# Patient Record
Sex: Female | Born: 1945
Health system: Southern US, Community
[De-identification: ages and names within clinical notes are randomized; demographics above are authoritative.]

## PROBLEM LIST (undated history)

## (undated) DIAGNOSIS — D509 Iron deficiency anemia, unspecified: Secondary | ICD-10-CM

## (undated) DIAGNOSIS — F419 Anxiety disorder, unspecified: Secondary | ICD-10-CM

## (undated) DIAGNOSIS — K589 Irritable bowel syndrome without diarrhea: Secondary | ICD-10-CM

## (undated) DIAGNOSIS — M1990 Unspecified osteoarthritis, unspecified site: Secondary | ICD-10-CM

## (undated) DIAGNOSIS — K219 Gastro-esophageal reflux disease without esophagitis: Secondary | ICD-10-CM

## (undated) DIAGNOSIS — Z960 Presence of urogenital implants: Secondary | ICD-10-CM

## (undated) DIAGNOSIS — I10 Essential (primary) hypertension: Secondary | ICD-10-CM

## (undated) DIAGNOSIS — Z8249 Family history of ischemic heart disease and other diseases of the circulatory system: Secondary | ICD-10-CM

## (undated) HISTORY — PX: TUBAL LIGATION: SHX77

## (undated) HISTORY — DX: Iron deficiency anemia, unspecified: D50.9

## (undated) HISTORY — DX: Gastro-esophageal reflux disease without esophagitis: K21.9

## (undated) HISTORY — DX: Essential (primary) hypertension: I10

## (undated) HISTORY — DX: Anxiety disorder, unspecified: F41.9

## (undated) HISTORY — PX: CHOLECYSTECTOMY: SHX55

## (undated) HISTORY — DX: Unspecified osteoarthritis, unspecified site: M19.90

## (undated) HISTORY — DX: Irritable bowel syndrome, unspecified: K58.9

## (undated) HISTORY — DX: Family history of ischemic heart disease and other diseases of the circulatory system: Z82.49

## (undated) HISTORY — PX: APPENDECTOMY: SHX54

---

## 1999-10-20 ENCOUNTER — Ambulatory Visit (HOSPITAL_COMMUNITY): Admission: RE | Admit: 1999-10-20 | Discharge: 1999-10-20 | Payer: Self-pay | Admitting: Internal Medicine

## 1999-10-20 ENCOUNTER — Encounter: Payer: Self-pay | Admitting: Internal Medicine

## 2000-06-07 ENCOUNTER — Encounter (INDEPENDENT_AMBULATORY_CARE_PROVIDER_SITE_OTHER): Payer: Self-pay

## 2000-06-07 ENCOUNTER — Other Ambulatory Visit: Admission: RE | Admit: 2000-06-07 | Discharge: 2000-06-07 | Payer: Self-pay | Admitting: Gastroenterology

## 2002-02-07 ENCOUNTER — Encounter: Admission: RE | Admit: 2002-02-07 | Discharge: 2002-02-07 | Payer: Self-pay | Admitting: Internal Medicine

## 2002-02-07 ENCOUNTER — Encounter: Payer: Self-pay | Admitting: Internal Medicine

## 2002-04-10 ENCOUNTER — Other Ambulatory Visit: Admission: RE | Admit: 2002-04-10 | Discharge: 2002-04-10 | Payer: Self-pay | Admitting: Obstetrics and Gynecology

## 2003-09-26 ENCOUNTER — Other Ambulatory Visit: Admission: RE | Admit: 2003-09-26 | Discharge: 2003-09-26 | Payer: Self-pay | Admitting: Obstetrics and Gynecology

## 2004-07-15 ENCOUNTER — Ambulatory Visit: Payer: Self-pay | Admitting: Gastroenterology

## 2005-07-16 ENCOUNTER — Ambulatory Visit: Payer: Self-pay | Admitting: Internal Medicine

## 2005-07-24 ENCOUNTER — Other Ambulatory Visit: Admission: RE | Admit: 2005-07-24 | Discharge: 2005-07-24 | Payer: Self-pay | Admitting: Obstetrics and Gynecology

## 2005-08-11 ENCOUNTER — Ambulatory Visit: Payer: Self-pay | Admitting: Gastroenterology

## 2005-08-21 ENCOUNTER — Ambulatory Visit: Payer: Self-pay | Admitting: Gastroenterology

## 2005-08-22 ENCOUNTER — Encounter: Payer: Self-pay | Admitting: Gastroenterology

## 2005-08-22 ENCOUNTER — Ambulatory Visit (HOSPITAL_COMMUNITY): Admission: RE | Admit: 2005-08-22 | Discharge: 2005-08-22 | Payer: Self-pay | Admitting: Radiology

## 2006-09-02 ENCOUNTER — Ambulatory Visit: Payer: Self-pay | Admitting: Gastroenterology

## 2006-09-02 LAB — CONVERTED CEMR LAB
Eosinophils Absolute: 0.1 10*3/uL (ref 0.0–0.6)
HCT: 35.9 % — ABNORMAL LOW (ref 36.0–46.0)
Hemoglobin: 12.5 g/dL (ref 12.0–15.0)
INR: 1 (ref 0.9–2.0)
Iron: 64 ug/dL (ref 42–145)
Lymphocytes Relative: 33.2 % (ref 12.0–46.0)
Monocytes Absolute: 0.6 10*3/uL (ref 0.2–0.7)
Neutro Abs: 3 10*3/uL (ref 1.4–7.7)
Neutrophils Relative %: 54.1 % (ref 43.0–77.0)
Prothrombin Time: 12.1 s (ref 10.0–14.0)
RBC: 4.24 M/uL (ref 3.87–5.11)
WBC: 5.6 10*3/uL (ref 4.5–10.5)

## 2006-09-24 ENCOUNTER — Ambulatory Visit: Payer: Self-pay | Admitting: Internal Medicine

## 2006-09-24 LAB — CONVERTED CEMR LAB
ALT: 19 units/L (ref 0–40)
Albumin: 3.7 g/dL (ref 3.5–5.2)
Alkaline Phosphatase: 61 units/L (ref 39–117)
BUN: 18 mg/dL (ref 6–23)
Bilirubin Urine: NEGATIVE
Bilirubin, Direct: 0.1 mg/dL (ref 0.0–0.3)
CO2: 31 meq/L (ref 19–32)
Chloride: 107 meq/L (ref 96–112)
Creatinine, Ser: 0.9 mg/dL (ref 0.4–1.2)
Eosinophils Absolute: 0.1 10*3/uL (ref 0.0–0.6)
HCT: 37.7 % (ref 36.0–46.0)
Hemoglobin: 12.7 g/dL (ref 12.0–15.0)
Lymphocytes Relative: 36.8 % (ref 12.0–46.0)
MCHC: 33.8 g/dL (ref 30.0–36.0)
MCV: 86.8 fL (ref 78.0–100.0)
Monocytes Absolute: 0.5 10*3/uL (ref 0.2–0.7)
Neutro Abs: 2.2 10*3/uL (ref 1.4–7.7)
Neutrophils Relative %: 45.4 % (ref 43.0–77.0)
Potassium: 4.9 meq/L (ref 3.5–5.1)
RBC / HPF: NONE SEEN
Sodium: 142 meq/L (ref 135–145)
TSH: 4.97 microintl units/mL (ref 0.35–5.50)
Total Bilirubin: 0.7 mg/dL (ref 0.3–1.2)
Total Protein: 6.9 g/dL (ref 6.0–8.3)
Triglycerides: 52 mg/dL (ref 0–149)
WBC: 4.7 10*3/uL (ref 4.5–10.5)

## 2006-09-28 ENCOUNTER — Ambulatory Visit: Payer: Self-pay | Admitting: Internal Medicine

## 2007-04-06 ENCOUNTER — Ambulatory Visit: Payer: Self-pay | Admitting: Internal Medicine

## 2007-04-06 ENCOUNTER — Encounter: Payer: Self-pay | Admitting: Internal Medicine

## 2007-04-06 DIAGNOSIS — R079 Chest pain, unspecified: Secondary | ICD-10-CM

## 2007-04-06 DIAGNOSIS — M1990 Unspecified osteoarthritis, unspecified site: Secondary | ICD-10-CM | POA: Insufficient documentation

## 2007-04-06 DIAGNOSIS — K589 Irritable bowel syndrome without diarrhea: Secondary | ICD-10-CM

## 2007-04-06 DIAGNOSIS — K219 Gastro-esophageal reflux disease without esophagitis: Secondary | ICD-10-CM | POA: Insufficient documentation

## 2007-04-06 DIAGNOSIS — M545 Low back pain, unspecified: Secondary | ICD-10-CM | POA: Insufficient documentation

## 2007-04-06 DIAGNOSIS — D509 Iron deficiency anemia, unspecified: Secondary | ICD-10-CM | POA: Insufficient documentation

## 2007-04-06 DIAGNOSIS — M179 Osteoarthritis of knee, unspecified: Secondary | ICD-10-CM | POA: Insufficient documentation

## 2007-04-06 DIAGNOSIS — D649 Anemia, unspecified: Secondary | ICD-10-CM

## 2007-04-06 DIAGNOSIS — M76899 Other specified enthesopathies of unspecified lower limb, excluding foot: Secondary | ICD-10-CM | POA: Insufficient documentation

## 2007-04-14 ENCOUNTER — Ambulatory Visit: Payer: Self-pay

## 2007-04-29 ENCOUNTER — Telehealth: Payer: Self-pay | Admitting: Internal Medicine

## 2007-05-18 ENCOUNTER — Telehealth: Payer: Self-pay | Admitting: Internal Medicine

## 2007-05-20 ENCOUNTER — Ambulatory Visit: Payer: Self-pay | Admitting: Internal Medicine

## 2007-05-20 DIAGNOSIS — M542 Cervicalgia: Secondary | ICD-10-CM

## 2007-05-20 DIAGNOSIS — M5412 Radiculopathy, cervical region: Secondary | ICD-10-CM | POA: Insufficient documentation

## 2007-05-20 DIAGNOSIS — R7309 Other abnormal glucose: Secondary | ICD-10-CM

## 2007-05-20 LAB — CONVERTED CEMR LAB
Chloride: 108 meq/L (ref 96–112)
GFR calc Af Amer: 82 mL/min
GFR calc non Af Amer: 68 mL/min
Glucose, Bld: 101 mg/dL — ABNORMAL HIGH (ref 70–99)
Hgb A1c MFr Bld: 5.7 % (ref 4.6–6.0)
Sodium: 141 meq/L (ref 135–145)
Vit D, 1,25-Dihydroxy: 37 (ref 30–89)

## 2007-05-24 ENCOUNTER — Ambulatory Visit: Payer: Self-pay | Admitting: Internal Medicine

## 2007-05-24 ENCOUNTER — Telehealth: Payer: Self-pay | Admitting: Internal Medicine

## 2007-05-24 DIAGNOSIS — I10 Essential (primary) hypertension: Secondary | ICD-10-CM | POA: Insufficient documentation

## 2007-05-24 DIAGNOSIS — R209 Unspecified disturbances of skin sensation: Secondary | ICD-10-CM

## 2007-06-10 ENCOUNTER — Telehealth: Payer: Self-pay | Admitting: Internal Medicine

## 2007-07-11 ENCOUNTER — Ambulatory Visit: Payer: Self-pay | Admitting: Internal Medicine

## 2007-07-11 DIAGNOSIS — R071 Chest pain on breathing: Secondary | ICD-10-CM

## 2007-08-22 ENCOUNTER — Telehealth: Payer: Self-pay | Admitting: Internal Medicine

## 2007-08-23 ENCOUNTER — Encounter: Payer: Self-pay | Admitting: Internal Medicine

## 2007-09-09 ENCOUNTER — Telehealth: Payer: Self-pay | Admitting: Internal Medicine

## 2007-09-09 DIAGNOSIS — E049 Nontoxic goiter, unspecified: Secondary | ICD-10-CM | POA: Insufficient documentation

## 2007-09-12 ENCOUNTER — Telehealth: Payer: Self-pay | Admitting: Internal Medicine

## 2007-09-15 ENCOUNTER — Ambulatory Visit: Payer: Self-pay | Admitting: Internal Medicine

## 2007-09-15 LAB — CONVERTED CEMR LAB
Chloride: 105 meq/L (ref 96–112)
Creatinine, Ser: 0.8 mg/dL (ref 0.4–1.2)
GFR calc Af Amer: 94 mL/min
GFR calc non Af Amer: 78 mL/min
Potassium: 4.4 meq/L (ref 3.5–5.1)
Sed Rate: 28 mm/hr — ABNORMAL HIGH (ref 0–25)
Sodium: 139 meq/L (ref 135–145)
Vitamin B-12: 508 pg/mL (ref 211–911)

## 2007-09-21 ENCOUNTER — Ambulatory Visit: Payer: Self-pay | Admitting: Internal Medicine

## 2007-09-21 ENCOUNTER — Ambulatory Visit: Payer: Self-pay | Admitting: Endocrinology

## 2007-09-21 DIAGNOSIS — R413 Other amnesia: Secondary | ICD-10-CM | POA: Insufficient documentation

## 2007-09-28 ENCOUNTER — Encounter: Admission: RE | Admit: 2007-09-28 | Discharge: 2007-09-28 | Payer: Self-pay | Admitting: Endocrinology

## 2007-09-28 ENCOUNTER — Telehealth: Payer: Self-pay | Admitting: Internal Medicine

## 2007-09-29 ENCOUNTER — Ambulatory Visit: Payer: Self-pay | Admitting: Internal Medicine

## 2007-09-29 LAB — CONVERTED CEMR LAB
Basophils Absolute: 0.1 10*3/uL (ref 0.0–0.1)
Eosinophils Relative: 2.2 % (ref 0.0–5.0)
HCT: 31.4 % — ABNORMAL LOW (ref 36.0–46.0)
Hemoglobin: 9.7 g/dL — ABNORMAL LOW (ref 12.0–15.0)
INR: 0.9 (ref 0.8–1.0)
MCHC: 30.9 g/dL (ref 30.0–36.0)
MCV: 83.3 fL (ref 78.0–100.0)
Monocytes Relative: 9.3 % (ref 3.0–12.0)
Platelets: 396 10*3/uL (ref 150–400)
RDW: 15.8 % — ABNORMAL HIGH (ref 11.5–14.6)

## 2007-09-30 ENCOUNTER — Ambulatory Visit: Payer: Self-pay | Admitting: Internal Medicine

## 2007-09-30 DIAGNOSIS — R21 Rash and other nonspecific skin eruption: Secondary | ICD-10-CM | POA: Insufficient documentation

## 2007-10-13 ENCOUNTER — Ambulatory Visit: Payer: Self-pay | Admitting: Gastroenterology

## 2007-10-14 ENCOUNTER — Encounter: Payer: Self-pay | Admitting: Internal Medicine

## 2007-10-21 ENCOUNTER — Encounter: Payer: Self-pay | Admitting: Internal Medicine

## 2007-11-07 ENCOUNTER — Ambulatory Visit: Payer: Self-pay | Admitting: Internal Medicine

## 2007-11-09 ENCOUNTER — Ambulatory Visit: Payer: Self-pay | Admitting: Internal Medicine

## 2007-11-28 ENCOUNTER — Ambulatory Visit: Payer: Self-pay | Admitting: Internal Medicine

## 2007-11-29 LAB — CONVERTED CEMR LAB
Basophils Absolute: 0 10*3/uL (ref 0.0–0.1)
Basophils Relative: 0.3 % (ref 0.0–1.0)
CO2: 29 meq/L (ref 19–32)
Calcium: 9.2 mg/dL (ref 8.4–10.5)
Eosinophils Relative: 2.1 % (ref 0.0–5.0)
GFR calc Af Amer: 82 mL/min
Hemoglobin: 13 g/dL (ref 12.0–15.0)
Iron: 89 ug/dL (ref 42–145)
MCHC: 33.3 g/dL (ref 30.0–36.0)
MCV: 86.2 fL (ref 78.0–100.0)
Neutro Abs: 2.8 10*3/uL (ref 1.4–7.7)
Potassium: 4.7 meq/L (ref 3.5–5.1)
Saturation Ratios: 21.5 % (ref 20.0–50.0)
Sodium: 139 meq/L (ref 135–145)
WBC: 5.3 10*3/uL (ref 4.5–10.5)

## 2008-03-20 ENCOUNTER — Telehealth: Payer: Self-pay | Admitting: Internal Medicine

## 2008-03-21 ENCOUNTER — Ambulatory Visit: Payer: Self-pay | Admitting: Internal Medicine

## 2008-04-10 ENCOUNTER — Telehealth: Payer: Self-pay | Admitting: Gastroenterology

## 2008-06-19 ENCOUNTER — Ambulatory Visit: Payer: Self-pay | Admitting: Internal Medicine

## 2008-10-08 ENCOUNTER — Ambulatory Visit: Payer: Self-pay | Admitting: Internal Medicine

## 2008-10-08 LAB — CONVERTED CEMR LAB
BUN: 16 mg/dL (ref 6–23)
Bilirubin, Direct: 0.1 mg/dL (ref 0.0–0.3)
Calcium: 8.8 mg/dL (ref 8.4–10.5)
Chloride: 109 meq/L (ref 96–112)
GFR calc non Af Amer: 67.46 mL/min (ref >60)
Sodium: 145 meq/L (ref 135–145)
TSH: 2.23 microintl units/mL (ref 0.35–5.50)
Total Bilirubin: 0.5 mg/dL (ref 0.3–1.2)

## 2008-10-15 ENCOUNTER — Ambulatory Visit: Payer: Self-pay | Admitting: Internal Medicine

## 2009-01-21 ENCOUNTER — Telehealth: Payer: Self-pay | Admitting: Internal Medicine

## 2009-01-23 ENCOUNTER — Telehealth: Payer: Self-pay | Admitting: Internal Medicine

## 2009-02-07 ENCOUNTER — Encounter: Payer: Self-pay | Admitting: Internal Medicine

## 2009-02-21 ENCOUNTER — Ambulatory Visit: Payer: Self-pay | Admitting: Internal Medicine

## 2009-02-24 DIAGNOSIS — F419 Anxiety disorder, unspecified: Secondary | ICD-10-CM | POA: Insufficient documentation

## 2009-02-24 DIAGNOSIS — F411 Generalized anxiety disorder: Secondary | ICD-10-CM

## 2009-04-09 ENCOUNTER — Ambulatory Visit: Payer: Self-pay | Admitting: Gastroenterology

## 2009-04-09 DIAGNOSIS — R0789 Other chest pain: Secondary | ICD-10-CM

## 2009-04-09 DIAGNOSIS — R1013 Epigastric pain: Secondary | ICD-10-CM

## 2009-04-09 DIAGNOSIS — R197 Diarrhea, unspecified: Secondary | ICD-10-CM

## 2009-04-09 LAB — CONVERTED CEMR LAB
ALT: 37 units/L — ABNORMAL HIGH (ref 0–35)
AST: 30 units/L (ref 0–37)
Albumin: 3.8 g/dL (ref 3.5–5.2)
Alkaline Phosphatase: 47 units/L (ref 39–117)
BUN: 11 mg/dL (ref 6–23)
Basophils Absolute: 0 10*3/uL (ref 0.0–0.1)
Chloride: 101 meq/L (ref 96–112)
Eosinophils Absolute: 0.1 10*3/uL (ref 0.0–0.7)
GFR calc non Af Amer: 77.15 mL/min (ref >60)
HCT: 35 % — ABNORMAL LOW (ref 36.0–46.0)
Hemoglobin: 11.9 g/dL — ABNORMAL LOW (ref 12.0–15.0)
Lymphs Abs: 1.3 10*3/uL (ref 0.7–4.0)
MCHC: 33.8 g/dL (ref 30.0–36.0)
MCV: 95.3 fL (ref 78.0–100.0)
Monocytes Absolute: 0.7 10*3/uL (ref 0.1–1.0)
Neutrophils Relative %: 69.7 % (ref 43.0–77.0)
Sed Rate: 14 mm/hr (ref 0–22)
Sodium: 141 meq/L (ref 135–145)
TSH: 3.52 microintl units/mL (ref 0.35–5.50)
Total Bilirubin: 0.5 mg/dL (ref 0.3–1.2)

## 2009-04-10 ENCOUNTER — Encounter: Payer: Self-pay | Admitting: Gastroenterology

## 2009-04-10 ENCOUNTER — Ambulatory Visit (HOSPITAL_COMMUNITY): Admission: RE | Admit: 2009-04-10 | Discharge: 2009-04-10 | Payer: Self-pay | Admitting: Gastroenterology

## 2009-04-10 ENCOUNTER — Ambulatory Visit: Payer: Self-pay | Admitting: Gastroenterology

## 2009-04-10 LAB — CONVERTED CEMR LAB: UREASE: NEGATIVE

## 2009-04-11 ENCOUNTER — Telehealth: Payer: Self-pay | Admitting: Gastroenterology

## 2009-04-11 DIAGNOSIS — R935 Abnormal findings on diagnostic imaging of other abdominal regions, including retroperitoneum: Secondary | ICD-10-CM

## 2009-04-12 ENCOUNTER — Telehealth: Payer: Self-pay | Admitting: Gastroenterology

## 2009-04-15 ENCOUNTER — Encounter: Payer: Self-pay | Admitting: Gastroenterology

## 2009-04-16 ENCOUNTER — Ambulatory Visit (HOSPITAL_COMMUNITY): Admission: RE | Admit: 2009-04-16 | Discharge: 2009-04-16 | Payer: Self-pay | Admitting: Gastroenterology

## 2009-04-19 ENCOUNTER — Telehealth: Payer: Self-pay | Admitting: Gastroenterology

## 2009-05-28 ENCOUNTER — Ambulatory Visit: Payer: Self-pay | Admitting: Gastroenterology

## 2009-05-29 ENCOUNTER — Ambulatory Visit: Payer: Self-pay | Admitting: Internal Medicine

## 2009-06-29 HISTORY — PX: CERVICAL FUSION: SHX112

## 2009-07-01 ENCOUNTER — Telehealth: Payer: Self-pay | Admitting: Gastroenterology

## 2009-07-02 ENCOUNTER — Ambulatory Visit: Payer: Self-pay | Admitting: Internal Medicine

## 2009-07-02 DIAGNOSIS — R279 Unspecified lack of coordination: Secondary | ICD-10-CM | POA: Insufficient documentation

## 2009-07-02 DIAGNOSIS — R5383 Other fatigue: Secondary | ICD-10-CM

## 2009-07-02 DIAGNOSIS — Z87891 Personal history of nicotine dependence: Secondary | ICD-10-CM | POA: Insufficient documentation

## 2009-07-02 DIAGNOSIS — R5381 Other malaise: Secondary | ICD-10-CM | POA: Insufficient documentation

## 2009-07-02 LAB — CONVERTED CEMR LAB
Albumin ELP: 60.5 % (ref 55.8–66.1)
Beta Globulin: 7.1 % (ref 4.7–7.2)
Gamma Globulin: 14.8 % (ref 11.1–18.8)
Vit D, 25-Hydroxy: 42 ng/mL (ref 30–89)

## 2009-07-03 LAB — CONVERTED CEMR LAB
ALT: 23 units/L (ref 0–35)
AST: 28 units/L (ref 0–37)
Albumin: 3.9 g/dL (ref 3.5–5.2)
Alkaline Phosphatase: 57 units/L (ref 39–117)
BUN: 15 mg/dL (ref 6–23)
Basophils Absolute: 0.1 10*3/uL (ref 0.0–0.1)
Basophils Relative: 1.4 % (ref 0.0–3.0)
Bilirubin Urine: NEGATIVE
Bilirubin, Direct: 0.1 mg/dL (ref 0.0–0.3)
CO2: 29 meq/L (ref 19–32)
CRP, High Sensitivity: 2.5 (ref 0.00–5.00)
Calcium: 9 mg/dL (ref 8.4–10.5)
Chloride: 105 meq/L (ref 96–112)
Creatinine, Ser: 0.8 mg/dL (ref 0.4–1.2)
Eosinophils Absolute: 0.1 10*3/uL (ref 0.0–0.7)
Eosinophils Relative: 2.4 % (ref 0.0–5.0)
GFR calc non Af Amer: 77.1 mL/min (ref >60)
Glucose, Bld: 101 mg/dL — ABNORMAL HIGH (ref 70–99)
HCT: 40.1 % (ref 36.0–46.0)
Hemoglobin, Urine: NEGATIVE
Hemoglobin: 13.2 g/dL (ref 12.0–15.0)
Ketones, ur: NEGATIVE mg/dL
Leukocytes, UA: NEGATIVE
Lymphocytes Relative: 33.2 % (ref 12.0–46.0)
Lymphs Abs: 1.5 10*3/uL (ref 0.7–4.0)
MCHC: 32.8 g/dL (ref 30.0–36.0)
MCV: 93.9 fL (ref 78.0–100.0)
Monocytes Absolute: 0.4 10*3/uL (ref 0.1–1.0)
Monocytes Relative: 8 % (ref 3.0–12.0)
Neutro Abs: 2.5 10*3/uL (ref 1.4–7.7)
Neutrophils Relative %: 55 % (ref 43.0–77.0)
Nitrite: NEGATIVE
Platelets: 267 10*3/uL (ref 150.0–400.0)
Potassium: 4.3 meq/L (ref 3.5–5.1)
RBC: 4.27 M/uL (ref 3.87–5.11)
RDW: 14.1 % (ref 11.5–14.6)
Sed Rate: 13 mm/hr (ref 0–22)
Sodium: 141 meq/L (ref 135–145)
Specific Gravity, Urine: 1.02 (ref 1.000–1.030)
TSH: 3.73 microintl units/mL (ref 0.35–5.50)
Total Bilirubin: 0.7 mg/dL (ref 0.3–1.2)
Total Protein, Urine: NEGATIVE mg/dL
Total Protein: 7.6 g/dL (ref 6.0–8.3)
Urine Glucose: NEGATIVE mg/dL
Urobilinogen, UA: 0.2 (ref 0.0–1.0)
Vitamin B-12: 639 pg/mL (ref 211–911)
WBC: 4.6 10*3/uL (ref 4.5–10.5)
pH: 5.5 (ref 5.0–8.0)

## 2009-07-15 ENCOUNTER — Telehealth: Payer: Self-pay | Admitting: Internal Medicine

## 2009-07-15 ENCOUNTER — Ambulatory Visit: Payer: Self-pay | Admitting: Internal Medicine

## 2009-07-15 DIAGNOSIS — R609 Edema, unspecified: Secondary | ICD-10-CM

## 2009-07-15 LAB — CONVERTED CEMR LAB
Cortisol, Plasma: 8.9 ug/dL
Total CK: 133 units/L (ref 7–177)

## 2009-07-25 ENCOUNTER — Ambulatory Visit (HOSPITAL_COMMUNITY): Admission: RE | Admit: 2009-07-25 | Discharge: 2009-07-25 | Payer: Self-pay | Admitting: Internal Medicine

## 2009-07-25 ENCOUNTER — Telehealth: Payer: Self-pay | Admitting: Internal Medicine

## 2009-07-26 ENCOUNTER — Telehealth: Payer: Self-pay | Admitting: Internal Medicine

## 2009-07-26 DIAGNOSIS — G959 Disease of spinal cord, unspecified: Secondary | ICD-10-CM | POA: Insufficient documentation

## 2009-07-30 ENCOUNTER — Encounter (INDEPENDENT_AMBULATORY_CARE_PROVIDER_SITE_OTHER): Payer: Self-pay | Admitting: *Deleted

## 2009-07-31 ENCOUNTER — Encounter: Admission: RE | Admit: 2009-07-31 | Discharge: 2009-07-31 | Payer: Self-pay | Admitting: Orthopedic Surgery

## 2009-08-06 ENCOUNTER — Encounter (INDEPENDENT_AMBULATORY_CARE_PROVIDER_SITE_OTHER): Payer: Self-pay | Admitting: Orthopedic Surgery

## 2009-08-06 ENCOUNTER — Inpatient Hospital Stay (HOSPITAL_COMMUNITY): Admission: RE | Admit: 2009-08-06 | Discharge: 2009-08-07 | Payer: Self-pay | Admitting: Orthopedic Surgery

## 2009-08-12 ENCOUNTER — Telehealth: Payer: Self-pay | Admitting: Internal Medicine

## 2009-08-27 ENCOUNTER — Ambulatory Visit: Payer: Self-pay | Admitting: Internal Medicine

## 2009-10-10 ENCOUNTER — Telehealth: Payer: Self-pay | Admitting: Internal Medicine

## 2009-12-05 ENCOUNTER — Ambulatory Visit: Payer: Self-pay | Admitting: Internal Medicine

## 2009-12-05 LAB — CONVERTED CEMR LAB
Basophils Absolute: 0 10*3/uL (ref 0.0–0.1)
Basophils Relative: 0.7 % (ref 0.0–3.0)
Creatinine, Ser: 0.9 mg/dL (ref 0.4–1.2)
Eosinophils Absolute: 0.2 10*3/uL (ref 0.0–0.7)
Eosinophils Relative: 3.3 % (ref 0.0–5.0)
GFR calc non Af Amer: 64.71 mL/min (ref >60)
HCT: 38.5 % (ref 36.0–46.0)
Hemoglobin: 13.3 g/dL (ref 12.0–15.0)
MCHC: 34.4 g/dL (ref 30.0–36.0)
Monocytes Absolute: 0.5 10*3/uL (ref 0.1–1.0)
Neutrophils Relative %: 54.6 % (ref 43.0–77.0)
Platelets: 246 10*3/uL (ref 150.0–400.0)
RDW: 13.9 % (ref 11.5–14.6)
Sodium: 142 meq/L (ref 135–145)
WBC: 4.7 10*3/uL (ref 4.5–10.5)

## 2009-12-09 ENCOUNTER — Ambulatory Visit: Payer: Self-pay | Admitting: Internal Medicine

## 2010-04-17 ENCOUNTER — Telehealth: Payer: Self-pay | Admitting: Internal Medicine

## 2010-05-06 ENCOUNTER — Ambulatory Visit: Payer: Self-pay | Admitting: Internal Medicine

## 2010-07-01 ENCOUNTER — Ambulatory Visit
Admission: RE | Admit: 2010-07-01 | Discharge: 2010-07-01 | Payer: Self-pay | Source: Home / Self Care | Attending: Internal Medicine | Admitting: Internal Medicine

## 2010-07-01 LAB — CONVERTED CEMR LAB
AST: 24 units/L (ref 0–37)
Albumin: 3.8 g/dL (ref 3.5–5.2)
Alkaline Phosphatase: 56 units/L (ref 39–117)
Basophils Absolute: 0 10*3/uL (ref 0.0–0.1)
Bilirubin, Direct: 0.1 mg/dL (ref 0.0–0.3)
Blood, UA: NEGATIVE
CO2: 28 meq/L (ref 19–32)
Chloride: 105 meq/L (ref 96–112)
Eosinophils Absolute: 0.1 10*3/uL (ref 0.0–0.7)
GFR calc non Af Amer: 79.13 mL/min (ref >60.00)
Glucose, Bld: 90 mg/dL (ref 70–99)
HCT: 39 % (ref 36.0–46.0)
HDL: 63.8 mg/dL (ref >39.00)
LDL Cholesterol: 110 mg/dL — ABNORMAL HIGH (ref 0–99)
Lymphocytes Relative: 33.4 % (ref 12.0–46.0)
Lymphs Abs: 1.7 10*3/uL (ref 0.7–4.0)
MCHC: 34.3 g/dL (ref 30.0–36.0)
MCV: 93.5 fL (ref 78.0–100.0)
Monocytes Absolute: 0.4 10*3/uL (ref 0.1–1.0)
Nitrite: NEGATIVE
Platelets: 279 10*3/uL (ref 150.0–400.0)
Potassium: 4.6 meq/L (ref 3.5–5.1)
Sodium: 141 meq/L (ref 135–145)
Total Protein: 6.5 g/dL (ref 6.0–8.3)
Urine Glucose: NEGATIVE mg/dL
Urobilinogen, UA: 0.2 (ref 0.0–1.0)
VLDL: 10.4 mg/dL (ref 0.0–40.0)
Vitamin B-12: 600 pg/mL (ref 211–911)

## 2010-07-07 ENCOUNTER — Encounter: Payer: Self-pay | Admitting: Internal Medicine

## 2010-07-08 ENCOUNTER — Encounter: Payer: Self-pay | Admitting: Internal Medicine

## 2010-07-08 ENCOUNTER — Ambulatory Visit
Admission: RE | Admit: 2010-07-08 | Discharge: 2010-07-08 | Payer: Self-pay | Source: Home / Self Care | Attending: Internal Medicine | Admitting: Internal Medicine

## 2010-07-20 ENCOUNTER — Encounter: Payer: Self-pay | Admitting: Gastroenterology

## 2010-07-21 ENCOUNTER — Encounter: Payer: Self-pay | Admitting: Endocrinology

## 2010-07-29 NOTE — Progress Notes (Signed)
Summary: [facility] ?  Phone Note Other Incoming   Caller: [facility] Summary of Call: Pt is currently taking Enalapril 5mg  1 tab two times a day . Is it ok for her to chage to Enalapril 10mg  once a day. This would be a cheaper copay for the pt. Please Advise Initial call taken by: Ami Bullins CMA,  October 10, 2009 11:41 AM  Follow-up for Phone Call        OK w/me if it would work for her Follow-up by: Tresa Garter MD,  October 10, 2009 3:17 PM  Additional Follow-up for Phone Call Additional follow up Details #1::        Spoke w/pt, she takes 5mg  once daily. Only was on two times a day for short time after surgery. She wants rx only for 5mg  once daily. [facility] Additional Follow-up by: Lamar Sprinkles, CMA,  October 10, 2009 6:15 PM    Prescriptions: ENALAPRIL MALEATE 5 MG TABS (ENALAPRIL MALEATE) 1 by mouth qd  #90 x 1   Entered by:   Lamar Sprinkles, CMA   Authorized by:   Tresa Garter MD   Signed by:   Lamar Sprinkles, CMA on 10/10/2009   Method used:   Faxed to ...       [facility] (mail[facility]order)             ,          Ph: [phone]       Fax: [phone]   RxID:   [id number]

## 2010-07-29 NOTE — Letter (Signed)
Summary: [facility] Consult Scheduled Letter  [facility]  [address] [place], [place] [address]   Phone: [phone]  Fax: [phone]      07/30/2009 MRN: [id number]  Haley Armstrong [address] [place], [place]  [address]    Dear [name],      We have scheduled an appointment for you.  At the recommendation of Dr.plotnikov, we have scheduled you a consult with Dr Vickey Huger on Febuary 25,2011  at 12:00. Their phone number is [phone].If this appointment day and time is not convenient for you, please feel free to call the office of the doctor you are being referred to at the number listed above and reschedule the appointment.  ***Please arrive 30 Minutes prior to your appointment time *** *Due to an increase in missed appt for both and new and return patients , a $25 rescheduling fee will be charged for appointments that have not been cancelled within 24hrs of the scheduling time.In order to reschedule the appointmentthe fee must be paid. if you are unable to keep your scheduled appointment for any reason , please contact [facility] at [phone]).  If you have had an MRI and CT Scan .Marland KitchenPlease bring the films/Cd to the Appointment, otherwise the Appointment may have to be rescheduled .   [facility]  [address], [address],  [place], [place] [address]   Thank you,  Patient Care Coordinator [facility]

## 2010-07-29 NOTE — Assessment & Plan Note (Signed)
Summary: 3 MONTH FOLLOW UP-LB   Vital Signs:  Patient profile:   65 year old female Height:      67 inches Weight:      152 pounds BMI:     23.89 O2 Sat:      98 % on Room air Temp:     97.7 degrees F oral Pulse rate:   84 / minute BP sitting:   150 / 70  (left arm) Cuff size:   regular  Vitals Entered By: Lucious Groves (December 09, 2009 9:16 AM)  O2 Flow:  Room air CC: 3 mo f/u./kb Is Patient Diabetic? No Pain Assessment Patient in pain? no      Comments Patient notes that she does not use Restasis and Dexamethasone./kb   Primary Care Provider:  Jacinta Shoe, MD   CC:  3 mo f/u./kb.  History of Present Illness: The patient presents for a follow up of hypertension, ataxia, GERD, hyperlipidemia   Current Medications (verified): 1)  Amlodipine Besylate 5 Mg Tabs (Amlodipine Besylate) .Marland Kitchen.. 1 By Mouth Qd 2)  Enalapril Maleate 5 Mg Tabs (Enalapril Maleate) .Marland Kitchen.. 1 By Mouth Qd 3)  Levbid 0.375 Mg Tb12 (Hyoscyamine Sulfate) .... Take 1 Tablet By Mouth Every Morning 4)  Levsin/sl 0.125 Mg Subl (Hyoscyamine Sulfate) .... One Tablet Under Tounge As Needed 5)  Skelaxin 800 Mg  Tabs (Metaxalone) .... Three Times A Day As Needed 6)  Fluocinonide-E 0.05 %  Crea (Fluocinonide Emulsified Base) .... As Directed 7)  Vitamin D3 1000 Unit  Tabs (Cholecalciferol) .... One Tablet By Mouth Once Daily 8)  Protonix 40 Mg  Tbec (Pantoprazole Sodium) .... Once Daily 9)  Lorazepam 0.5 Mg Tabs (Lorazepam) .Marland Kitchen.. 1 By Mouth Two Times A Day As Needed Anxiety 10)  Fish Oil   Oil (Fish Oil) .... One Tablet By Mouth Once Daily 11)  Citrucel 500 Mg Tabs (Methylcellulose (Laxative)) .... One Tablet By Mouth Once Daily 12)  Multivitamins   Tabs (Multiple Vitamin) .... One Tablet By Mouth Once Daily 13)  Viactiv Multi-Vitamin  Chew (Multiple Vitamins-Calcium) .... One Tablet By Mouth Two Times A Day 14)  Gi Cocktail- Equal Parts Maalox, Donnatal, Xylocaine .Marland Kitchen.. 1 Tbsp Q 6-7 Hrs As Needed 15)  Restasis 0.05  % Emul (Cyclosporine) .Marland Kitchen.. 1 Drop Both Eyes Two Times A Day 16)  Dexamethasone 4 Mg Tabs (Dexamethasone) .... 8 Mg Two Times A Day X 2 D, 4 Mg Two Times A Day X 7 D, 4 Mg Once Daily X 7 D Then Stop 17)  Epidrin 325-65-100 Mg Caps (Apap-Isometheptene-Dichloral) .Marland Kitchen.. 1 By Mouth Two Times A Day As Needed Migraine 18)  Aspir-Low 81 Mg Tbec (Aspirin) .... Two Times A Day  Allergies (verified): No Known Drug Allergies  Past History:  Past Medical History: Last updated: 02/21/2009 GERD Osteoarthritis Anemia-NOS Anemia-iron deficiency Hypertension Blood donor Colonosc. 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         BP is ok at home  Physical Exam  General:  Well-developed,well-nourished,in no acute distress; alert,appropriate and cooperative throughout examination Nose:  External nasal examination shows no deformity or inflammation. Nasal mucosa are pink and moist without lesions or exudates. Mouth:  Oral mucosa and oropharynx without lesions or exudates.  Teeth in good  repair. Lungs:  Normal respiratory effort, chest expands symmetrically. Lungs are clear to auscultation, no crackles or wheezes. Heart:  Normal rate and regular rhythm. S1 and S2 normal without gallop, murmur, click, rub or other extra sounds. Abdomen:  Bowel sounds positive,abdomen soft and non-tender without masses, organomegaly or hernias noted. Msk:  No deformity or scoliosis noted of thoracic or lumbar spine.   Extremities:  No clubbing, cyanosis, edema, or deformity noted with normal full range of motion of all joints.   Neurologic:  5-/5 UE large muscles  and thighs and  B grip Babinski equivocal B, unable to do heel-to-toe, ataxic gait. Romberg  neg Skin:  Intact without suspicious lesions or rashes Psych:  Cognition and judgment appear intact. Alert and cooperative with normal attention span and concentration. No apparent delusions, illusions, hallucinations   Impression & Recommendations:  Problem # 1:  SPINAL CORD COMPRESSION (ICD-336.9) Assessment Improved S/p surgery - doing well. Occ heavy lifting at work - she was discouraged from it.  Problem # 2:  ATAXIA (ICD-781.3) due to #1 Assessment: Improved  Problem # 3:  MEMORY LOSS (ICD-780.93) - minimal Assessment: Improved Discussed options/testing  Problem # 4:  HYPERTENSION (ICD-401.9) Assessment: Comment Only  Her updated medication list for this problem includes:    Amlodipine Besylate 5 Mg Tabs (Amlodipine besylate) .Marland Kitchen... 1 by mouth qd    Enalapril Maleate 5 Mg Tabs (Enalapril maleate) .Marland Kitchen... 1 by mouth qd  BP today: 150/70 Prior BP: 124/70 (08/27/2009)  Labs Reviewed: K+: 4.9 (12/05/2009) Creat: : 0.9 (12/05/2009)   Chol: 175 (09/24/2006)   HDL: 59.6 (09/24/2006)   LDL: 105 (09/24/2006)   TG: 52 (09/24/2006)  Complete Medication List: 1)  Amlodipine Besylate 5 Mg Tabs (Amlodipine besylate) .Marland Kitchen.. 1 by mouth qd 2)  Enalapril Maleate 5 Mg Tabs (Enalapril maleate) .Marland Kitchen.. 1 by mouth qd 3)  Levbid 0.375 Mg Tb12 (Hyoscyamine sulfate) .... Take 1 tablet by mouth every morning 4)  Levsin/sl 0.125 Mg Subl (Hyoscyamine sulfate) .... One tablet under tounge as needed 5)  Skelaxin 800 Mg Tabs (Metaxalone) .... Three times a day as needed 6)  Fluocinonide-e 0.05 % Crea (Fluocinonide emulsified base) .... As directed 7)  Vitamin D3 1000 Unit Tabs (Cholecalciferol) .... One tablet by mouth once daily 8)  Protonix 40 Mg Tbec (Pantoprazole sodium) .... Once daily 9)  Lorazepam 0.5 Mg Tabs (Lorazepam) .Marland Kitchen.. 1 by mouth two times a day as needed anxiety 10)  Fish Oil Oil (Fish oil) .... One tablet by mouth once daily 11)  Citrucel 500 Mg Tabs (Methylcellulose (laxative)) .... One  tablet by mouth once daily 12)  Multivitamins Tabs (Multiple vitamin) .... One tablet by mouth once daily 13)  Viactiv Multi-vitamin Chew (Multiple vitamins-calcium) .... One tablet by mouth two times a day 14)  Restasis 0.05 % Emul (Cyclosporine) .Marland Kitchen.. 1 drop both eyes two times a day 15)  Epidrin 325-65-100 Mg Caps (Apap-isometheptene-dichloral) .Marland Kitchen.. 1 by mouth two times a day as needed migraine 16)  Aspir-low 81 Mg Tbec (Aspirin) .... Two times a day 17)  Pennsaid 1.5 % Soln (Diclofenac sodium) .... 3-5 gtt on skin three times a day for pain  Patient Instructions: 1)  Please schedule a follow-up appointment in 6 months well w/labs v70.0 2)  and B12 782.0. Prescriptions: PENNSAID 1.5 % SOLN (DICLOFENAC SODIUM) 3-5 gtt on skin three times a day for pain  #1 x 3   Entered and Authorized by:   Tresa Garter MD   Signed by:  Tresa Garter MD on 12/09/2009   Method used:   Print then Give to Patient   RxID:   [id number]

## 2010-07-29 NOTE — Progress Notes (Signed)
Summary: lorazepam refill/plot pt  Phone Note Refill Request Message from:  Fax from Pharmacy on April 17, 2010 3:11 PM  Refills Requested: Medication #1:  LORAZEPAM 0.5 MG TABS 1 by mouth two times a day as needed anxiety   Supply Requested: 1 month   Notes: 1tab po bid prn   Is this ok to refill for pt? [facility]  Initial call taken by: Rock Nephew CMA,  April 17, 2010 3:12 PM  Follow-up for Phone Call        ok Follow-up by: Etta Grandchild MD,  April 17, 2010 3:34 PM    Prescriptions: LORAZEPAM 0.5 MG TABS (LORAZEPAM) 1 by mouth two times a day as needed anxiety  #60 x 3   Entered by:   Rock Nephew CMA   Authorized by:   Jacques Navy MD   Signed by:   Rock Nephew CMA on 04/18/2010   Method used:   Telephoned to ...       [facility] (retail)       [address] [place]       [address]       [place], [place]  [address]       Ph: [phone]       Fax: [phone]   RxID:   [id number]

## 2010-07-29 NOTE — Progress Notes (Signed)
  Phone Note Other Incoming   Caller: me Summary of Call: Informed of MRI results - doing about the same - she is at work Initial call taken by: Tresa Garter MD,  July 26, 2009 11:49 AM  Follow-up for Phone Call        Decadron To ER if worse. [facility] on Mon Follow-up by: Tresa Garter MD,  July 26, 2009 11:50 AM  New Problems: SPINAL CORD COMPRESSION (ICD-336.9)   New Problems: SPINAL CORD COMPRESSION (ICD-336.9) New/Updated Medications: DEXAMETHASONE 4 MG TABS (DEXAMETHASONE) 8 mg two times a day x 2 d, 4 mg two times a day x 7 d, 4 mg once daily x 7 d then stop EPIDRIN 325-65-100 MG CAPS (APAP-ISOMETHEPTENE-DICHLORAL) 1 by mouth two times a day as needed migraine  Appended Document:  Per patient request faxed a copy of report to her at Fax: [phone].

## 2010-07-29 NOTE — Progress Notes (Signed)
Summary: Midrin refill request  Phone Note Refill Request   Refills Requested: Medication #1:  Midrin 65-100-325mg    Supply Requested: 60   Notes: 1 po every hour prn for migraine (no more than 5 per day) *not on patient med list currently  Initial call taken by: Lucious Groves,  July 25, 2009 10:46 AM  Follow-up for Phone Call        OK x3 Follow-up by: Tresa Garter MD,  July 25, 2009 12:58 PM  Additional Follow-up for Phone Call Additional follow up Details #1::        Cant find med in EMR please help.....................Marland KitchenLamar Sprinkles, CMA  July 25, 2009 5:09 PM     Additional Follow-up for Phone Call Additional follow up Details #2::    OK a substitute pls Follow-up by: Tresa Garter MD,  July 26, 2009 7:40 AM  Additional Follow-up for Phone Call Additional follow up Details #3:: Details for Additional Follow-up Action Taken: Done - Epidrin Additional Follow-up by: Tresa Garter MD,  July 26, 2009 11:53 AM  Prescriptions: EPIDRIN 325-65-100 MG CAPS (APAP-ISOMETHEPTENE-DICHLORAL) 1 by mouth two times a day as needed migraine  #60 x 5   Entered by:   Lamar Sprinkles, CMA   Authorized by:   Tresa Garter MD   Signed by:   Lamar Sprinkles, CMA on 07/26/2009   Method used:   Telephoned to ...       [facility] (retail)       [address] [place]       [address]       [place], [place]  [address]       Ph: [phone]       Fax: [phone]   RxID:   [id number]  DR called in rx, above is to document in emr, pt is aware...........Lamar Sprinkles

## 2010-07-29 NOTE — Progress Notes (Signed)
Summary: RESULTS  Phone Note Call from Patient   Summary of Call: Pt had MRI yesterday at the hospital and was told by radiologist that she had a ruptured disc. Please advise.  Initial call taken by: Lamar Sprinkles, CMA,  July 26, 2009 8:24 AM  Follow-up for Phone Call        called - see oyher note Follow-up by: Tresa Garter MD,  July 26, 2009 11:54 AM

## 2010-07-29 NOTE — Assessment & Plan Note (Signed)
Summary: flu shot/plot/cd   Nurse Visit   Vital Signs:  Patient profile:   65 year old female Temp:     96.7 degrees F oral  Vitals Entered By: Lanier Prude, CMA(AAMA) (May 06, 2010 9:25 AM)  Allergies: No Known Drug Allergies  Orders Added: 1)  Admin 1st Vaccine [90471] 2)  Flu Vaccine 32yrs + [[address]]           Flu Vaccine Consent Questions     Do you have a history of severe allergic reactions to this vaccine? no    Any prior history of allergic reactions to egg and/or gelatin? no    Do you have a sensitivity to the preservative Thimersol? no    Do you have a past history of Guillan-Barre Syndrome? no    Do you currently have an acute febrile illness? no    Have you ever had a severe reaction to latex? no    Vaccine information given and explained to patient? yes    Are you currently pregnant? no    Lot Number:[id number]   Exp Date:12/27/2010   Site Given  Left Deltoid IM Lanier Prude, [facility])  May 06, 2010 9:26 AM

## 2010-07-29 NOTE — Assessment & Plan Note (Signed)
Summary: 3 mo rov /nws  $50   Vital Signs:  Patient profile:   65 year old female Weight:      151 pounds Temp:     97.7 degrees F oral Pulse rate:   78 / minute BP sitting:   136 / 76  (left arm)  Vitals Entered By: Tora Perches (July 02, 2009 9:31 AM) CC: f/u Is Patient Diabetic? No   Primary Care Provider:  Jacinta Shoe, MD   CC:  f/u.  History of Present Illness: C/o numbness in B arms x weeks. C/o weakness and clumsiness in LE. Feels "disjointed" worse lately x 1 mo. No falls. Saw Dr Jarold Motto and had an MRI for a kidney lesion  Preventive Screening-Counseling & Management  Alcohol-Tobacco     Smoking Status: quit  Allergies (verified): No Known Drug Allergies  Past History:  Past Medical History: Last updated: 02/21/2009 GERD Osteoarthritis Anemia-NOS Anemia-iron deficiency Hypertension Blood donor Colonosc. 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 Nasal mucosa are pink and moist without lesions or exudates. Mouth:  Oral mucosa and oropharynx without lesions or exudates.  Teeth in good repair. Neck:  No deformities, masses, or tenderness noted. Lungs:  Normal respiratory effort, chest expands symmetrically. Lungs are clear to auscultation, no crackles or wheezes. Heart:  Normal rate and regular rhythm. S1 and S2 normal without gallop, murmur, click, rub or other extra sounds. Abdomen:  Bowel sounds positive,abdomen soft and non-tender without masses, organomegaly or hernias noted. Msk:  No deformity or scoliosis noted of thoracic or lumbar spine.   Neurologic:  5-/5 UE large muscles  and  thighs and  B grip Babinski equivocal B ataxic gait. Romberg neg Skin:  Intact without suspicious lesions or rashes Cervical Nodes:  No lymphadenopathy noted Inguinal Nodes:  No significant adenopathy Psych:  Cognition and judgment appear intact. Alert and cooperative with normal attention span and concentration. No apparent delusions, illusions, hallucinations   Impression & Recommendations:  Problem # 1:  ATAXIA (ICD-781.3) Assessment New R/o MS, brain/spinal lesion etc The office visit took longer than 45 min with patient councelling for more than 50% of the 45 min  Orders: T-Vitamin D (25-Hydroxy) ([address]) T-Serum Protein Electrophoresis ([phone]) Radiology Referral (Radiology) MRI TLB-B12, Serum-Total ONLY ([address]-Z30) TLB-BMP (Basic Metabolic Panel-BMET) (80048-METABOL) TLB-CBC Platelet - w/Differential (85025-CBCD) TLB-Hepatic/Liver Function Pnl (80076-HEPATIC) TLB-CRP-High Sensitivity (C-Reactive Protein) (86140-FCRP) TLB-Sedimentation Rate (ESR) (85652-ESR) TLB-TSH (Thyroid Stimulating Hormone) (84443-TSH) TLB-Udip ONLY (81003-UDIP)  Problem # 2:  FATIGUE, ACUTE (ICD-780.79) Assessment: New  Orders: T-Vitamin D (25-Hydroxy) (86578-46962) T-Serum Protein Electrophoresis (95[address]-13244) Radiology Referral (Radiology) TLB-B12, Serum-Total ONLY (01027-O53) TLB-BMP (Basic Metabolic Panel-BMET) (80048-METABOL) TLB-CBC Platelet - w/Differential (85025-CBCD) TLB-Hepatic/Liver Function Pnl (80076-HEPATIC) TLB-CRP-High Sensitivity (C-Reactive Protein) (86140-FCRP) TLB-Sedimentation Rate (ESR) (85652-ESR) TLB-TSH (Thyroid Stimulating Hormone) (84443-TSH) TLB-Udip ONLY (81003-UDIP)  Problem # 3:  PARESTHESIA (ICD-782.0) Assessment: Deteriorated  Orders: T-Vitamin D (25-Hydroxy) (66440-34742) T-Serum Protein Electrophoresis (59563-87564) Radiology Referral (Radiology) TLB-B12, Serum-Total ONLY (33295-J88) TLB-BMP (Basic Metabolic Panel-BMET)  (80048-METABOL) TLB-CBC Platelet - w/Differential (85025-CBCD) TLB-Hepatic/Liver Function Pnl (80076-HEPATIC) TLB-CRP-High Sensitivity (C-Reactive Protein) (86140-FCRP) TLB-Sedimentation Rate (ESR) (85652-ESR) TLB-TSH (Thyroid Stimulating Hormone) (84443-TSH) TLB-Udip ONLY (81003-UDIP)  Problem # 4:  ANXIETY (ICD-300.00) Assessment: Deteriorated  Her updated medication list for this  problem includes:    Lorazepam 0.5 Mg Tabs (Lorazepam) .Marland Kitchen... 1 by mouth two times a day as needed anxiety  Complete Medication List: 1)  Mobic 15 Mg Tabs (Meloxicam) .... 1/2 or 1 by mouth once daily pc prn 2)  Levbid 0.375 Mg Tb12 (Hyoscyamine sulfate) .... Take 1 tablet by mouth every morning 3)  Levsin/sl 0.125 Mg Subl (Hyoscyamine sulfate) .... One tablet under tounge as needed 4)  Enalapril Maleate 5 Mg Tabs (Enalapril maleate) .Marland Kitchen.. 1 by mouth bid 5)  Skelaxin 800 Mg Tabs (Metaxalone) .... Three times a day as needed 6)  Norvasc 5 Mg Tabs (Amlodipine besylate) .... 1/2 once daily 7)  Fluocinonide-e 0.05 % Crea (Fluocinonide emulsified base) .... As directed 8)  Vitamin D3 1000 Unit Tabs (Cholecalciferol) .... One tablet by mouth once daily 9)  Protonix 40 Mg Tbec (Pantoprazole sodium) .... Once daily 10)  Lorazepam 0.5 Mg Tabs (Lorazepam) .Marland Kitchen.. 1 by mouth two times a day as needed anxiety 11)  Fish Oil Oil (Fish oil) .... One tablet by mouth once daily 12)  Citrucel 500 Mg Tabs (Methylcellulose (laxative)) .... One tablet by mouth once daily 13)  Multivitamins Tabs (Multiple vitamin) .... One tablet by mouth once daily 14)  Viactiv Multi-vitamin Chew (Multiple vitamins-calcium) .... One tablet by mouth two times a day 15)  Gi Cocktail- Equal Parts Maalox, Donnatal, Xylocaine  .Marland Kitchen.. 1 tbsp q 6-7 hrs as needed 16)  Restasis 0.05 % Emul (Cyclosporine) .Marland Kitchen.. 1 drop both eyes two times a day 17)  Osteo Bi-flex Adv Double St Caps (Misc natural products) .... Once daily  Patient Instructions: 1)  Please  schedule a follow-up appointment in 2 weeks. 2)  Hold skelaxin Prescriptions: ENALAPRIL MALEATE 5 MG TABS (ENALAPRIL MALEATE) 1 by mouth bid  #180 x 3   Entered and Authorized by:   Tresa Garter MD   Signed by:   Tresa Garter MD on 07/02/2009   Method used:   Print then Give to Patient   RxID:   [id number] MOBIC 15 MG TABS (MELOXICAM) 1/2 or 1 by mouth once daily pc prn  #90 x 3   Entered and Authorized by:   Tresa Garter MD   Signed by:   Tresa Garter MD on 07/02/2009   Method used:   Print then Give to Patient   RxID:   [id number]

## 2010-07-29 NOTE — Progress Notes (Signed)
Summary: Refill Hyomax SR   Phone Note From Pharmacy   Summary of Call: Refill request on Hyomax Sr .375 #90 by [facility]. Initial call taken by: Ashok Cordia RN,  July 01, 2009 11:51 AM    Prescriptions: LEVBID 0.375 MG TB12 (HYOSCYAMINE SULFATE) Take 1 tablet by mouth every morning  #90 x 3   Entered by:   Ashok Cordia RN   Authorized by:   Mardella Layman MD [facility]   Signed by:   Ashok Cordia RN on 07/01/2009   Method used:   Electronically to        [facility] (mail[facility]order)             ,          Ph: [phone]       Fax: [phone]   RxID:   [id number]   Appended Document: Refill Hyomax SR Electronic Rx did no go through.  Will fax to [facility].   Clinical Lists Changes  Medications: Rx of LEVBID 0.375 MG TB12 (HYOSCYAMINE SULFATE) Take 1 tablet by mouth every morning;  #90 x 3;  Signed;  Entered by: Ashok Cordia RN;  Authorized by: Mardella Layman MD [facility];  Method used: Faxed to [facility]*, , ,   , Ph: [phone], Fax: [phone]    Prescriptions: LEVBID 0.375 MG TB12 (HYOSCYAMINE SULFATE) Take 1 tablet by mouth every morning  #90 x 3   Entered by:   Ashok Cordia RN   Authorized by:   Mardella Layman MD [facility]   Signed by:   Ashok Cordia RN on 07/01/2009   Method used:   Faxed to ...       [facility] (mail[facility]order)             ,          Ph: [phone]       Fax: [phone]   RxID:   [id number]

## 2010-07-29 NOTE — Assessment & Plan Note (Signed)
Summary: 1 MO ROV /NWS  #   Vital Signs:  Patient profile:   65 year old female Weight:      150 pounds Temp:     97.4 degrees F oral Pulse rate:   88 / minute BP sitting:   124 / 70  (left arm)  Vitals Entered By: Tora Perches (August 27, 2009 1:21 PM) CC: f/u Is Patient Diabetic? No   Primary Care Provider:  Jacinta Shoe, MD   CC:  f/u.  History of Present Illness: F/u ataxia and paresthesia and neck pain. BP was up - was eating salty soup. Had GERD  Preventive Screening-Counseling & Management  Alcohol-Tobacco     Smoking Status: quit  Current Medications (verified): 1)  Levbid 0.375 Mg Tb12 (Hyoscyamine Sulfate) .... Take 1 Tablet By Mouth Every Morning 2)  Levsin/sl 0.125 Mg Subl (Hyoscyamine Sulfate) .... One Tablet Under Tounge As Needed 3)  Enalapril Maleate 5 Mg Tabs (Enalapril Maleate) .Marland Kitchen.. 1 By Mouth Bid 4)  Skelaxin 800 Mg  Tabs (Metaxalone) .... Three Times A Day As Needed 5)  Fluocinonide-E 0.05 %  Crea (Fluocinonide Emulsified Base) .... As Directed 6)  Vitamin D3 1000 Unit  Tabs (Cholecalciferol) .... One Tablet By Mouth Once Daily 7)  Protonix 40 Mg  Tbec (Pantoprazole Sodium) .... Once Daily 8)  Lorazepam 0.5 Mg Tabs (Lorazepam) .Marland Kitchen.. 1 By Mouth Two Times A Day As Needed Anxiety 9)  Fish Oil   Oil (Fish Oil) .... One Tablet By Mouth Once Daily 10)  Citrucel 500 Mg Tabs (Methylcellulose (Laxative)) .... One Tablet By Mouth Once Daily 11)  Multivitamins   Tabs (Multiple Vitamin) .... One Tablet By Mouth Once Daily 12)  Viactiv Multi-Vitamin  Chew (Multiple Vitamins-Calcium) .... One Tablet By Mouth Two Times A Day 13)  Gi Cocktail- Equal Parts Maalox, Donnatal, Xylocaine .Marland Kitchen.. 1 Tbsp Q 6-7 Hrs As Needed 14)  Restasis 0.05 % Emul (Cyclosporine) .Marland Kitchen.. 1 Drop Both Eyes Two Times A Day 15)  Dexamethasone 4 Mg Tabs (Dexamethasone) .... 8 Mg Two Times A Day X 2 D, 4 Mg Two Times A Day X 7 D, 4 Mg Once Daily X 7 D Then Stop 16)  Epidrin 325-65-100 Mg Caps  (Apap-Isometheptene-Dichloral) .Marland Kitchen.. 1 By Mouth Two Times A Day As Needed Migraine 17)  Aspir-Low 81 Mg Tbec (Aspirin) .... Two Times A Day  Allergies (verified): No Known Drug Allergies  Past History:  Past Medical History: Last updated: 02/21/2009 GERD Osteoarthritis Anemia-NOS Anemia-iron deficiency Hypertension Blood donor Colonosc. 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    Physical Exam  General:  Well-developed,well-nourished,in no acute distress; alert,appropriate and cooperative throughout examination Nose:  External nasal examination shows no deformity or inflammation. Nasal mucosa are pink and moist without lesions or exudates. Mouth:  Oral mucosa and oropharynx without lesions or exudates.  Teeth in good repair. Lungs:  Normal respiratory effort, chest expands symmetrically. Lungs are clear to auscultation, no crackles or wheezes. Heart:  Normal rate and regular rhythm. S1 and S2 normal without gallop, murmur, click, rub or other extra sounds. Abdomen:  Bowel sounds positive,abdomen soft and non-tender without masses, organomegaly or hernias noted. Msk:  No deformity  or scoliosis noted of thoracic or lumbar spine.   Neurologic:  5-/5 UE large muscles  and thighs and  B grip Babinski equivocal B, unable to do heel-to-toe, ataxic gait. Romberg neg Skin:  Intact without suspicious lesions or rashes Psych:  Cognition and judgment appear intact. Alert and cooperative with normal attention span and concentration. No apparent delusions, illusions, hallucinations   Impression & Recommendations:  Problem # 1:  SPINAL  CORD COMPRESSION (ICD-336.9) Assessment Improved post surg 3 wks The office visit took longer than 20 min with patient councelling for more than 50% of the 20 min    Problem # 2:  EDEMA (ICD-782.3) Assessment: Improved  Problem # 3:  ATAXIA (ICD-781.3) Assessment: Improved  Problem # 4:  CHEST DISCOMFORT (ICD-786.59) resolved  Complete Medication List: 1)  Amlodipine Besylate 5 Mg Tabs (Amlodipine besylate) .Marland Kitchen.. 1 by mouth qd 2)  Enalapril Maleate 5 Mg Tabs (Enalapril maleate) .Marland Kitchen.. 1 by mouth qd 3)  Levbid 0.375 Mg Tb12 (Hyoscyamine sulfate) .... Take 1 tablet by mouth every morning 4)  Levsin/sl 0.125 Mg Subl (Hyoscyamine sulfate) .... One tablet under tounge as needed 5)  Skelaxin 800 Mg Tabs (Metaxalone) .... Three times a day as needed 6)  Fluocinonide-e 0.05 % Crea (Fluocinonide emulsified base) .... As directed 7)  Vitamin D3 1000 Unit Tabs (Cholecalciferol) .... One tablet by mouth once daily 8)  Protonix 40 Mg Tbec (Pantoprazole sodium) .... Once daily 9)  Lorazepam 0.5 Mg Tabs (Lorazepam) .Marland Kitchen.. 1 by mouth two times a day as needed anxiety 10)  Fish Oil Oil (Fish oil) .... One tablet by mouth once daily 11)  Citrucel 500 Mg Tabs (Methylcellulose (laxative)) .... One tablet by mouth once daily 12)  Multivitamins Tabs (Multiple vitamin) .... One tablet by mouth once daily 13)  Viactiv Multi-vitamin Chew (Multiple vitamins-calcium) .... One tablet by mouth two times a day 14)  Gi Cocktail- Equal Parts Maalox, Donnatal, Xylocaine  .Marland Kitchen.. 1 tbsp q 6-7 hrs as needed 15)  Restasis 0.05 % Emul (Cyclosporine) .Marland Kitchen.. 1 drop both eyes two times a day 16)  Dexamethasone 4 Mg Tabs (Dexamethasone) .... 8 mg two times a day x 2 d, 4 mg two times a day x 7 d, 4 mg once daily x 7 d then stop 17)  Epidrin 325-65-100 Mg Caps (Apap-isometheptene-dichloral) .Marland Kitchen.. 1 by mouth two times a day as needed migraine 18)  Aspir-low 81 Mg Tbec (Aspirin) .... Two times a day  Patient Instructions: 1)  Please  schedule a follow-up appointment in 3 months. 2)  BMP prior to visit, ICD-9: 3)  TSH prior to visit, ICD-9: 4)  CBC w/ Diff prior to visit, ICD-9:401.1  99520 5)  Use stretching exercises that I have provided (15 min. or longer every day) Prescriptions: AMLODIPINE BESYLATE 5 MG TABS (AMLODIPINE BESYLATE) 1 by mouth qd  #30 x 12   Entered and Authorized by:   Tresa Garter MD   Signed by:   Tresa Garter MD on 08/27/2009   Method used:   Print then Give to Patient   RxID:   [id number]

## 2010-07-29 NOTE — Progress Notes (Signed)
Summary: Elevated BP  Phone Note Call from Patient   Summary of Call: Pt had surgery on c-spine last week. Pt was cut back to enalapril 5mg  to 1 once daily and stopped norvasc. She had severe h/a, bp 169/119. She took asprin, pain pill and enalapril. Bp after 167/99. Any further reccomendation in regard to pt's bp?  Initial call taken by: Lamar Sprinkles, CMA,  August 12, 2009 3:33 PM  Follow-up for Phone Call        take Enalapril bid Follow-up by: Tresa Garter MD,  August 12, 2009 3:51 PM  Additional Follow-up for Phone Call Additional follow up Details #1::        Pt informed  Additional Follow-up by: Lamar Sprinkles, CMA,  August 12, 2009 5:17 PM

## 2010-07-29 NOTE — Progress Notes (Signed)
Summary: Enalapril--[facility]  Phone Note Other Incoming   Summary of Call: Rec'd fax form from [facility] requesting a med change/new med. They are asking if patient can take the Enalapril 1 by mouth once daily. Please advise. Initial call taken by: Lucious Groves,  July 15, 2009 3:50 PM  Follow-up for Phone Call        No, she pref. two times a day. Sorry... Follow-up by: Tresa Garter MD,  July 15, 2009 5:40 PM  Additional Follow-up for Phone Call Additional follow up Details #1::        wrote "denied" on the fax and sent it back. Additional Follow-up by: Lucious Groves,  July 17, 2009 1:07 PM

## 2010-07-29 NOTE — Assessment & Plan Note (Signed)
Summary: 2 WK ROV /NWS #   Vital Signs:  Patient profile:   65 year old female Weight:      151 pounds Temp:     97.1 degrees F oral Pulse rate:   84 / minute BP sitting:   146 / 80  (left arm)  Vitals Entered By: Tora Perches (July 15, 2009 10:55 AM) CC: f/u Is Patient Diabetic? No   Primary Care Provider:  Jacinta Shoe, MD   CC:  f/u.  History of Present Illness: C/o numbness in B arms x 6 weeks. C/o weakness and clumsiness in LE - worse. Feels "disjointed" worse lately x 1 mo. No falls. C/o swollen hands.  Preventive Screening-Counseling & Management  Alcohol-Tobacco     Smoking Status: quit  Current Medications (verified): 1)  Mobic 15 Mg Tabs (Meloxicam) .... 1/2 or 1 By Mouth Once Daily Pc Prn 2)  Levbid 0.375 Mg Tb12 (Hyoscyamine Sulfate) .... Take 1 Tablet By Mouth Every Morning 3)  Levsin/sl 0.125 Mg Subl (Hyoscyamine Sulfate) .... One Tablet Under Tounge As Needed 4)  Enalapril Maleate 5 Mg Tabs (Enalapril Maleate) .Marland Kitchen.. 1 By Mouth Bid 5)  Skelaxin 800 Mg  Tabs (Metaxalone) .... Three Times A Day As Needed 6)  Norvasc 5 Mg  Tabs (Amlodipine Besylate) .... 1/2 Once Daily 7)  Fluocinonide-E 0.05 %  Crea (Fluocinonide Emulsified Base) .... As Directed 8)  Vitamin D3 1000 Unit  Tabs (Cholecalciferol) .... One Tablet By Mouth Once Daily 9)  Protonix 40 Mg  Tbec (Pantoprazole Sodium) .... Once Daily 10)  Lorazepam 0.5 Mg Tabs (Lorazepam) .Marland Kitchen.. 1 By Mouth Two Times A Day As Needed Anxiety 11)  Fish Oil   Oil (Fish Oil) .... One Tablet By Mouth Once Daily 12)  Citrucel 500 Mg Tabs (Methylcellulose (Laxative)) .... One Tablet By Mouth Once Daily 13)  Multivitamins   Tabs (Multiple Vitamin) .... One Tablet By Mouth Once Daily 14)  Viactiv Multi-Vitamin  Chew (Multiple Vitamins-Calcium) .... One Tablet By Mouth Two Times A Day 15)  Gi Cocktail- Equal Parts Maalox, Donnatal, Xylocaine .Marland Kitchen.. 1 Tbsp Q 6-7 Hrs As Needed 16)  Restasis 0.05 % Emul (Cyclosporine) .Marland Kitchen.. 1  Drop Both Eyes Two Times A Day 17)  Osteo Bi-Flex Adv Double St  Caps (Misc Natural Products) .... Once Daily  Allergies (verified): No Known Drug Allergies  Past History:  Past Medical History: Last updated: 02/21/2009 GERD Osteoarthritis Anemia-NOS Anemia-iron deficiency Hypertension Blood donor Colonosc. 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  The patient denies anorexia, fever, weight loss, weight gain, vision loss, hoarseness, chest pain, dyspnea on exertion, headaches, abdominal pain, hematochezia, suspicious skin lesions, transient blindness, and depression.    Physical Exam  General:  Well-developed,well-nourished,in no acute distress; alert,appropriate and cooperative throughout examination Nose:  External nasal examination shows no deformity or inflammation. Nasal mucosa are pink and moist without lesions or exudates. Mouth:  Oral mucosa and oropharynx without lesions or exudates.  Teeth in good repair. Neck:  No deformities, masses, or tenderness noted. Lungs:  Normal respiratory effort, chest expands symmetrically. Lungs  are clear to auscultation, no crackles or wheezes. Heart:  Normal rate and regular rhythm. S1 and S2 normal without gallop, murmur, click, rub or other extra sounds. Abdomen:  Bowel sounds positive,abdomen soft and non-tender without masses, organomegaly or hernias noted. Msk:  No deformity or scoliosis noted of thoracic or lumbar spine.   Extremities:  No clubbing, cyanosis, edema, or deformity noted with  normal full range of motion of all joints.   Neurologic:  5-/5 UE large muscles  and thighs and  B grip Babinski equivocal B, unable to do heel-to-toe, ataxic gait. Romberg neg Skin:  Intact without suspicious lesions or rashes Cervical Nodes:  No lymphadenopathy noted Inguinal Nodes:  No significant adenopathy Psych:  Cognition and judgment appear intact. Alert and cooperative with normal attention span and concentration. No apparent delusions, illusions, hallucinations   Impression & Recommendations:  Problem # 1:  ATAXIA (ICD-781.3) Assessment Deteriorated MRI head/neck has not been done yet for unknown reasons.Marland KitchenWill reshedule Orders: Neurology Referral (Neuro) T-Aldolase [phone])  Problem # 2:  PARESTHESIA (ICD-782.0) Assessment: Deteriorated  Orders: Neurology Referral (Neuro)  Problem # 3:  EDEMA (ICD-782.3)  Orders: T-Aldolase ([id number])  Problem # 4:  FATIGUE, ACUTE (ICD-780.79) Assessment: Unchanged  Orders: Neurology Referral (Neuro) T-Aldolase [phone]) TLB-CK Total Only(Creatine Kinase/CPK) (82550-CK) T-Cortisol, AM (96295)  Complete Medication List: 1)  Mobic 15 Mg Tabs (Meloxicam) .... 1/2 or 1 by mouth once daily pc prn 2)  Levbid 0.375 Mg Tb12 (Hyoscyamine sulfate) .... Take 1 tablet by mouth every morning 3)  Levsin/sl 0.125 Mg Subl (Hyoscyamine sulfate) .... One tablet under tounge as needed 4)  Enalapril Maleate 5 Mg Tabs (Enalapril maleate) .Marland Kitchen.. 1 by mouth bid 5)  Skelaxin 800 Mg Tabs (Metaxalone) .... Three times a day as needed 6)  Fluocinonide-e 0.05 % Crea (Fluocinonide emulsified base) .... As directed 7)  Vitamin D3 1000 Unit Tabs (Cholecalciferol) .... One tablet by mouth once daily 8)  Protonix 40 Mg Tbec (Pantoprazole sodium) .... Once daily 9)  Lorazepam 0.5 Mg Tabs (Lorazepam) .Marland Kitchen.. 1 by mouth two times a day as needed anxiety 10)  Fish Oil Oil (Fish oil) .... One tablet by mouth once daily 11)  Citrucel 500 Mg Tabs  (Methylcellulose (laxative)) .... One tablet by mouth once daily 12)  Multivitamins Tabs (Multiple vitamin) .... One tablet by mouth once daily 13)  Viactiv Multi-vitamin Chew (Multiple vitamins-calcium) .... One tablet by mouth two times a day 14)  Gi Cocktail- Equal Parts Maalox, Donnatal, Xylocaine  .Marland Kitchen.. 1 tbsp q 6-7 hrs as needed 15)  Restasis 0.05 % Emul (Cyclosporine) .Marland Kitchen.. 1 drop both eyes two times a day 16)  Osteo Bi-flex Adv Double St Caps (Misc natural products) .... Once daily  Patient Instructions: 1)  Stop Norvasc 2)  Please schedule a follow-up appointment in 1 month.  Appended Document: Orders Update    Clinical Lists Changes  Orders: Added new Test order of TLB-Cortisol (82533-CORT) - Signed Added new Test order of TLB-Cortisol (82533-CORT) - Signed

## 2010-07-31 NOTE — Assessment & Plan Note (Signed)
Summary: CPX / NWS  #   Vital Signs:  Patient profile:   64 year old female Height:      67 inches Weight:      151 pounds BMI:     23.74 Temp:     98.3 degrees F oral Pulse rate:   88 / minute Pulse rhythm:   regular Resp:     16 per minute BP sitting:   168 / 80  (left arm) Cuff size:   regular  Vitals Entered By: Lanier Prude, CMA(AAMA) (July 08, 2010 10:54 AM) CC: CPX Is Patient Diabetic? No Comments pt is not taking Asp or Restasis.  She needs Rf of Enalapril sent to [facility]   Primary Care Provider:  Jacinta Shoe, MD   CC:  CPX.  History of Present Illness: The patient presents for a preventive health examination  C/o neck pain x 1 d  Current Medications (verified): 1)  Amlodipine Besylate 5 Mg Tabs (Amlodipine Besylate) .Marland Kitchen.. 1 By Mouth Qd 2)  Enalapril Maleate 5 Mg Tabs (Enalapril Maleate) .Marland Kitchen.. 1 By Mouth Qd 3)  Levbid 0.375 Mg Tb12 (Hyoscyamine Sulfate) .... Take 1 Tablet By Mouth Every Morning 4)  Levsin/sl 0.125 Mg Subl (Hyoscyamine Sulfate) .... One Tablet Under Tounge As Needed 5)  Skelaxin 800 Mg  Tabs (Metaxalone) .... Three Times A Day As Needed 6)  Fluocinonide-E 0.05 %  Crea (Fluocinonide Emulsified Base) .... As Directed 7)  Vitamin D3 1000 Unit  Tabs (Cholecalciferol) .... One Tablet By Mouth Once Daily 8)  Protonix 40 Mg  Tbec (Pantoprazole Sodium) .... Once Daily 9)  Lorazepam 0.5 Mg Tabs (Lorazepam) .Marland Kitchen.. 1 By Mouth Two Times A Day As Needed Anxiety 10)  Fish Oil   Oil (Fish Oil) .... One Tablet By Mouth Once Daily 11)  Citrucel 500 Mg Tabs (Methylcellulose (Laxative)) .... One Tablet By Mouth Once Daily 12)  Multivitamins   Tabs (Multiple Vitamin) .... One Tablet By Mouth Once Daily 13)  Viactiv Multi-Vitamin  Chew (Multiple Vitamins-Calcium) .... One Tablet By Mouth Two Times A Day 14)  Restasis 0.05 % Emul (Cyclosporine) .Marland Kitchen.. 1 Drop Both Eyes Two Times A Day 15)  Epidrin 325-65-100 Mg Caps (Apap-Isometheptene-Dichloral) .Marland Kitchen.. 1 By Mouth Two  Times A Day As Needed Migraine 16)  Aspir-Low 81 Mg Tbec (Aspirin) .... Two Times A Day 17)  Pennsaid 1.5 % Soln (Diclofenac Sodium) .... 3-5 Gtt On Skin Three Times A Day For Pain 18)  B Complex  Tabs (B Complex Vitamins) .Marland Kitchen.. 1 By Mouth Once Daily 19)  Methocarbamol 500 Mg Tabs (Methocarbamol) .Marland Kitchen.. 1 By Mouth At Bedtime  Allergies (verified): No Known Drug Allergies  Past History:  Past Medical History: Last updated: 02/21/2009 GERD Osteoarthritis Anemia-NOS Anemia-iron deficiency Hypertension Blood donor Colonosc. 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  Past Surgical History: Appendectomy Cholecystectomy Cervical fusion C4-5 2011 Dr Alveda Reasons Dr Jarold Motto  Review of Systems  The patient denies anorexia, fever, weight loss, weight gain, vision loss, decreased hearing, hoarseness, chest pain, syncope, dyspnea on exertion, peripheral edema, prolonged cough, headaches, hemoptysis, abdominal pain, melena, hematochezia, severe indigestion/heartburn, hematuria, incontinence, genital sores, muscle weakness, suspicious skin lesions, transient blindness, difficulty walking, depression, unusual weight change, abnormal bleeding, enlarged lymph nodes, angioedema,  and breast masses.         BP has been nl  Physical Exam  General:  Well-developed,well-nourished,in no acute distress; alert,appropriate and cooperative throughout examination Head:  Normocephalic and atraumatic without obvious abnormalities. No apparent alopecia or balding. Eyes:  No corneal or conjunctival inflammation noted. EOMI. Perrla. Ears:  External ear exam shows no significant lesions or deformities.  Otoscopic examination reveals clear canals,  tympanic membranes are intact bilaterally without bulging, retraction, inflammation or discharge. Hearing is grossly normal bilaterally. Nose:  External nasal examination shows no deformity or inflammation. Nasal mucosa are pink and moist without lesions or exudates. Mouth:  Oral mucosa and oropharynx without lesions or exudates.  Teeth in good repair. Neck:  No deformities, masses, or tenderness noted. Lungs:  Normal respiratory effort, chest expands symmetrically. Lungs are clear to auscultation, no crackles or wheezes. Heart:  Normal rate and regular rhythm. S1 and S2 normal without gallop, murmur, click, rub or other extra sounds. Abdomen:  Bowel sounds positive,abdomen soft and non-tender without masses, organomegaly or hernias noted. Msk:  No deformity or scoliosis noted of thoracic or lumbar spine.   Neurologic:  5-/5 UE large muscles  and thighs and  B grip Babinski equivocal B, unable to do heel-to-toe, ataxic gait. Romberg neg Skin:  Intact without suspicious lesions or rashes Cervical Nodes:  No lymphadenopathy noted Psych:  Cognition and judgment appear intact. Alert and cooperative with normal attention span and concentration. No apparent delusions, illusions, hallucinations   Impression & Recommendations:  Problem # 1:  ROUTINE GENERAL MEDICAL [email or web address]  CARE FACL (ICD-V70.0) Assessment New Colon Dr Jarold Motto Orders: EKG w/ Interpretation (93000) Gastroenterology Referral (GI) Health and age related issues were discussed. Available screening tests and vaccinations were discussed as well. Healthy life style including good diet and exercise was discussed.  The labs were reviewed with the patient.   Problem # 2:  CERVICALGIA (ICD-723.1) MSK Assessment: New See "Patient Instructions".  Given Vimovo 1 tab in the office Her updated medication list for this problem includes:    Skelaxin 800 Mg Tabs (Metaxalone) .Marland Kitchen... Three times a day as needed    Aspir-low 81 Mg Tbec  (Aspirin) .Marland Kitchen..Marland Kitchen Two times a day    Methocarbamol 500 Mg Tabs (Methocarbamol) .Marland Kitchen... 1 by mouth at bedtime  Problem # 3:  HYPERTENSION (ICD-401.9) Assessment: Unchanged BP is nl at home Her updated medication list for this problem includes:    Amlodipine Besylate 5 Mg Tabs (Amlodipine besylate) .Marland Kitchen... 1 by mouth qd    Enalapril Maleate 5 Mg Tabs (Enalapril maleate) .Marland Kitchen... 1 by mouth qd  Problem # 4:  GERD (ICD-530.81) Assessment: Unchanged  Her updated medication list for this problem includes:    Levbid 0.375 Mg Tb12 (Hyoscyamine sulfate) .Marland Kitchen... Take 1 tablet by mouth every morning    Levsin/sl 0.125 Mg Subl (Hyoscyamine sulfate) ..... One tablet under tounge as needed    Protonix 40 Mg Tbec (Pantoprazole sodium) ..... Once daily  Complete Medication List: 1)  Amlodipine Besylate 5 Mg Tabs (Amlodipine besylate) .Marland Kitchen.. 1 by mouth qd 2)  Enalapril Maleate 5 Mg Tabs (Enalapril maleate) .Marland Kitchen.. 1 by mouth qd 3)  Levbid 0.375 Mg Tb12 (Hyoscyamine sulfate) .... Take 1 tablet by mouth every morning 4)  Levsin/sl 0.125 Mg Subl (Hyoscyamine sulfate) .... One tablet under tounge as needed 5)  Skelaxin 800 Mg Tabs (Metaxalone) .... Three times a day as needed 6)  Fluocinonide-e 0.05 % Crea (Fluocinonide emulsified base) .... As directed 7)  Vitamin D3 1000  Unit Tabs (Cholecalciferol) .... One tablet by mouth once daily 8)  Protonix 40 Mg Tbec (Pantoprazole sodium) .... Once daily 9)  Lorazepam 0.5 Mg Tabs (Lorazepam) .Marland Kitchen.. 1 by mouth two times a day as needed anxiety 10)  Fish Oil Oil (Fish oil) .... One tablet by mouth once daily 11)  Citrucel 500 Mg Tabs (Methylcellulose (laxative)) .... One tablet by mouth once daily 12)  Multivitamins Tabs (Multiple vitamin) .... One tablet by mouth once daily 13)  Viactiv Multi-vitamin Chew (Multiple vitamins-calcium) .... One tablet by mouth two times a day 14)  Restasis 0.05 % Emul (Cyclosporine) .Marland Kitchen.. 1 drop both eyes two times a day 15)  Epidrin 325-65-100 Mg  Caps (Apap-isometheptene-dichloral) .Marland Kitchen.. 1 by mouth two times a day as needed migraine 16)  Aspir-low 81 Mg Tbec (Aspirin) .... Two times a day 17)  Pennsaid 1.5 % Soln (Diclofenac sodium) .... 3-5 gtt on skin three times a day for pain 18)  B Complex Tabs (B complex vitamins) .Marland Kitchen.. 1 by mouth once daily 19)  Methocarbamol 500 Mg Tabs (Methocarbamol) .Marland Kitchen.. 1 by mouth at bedtime  Patient Instructions: 1)  Go on Youtube (www.youtube.com) and look up "piriformis stretch", and "gluteus stretch". See the anatomy and learn the symptoms.  Do the stretches - it may help!  2)  Please schedule a follow-up appointment in 6 months. 3)  BMP prior to visit, ICD-9:401.1 Prescriptions: ENALAPRIL MALEATE 5 MG TABS (ENALAPRIL MALEATE) 1 by mouth qd  #90 x 3   Entered and Authorized by:   Tresa Garter MD   Signed by:   Tresa Garter MD on 07/08/2010   Method used:   Faxed to ...       [facility] (mail-order)             , [place]         Ph: [phone]       Fax: [phone]   RxID:   [id number] ENALAPRIL MALEATE 5 MG TABS (ENALAPRIL MALEATE) 1 by mouth qd  #90 x 1   Entered and Authorized by:   Tresa Garter MD   Signed by:   Tresa Garter MD on 07/08/2010   Method used:   Print then Give to Patient   RxID:   [id number]    Orders Added: 1)  EKG w/ Interpretation [93000] 2)  Gastroenterology Referral [GI] 3)  Est. Patient age 13-64 [99396]   Immunization History:  Tetanus/Td Immunization History:    Tetanus/Td:  historical (04/02/2004)   Immunization History:  Tetanus/Td Immunization History:    Tetanus/Td:  Historical (04/02/2004)

## 2010-08-11 ENCOUNTER — Telehealth: Payer: Self-pay | Admitting: Internal Medicine

## 2010-08-20 NOTE — Progress Notes (Signed)
Summary: RF  Phone Note Refill Request Message from:  Pharmacy  Refills Requested: Medication #1:  SKELAXIN 800 MG  TABS three times a day as needed [facility]  Initial call taken by: Lamar Sprinkles, CMA,  August 11, 2010 6:40 PM  Follow-up for Phone Call        ok x 3 ref Follow-up by: Tresa Garter MD,  August 11, 2010 10:14 PM    Prescriptions: SKELAXIN 800 MG  TABS (METAXALONE) three times a day as needed  #90 x 3   Entered by:   Lamar Sprinkles, CMA   Authorized by:   Tresa Garter MD   Signed by:   Lamar Sprinkles, CMA on 08/12/2010   Method used:   Faxed to ...       [facility] (retail)       [address] [place]       [address]       [place], [place]  [address]       Ph: [phone]       Fax: [phone]   RxID:   [id number]

## 2010-09-17 ENCOUNTER — Other Ambulatory Visit: Payer: Self-pay | Admitting: Gastroenterology

## 2010-09-17 MED ORDER — HYOSCYAMINE SULFATE 0.125 MG SL SUBL: 0.1250 mg | SUBLINGUAL_TABLET | Freq: Three times a day (TID) | SUBLINGUAL | Status: DC | PRN

## 2010-09-17 NOTE — Telephone Encounter (Signed)
Pt not seen since 2010 advised her I have snet 30 days but she will need ov to get more refills.

## 2010-09-18 LAB — CBC
HCT: 36.9 % (ref 36.0–46.0)
Hemoglobin: 14.6 g/dL (ref 12.0–15.0)
Hemoglobin: 14.8 g/dL (ref 12.0–15.0)
MCHC: 33.7 g/dL (ref 30.0–36.0)
MCHC: 33.9 g/dL (ref 30.0–36.0)
Platelets: 227 10*3/uL (ref 150–400)
Platelets: 313 10*3/uL (ref 150–400)
RBC: 4.01 MIL/uL (ref 3.87–5.11)
RBC: 4.74 MIL/uL (ref 3.87–5.11)
RDW: 15.8 % — ABNORMAL HIGH (ref 11.5–15.5)
WBC: 11.3 10*3/uL — ABNORMAL HIGH (ref 4.0–10.5)
WBC: 13.2 10*3/uL — ABNORMAL HIGH (ref 4.0–10.5)

## 2010-09-18 LAB — BASIC METABOLIC PANEL
BUN: 12 mg/dL (ref 6–23)
GFR calc Af Amer: >60 mL/min (ref >60)
GFR calc non Af Amer: >60 mL/min (ref >60)
Potassium: 4.6 mEq/L (ref 3.5–5.1)

## 2010-09-18 LAB — URINALYSIS, ROUTINE W REFLEX MICROSCOPIC
Bilirubin Urine: NEGATIVE
Ketones, ur: NEGATIVE mg/dL
Nitrite: NEGATIVE
Protein, ur: NEGATIVE mg/dL
Urobilinogen, UA: 0.2 mg/dL (ref 0.0–1.0)

## 2010-09-18 LAB — COMPREHENSIVE METABOLIC PANEL
ALT: 22 U/L (ref 0–35)
AST: 22 U/L (ref 0–37)
Alkaline Phosphatase: 57 U/L (ref 39–117)
CO2: 28 mEq/L (ref 19–32)
Chloride: 95 mEq/L — ABNORMAL LOW (ref 96–112)
GFR calc Af Amer: >60 mL/min (ref >60)
GFR calc non Af Amer: >60 mL/min (ref >60)
Glucose, Bld: 92 mg/dL (ref 70–99)
Sodium: 133 mEq/L — ABNORMAL LOW (ref 135–145)
Total Bilirubin: 0.3 mg/dL (ref 0.3–1.2)

## 2010-09-18 LAB — DIFFERENTIAL
Basophils Absolute: 0.1 10*3/uL (ref 0.0–0.1)
Basophils Absolute: 0.1 10*3/uL (ref 0.0–0.1)
Basophils Relative: 1 % (ref 0–1)
Basophils Relative: 1 % (ref 0–1)
Eosinophils Absolute: 0 10*3/uL (ref 0.0–0.7)
Eosinophils Relative: 0 % (ref 0–5)
Monocytes Absolute: 0.8 10*3/uL (ref 0.1–1.0)
Neutro Abs: 10.7 10*3/uL — ABNORMAL HIGH (ref 1.7–7.7)
Neutrophils Relative %: 77 % (ref 43–77)
Neutrophils Relative %: 82 % — ABNORMAL HIGH (ref 43–77)

## 2010-09-18 LAB — PROTIME-INR
INR: 0.9 (ref 0.00–1.49)
Prothrombin Time: 12 seconds (ref 11.6–15.2)

## 2010-09-18 LAB — APTT: aPTT: 22 seconds — ABNORMAL LOW (ref 24–37)

## 2010-09-18 LAB — VITAMIN D 25 HYDROXY (VIT D DEFICIENCY, FRACTURES): Vit D, 25-Hydroxy: 37 ng/mL (ref 30–89)

## 2010-09-26 ENCOUNTER — Other Ambulatory Visit: Payer: Self-pay | Admitting: Gastroenterology

## 2010-09-26 MED ORDER — HYOSCYAMINE SULFATE ER 0.375 MG PO TB12: 0.3750 mg | ORAL_TABLET | Freq: Two times a day (BID) | ORAL | Status: DC | PRN

## 2010-09-26 NOTE — Telephone Encounter (Signed)
rx resent for levbid

## 2010-10-14 ENCOUNTER — Ambulatory Visit: Payer: Self-pay | Admitting: Gastroenterology

## 2010-10-14 ENCOUNTER — Telehealth: Payer: Self-pay

## 2010-10-14 NOTE — Telephone Encounter (Signed)
[facility] Triage Call Report Triage Record Num: [id number] Operator: Freddie Breech Patient Name: Haley Armstrong Call Date & Time: 10/11/2010 9:07:23AM Patient Phone: [phone] PCP: Sonda Primes Patient Gender: Female PCP Fax : [phone] Patient DOB: [birth year] [facility] Name: [facility] Reason for Call: Pt has sipped on warm coffee, gargled w/salt water. Throat feels much better. No white spots on her tonsils. Home Care advised per ST Protocol. Call back parameters reviewed. Protocol(s) Used: Sore Throat or Hoarseness Recommended Outcome per Protocol: Provide Home/Self Care Reason for Outcome: Sore throat AND no other symptoms Care Advice: ~ See a provider if have sore throat symptoms for 2 weeks or more, or if have swollen lymph nodes. ~ SYMPTOM / CONDITION MANAGEMENT Most adults need to drink 6-10 eight-ounce glasses (1.2-2.0 liters) of fluids per day unless previously told to limit fluid intake for other medical reasons. Limit fluids that contain caffeine, sugar or alcohol. Urine will be a very light yellow color when you drink enough fluids. ~ Analgesic/Antipyretic Advice - Acetaminophen: Consider acetaminophen as directed on label or by pharmacist/provider for pain or fever PRECAUTIONS: - Use if there is no history of liver disease, alcoholism, or intake of three or more alcohol drinks per day - Only if approved by provider during pregnancy or when breastfeeding - During pregnancy, acetaminophen should not be taken more than 3 consecutive days without telling provider - Do not exceed recommended dose or frequency ~ Sore Throat Relief: - Use warm salt water gargles 3 to 4 times/day, as needed (1/2 tsp. salt in 8 oz. [.2 liters] water). - Suck on hard candy, nonprescription or herbal throat lozenges (sugar-free if diabetic) - Eat soothing, soft food/fluids (broths, soups, or honey and lemon juice in hot tea, Popsicles, frozen yogurt or sherbet, scrambled eggs, cooked  cereals, Jell-O or puddings) whichever is most comforting. - Avoid eating salty, spicy or acidic foods. ~ To help relieve nasal congestion, use nonprescription saline nasal spray according to label instructions or as directed by a healthcare provider. ~ 10/11/2010 9:14:31AM Page 1 of 1 [email or web address]

## 2010-10-14 NOTE — Telephone Encounter (Signed)
[facility] Triage Call Report Triage Record Num: [id number] Operator: Freddie Breech Patient Name: Haley Armstrong Call Date & Time: 10/11/2010 8:14:59AM Patient Phone: [phone] PCP: Sonda Primes Patient Gender: Female PCP Fax : [phone] Patient DOB: Aug 02, 1945 [facility] Name[facility] Reason for Call: Pt is calling for an appt for a ST onset 10/10/10. Very painful to swallow. Emergent sx r/o. RN sched CB for 0900 when the ofc opens. Protocol(s) Used: Office Note Recommended Outcome per Protocol: Information Noted and Sent to Office Reason for Outcome: Caller information to office Care Advice: ~ 10/11/2010 8:22:01AM Page 1 of 1 CAN

## 2010-10-17 ENCOUNTER — Telehealth: Payer: Self-pay | Admitting: *Deleted

## 2010-10-17 NOTE — Telephone Encounter (Signed)
Patient requesting rx for ABX for what she feels is a sinus infection. She is c/o sinus congestion and dark green mucus. (unable to go to [facility] b/c she will be working)

## 2010-10-18 MED ORDER — AZITHROMYCIN 250 MG PO TABS: ORAL_TABLET | ORAL | Status: DC

## 2010-10-18 NOTE — Telephone Encounter (Signed)
Ok Z pac 

## 2010-10-20 NOTE — Telephone Encounter (Signed)
Patient informed. 

## 2010-10-21 ENCOUNTER — Encounter: Payer: Self-pay | Admitting: Gastroenterology

## 2010-10-21 ENCOUNTER — Ambulatory Visit (INDEPENDENT_AMBULATORY_CARE_PROVIDER_SITE_OTHER): Payer: BC Managed Care – PPO | Admitting: Gastroenterology

## 2010-10-21 DIAGNOSIS — K589 Irritable bowel syndrome without diarrhea: Secondary | ICD-10-CM

## 2010-10-21 DIAGNOSIS — Z8719 Personal history of other diseases of the digestive system: Secondary | ICD-10-CM | POA: Insufficient documentation

## 2010-10-21 DIAGNOSIS — D509 Iron deficiency anemia, unspecified: Secondary | ICD-10-CM

## 2010-10-21 DIAGNOSIS — K219 Gastro-esophageal reflux disease without esophagitis: Secondary | ICD-10-CM

## 2010-10-21 MED ORDER — PANTOPRAZOLE SODIUM 40 MG PO TBEC: 40.0000 mg | DELAYED_RELEASE_TABLET | Freq: Every day | ORAL | Status: DC

## 2010-10-21 MED ORDER — HYOSCYAMINE SULFATE ER 0.375 MG PO TB12: 0.3750 mg | ORAL_TABLET | Freq: Two times a day (BID) | ORAL | Status: DC | PRN

## 2010-10-21 MED ORDER — AMBULATORY NON FORMULARY MEDICATION: 1.0000 mL | Status: DC | PRN

## 2010-10-21 MED ORDER — LUBIPROSTONE 8 MCG PO CAPS: 8.0000 ug | ORAL_CAPSULE | Freq: Two times a day (BID) | ORAL | Status: DC

## 2010-10-21 NOTE — Patient Instructions (Signed)
We gave you samples of Align if they help you can buy them OTC. Your prescription(s) have been sent to you pharmacy.  Please go to the basement today for your labs.

## 2010-10-21 NOTE — Progress Notes (Signed)
History of Present Illness:  This is a very pleasant 65 year old Caucasian female that I follow for several years with constipation predominant IBS and chronic GERD. She is up-to-date on her endoscopies and colonoscopies. She continues to have some esophageal spasm-type complaints despite daily Protonix and when necessary GI cocktail. There is no dysphagia, hepatobiliary complaints, but she does have gas bloating and constipation without rectal bleeding. Her appetite is good, her weight is stable, and she denies systemic complaints, use of alcohol, cigarettes, or NSAIDs. Does have well-controlled essential hypertension, and is currently being treated for sinusitis with a Z-Pak per primary care. The patient does use when necessary senna, and takes daily methylcellulose. Previous colonoscopy showed a very redundant and tortuous colon,, incomplete colonoscopy, but negative barium enema in 2008.  I have reviewed this patient's present history, medical and surgical past history, allergies and medications.     ROS: The remainder of the 10 point ROS is negative. No symptoms of collagen vascular disease, specific cardiac abnormalities, or gynecologic problems at this time. She does have a past history of iron deficiency anemia of unexplained etiology. She sees Dr. Huel Cote for gynecologic care and denies current complaints.   Past Medical History  Diagnosis Date  . GERD (gastroesophageal reflux disease)   . Osteoarthritis   . Anemia, iron deficiency   . Hypertension   . Blood donor   . Anxiety   . IBS (irritable bowel syndrome)    Past Surgical History  Procedure Date  . Appendectomy   . Cholecystectomy   . Cervical fusion 2011    C4-5 Dr Alveda Reasons    reports that she has quit smoking. She has never used smokeless tobacco. She reports that she drinks alcohol. She reports that she does not use illicit drugs. family history includes Dementia in her mother; Heart disease in her brother; Lymphoma  in her mother; and Parkinsonism in her father.  There is no history of Colon cancer. No Known Allergies     Physical Exam: General well developed well nourished patient in no acute distress, appearing their stated age Eyes PERRLA, no icterus, fundoscopic exam per opthamologist Skin no lesions noted, Chest clear to percussion  enlargement, no tendernessand auscultation Heart no significant murmurs, gallops or rubs noted Abdomen no hepatosplenomegaly masses or tenderness, BS normal.  Extremities no acute joint lesions, edema, phlebitis or evidence of cellulitis. Neurologic patient oriented x 3, cranial nerves intact, no focal neurologic deficits noted. Psychological mental status normal and normal affect.  Assessment and plan: Constipation predominant IBS and chronic GERD. However due to PPI prescription, reviewed reflux maneuvers, and we'll let her use when necessary the Levbid and GI cocktail for esophageal spasms. I also will try Amitiza 8 mcg twice a day, continue fiber supplements and liberal by mouth fluids, repeat her labs and also do IFOB cards for occult blood. Labs have been ordered per history of iron deficiency. May need followup colonoscopy and endoscopy depending on her clinical course and lab results.  Please copy her primary care physician, referring physician, and pertinent subspecialists. No diagnosis found.

## 2010-10-23 ENCOUNTER — Other Ambulatory Visit (INDEPENDENT_AMBULATORY_CARE_PROVIDER_SITE_OTHER): Payer: BC Managed Care – PPO

## 2010-10-23 DIAGNOSIS — K589 Irritable bowel syndrome without diarrhea: Secondary | ICD-10-CM

## 2010-10-23 DIAGNOSIS — K219 Gastro-esophageal reflux disease without esophagitis: Secondary | ICD-10-CM

## 2010-10-23 DIAGNOSIS — D509 Iron deficiency anemia, unspecified: Secondary | ICD-10-CM

## 2010-10-23 LAB — HEPATIC FUNCTION PANEL
ALT: 19 U/L (ref 0–35)
AST: 24 U/L (ref 0–37)
Bilirubin, Direct: 0 mg/dL (ref 0.0–0.3)
Total Bilirubin: 0.5 mg/dL (ref 0.3–1.2)

## 2010-10-23 LAB — FERRITIN: Ferritin: 17.3 ng/mL (ref 10.0–291.0)

## 2010-10-23 LAB — BASIC METABOLIC PANEL
CO2: 28 mEq/L (ref 19–32)
Chloride: 103 mEq/L (ref 96–112)
Creatinine, Ser: 1 mg/dL (ref 0.4–1.2)
Sodium: 140 mEq/L (ref 135–145)

## 2010-10-23 LAB — FOLATE: Folate: 22.5 ng/mL (ref >5.9)

## 2010-10-23 LAB — CBC WITH DIFFERENTIAL/PLATELET
Eosinophils Absolute: 0.2 10*3/uL (ref 0.0–0.7)
MCHC: 34 g/dL (ref 30.0–36.0)
MCV: 95 fl (ref 78.0–100.0)
Monocytes Absolute: 0.4 10*3/uL (ref 0.1–1.0)
Neutrophils Relative %: 51 % (ref 43.0–77.0)
Platelets: 313 10*3/uL (ref 150.0–400.0)
RDW: 14 % (ref 11.5–14.6)

## 2010-10-23 LAB — IBC PANEL: Saturation Ratios: 18.7 % — ABNORMAL LOW (ref 20.0–50.0)

## 2010-10-24 ENCOUNTER — Other Ambulatory Visit: Payer: Self-pay | Admitting: Gastroenterology

## 2010-10-24 DIAGNOSIS — K219 Gastro-esophageal reflux disease without esophagitis: Secondary | ICD-10-CM

## 2010-10-24 DIAGNOSIS — D509 Iron deficiency anemia, unspecified: Secondary | ICD-10-CM

## 2010-10-24 DIAGNOSIS — K589 Irritable bowel syndrome without diarrhea: Secondary | ICD-10-CM

## 2010-10-24 LAB — GLIA (IGA/G) + TTG IGA
Gliadin IgA: 5.4 U/mL (ref <20)
Gliadin IgG: 5.3 U/mL (ref <20)

## 2010-10-24 MED ORDER — LUBIPROSTONE 8 MCG PO CAPS: 8.0000 ug | ORAL_CAPSULE | Freq: Two times a day (BID) | ORAL | Status: AC

## 2010-10-24 MED ORDER — AMBULATORY NON FORMULARY MEDICATION: 1.0000 mL | Status: DC | PRN

## 2010-10-24 NOTE — Telephone Encounter (Signed)
rxs both sent to [facility] dc with [facility].

## 2010-10-28 ENCOUNTER — Telehealth: Payer: Self-pay | Admitting: Gastroenterology

## 2010-10-28 NOTE — Telephone Encounter (Signed)
Needs ok to fill for 90 days with 3 refills.

## 2010-10-29 ENCOUNTER — Telehealth: Payer: Self-pay | Admitting: *Deleted

## 2010-10-29 NOTE — Telephone Encounter (Signed)
Pt aware.

## 2010-10-29 NOTE — Telephone Encounter (Signed)
Message copied by Harlow Mares on Wed Oct 29, 2010 12:43 PM ------      Message from: Jarold Motto, DAVID      Created: Wed Oct 29, 2010  8:27 AM       Please call her and tell her all her labs are normal.

## 2010-11-11 NOTE — Assessment & Plan Note (Signed)
[facility]                         GASTROENTEROLOGY OFFICE NOTE   Haley Armstrong, Haley Armstrong                     MRN:          [id number]  DATE:10/13/2007                            DOB:          1945-11-14    HISTORY:  Haley Armstrong has had recurrent episodes of rather severe left  precordial chest pain without real precipitating events.  She is on  chronic Aciphex because of chronic GERD.  She has used some p.r.n.  antacids with mild improvement, but denies associated dysphagia or any  hepatobiliary complaints.  She is status post cholecystectomy.   Because of her current chest pain over the last several months, she has  seen Dr. Posey Rea who obtained Cardiolite stress test which was  apparently normal.  Haley Armstrong has an underlying very strong history of  coronary artery disease in her family with multiple family members and  it sounds like she may have syndrome X.   In the past, I have treated her for acid reflux empirically despite the  fact that she has had a normal 24 hour pH probe study that did not show  any evidence of significant acid reflux.  She has had a normal endoscopy  as recently as February 2007.   PHYSICAL EXAMINATION:  GENERAL:  She is awake and alert in no acute  distress without stigmata of chronic liver disease.  VITAL SIGNS:  She weighs 153 pounds, blood pressure 140/60, pulse 72 and  regular.  NECK:  Unremarkable.  CHEST:  Clear without wheezes or rhonchi.  HEART:  She has a regular rhythm without murmurs, gallops or rubs.  ABDOMEN:  Entirely normal.  MENTAL STATUS:  Clear.   ASSESSMENT:  Haley Armstrong has very atypical chest pain and it certainly sounds  like this might be microvascular angina.  I think she needs a more  thorough cardiac evaluation probably including catheterization.   RECOMMENDATIONS:  1. Continue twice a day Aciphex and p.r.n. Levsin.  2. Cardiology consult as soon as possible.  3. Trial of sublingual nitroglycerin in  the interim.  4. Continue standard antireflux maneuvers empirically.  5. Continue other medications per Dr. Posey Rea.     Vania Rea. Jarold Motto, MD, [facility] Essex, FAGA  Electronically Signed   DRP/MedQ  DD: 10/13/2007  DT: 10/13/2007  Job #: [id number]   cc:   Georgina Quint. [name], MD

## 2010-11-11 NOTE — Assessment & Plan Note (Signed)
[facility]                            CARDIOLOGY OFFICE NOTE   PINA, SIRIANNI                     MRN:          [id number]  DATE:11/07/2007                            DOB:          04-30-1946    IDENTIFICATION:  Ms. Brisbon is a 65 year old woman who comes in on  referral for evaluation of chest pain.  She is followed by Dr. Posey Rea  and Dr. Jarold Motto in GI.   HISTORY OF PRESENT ILLNESS:  The patient has no known history of  coronary artery disease.  She has had four episodes of chest pain, and  she can pretty much date them.  One occurred back in September, while  she was at the beach.  It occurred while she was sitting on a porch,  lasted 15-20 minutes.  She said she thought she could have had a heart  attack with this.  She actually went to the local fire station and an  EKG was done.  The pain eased off on its own.  EKG was reportedly  negative.  In November, she had another spell.  She was at a voting  booth and again had another spell of chest pressure.  This eased off on  its own.  In March, she was asleep and woke up with pain, but again  eased off in its own.   Otherwise, she is very active.  She actually the other day mowed her  lawn which was a 100 x 150 feet.  It was a push mower with bagging.  She  said at the end she was tired, but did not have significant fatigue  while she was doing it.  No chest pain.   ALLERGIES:  NONE.   CURRENT MEDICATIONS:  Levbid daily, enalapril 5, Citrucel daily,  multivitamin, calcium, meloxicam 15, fish oil, Osteo Bi-Flex,  Ferrex  Forte, question Protonix, vitamin B complex.   PAST MEDICAL HISTORY:  1. Irritable bowel.  2. Osteoarthritis.   FAMILY HISTORY:  Significant for brothers, aunts and uncles with  coronary artery disease.   SOCIAL HISTORY:  The patient quit tobacco in 1980.  Drinks rarely.   REVIEW OF SYSTEMS:  Seasonal allergies.  History of anemia in 2007,  hemoglobin 9.4,  in March 2009 hemoglobin of 9.7.  History of reflux.  History of hiatal hernia.  Otherwise all systems reviewed negative to  the above problem except as noted.  Of note, the patient did have a  Myoview scan done back in October that was normal.  Mild breast  attenuation.   PHYSICAL EXAMINATION:  GENERAL:  The patient is currently in no  distress.  VITAL SIGNS:  Blood pressure on my check 140/93, pulse is 91, weight  150.  HEENT:  Normocephalic, atraumatic.  EOMI, PERL.  Mucous membranes are  moist.  NECK:  JVP is normal.  No thyromegaly or bruits.  LUNGS:  Clear to auscultation.  No rales or wheezes.  CARDIAC:  Regular rate and rhythm.  S1-S2.  No S3-S4 or murmurs.  ABDOMEN:  Supple, nontender, normal bowel sounds.  No masses.  No  hepatomegaly.  EXTREMITIES:  Good distal pulses throughout.  No lower  extremity edema.   A 12-lead EKG shows normal sinus rhythm at 86 beats per minute.   IMPRESSION:  Ms. Hartgrove is a 65 year old woman with history of chest  pain.  Dr. Jarold Motto was concerned could she have Prinzmetal, indeed she  may.  Her pain does not sound like typical angina.  I have given her  options.  We could do a cardiac catheterization to confirm no coronary  artery disease, particularly no significant coronary artery disease,  particularly with her family history, but again her symptoms argue  against this.  There is a small risk with the procedure   Alternatively, I could place the patient on a vasodilator and see how  she responds.  Again knowing if she has a spell that is bad, she should  take nitroglycerin and even go to the emergency room.  She seems more  agreeable to proceed with the second option, and I will go ahead and  start her on amlodipine today 2.5, and then 5 mg daily, and I will  follow up in a couple months.  Again, if she has symptoms that worsen,  she should present to the emergency room.  Continue on the other  medicines for now.     Pricilla Riffle, MD, [facility]  Electronically Signed    PVR/MedQ  DD: 11/07/2007  DT: 11/07/2007  Job #: [id number]   cc:   Vania Rea. Jarold Motto, MD, [facility]ale[facility] Essex, FAGA

## 2010-11-14 NOTE — Assessment & Plan Note (Signed)
[facility]                         GASTROENTEROLOGY OFFICE NOTE   NAME:Mccleod, JONNETTE NUON                     MRN:          [id number]  DATE:09/02/2006                            DOB:          [birth year]    PROBLEM LIST:  1. Chronic GERD.  2. History of iron deficiency anemia with workup February of 2007.   HISTORY:  Jenia comes in today for routine followup, needing refills on  her AcipHex.  She does have chronic GERD and has been taking AcipHex  with good success.  She says she has really no symptoms as long as she  stays on her medications.  She denies any complaints of dysphagia or  odynophagia.  She did undergo endoscopy in February of 2007 as part of  her workup for the iron deficiency anemia, and this was a negative exam.  She also had small bowel biopsies done, which were negative.  She had  colonoscopy in February of 2007 as well, which was incomplete, and then  had a barium enema, subsequently, which was negative.  At that time, she  had presented with a hemoglobin of 9, hematocrit of 28, MCV of 69.  I  cannot locate her iron studies at the time of this dictation.  The  patient says that she took slow iron for approximately 2 months after  that diagnosis and that she had her most recent hemoglobin checked  probably 10-12 weeks ago, at which time she donated blood.  She says she  thinks her hemoglobin was 14, because she was okay for a blood donation.  She has not had any iron studies repeated.  She does continue to take  Mobic, but has been trying to take this on an every other day basis  rather than every day, and has been using ibuprofen very infrequently.  She does have difficulty with herniated disks in her neck, which bothers  her, but she is aware that the antiinflammatories may be a problem as  far as her GI tract is concerned.   She has not noted any melena or hematochezia.  Has no complaints of  abdominal pain.   CURRENT  MEDICATIONS:  1. Mobic every other day p.r.n.  2. AcipHex 20 mg daily.  3. Levbid 1 p.o. daily.  4. Enalapril she is uncertain of the mg dosage 1 p.o. daily.  5. Citrucel daily.  6. Multivitamin daily.  7. Calcium supplement daily.  8. Benadryl p.r.n.   ALLERGIES:  No known drug allergies.   EXAM:  Well-developed white female in no acute distress.  Weight is 153.8, blood pressure 130/70, pulse 64.  CARDIOVASCULAR:  Regular rate and rhythm with S1, S2.  No murmurs, rubs,  or gallops.  PULMONARY:  Clear to A and P.  ABDOMEN:  Soft.  Bowel sounds active.  She is nontender.  There is no  palpable mass or hepatosplenomegaly.   IMPRESSION:  87. A 65 year old white female with chronic gastroesophageal reflux      disease, stable.  2. History of iron deficiency anemia with workup in 2007, which was      negative.  She  needs followup CBC and iron studies.   PLAN:  1. Refill AcipHex 20 mg p.o. daily x1 year.  2. Refill Levbid for 1 p.o. q. a.m. p.r.n. and Levsin sublingual      p.r.n.  3. Check CBC, iron TIBC, iron saturation, and ferritin today.  If she      is still iron deficient, will need iron replacement and may need      capsule endoscopy.      Mike Gip, PA-C  Electronically Signed      Vania Rea. Jarold Motto, MD, [facility], FAGA  Electronically Signed   AE/MedQ  DD: 09/02/2006  DT: 09/02/2006  Job #: [id number]

## 2010-11-14 NOTE — Assessment & Plan Note (Signed)
[facility]                           PRIMARY CARE OFFICE NOTE   NAME:Haley Armstrong, Haley Armstrong                     MRN:          [id number]  DATE:09/28/2006                            DOB:          1946/04/14    The patient is a 65 year old female who presents for a well examination.   PAST MEDICAL HISTORY:  Anemia with iron deficiency.   FAMILY HISTORY:  Mother had B-cell lymphoma.  Brothers with coronary  disease.  Father died with Parkinson's.   SOCIAL HISTORY:  She quit smoking a long time ago.  She has been working  12+ hours a day.   REVIEW OF SYSTEMS:  She has been seeing Dr. Senaida Ores for GYN care.  She has been menopausal.  No chest pain or shortness of breath.  No  syncope.  Concerned about the veins in her legs.  Occasional neck pain.  Concerned about a lesion of the left leg.  The rest of the 18-point  review of systems is negative.   PHYSICAL EXAMINATION:  VITAL SIGNS:  Blood pressure 124/76, pulse 84,  temperature 98.3, weight 167 pounds.  GENERAL:  She looks well.  HEENT:  Moist mucosa.  NECK:  Supple, no thyromegaly or bruit.  LUNGS:  Clear.  No wheezes or rales.  CARDIAC:  S1, S2, no murmur, no gallop.  ABDOMEN:  Soft, nontender, without organomegaly or mass felt.  EXTREMITIES:  Lower extremities without edema.  NEUROLOGIC:  She is alert and nonfocal.  PSYCHIATRIC:  Denies being depressed.  SKIN:  Clear.  Skin examination reveals multiple spider veins on the  upper and lower legs.  There is a wart-like lesion with 3 or 4 black  dots in the center on the left lower third of her shin consistent with a  wart.   LABORATORY DATA:  March 28, hemoglobin 12.7.  Glucose 104.  Cholesterol  113.  Urinalysis normal.  EKG with normal sinus rhythm.   ASSESSMENT:  1. Wellness examination.  Age/health-related issues discussed.      Healthy lifestyle discussed.  Needs to cut back on hours, exercise      regularly, regular.  Regular GYN  care/mammogram with Dr.      Senaida Ores.  She will have a bone density scan.  We will check her      vitamin D level later.  She has been counseled on vitamin D.  2. Wart on the left lower leg.  I will treat with cryosurgery.  3. Veins on lower extremities, multiple.  She was instructed to make      an appointment with Dr.      Donia Ast to address the issue.  4. Musculoskeletal neck pain.  She has had physical therapy before.      We will renew physical therapy.  Skelaxin 800 mg p.o. t.i.d. p.r.n.     Georgina Quint. [name], MD  Electronically Signed    AVP/MedQ  DD: 09/30/2006  DT: 09/30/2006  Job #: [id number]

## 2010-11-14 NOTE — Assessment & Plan Note (Signed)
[facility]                           PRIMARY CARE OFFICE NOTE   NAME:Haley Armstrong, Haley Armstrong                     MRN:          [id number]  DATE:09/28/2006                            DOB:          Apr 17, 1946    PROCEDURE:  Cryosurgery.   INDICATIONS:  Wart on the leg.  Risks and benefits were explained to the  patient in detail and she agreed to proceed.   The area was treated with liquid nitrogen in the usual fashion.  Tolerated it well, complications none.  If it is not resolved, we will  treat it again or excise.     Georgina Quint. [name], MD  Electronically Signed    AVP/MedQ  DD: 09/30/2006  DT: 09/30/2006  Job #: [id number]

## 2010-11-19 ENCOUNTER — Other Ambulatory Visit (INDEPENDENT_AMBULATORY_CARE_PROVIDER_SITE_OTHER): Payer: BC Managed Care – PPO | Admitting: Gastroenterology

## 2010-11-19 ENCOUNTER — Other Ambulatory Visit: Payer: BC Managed Care – PPO

## 2010-11-19 DIAGNOSIS — Z1289 Encounter for screening for malignant neoplasm of other sites: Secondary | ICD-10-CM

## 2010-12-27 ENCOUNTER — Other Ambulatory Visit: Payer: Self-pay | Admitting: Internal Medicine

## 2010-12-29 NOTE — Telephone Encounter (Signed)
Ok to Rf? Med is not active

## 2010-12-30 ENCOUNTER — Other Ambulatory Visit: Payer: Self-pay | Admitting: Internal Medicine

## 2010-12-30 ENCOUNTER — Other Ambulatory Visit: Payer: Self-pay

## 2010-12-30 DIAGNOSIS — I10 Essential (primary) hypertension: Secondary | ICD-10-CM

## 2011-01-01 ENCOUNTER — Other Ambulatory Visit: Payer: Self-pay | Admitting: *Deleted

## 2011-01-01 MED ORDER — MELOXICAM 15 MG PO TABS: 7.5000 mg | ORAL_TABLET | Freq: Every day | ORAL | Status: DC

## 2011-01-02 NOTE — Telephone Encounter (Signed)
This was approved already(7/5). Resent

## 2011-01-06 ENCOUNTER — Ambulatory Visit: Payer: Self-pay | Admitting: Internal Medicine

## 2011-03-10 ENCOUNTER — Telehealth: Payer: Self-pay | Admitting: Gastroenterology

## 2011-03-10 NOTE — Telephone Encounter (Signed)
Fixed rx to 15ml not 1ml with pharmacy and pt aware

## 2011-03-11 ENCOUNTER — Other Ambulatory Visit (INDEPENDENT_AMBULATORY_CARE_PROVIDER_SITE_OTHER): Payer: BC Managed Care – PPO

## 2011-03-11 DIAGNOSIS — I10 Essential (primary) hypertension: Secondary | ICD-10-CM

## 2011-03-11 LAB — BASIC METABOLIC PANEL
CO2: 28 mEq/L (ref 19–32)
Calcium: 8.7 mg/dL (ref 8.4–10.5)
Creatinine, Ser: 0.7 mg/dL (ref 0.4–1.2)
Glucose, Bld: 100 mg/dL — ABNORMAL HIGH (ref 70–99)

## 2011-03-16 ENCOUNTER — Ambulatory Visit (INDEPENDENT_AMBULATORY_CARE_PROVIDER_SITE_OTHER): Payer: BC Managed Care – PPO | Admitting: Internal Medicine

## 2011-03-16 ENCOUNTER — Encounter: Payer: Self-pay | Admitting: Internal Medicine

## 2011-03-16 VITALS — BP 140/72 | HR 88 | Temp 98.2°F | Resp 16 | Wt 152.0 lb

## 2011-03-16 DIAGNOSIS — R279 Unspecified lack of coordination: Secondary | ICD-10-CM

## 2011-03-16 DIAGNOSIS — Z23 Encounter for immunization: Secondary | ICD-10-CM

## 2011-03-16 DIAGNOSIS — F411 Generalized anxiety disorder: Secondary | ICD-10-CM

## 2011-03-16 DIAGNOSIS — M545 Low back pain, unspecified: Secondary | ICD-10-CM

## 2011-03-16 DIAGNOSIS — G959 Disease of spinal cord, unspecified: Secondary | ICD-10-CM

## 2011-03-16 DIAGNOSIS — I10 Essential (primary) hypertension: Secondary | ICD-10-CM

## 2011-03-16 DIAGNOSIS — R413 Other amnesia: Secondary | ICD-10-CM

## 2011-03-16 NOTE — Progress Notes (Signed)
  Subjective:    Patient ID: Haley Armstrong, female    DOB: May 13, 1946, 65 y.o.   MRN: [id number]  HPI  The patient presents for a follow-up of  chronic hypertension, chronic dyslipidemia, anxietycontrolled with medicines    Review of Systems  Constitutional: Negative for chills, activity change, appetite change, fatigue and unexpected weight change.  HENT: Negative for congestion, mouth sores and sinus pressure.   Eyes: Negative for visual disturbance.  Respiratory: Negative for cough and chest tightness.   Gastrointestinal: Negative for nausea and abdominal pain.  Genitourinary: Negative for frequency, difficulty urinating and vaginal pain.  Musculoskeletal: Negative for back pain and gait problem.  Skin: Negative for pallor and rash.  Neurological: Negative for dizziness, tremors, weakness, numbness and headaches.  Psychiatric/Behavioral: Positive for decreased concentration. Negative for confusion and sleep disturbance.       Objective:   Physical Exam  Constitutional: She appears well-developed and well-nourished. No distress.  HENT:  Head: Normocephalic.  Right Ear: External ear normal.  Left Ear: External ear normal.  Nose: Nose normal.  Mouth/Throat: Oropharynx is clear and moist.  Eyes: Conjunctivae are normal. Pupils are equal, round, and reactive to light. Right eye exhibits no discharge. Left eye exhibits no discharge.  Neck: Normal range of motion. Neck supple. No JVD present. No tracheal deviation present. No thyromegaly present.  Cardiovascular: Normal rate, regular rhythm and normal heart sounds.   Pulmonary/Chest: No stridor. No respiratory distress. She has no wheezes.  Abdominal: Soft. Bowel sounds are normal. She exhibits no distension and no mass. There is no tenderness. There is no rebound and no guarding.  Musculoskeletal: She exhibits no edema and no tenderness.  Lymphadenopathy:    She has no cervical adenopathy.  Neurological: She displays normal  reflexes. No cranial nerve deficit. She exhibits normal muscle tone. Coordination normal.  Skin: No rash noted. No erythema.  Psychiatric: Her behavior is normal. Judgment and thought content normal.    Wt Readings from Last 3 Encounters:  03/16/11 152 lb (68.947 kg)  10/21/10 151 lb (68.493 kg)  07/08/10 151 lb (68.493 kg)   Lab Results  Component Value Date   WBC 5.3 10/23/2010   HGB 13.4 10/23/2010   HCT 39.2 10/23/2010   PLT 313.0 10/23/2010   CHOL 184 07/01/2010   TRIG 52.0 07/01/2010   HDL 63.80 07/01/2010   ALT 19 10/23/2010   AST 24 10/23/2010   NA 140 03/11/2011   K 4.9 03/11/2011   CL 106 03/11/2011   CREATININE 0.7 03/11/2011   BUN 18 03/11/2011   CO2 28 03/11/2011   TSH 2.78 10/23/2010   INR 0.90 08/05/2009   HGBA1C 5.7 05/20/2007         Assessment & Plan:

## 2011-03-16 NOTE — Assessment & Plan Note (Signed)
Continue with current prescription therapy as reflected on the Med list.  

## 2011-03-16 NOTE — Assessment & Plan Note (Signed)
Chronic  Potential benefits of a long term benzodiazepines  use as well as potential risks  and complications were explained to the patient and were aknowledged. 

## 2011-03-16 NOTE — Assessment & Plan Note (Signed)
Not on rx Mostly long term memory issues - mild

## 2011-06-29 ENCOUNTER — Other Ambulatory Visit: Payer: Self-pay | Admitting: Internal Medicine

## 2011-06-29 ENCOUNTER — Other Ambulatory Visit: Payer: Self-pay | Admitting: Gastroenterology

## 2011-10-15 ENCOUNTER — Ambulatory Visit (INDEPENDENT_AMBULATORY_CARE_PROVIDER_SITE_OTHER): Payer: BC Managed Care – PPO | Admitting: Internal Medicine

## 2011-10-15 ENCOUNTER — Encounter: Payer: Self-pay | Admitting: Internal Medicine

## 2011-10-15 VITALS — BP 132/62 | HR 69 | Temp 97.1°F | Ht 66.5 in | Wt 153.5 lb

## 2011-10-15 DIAGNOSIS — F411 Generalized anxiety disorder: Secondary | ICD-10-CM

## 2011-10-15 DIAGNOSIS — M5416 Radiculopathy, lumbar region: Secondary | ICD-10-CM

## 2011-10-15 DIAGNOSIS — IMO0002 Reserved for concepts with insufficient information to code with codable children: Secondary | ICD-10-CM

## 2011-10-15 DIAGNOSIS — I10 Essential (primary) hypertension: Secondary | ICD-10-CM

## 2011-10-15 MED ORDER — METHOCARBAMOL 500 MG PO TABS: 500.0000 mg | ORAL_TABLET | Freq: Three times a day (TID) | ORAL | Status: AC | PRN

## 2011-10-15 MED ORDER — PREDNISONE 10 MG PO TABS: ORAL_TABLET | ORAL | Status: DC

## 2011-10-15 NOTE — Patient Instructions (Addendum)
Take all new medications as prescribed - the robaxin, and prednisone Continue all other medications as before, including the vicodin 5/500 as you have at home

## 2011-10-18 ENCOUNTER — Encounter: Payer: Self-pay | Admitting: Internal Medicine

## 2011-10-18 DIAGNOSIS — M5416 Radiculopathy, lumbar region: Secondary | ICD-10-CM | POA: Insufficient documentation

## 2011-10-18 NOTE — Assessment & Plan Note (Signed)
stable overall by hx and exam, most recent data reviewed with pt, and pt to continue medical treatment as before BP Readings from Last 3 Encounters:  10/15/11 132/62  03/16/11 140/72  10/21/10 128/64

## 2011-10-18 NOTE — Assessment & Plan Note (Signed)
stable overall by hx and exam, and pt to continue medical treatment as before 

## 2011-10-18 NOTE — Assessment & Plan Note (Signed)
Acute moderate with mild LLE weakness, I think needs an MRI and surgical eval, but she wishes to pursue more conservative tx to start, with robaxin, predisone and cont'c home vicodin prn, has appt to f/u with PCP may 13, I urged to call or return sooner if pain persists or worsens

## 2011-10-18 NOTE — Progress Notes (Signed)
Subjective:    Patient ID: Haley Armstrong, female    DOB: 29-Sep-1945, 66 y.o.   MRN: [id number]  HPI  Here with acute onset left LBP with radiation to the distal LLE after doing yard work 1 wk ago, midl to [employer] though worse in severity of pain last 2 days despite mobic and vicodin, but no bowel or bladder change, fever, wt loss, gait change or falls.  Pain worse at night and sitting, better with being up and walking about a bit until last 2 days, current mobic and vicodin at home not working so well.  Has hx of right troch bursitis, and c4-5 fusion.   Past Medical History  Diagnosis Date  . GERD (gastroesophageal reflux disease)   . Osteoarthritis   . Anemia, iron deficiency   . Hypertension   . Blood donor   . Anxiety   . IBS (irritable bowel syndrome)    Past Surgical History  Procedure Date  . Appendectomy   . Cholecystectomy   . Cervical fusion 2011    C4-5 Dr Alveda Reasons    reports that she has quit smoking. She has never used smokeless tobacco. She reports that she drinks alcohol. She reports that she does not use illicit drugs. family history includes Cancer (age of onset:70) in her mother; Dementia in her mother; Heart disease in her brother; Lymphoma in her mother; and Parkinsonism in her father.  There is no history of Colon cancer. No Known Allergies Current Outpatient Prescriptions on File Prior to Visit  Medication Sig Dispense Refill  . AMBULATORY NON FORMULARY MEDICATION Take 1 mL by mouth as needed. GI Cocktail--- Equal parts Maalox, Donnatal, and Xylocain  1200 mL  3  . amLODipine (NORVASC) 5 MG tablet Take 5 mg by mouth daily. Takes 1/2 tablet      . APAP-Isometheptene-Dichloral (EPIDRIN) 325-65-100 MG CAPS Take by mouth 2 (two) times daily as needed.        Marland Kitchen aspirin 81 MG EC tablet Take 81 mg by mouth 2 (two) times daily.        Marland Kitchen b complex vitamins tablet Take 1 tablet by mouth daily.        . Cholecalciferol 1000 UNITS tablet Take 1,000 Units by mouth daily.         . Diclofenac Sodium (PENNSAID) 1.5 % SOLN Place 3-5 drops onto the skin 3 (three) times daily as needed.        . enalapril (VASOTEC) 5 MG tablet TAKE 1 TABLET DAILY  90 tablet  1  . fish oil-omega-3 fatty acids 1000 MG capsule Take 1 g by mouth daily.        . fluocinonide (LIDEX) 0.05 % cream Apply topically as directed.        . hyoscyamine (LEVBID) 0.375 MG 12 hr tablet Take 1 tablet (0.375 mg total) by mouth every 12 (twelve) hours as needed for cramping.  60 tablet  3  . LORazepam (ATIVAN) 0.5 MG tablet Take 0.5 mg by mouth 2 (two) times daily as needed. For anxiety       . meloxicam (MOBIC) 15 MG tablet TAKE ONE-HALF (1/2) OR 1 TABLET ONCE DAILY AFTER A MEAL AS NEEDED  90 tablet  2  . meloxicam (MOBIC) 15 MG tablet Take 0.5-1 tablets (7.5-15 mg total) by mouth daily.  90 tablet  1  . metaxalone (SKELAXIN) 800 MG tablet Take 800 mg by mouth 3 (three) times daily as needed.        Marland Kitchen  Methylcellulose, Laxative, (CITRUCEL PO) Take by mouth. One once daily       . Multiple Vitamins-Calcium (VIACTIV MULTI-VITAMIN) CHEW Chew by mouth 2 (two) times daily.        . Multiple Vitamins-Minerals (MULTIVITAMIN,TX-MINERALS) tablet Take 1 tablet by mouth daily.        . pantoprazole (PROTONIX) 40 MG tablet TAKE 1 TABLET DAILY  90 tablet  10  . Sennosides (SENNA LAX PO) Take by mouth. As needed        Review of Systems Review of Systems  Constitutional: Negative for diaphoresis and unexpected weight change.  Gastrointestinal: Negative for vomiting and blood in stool.  Genitourinary: Negative for hematuria and decreased urine volume.  Musculoskeletal: Negative for gait problem.  Skin: Negative for color change and wound.  Neurological: Negative for tremors and numbness.  Psychiatric/Behavioral: Negative for decreased concentration. The patient is not hyperactive.       Objective:   Physical Exam BP 132/62  Pulse 69  Temp(Src) 97.1 F (36.2 C) (Oral)  Ht 5' 6.5" (1.689 m)  Wt 153 lb 8 oz  (69.627 kg)  BMI 24.40 kg/m2  SpO2 98% Physical Exam  VS noted, not ill appearing Constitutional: Pt appears well-developed and well-nourished.  HENT: Head: Normocephalic.  Right Ear: External ear normal.  Left Ear: External ear normal.  Eyes: Conjunctivae and EOM are normal. Pupils are equal, round, and reactive to light.  Neck: Normal range of motion. Neck supple.  Cardiovascular: Normal rate and regular rhythm.   Pulmonary/Chest: Effort normal and breath sounds normal.  Abd:  Soft, NT, non-distended, + BS Spine nontender, but tender to left paravertebral and left SI joint area Neurological: Pt is alert. No cranial nerve deficit. motor 4+ 5 distal LLE, sens/dtr/gait intact Skin: Skin is warm. No erythema. No rash Psychiatric: Pt behavior is normal. Thought content normal.1+ nervous     Assessment & Plan:

## 2011-10-20 ENCOUNTER — Ambulatory Visit: Payer: BC Managed Care – PPO | Admitting: Internal Medicine

## 2011-10-21 ENCOUNTER — Telehealth: Payer: Self-pay

## 2011-10-21 NOTE — Telephone Encounter (Signed)
Pt called stating that she is experiencing tightness in her shoulders, neck and chest. She believes it is muscular and has taken 1 metaxalone but now her PB is elevated - 176/84. Pt is requesting advisement from MD.

## 2011-10-22 NOTE — Telephone Encounter (Signed)
Pt informed. She states she doubled up and Enalapril and took an extra muscle relaxer at bedtime and is feeling better today. She states she is going to continue to take 2 Enalapril 5 mg for a few days.

## 2011-10-22 NOTE — Telephone Encounter (Signed)
Agree. Thx 

## 2011-10-22 NOTE — Telephone Encounter (Signed)
Ok to take a muscle relaxant. OV w/any MD if not better Thx

## 2011-10-22 NOTE — Telephone Encounter (Signed)
Left mess for patient to call back.  

## 2011-11-02 ENCOUNTER — Ambulatory Visit (INDEPENDENT_AMBULATORY_CARE_PROVIDER_SITE_OTHER): Payer: Medicare Other | Admitting: Family Medicine

## 2011-11-02 DIAGNOSIS — R079 Chest pain, unspecified: Secondary | ICD-10-CM

## 2011-11-02 NOTE — Progress Notes (Signed)
Patient Name: Haley Armstrong Date of Birth: [birth year] Medical Record Number: [id number] Gender: female Date of Encounter: 11/02/2011  History of Present Illness:  Haley Armstrong is a 66 y.o. very pleasant female patient who presents with the following:  Here today with CP which started at around 6pm. She wonders if it might be due to GERD. She has an Rx for GI cocktail but does not have this medication with her.  "A GI cocktail sounds wonderful."  She has noted that her BP has been higher than usual for the last two weeks.  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  She has a history of HTN and has noted that her BP has been elevated recently up to 160- 180/ 70- 90.  She had CP about 5 years ago and had a negative stress test.  Since then she occasionally notes CP, but never with exertion.  She takes enalapirl 5mg  and norvasc 2.5mg  daily for mild HTN- usually this is enough to control her BP.    There is a strong family history of CAD on her father's side- her brother and her father have both had CAD and surgery.  There are multiple people with MI history in her family.   She quit tobacco 30 years ago.    Patient Active Problem List  Diagnoses  . Goiter, unspecified  . ANXIETY  . HYPERTENSION  . GERD  . Irritable bowel syndrome  . OSTEOARTHRITIS  . Cervicalgia  . LOW BACK PAIN  . TROCHANTERIC BURSITIS  . FATIGUE, ACUTE  . Memory loss  . PARESTHESIA  . Rash and other nonspecific skin eruption  . Edema  . Diarrhea  . ABDOMINAL PAIN-EPIGASTRIC  . Other abnormal glucose  . NONSPEC ABN FINDNG RAD & OTH EXAM ABDOMINAL AREA  . TOBACCO USE, QUIT  . Iron deficiency anemia  . History of esophageal spasm  . Acute left lumbar radiculopathy   Past Medical History  Diagnosis Date  . GERD (gastroesophageal reflux disease)   . Osteoarthritis   . Anemia, iron deficiency   . Hypertension   . Blood donor   . Anxiety   .  IBS (irritable bowel syndrome)    Past Surgical History  Procedure Date  . Appendectomy   . Cholecystectomy   . Cervical fusion 2011    C4-5 Dr Alveda Reasons   History  Substance Use Topics  . Smoking status: Former Games developer  . Smokeless tobacco: Never Used  . Alcohol Use: Yes     once a week    Family History  Problem Relation Age of Onset  . Lymphoma Mother   . Dementia Mother   . Cancer Mother 24    uterine sarcoma  . Parkinsonism Father   . Heart disease Brother     CAD  . Colon cancer Neg Hx    No Known Allergies  Medication list has been reviewed and updated.  Review of Systems: As per HPI- otherwise negative. No SOB  Physical Examination: Filed Vitals:   11/02/11 1902  BP: 166/82  Pulse: 87  Temp: 97.8 F (36.6 C)  TempSrc: Oral  Resp: 16  Height: 5' 5.5" (1.664 m)  Weight: 152 lb (68.947 kg)  SpO2: 99%    Body mass index is 24.91 kg/(m^2).  GEN: WDWN, NAD, Non-toxic, A & O x 3 HEENT: Atraumatic, Normocephalic. Neck supple. No masses, No LAD. Tm, oropharynx wnl Ears and Nose: No external deformity. CV: RRR,  No M/G/R. No JVD. No thrill. No extra heart sounds. PULM: CTA B, no wheezes, crackles, rhonchi. No retractions. No resp. distress. No accessory muscle use. ABD: S, ND, +BS. No rebound. No HSM. Slightly tender in epigastric area.  EXTR: No c/c/e NEURO Normal gait.  PSYCH: Normally interactive. Conversant. Quite uncomfortable after GI cocktail- after comfortable and much happier.   EKG: SR, no St elevation or depression.  Small RSR' in V1  Gave a GI cocktail- CP resolved within minutes.   Assessment and Plan: 1. Chest pain  Ambulatory referral to Cardiology   CP due to GERD today.  Resolved after GI cocktail.  However, she has noted CP on occasion over the last several years and she has a very strong Fhx of CAD.  Will refer back to cardiology for follow-up.  Also may increaser her norvasc to 5mg  daily, and can increase her enalapril to 10mg  daily if  needed to keep her BP below about 140/ 90.  If she has any other CP that does not resolve with GI cocktail, or that occurs with exertion please seek help right away.

## 2011-11-05 ENCOUNTER — Encounter: Payer: Self-pay | Admitting: Family Medicine

## 2011-11-09 ENCOUNTER — Ambulatory Visit (INDEPENDENT_AMBULATORY_CARE_PROVIDER_SITE_OTHER): Payer: 59 | Admitting: Internal Medicine

## 2011-11-09 ENCOUNTER — Encounter: Payer: Self-pay | Admitting: Internal Medicine

## 2011-11-09 VITALS — BP 150/80 | HR 85 | Temp 97.7°F | Wt 154.0 lb

## 2011-11-09 DIAGNOSIS — M5416 Radiculopathy, lumbar region: Secondary | ICD-10-CM

## 2011-11-09 DIAGNOSIS — R609 Edema, unspecified: Secondary | ICD-10-CM

## 2011-11-09 DIAGNOSIS — IMO0002 Reserved for concepts with insufficient information to code with codable children: Secondary | ICD-10-CM

## 2011-11-09 DIAGNOSIS — I10 Essential (primary) hypertension: Secondary | ICD-10-CM

## 2011-11-09 DIAGNOSIS — M545 Low back pain: Secondary | ICD-10-CM

## 2011-11-09 MED ORDER — ENALAPRIL MALEATE 5 MG PO TABS: 5.0000 mg | ORAL_TABLET | Freq: Two times a day (BID) | ORAL | Status: DC

## 2011-11-09 MED ORDER — AMLODIPINE BESYLATE 5 MG PO TABS: 5.0000 mg | ORAL_TABLET | Freq: Every day | ORAL | Status: DC

## 2011-11-09 NOTE — Assessment & Plan Note (Signed)
Failed Prednisone F/u w/ortho

## 2011-11-09 NOTE — Assessment & Plan Note (Signed)
LLE radiculopathy -- MRI is pending tonight (Dr Melrose Nakayama) On percocet PRN

## 2011-11-09 NOTE — Progress Notes (Signed)
  Subjective:    Patient ID: Haley Armstrong, female    DOB: Aug 14, 1945, 66 y.o.   MRN: [id number]  HPI  The patient presents for a follow-up of  chronic hypertension - worse, chronic dyslipidemia, anxiety controlled with medicines C/o LLE pain - severe lately with a LBP  - seen Dr Melrose Nakayama, failed prednisone x 2   Review of Systems  Constitutional: Negative for chills, activity change, appetite change, fatigue and unexpected weight change.  HENT: Negative for congestion, mouth sores and sinus pressure.   Eyes: Negative for visual disturbance.  Respiratory: Negative for cough and chest tightness.   Gastrointestinal: Negative for nausea and abdominal pain.  Genitourinary: Negative for frequency, difficulty urinating and vaginal pain.  Musculoskeletal: Positive for back pain and gait problem.  Skin: Negative for pallor and rash.  Neurological: Negative for dizziness, tremors, weakness, numbness and headaches.  Psychiatric/Behavioral: Negative for confusion, sleep disturbance and decreased concentration.       Objective:   Physical Exam  Constitutional: She appears well-developed and well-nourished. No distress.  HENT:  Head: Normocephalic.  Right Ear: External ear normal.  Left Ear: External ear normal.  Nose: Nose normal.  Mouth/Throat: Oropharynx is clear and moist.  Eyes: Conjunctivae are normal. Pupils are equal, round, and reactive to light. Right eye exhibits no discharge. Left eye exhibits no discharge.  Neck: Normal range of motion. Neck supple. No JVD present. No tracheal deviation present. No thyromegaly present.  Cardiovascular: Normal rate, regular rhythm and normal heart sounds.   Pulmonary/Chest: No stridor. No respiratory distress. She has no wheezes.  Abdominal: Soft. Bowel sounds are normal. She exhibits no distension and no mass. There is no tenderness. There is no rebound and no guarding.  Musculoskeletal: She exhibits tenderness (LS is tender; str leg is +/- on  L). She exhibits no edema.  Lymphadenopathy:    She has no cervical adenopathy.  Neurological: She displays normal reflexes. No cranial nerve deficit. She exhibits normal muscle tone. Coordination normal.  Skin: No rash noted. No erythema.  Psychiatric: Her behavior is normal. Judgment and thought content normal.    Wt Readings from Last 3 Encounters:  11/09/11 154 lb (69.854 kg)  11/02/11 152 lb (68.947 kg)  10/15/11 153 lb 8 oz (69.627 kg)   Lab Results  Component Value Date   WBC 5.3 10/23/2010   HGB 13.4 10/23/2010   HCT 39.2 10/23/2010   PLT 313.0 10/23/2010   CHOL 184 07/01/2010   TRIG 52.0 07/01/2010   HDL 63.80 07/01/2010   ALT 19 10/23/2010   AST 24 10/23/2010   NA 140 03/11/2011   K 4.9 03/11/2011   CL 106 03/11/2011   CREATININE 0.7 03/11/2011   BUN 18 03/11/2011   CO2 28 03/11/2011   TSH 2.78 10/23/2010   INR 0.90 08/05/2009   HGBA1C 5.7 05/20/2007         Assessment & Plan:

## 2011-11-09 NOTE — Assessment & Plan Note (Signed)
sub optimal control -- will increase meds

## 2011-11-09 NOTE — Assessment & Plan Note (Signed)
resolved 

## 2011-11-30 ENCOUNTER — Telehealth: Payer: Self-pay | Admitting: Gastroenterology

## 2011-11-30 NOTE — Telephone Encounter (Signed)
Pt with hx of esophageal spasms that she takes GI cocktails for prn, called to report she is having a different type of pain or spasm that draws her over and takes her breath away. The pain is midline below her breastbone. The pain is not constant and can last from a few minutes to as many as 10 minutes. She has had a cholecystectomy. She is having sciatic nerve pain and today will have an injection. She thinks she has a hernia and when asked what kind of hernia, she thinks it's muscular. She will see Mike Gip, PA tomorrow. Pt will call for worsening pain.

## 2011-12-01 ENCOUNTER — Ambulatory Visit: Payer: 59 | Admitting: Physician Assistant

## 2011-12-21 ENCOUNTER — Ambulatory Visit: Payer: BC Managed Care – PPO | Admitting: Internal Medicine

## 2012-01-13 ENCOUNTER — Other Ambulatory Visit: Payer: Self-pay

## 2012-01-13 ENCOUNTER — Other Ambulatory Visit: Payer: Self-pay | Admitting: Gastroenterology

## 2012-01-13 MED ORDER — PANTOPRAZOLE SODIUM 40 MG PO TBEC: 40.0000 mg | DELAYED_RELEASE_TABLET | Freq: Every day | ORAL | Status: DC

## 2012-01-13 MED ORDER — ENALAPRIL MALEATE 5 MG PO TABS: 5.0000 mg | ORAL_TABLET | Freq: Two times a day (BID) | ORAL | Status: DC

## 2012-01-13 NOTE — Telephone Encounter (Signed)
rx sent. Patient needs office visit for further refills. 

## 2012-01-25 ENCOUNTER — Ambulatory Visit: Payer: Self-pay | Admitting: Internal Medicine

## 2012-02-01 ENCOUNTER — Ambulatory Visit: Payer: Self-pay | Admitting: Internal Medicine

## 2012-02-26 ENCOUNTER — Telehealth: Payer: Self-pay | Admitting: Internal Medicine

## 2012-02-26 NOTE — Telephone Encounter (Signed)
Caller: Ashleynicole/Patient; Patient Name: Haley Armstrong; PCP: Sonda Primes (Adults only); Best Callback Phone Number: [phone].  Call regarding Depression, onset "all along, Dr Posey Rea knows my situation with my sick brother and 65 year old Mother".   Patient recently started Lyrcia for Cyst on Spine 2 months ago. Patient leaving town on 8-31 would like script called into to [facility], [phone].  Patient last seen at office on 5-13.  All emergent symptoms ruled out per Depression Protocol, see in 24 hours, Per Patient unable to be evaluated, leaving town.  Please follow up if something can be called in for depression.

## 2012-03-01 ENCOUNTER — Ambulatory Visit (INDEPENDENT_AMBULATORY_CARE_PROVIDER_SITE_OTHER): Payer: 59 | Admitting: Gastroenterology

## 2012-03-01 ENCOUNTER — Other Ambulatory Visit (INDEPENDENT_AMBULATORY_CARE_PROVIDER_SITE_OTHER): Payer: 59

## 2012-03-01 ENCOUNTER — Encounter: Payer: Self-pay | Admitting: Gastroenterology

## 2012-03-01 VITALS — BP 136/80 | HR 68 | Ht 65.5 in | Wt 153.0 lb

## 2012-03-01 DIAGNOSIS — Z9049 Acquired absence of other specified parts of digestive tract: Secondary | ICD-10-CM

## 2012-03-01 DIAGNOSIS — Z9089 Acquired absence of other organs: Secondary | ICD-10-CM

## 2012-03-01 DIAGNOSIS — R109 Unspecified abdominal pain: Secondary | ICD-10-CM

## 2012-03-01 DIAGNOSIS — Z8719 Personal history of other diseases of the digestive system: Secondary | ICD-10-CM

## 2012-03-01 DIAGNOSIS — Z79899 Other long term (current) drug therapy: Secondary | ICD-10-CM

## 2012-03-01 DIAGNOSIS — K219 Gastro-esophageal reflux disease without esophagitis: Secondary | ICD-10-CM

## 2012-03-01 LAB — CBC WITH DIFFERENTIAL/PLATELET
Eosinophils Absolute: 0.1 10*3/uL (ref 0.0–0.7)
Eosinophils Relative: 2.1 % (ref 0.0–5.0)
Lymphocytes Relative: 27.2 % (ref 12.0–46.0)
MCV: 94.8 fl (ref 78.0–100.0)
Monocytes Absolute: 0.5 10*3/uL (ref 0.1–1.0)
Neutrophils Relative %: 60.5 % (ref 43.0–77.0)
Platelets: 224 10*3/uL (ref 150.0–400.0)
WBC: 5.2 10*3/uL (ref 4.5–10.5)

## 2012-03-01 LAB — SEDIMENTATION RATE: Sed Rate: 10 mm/hr (ref 0–22)

## 2012-03-01 LAB — VITAMIN B12: Vitamin B-12: 1179 pg/mL — ABNORMAL HIGH (ref 211–911)

## 2012-03-01 LAB — LIPASE: Lipase: 26 U/L (ref 11.0–59.0)

## 2012-03-01 LAB — TSH: TSH: 3.55 u[IU]/mL (ref 0.35–5.50)

## 2012-03-01 LAB — BASIC METABOLIC PANEL
BUN: 16 mg/dL (ref 6–23)
CO2: 28 mEq/L (ref 19–32)
Chloride: 105 mEq/L (ref 96–112)
Creatinine, Ser: 0.8 mg/dL (ref 0.4–1.2)
Glucose, Bld: 94 mg/dL (ref 70–99)
Potassium: 5.4 mEq/L — ABNORMAL HIGH (ref 3.5–5.1)

## 2012-03-01 LAB — HEPATIC FUNCTION PANEL
ALT: 20 U/L (ref 0–35)
AST: 28 U/L (ref 0–37)
Total Bilirubin: 0.5 mg/dL (ref 0.3–1.2)
Total Protein: 6.4 g/dL (ref 6.0–8.3)

## 2012-03-01 LAB — IBC PANEL
Iron: 50 ug/dL (ref 42–145)
Transferrin: 255.7 mg/dL (ref 212.0–360.0)

## 2012-03-01 MED ORDER — ESCITALOPRAM OXALATE 10 MG PO TABS: 10.0000 mg | ORAL_TABLET | Freq: Every day | ORAL | Status: DC

## 2012-03-01 NOTE — Telephone Encounter (Signed)
Lexapro qd F/u in 4-6 wks Thx

## 2012-03-01 NOTE — Telephone Encounter (Signed)
Pt informed/transferred to scheduler.  

## 2012-03-01 NOTE — Progress Notes (Signed)
This is a 66 year old Caucasian female with chronic GERD and chronic IBS. She recently one month ago had an episode of severe subxiphoid pain radiating to her back causing visit to primary urgent care. Apparently cardiac evaluation and EKG at that time were unremarkable. She was scheduled to see Dr. Dietrich Pates, but did not keep that appointment. She is on multiple cardiac medications, is status post cholecystectomy, has chronic acid reflux treated with Protonix, and recently has had increased stress. The patient in the past been prescribed GI cocktail for these episodes with good response. She denies specific hepatobiliary or lower gastrointestinal issues. Also the patient denies fever, chills, anorexia, weight loss, or other systemic complaints. In the past as had third GI evaluation including within the last several years endoscopy, MRI of the upper abdomen, and ultrasound.  Current Medications, Allergies, Past Medical History, Past Surgical History, Family History and Social History were reviewed in [employer] record.  Pertinent Review of Systems Negative   Physical Exam: Healthy-appearing patient in no distress. I cannot appreciate stigmata of chronic liver disease. Blood pressure 136/80, pulse is 60 and regular, and weight 153 pounds with a BMI of 25.07. Chest is clear and she appear to be in a regular rhythm without murmurs gallops or rubs. I cannot appreciate hepatosplenomegaly, abdominal masses, tenderness, and bowel sounds are normal. Peripheral extremities are unremarkable and mental status is normal.    Assessment and Plan: Episode most consistent with esophageal spasm associated with her chronic IBS. This patient is on a variety of medications for anxiety, and chronic pain syndrome. However repeat her liver profile, upper nominal ultrasound exam, and have asked continue daily Protonix with when necessary Levbid and GI cocktail. Also advised cardiac evaluation per Dr.  Tenny Craw as previously scheduled. Last endoscopy in October 2010 was fairly unremarkable including an esophageal biopsies which did not show evidence of eosinophilic esophagitis. Encounter Diagnosis  Name Primary?  . Abdominal  pain, other specified site Yes

## 2012-03-01 NOTE — Patient Instructions (Addendum)
Your physician has requested that you go to the basement for the following lab work before leaving today: You have been scheduled for an abdominal ultrasound at [facility] (1st floor of hospital) on 03-04-12 at 8:30 am. Please arrive 15 minutes prior to your appointment for registration. Make certain not to have anything to eat or drink 6 hours prior to your appointment. Should you need to reschedule your appointment, please contact radiology at [phone].  CC:  Sonda Primes, M.D.

## 2012-03-04 ENCOUNTER — Other Ambulatory Visit (HOSPITAL_COMMUNITY): Payer: 59

## 2012-03-07 ENCOUNTER — Other Ambulatory Visit: Payer: Self-pay

## 2012-03-07 MED ORDER — PANTOPRAZOLE SODIUM 40 MG PO TBEC: 40.0000 mg | DELAYED_RELEASE_TABLET | Freq: Every day | ORAL | Status: DC

## 2012-03-09 ENCOUNTER — Ambulatory Visit (HOSPITAL_COMMUNITY)
Admission: RE | Admit: 2012-03-09 | Discharge: 2012-03-09 | Disposition: A | Discharge status: Home / Self Care | Payer: Medicare Other | Source: Ambulatory Visit | Attending: Gastroenterology | Admitting: Gastroenterology

## 2012-03-09 DIAGNOSIS — Z9089 Acquired absence of other organs: Secondary | ICD-10-CM | POA: Insufficient documentation

## 2012-03-09 DIAGNOSIS — R109 Unspecified abdominal pain: Secondary | ICD-10-CM

## 2012-04-06 ENCOUNTER — Ambulatory Visit: Payer: 59 | Admitting: Internal Medicine

## 2012-04-20 ENCOUNTER — Other Ambulatory Visit: Payer: Self-pay | Admitting: *Deleted

## 2012-04-20 MED ORDER — METAXALONE 800 MG PO TABS: 800.0000 mg | ORAL_TABLET | Freq: Three times a day (TID) | ORAL | Status: DC | PRN

## 2012-05-03 ENCOUNTER — Other Ambulatory Visit: Payer: Self-pay | Admitting: *Deleted

## 2012-05-03 MED ORDER — HYOSCYAMINE SULFATE ER 0.375 MG PO TB12: 0.3750 mg | ORAL_TABLET | Freq: Two times a day (BID) | ORAL | Status: DC | PRN

## 2012-05-30 ENCOUNTER — Other Ambulatory Visit: Payer: Self-pay | Admitting: *Deleted

## 2012-05-30 MED ORDER — MELOXICAM 15 MG PO TABS: 7.5000 mg | ORAL_TABLET | Freq: Every day | ORAL | Status: DC

## 2012-06-15 ENCOUNTER — Encounter: Payer: Self-pay | Admitting: Internal Medicine

## 2012-06-15 ENCOUNTER — Ambulatory Visit (INDEPENDENT_AMBULATORY_CARE_PROVIDER_SITE_OTHER): Payer: Medicare Other | Admitting: Internal Medicine

## 2012-06-15 VITALS — BP 140/80 | HR 72 | Temp 97.4°F | Resp 16 | Wt 153.0 lb

## 2012-06-15 DIAGNOSIS — I10 Essential (primary) hypertension: Secondary | ICD-10-CM

## 2012-06-15 DIAGNOSIS — F605 Obsessive-compulsive personality disorder: Secondary | ICD-10-CM

## 2012-06-15 DIAGNOSIS — F988 Other specified behavioral and emotional disorders with onset usually occurring in childhood and adolescence: Secondary | ICD-10-CM

## 2012-06-15 DIAGNOSIS — R1013 Epigastric pain: Secondary | ICD-10-CM

## 2012-06-15 MED ORDER — ENALAPRIL MALEATE 5 MG PO TABS: 5.0000 mg | ORAL_TABLET | Freq: Two times a day (BID) | ORAL | Status: DC

## 2012-06-15 MED ORDER — BUPROPION HCL ER (XL) 150 MG PO TB24: 150.0000 mg | ORAL_TABLET | Freq: Every day | ORAL | Status: DC

## 2012-06-15 NOTE — Assessment & Plan Note (Signed)
Discussed  Try Wellbutrin Psychol ref was suggested

## 2012-06-15 NOTE — Assessment & Plan Note (Signed)
BP Readings from Last 3 Encounters:  06/15/12 140/80  03/01/12 136/80  11/09/11 150/80

## 2012-06-15 NOTE — Assessment & Plan Note (Signed)
Discussed options CT offered

## 2012-06-15 NOTE — Progress Notes (Signed)
   Subjective:    Patient ID: Haley Armstrong, female    DOB: 03-06-1946, 66 y.o.   MRN: [id number]  HPI  C/o episodes of severe epig pain (saw Dr Jarold Motto) - sporadic - short (10 min) - ery severe x 6 episodes. The last one on Sun 3 am The patient presents for a follow-up of  chronic hypertension - worse, chronic dyslipidemia, anxiety controlled with medicines    Review of Systems  Constitutional: Negative for chills, activity change, appetite change, fatigue and unexpected weight change.  HENT: Negative for congestion, mouth sores and sinus pressure.   Eyes: Negative for visual disturbance.  Respiratory: Negative for cough and chest tightness.   Gastrointestinal: Negative for nausea and abdominal pain.  Genitourinary: Negative for frequency, difficulty urinating and vaginal pain.  Musculoskeletal: Positive for back pain and gait problem.  Skin: Negative for pallor and rash.  Neurological: Negative for dizziness, tremors, weakness, numbness and headaches.  Psychiatric/Behavioral: Negative for confusion, sleep disturbance and decreased concentration.       Objective:   Physical Exam  Constitutional: She appears well-developed and well-nourished. No distress.  HENT:  Head: Normocephalic.  Right Ear: External ear normal.  Left Ear: External ear normal.  Nose: Nose normal.  Mouth/Throat: Oropharynx is clear and moist.  Eyes: Conjunctivae normal are normal. Pupils are equal, round, and reactive to light. Right eye exhibits no discharge. Left eye exhibits no discharge.  Neck: Normal range of motion. Neck supple. No JVD present. No tracheal deviation present. No thyromegaly present.  Cardiovascular: Normal rate, regular rhythm and normal heart sounds.   Pulmonary/Chest: No stridor. No respiratory distress. She has no wheezes.  Abdominal: Soft. Bowel sounds are normal. She exhibits no distension and no mass. There is no tenderness. There is no rebound and no guarding.   Musculoskeletal: She exhibits tenderness (LS is tender; str leg is +/- on L). She exhibits no edema.  Lymphadenopathy:    She has no cervical adenopathy.  Neurological: She displays normal reflexes. No cranial nerve deficit. She exhibits normal muscle tone. Coordination normal.  Skin: No rash noted. No erythema.  Psychiatric: Her behavior is normal. Judgment and thought content normal.    Wt Readings from Last 3 Encounters:  06/15/12 153 lb (69.4 kg)  03/01/12 153 lb (69.4 kg)  11/09/11 154 lb (69.854 kg)   Lab Results  Component Value Date   WBC 5.2 03/01/2012   HGB 13.0 03/01/2012   HCT 39.1 03/01/2012   PLT 224.0 03/01/2012   CHOL 184 07/01/2010   TRIG 52.0 07/01/2010   HDL 63.80 07/01/2010   ALT 20 03/01/2012   AST 28 03/01/2012   NA 141 03/01/2012   K 5.4* 03/01/2012   CL 105 03/01/2012   CREATININE 0.8 03/01/2012   BUN 16 03/01/2012   CO2 28 03/01/2012   TSH 3.55 03/01/2012   INR 0.90 08/05/2009   HGBA1C 5.7 05/20/2007         Assessment & Plan:

## 2012-06-21 ENCOUNTER — Encounter: Payer: Self-pay | Admitting: Internal Medicine

## 2012-06-21 MED ORDER — ENALAPRIL MALEATE 5 MG PO TABS: 5.0000 mg | ORAL_TABLET | Freq: Two times a day (BID) | ORAL | Status: DC

## 2012-08-17 ENCOUNTER — Ambulatory Visit: Payer: Medicare Other | Admitting: Internal Medicine

## 2012-12-16 ENCOUNTER — Encounter: Payer: Self-pay | Admitting: Internal Medicine

## 2012-12-16 ENCOUNTER — Ambulatory Visit (INDEPENDENT_AMBULATORY_CARE_PROVIDER_SITE_OTHER): Payer: Medicare Other | Admitting: Internal Medicine

## 2012-12-16 ENCOUNTER — Other Ambulatory Visit: Payer: Self-pay | Admitting: Internal Medicine

## 2012-12-16 VITALS — BP 138/70 | HR 77 | Temp 97.5°F | Resp 12 | Ht 66.5 in | Wt 157.0 lb

## 2012-12-16 DIAGNOSIS — R21 Rash and other nonspecific skin eruption: Secondary | ICD-10-CM

## 2012-12-16 DIAGNOSIS — D692 Other nonthrombocytopenic purpura: Secondary | ICD-10-CM

## 2012-12-16 NOTE — Patient Instructions (Signed)

## 2012-12-16 NOTE — Progress Notes (Signed)
Subjective:    Patient ID: Haley Armstrong, female    DOB: Jun 06, 1946, 67 y.o.   MRN: [id number]  HPI  Pt presents to the clinic today with c/o ras on bilateral lower legs. This started on Wednesday. This is something she periodically has on and off during the summer months. A few years ago, Dr. Posey Rea gave her triamcinolone with cetaphil cream. It did not seem to help. The rash does not itch. It is not raised. She has never had this worked up by a Armed forces operational officer. She has put Hydrocortisone cream on it but it has not helped. She does stand on her feet all day long. She has not had sunburn or exposure to plants that she may be allergic to. She occasionally has some associated swelling in the left lower leg after working long shifts, none today.  Review of Systems      Past Medical History  Diagnosis Date  . GERD (gastroesophageal reflux disease)   . Osteoarthritis   . Anemia, iron deficiency   . Hypertension   . Blood donor   . Anxiety   . IBS (irritable bowel syndrome)     Current Outpatient Prescriptions  Medication Sig Dispense Refill  . AMBULATORY NON FORMULARY MEDICATION Take 1 mL by mouth as needed. GI Cocktail--- Equal parts Maalox, Donnatal, and Xylocain  1200 mL  3  . amLODipine (NORVASC) 5 MG tablet Take 2.5 mg by mouth daily.       Marland Kitchen APAP-Isometheptene-Dichloral (EPIDRIN) 325-65-100 MG CAPS Take by mouth 2 (two) times daily as needed.        Marland Kitchen aspirin 81 MG EC tablet Take 81 mg by mouth 2 (two) times daily.        Marland Kitchen b complex vitamins tablet Take 1 tablet by mouth daily.        Marland Kitchen buPROPion (WELLBUTRIN XL) 150 MG 24 hr tablet Take 1 tablet (150 mg total) by mouth daily.  30 tablet  5  . Cholecalciferol 1000 UNITS tablet Take 1,000 Units by mouth daily.        . Diclofenac Sodium (PENNSAID) 1.5 % SOLN Place 3-5 drops onto the skin 3 (three) times daily as needed.        . enalapril (VASOTEC) 5 MG tablet Take 1 tablet (5 mg total) by mouth 2 (two) times daily.  60 tablet   11  . fish oil-omega-3 fatty acids 1000 MG capsule Take 1 g by mouth daily.        . fluocinonide (LIDEX) 0.05 % cream Apply topically as directed.        . hyoscyamine (LEVBID) 0.375 MG 12 hr tablet Take 1 tablet (0.375 mg total) by mouth every 12 (twelve) hours as needed.  60 tablet  11  . LORazepam (ATIVAN) 0.5 MG tablet Take 0.5 mg by mouth 2 (two) times daily as needed. For anxiety       . meloxicam (MOBIC) 15 MG tablet Take 0.5-1 tablets (7.5-15 mg total) by mouth daily.  90 tablet  1  . metaxalone (SKELAXIN) 800 MG tablet Take 1 tablet (800 mg total) by mouth 3 (three) times daily as needed.  90 tablet  2  . Methylcellulose, Laxative, (CITRUCEL PO) Take by mouth. One once daily       . Multiple Vitamins-Minerals (MULTIVITAMIN,TX-MINERALS) tablet Take 1 tablet by mouth daily.        Marland Kitchen oxyCODONE-acetaminophen (PERCOCET) 5-325 MG per tablet Take 1 tablet by mouth every 6 (six) hours as needed.      Marland Kitchen  pantoprazole (PROTONIX) 40 MG tablet Take 1 tablet (40 mg total) by mouth daily.  30 tablet  11  . pregabalin (LYRICA) 150 MG capsule Take 150 mg by mouth 2 (two) times daily.      . Sennosides (SENNA LAX PO) Take by mouth. As needed        No current facility-administered medications for this visit.    No Known Allergies  Family History  Problem Relation Age of Onset  . Lymphoma Mother   . Dementia Mother   . Cancer Mother 34    uterine sarcoma  . Parkinsonism Father   . Heart disease Brother     CAD  . Colon cancer Neg Hx     History   Social History  . Marital Status: Married    Spouse Name: N/A    Number of Children: N/A  . Years of Education: N/A   Occupational History  . 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 Retired    Social History Main Topics  . Smoking status: Former Games developer  . Smokeless tobacco: Never Used  . Alcohol Use: Yes     Comment: once a week   . Drug Use: No  . Sexually Active: Not on file   Other Topics Concern  . Not on  file   Social History Narrative   Taking care of her old mother.           Constitutional: Denies fever, malaise, fatigue, headache or abrupt weight changes.  Musculoskeletal: Denies decrease in range of motion, difficulty with gait, muscle pain or joint pain and swelling.  Skin: Pt reports rash on lower legs. Denies redness, lesions or ulcercations.    No other specific complaints in a complete review of systems (except as listed in HPI above).  Objective:   Physical Exam  BP 138/70  Pulse 77  Temp(Src) 97.5 F (36.4 C) (Oral)  Resp 12  Ht 5' 6.5" (1.689 m)  Wt 157 lb (71.215 kg)  BMI 24.96 kg/m2  SpO2 97% Wt Readings from Last 3 Encounters:  12/16/12 157 lb (71.215 kg)  06/15/12 153 lb (69.4 kg)  03/01/12 153 lb (69.4 kg)    General: Appears her stated age, well developed, well nourished in NAD. Skin: Warm, dry and intact. No lesions or ulcerations noted. Splotchy macular red rash noted on BLE, non blanching.  Cardiovascular: Normal rate and rhythm. S1,S2 noted.  No murmur, rubs or gallops noted. No JVD or BLE edema. No carotid bruits noted. Pulmonary/Chest: Normal effort and positive vesicular breath sounds. No respiratory distress. No wheezes, rales or ronchi noted.  Musculoskeletal: Normal range of motion. No signs of joint swelling. No difficulty with gait.    BMET    Component Value Date/Time   NA 141 03/01/2012 1003   K 5.4* 03/01/2012 1003   CL 105 03/01/2012 1003   CO2 28 03/01/2012 1003   GLUCOSE 94 03/01/2012 1003   BUN 16 03/01/2012 1003   CREATININE 0.8 03/01/2012 1003   CALCIUM 9.2 03/01/2012 1003   GFRNONAA 79.13 07/01/2010 0914   GFRAA  Value: >60        The eGFR has been calculated using the MDRD equation. This calculation has not been validated in all clinical situations. eGFR's persistently <60 mL/min signify possible Chronic Kidney Disease. 08/07/2009 0600    Lipid Panel     Component Value Date/Time   CHOL 184 07/01/2010 0914   TRIG 52.0 07/01/2010 0914  HDL 63.80 07/01/2010 0914   CHOLHDL 3 07/01/2010 0914   VLDL 10.4 07/01/2010 0914   LDLCALC 110* 07/01/2010 0914    CBC    Component Value Date/Time   WBC 5.2 03/01/2012 1003   RBC 4.12 03/01/2012 1003   HGB 13.0 03/01/2012 1003   HCT 39.1 03/01/2012 1003   PLT 224.0 03/01/2012 1003   MCV 94.8 03/01/2012 1003   MCHC 33.2 03/01/2012 1003   RDW 13.5 03/01/2012 1003   LYMPHSABS 1.4 03/01/2012 1003   MONOABS 0.5 03/01/2012 1003   EOSABS 0.1 03/01/2012 1003   BASOSABS 0.0 03/01/2012 1003    Hgb A1C Lab Results  Component Value Date   HGBA1C 5.7 05/20/2007         Assessment & Plan:   Bilateral lower leg macular rash, resembling purpura, recurrent:  Try to wear compression stockings Elevate your leg when you can  RTC as needed or if symptoms worsen

## 2012-12-19 ENCOUNTER — Other Ambulatory Visit: Payer: Self-pay | Admitting: Internal Medicine

## 2013-03-08 ENCOUNTER — Ambulatory Visit (INDEPENDENT_AMBULATORY_CARE_PROVIDER_SITE_OTHER): Payer: Medicare Other | Admitting: Internal Medicine

## 2013-03-08 ENCOUNTER — Encounter: Payer: Self-pay | Admitting: Internal Medicine

## 2013-03-08 ENCOUNTER — Ambulatory Visit (INDEPENDENT_AMBULATORY_CARE_PROVIDER_SITE_OTHER)
Admission: RE | Admit: 2013-03-08 | Discharge: 2013-03-08 | Disposition: A | Discharge status: Home / Self Care | Payer: Medicare Other | Source: Ambulatory Visit | Attending: Internal Medicine | Admitting: Internal Medicine

## 2013-03-08 VITALS — BP 158/72 | HR 72 | Temp 97.7°F | Resp 16 | Ht 65.5 in | Wt 158.0 lb

## 2013-03-08 DIAGNOSIS — M545 Low back pain, unspecified: Secondary | ICD-10-CM

## 2013-03-08 DIAGNOSIS — M76899 Other specified enthesopathies of unspecified lower limb, excluding foot: Secondary | ICD-10-CM

## 2013-03-08 DIAGNOSIS — I10 Essential (primary) hypertension: Secondary | ICD-10-CM

## 2013-03-08 DIAGNOSIS — M79609 Pain in unspecified limb: Secondary | ICD-10-CM

## 2013-03-08 DIAGNOSIS — M79672 Pain in left foot: Secondary | ICD-10-CM

## 2013-03-08 DIAGNOSIS — Z0000 Encounter for general adult medical examination without abnormal findings: Secondary | ICD-10-CM

## 2013-03-08 DIAGNOSIS — R609 Edema, unspecified: Secondary | ICD-10-CM

## 2013-03-08 DIAGNOSIS — R197 Diarrhea, unspecified: Secondary | ICD-10-CM

## 2013-03-08 MED ORDER — DICLOFENAC SODIUM 1.5 % TD SOLN: TRANSDERMAL | Status: DC

## 2013-03-08 NOTE — Progress Notes (Signed)
   Subjective:    HPI  The patient is here for a wellness exam.   The patient had a bad fall in May 2014 and is c/o pain in L hip and L ankle - L ankle is swollen...  The patient needs to address  chronic hypertension that has been well controlled with medicines; to address chronic  hyperlipidemia controlled with medicines as well; and to address type 2 chronic diabetes, controlled with medical treatment and diet.  F/u episodes of severe epig pain (saw Dr Jarold Motto) - sporadic - short (10 min) - ery severe x 6 episodes. The last one on Sun 3 am The patient presents for a follow-up of  chronic hypertension - worse, chronic dyslipidemia, anxiety controlled with medicines    Review of Systems  Constitutional: Negative for chills, activity change, appetite change, fatigue and unexpected weight change.  HENT: Negative for congestion, mouth sores and sinus pressure.   Eyes: Negative for visual disturbance.  Respiratory: Negative for cough and chest tightness.   Gastrointestinal: Negative for nausea and abdominal pain.  Genitourinary: Negative for frequency, difficulty urinating and vaginal pain.  Musculoskeletal: Positive for back pain and gait problem.  Skin: Negative for pallor and rash.  Neurological: Negative for dizziness, tremors, weakness, numbness and headaches.  Psychiatric/Behavioral: Negative for confusion, sleep disturbance and decreased concentration.       Objective:   Physical Exam  Constitutional: She appears well-developed and well-nourished. No distress.  HENT:  Head: Normocephalic.  Right Ear: External ear normal.  Left Ear: External ear normal.  Nose: Nose normal.  Mouth/Throat: Oropharynx is clear and moist.  Eyes: Conjunctivae are normal. Pupils are equal, round, and reactive to light. Right eye exhibits no discharge. Left eye exhibits no discharge.  Neck: Normal range of motion. Neck supple. No JVD present. No tracheal deviation present. No thyromegaly  present.  Cardiovascular: Normal rate, regular rhythm and normal heart sounds.   Pulmonary/Chest: No stridor. No respiratory distress. She has no wheezes.  Abdominal: Soft. Bowel sounds are normal. She exhibits no distension and no mass. There is no tenderness. There is no rebound and no guarding.  Musculoskeletal: She exhibits tenderness (LS is not tender; str leg is +/- on Lm). She exhibits no edema.  L lat hip is tender L ankle and L foot is swollen  Lymphadenopathy:    She has no cervical adenopathy.  Neurological: She displays normal reflexes. No cranial nerve deficit. She exhibits normal muscle tone. Coordination normal.  Skin: No rash noted. No erythema.  Psychiatric: Her behavior is normal. Judgment and thought content normal.  L ankle/foot w/swelling and tender L abd poll longus is tender   Wt Readings from Last 3 Encounters:  03/08/13 158 lb (71.668 kg)  12/16/12 157 lb (71.215 kg)  06/15/12 153 lb (69.4 kg)   Lab Results  Component Value Date   WBC 5.2 03/01/2012   HGB 13.0 03/01/2012   HCT 39.1 03/01/2012   PLT 224.0 03/01/2012   CHOL 184 07/01/2010   TRIG 52.0 07/01/2010   HDL 63.80 07/01/2010   ALT 20 03/01/2012   AST 28 03/01/2012   NA 141 03/01/2012   K 5.4* 03/01/2012   CL 105 03/01/2012   CREATININE 0.8 03/01/2012   BUN 16 03/01/2012   CO2 28 03/01/2012   TSH 3.55 03/01/2012   INR 0.90 08/05/2009   HGBA1C 5.7 05/20/2007         Assessment & Plan:

## 2013-03-08 NOTE — Assessment & Plan Note (Signed)
S/p remote injury in 5/14

## 2013-03-09 ENCOUNTER — Telehealth: Payer: Self-pay | Admitting: Internal Medicine

## 2013-03-09 DIAGNOSIS — Z0000 Encounter for general adult medical examination without abnormal findings: Secondary | ICD-10-CM | POA: Insufficient documentation

## 2013-03-09 DIAGNOSIS — R609 Edema, unspecified: Secondary | ICD-10-CM | POA: Insufficient documentation

## 2013-03-09 NOTE — Assessment & Plan Note (Signed)

## 2013-03-09 NOTE — Assessment & Plan Note (Signed)
Compr socks 

## 2013-03-09 NOTE — Telephone Encounter (Signed)
Labs entered. Pt informed  

## 2013-03-09 NOTE — Telephone Encounter (Signed)
Pt thought she was to come in for fasting labs for her physical that she had yesterday.  Should she have a lab order?

## 2013-03-09 NOTE — Assessment & Plan Note (Signed)
See diet 

## 2013-03-09 NOTE — Telephone Encounter (Signed)
Yes. Sorry. I thought she was to come next week CBC, CMET, TSH, UA, Lipids Thx

## 2013-03-09 NOTE — Assessment & Plan Note (Signed)
Continue with current prescription therapy as reflected on the Med list. Work less

## 2013-03-09 NOTE — Assessment & Plan Note (Signed)
Continue with current prescription therapy as reflected on the Med list.  

## 2013-03-13 ENCOUNTER — Other Ambulatory Visit (INDEPENDENT_AMBULATORY_CARE_PROVIDER_SITE_OTHER): Payer: Medicare Other

## 2013-03-13 DIAGNOSIS — I10 Essential (primary) hypertension: Secondary | ICD-10-CM

## 2013-03-13 DIAGNOSIS — Z0000 Encounter for general adult medical examination without abnormal findings: Secondary | ICD-10-CM

## 2013-03-13 LAB — COMPREHENSIVE METABOLIC PANEL
ALT: 21 U/L (ref 0–35)
CO2: 27 mEq/L (ref 19–32)
Calcium: 8.8 mg/dL (ref 8.4–10.5)
Chloride: 106 mEq/L (ref 96–112)
Creatinine, Ser: 0.9 mg/dL (ref 0.4–1.2)
GFR: 64.05 mL/min (ref >60.00)
Glucose, Bld: 103 mg/dL — ABNORMAL HIGH (ref 70–99)
Total Bilirubin: 0.5 mg/dL (ref 0.3–1.2)
Total Protein: 6.7 g/dL (ref 6.0–8.3)

## 2013-03-13 LAB — CBC WITH DIFFERENTIAL/PLATELET
Basophils Absolute: 0 10*3/uL (ref 0.0–0.1)
Eosinophils Absolute: 0.3 10*3/uL (ref 0.0–0.7)
Lymphocytes Relative: 43.6 % (ref 12.0–46.0)
MCHC: 34 g/dL (ref 30.0–36.0)
MCV: 91.8 fl (ref 78.0–100.0)
Monocytes Absolute: 0.6 10*3/uL (ref 0.1–1.0)
Neutrophils Relative %: 38 % — ABNORMAL LOW (ref 43.0–77.0)
Platelets: 231 10*3/uL (ref 150.0–400.0)
RDW: 14.2 % (ref 11.5–14.6)

## 2013-03-13 LAB — URINALYSIS, ROUTINE W REFLEX MICROSCOPIC
Bilirubin Urine: NEGATIVE
Ketones, ur: NEGATIVE
Leukocytes, UA: NEGATIVE
Nitrite: NEGATIVE
Urobilinogen, UA: 0.2 (ref 0.0–1.0)
pH: 6 (ref 5.0–8.0)

## 2013-03-13 LAB — LIPID PANEL
HDL: 56.3 mg/dL (ref >39.00)
Total CHOL/HDL Ratio: 3
Triglycerides: 76 mg/dL (ref 0.0–149.0)

## 2013-03-22 ENCOUNTER — Telehealth: Payer: Self-pay | Admitting: Internal Medicine

## 2013-03-22 DIAGNOSIS — M545 Low back pain, unspecified: Secondary | ICD-10-CM

## 2013-03-22 NOTE — Telephone Encounter (Signed)
Pt request PT referral to be send to [facility] on [address], therapist name is Calla Kicks, Phone # [phone]. Please advise.

## 2013-03-23 NOTE — Telephone Encounter (Signed)
ok 

## 2013-03-25 ENCOUNTER — Ambulatory Visit (INDEPENDENT_AMBULATORY_CARE_PROVIDER_SITE_OTHER): Payer: Medicare Other | Admitting: Family Medicine

## 2013-03-25 VITALS — BP 120/80 | HR 68 | Temp 97.8°F | Resp 16 | Wt 158.2 lb

## 2013-03-25 DIAGNOSIS — M25549 Pain in joints of unspecified hand: Secondary | ICD-10-CM

## 2013-03-25 DIAGNOSIS — Z23 Encounter for immunization: Secondary | ICD-10-CM

## 2013-03-25 DIAGNOSIS — S61219A Laceration without foreign body of unspecified finger without damage to nail, initial encounter: Secondary | ICD-10-CM

## 2013-03-25 DIAGNOSIS — S61209A Unspecified open wound of unspecified finger without damage to nail, initial encounter: Secondary | ICD-10-CM

## 2013-03-25 NOTE — Progress Notes (Signed)
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 She was opening up a jar using a wrench and the glass top broke and damage to skin.  Patient has no numbness or loss of motion of her finger. Her last tetanus shot was over 10 years ago.  Objective: Wound of 2.5 cm was identified and explored revealing no tendon damage. She had good sensation over the finger and there was hemostasis when she arrived.  Procedure:  Consent obtained - Local anesthesia with 2% lido.  2.5cm wound cleaned and closed with 5-0 Ethilon #3 horizontal mattress sutures.  Drsg placed. Sutures to be removed in about 10d due to the location of the wound over the joint of the finger.  Assessment: Simple laceration to the base of the index finger on the radial side, left hand.  Plan: Suture removal in 10 days.  Patient also complains about chronic left wrist pain with tenderness over the radial styloid.  Objective: There is no swelling there the patient is tender over the radial styloid with full range of motion of her wrist and forearm.  Assessment: De Quervain's synovitis  Plan: Injection of wrist when patient returns for suture removal.  Signed, Aggie Cosier.D.

## 2013-03-25 NOTE — Patient Instructions (Addendum)
WOUND CARE Please return in 10 days to have your stitches/staples removed or sooner if you have concerns. Marland Kitchen Keep area clean and dry for 24 hours. Do not remove bandage, if applied. . After 24 hours, remove bandage and wash wound gently with mild soap and warm water. Reapply a new bandage after cleaning wound, if directed. . Continue daily cleansing with soap and water until stitches/staples are removed. . Do not apply any ointments or creams to the wound while stitches/staples are in place, as this may cause delayed healing. . Notify the office if you experience any of the following signs of infection: Swelling, redness, pus drainage, streaking, fever >101.0 F . Notify the office if you experience excessive bleeding that does not stop after 15-20 minutes of constant, firm pressure.  De Quervain's Disease Suzette Battiest disease is a condition often seen in racquet sports where there is a soreness (inflammation) in the cord like structures (tendons) which attach muscle to bone on the thumb side of the wrist. There may be a tightening of the tissuesaround the tendons. This condition is often helped by giving up or modifying the activity which caused it. When conservative treatment does not help, surgery may be required. Conservative treatment could include changes in the activity which brought about the problem or made it worse. Anti-inflammatory medications and injections may be used to help decrease the inflammation and help with pain control. Your caregiver will help you determine which is best for you. DIAGNOSIS  Often the diagnosis (learning what is wrong) can be made by examination. Sometimes x-rays are required. HOME CARE INSTRUCTIONS   Apply ice to the sore area for 15-20 minutes, 3-4 times per day while awake. Put the ice in a plastic bag and place a towel between the bag of ice and your skin. This is especially helpful if it can be done after all activities involving the sore  wrist.  Temporary splinting may help.  Only take over-the-counter or prescription medicines for pain, discomfort or fever as directed by your caregiver. SEEK MEDICAL CARE IF:   Pain relief is not obtained with medications, or if you have increasing pain and seem to be getting worse rather than better. MAKE SURE YOU:   Understand these instructions.  Will watch your condition.  Will get help right away if you are not doing well or get worse. Document Released: 03/10/2001 Document Revised: 09/07/2011 Document Reviewed: 06/15/2005 [facility] Patient Information 2014 [place], [place].

## 2013-03-28 NOTE — Telephone Encounter (Signed)
Order re faxed to [phone] 03/28/13.

## 2013-04-03 ENCOUNTER — Ambulatory Visit (INDEPENDENT_AMBULATORY_CARE_PROVIDER_SITE_OTHER): Payer: Medicare Other | Admitting: Family Medicine

## 2013-04-03 DIAGNOSIS — M79609 Pain in unspecified limb: Secondary | ICD-10-CM

## 2013-04-03 DIAGNOSIS — M654 Radial styloid tenosynovitis [de Quervain]: Secondary | ICD-10-CM

## 2013-04-03 MED ORDER — METHYLPREDNISOLONE ACETATE 80 MG/ML IJ SUSP
40.0000 mg | Freq: Once | INTRAMUSCULAR | Status: AC
  Administered 2013-04-03: 40 mg via INTRA_ARTICULAR

## 2013-04-03 NOTE — Progress Notes (Signed)
67 year old woman comes in for suture removal and injection of left wrist were she's had de Quervain synovitis the last month or so. She's had some tenderness over the suture line but no swelling or redness.  Objective: No acute distress Inspection of the left index finger reveals good healing and sutures were removed with Dermabond lysed over the remaining suture line. There is no erythema or discharge, the wound is well approximated.  Patient has tenderness over her left radial styloid with pain radiating to the index finger. This area was prepped with Betadine and injected with Depo-Medrol 40 mg and Marcaine 1 cc. She had relief of pain. No complications  Assessment: Recurring synovitis, resolving laceration  Plan: Return when necessary

## 2013-04-04 ENCOUNTER — Other Ambulatory Visit: Payer: Self-pay

## 2013-04-04 MED ORDER — PANTOPRAZOLE SODIUM 40 MG PO TBEC: 40.0000 mg | DELAYED_RELEASE_TABLET | Freq: Every day | ORAL | Status: DC

## 2013-04-11 ENCOUNTER — Other Ambulatory Visit: Payer: Self-pay | Admitting: Internal Medicine

## 2013-04-11 MED ORDER — METHOCARBAMOL 500 MG PO TABS: 500.0000 mg | ORAL_TABLET | Freq: Four times a day (QID) | ORAL | Status: DC | PRN

## 2013-04-13 ENCOUNTER — Telehealth: Payer: Self-pay | Admitting: Internal Medicine

## 2013-04-13 NOTE — Telephone Encounter (Signed)
Patient called wanting to know if she can get an order put in for lab work, stated she think she has a bladder infection  Please advise what can be done

## 2013-04-13 NOTE — Telephone Encounter (Signed)
Called to informed her the labs has been put in

## 2013-04-13 NOTE — Telephone Encounter (Signed)
Ok UA THx

## 2013-04-14 ENCOUNTER — Telehealth: Payer: Self-pay | Admitting: Internal Medicine

## 2013-04-14 NOTE — Telephone Encounter (Signed)
Pt called back stated that she look up the information online already.

## 2013-04-14 NOTE — Telephone Encounter (Signed)
Pt called stated that Dr. Macario Golds gave her some information about where she could get support hose/stocking for cheap in [place]. Pt lost that information and was wondering if Dr. Macario Golds or the assistant have that information. Please advise.

## 2013-04-21 ENCOUNTER — Other Ambulatory Visit (INDEPENDENT_AMBULATORY_CARE_PROVIDER_SITE_OTHER): Payer: Medicare Other

## 2013-04-21 ENCOUNTER — Other Ambulatory Visit: Payer: Self-pay | Admitting: *Deleted

## 2013-04-21 DIAGNOSIS — M545 Low back pain: Secondary | ICD-10-CM

## 2013-04-21 LAB — URINALYSIS, ROUTINE W REFLEX MICROSCOPIC
Leukocytes, UA: NEGATIVE
Nitrite: NEGATIVE
Specific Gravity, Urine: 1.015 (ref 1.000–1.030)
Total Protein, Urine: NEGATIVE
Urobilinogen, UA: 0.2 (ref 0.0–1.0)
pH: 7 (ref 5.0–8.0)

## 2013-04-28 ENCOUNTER — Other Ambulatory Visit: Payer: Medicare Other

## 2013-04-28 ENCOUNTER — Ambulatory Visit (INDEPENDENT_AMBULATORY_CARE_PROVIDER_SITE_OTHER): Payer: Medicare Other | Admitting: Gastroenterology

## 2013-04-28 ENCOUNTER — Encounter: Payer: Self-pay | Admitting: Gastroenterology

## 2013-04-28 VITALS — BP 128/64 | HR 68 | Ht 65.0 in | Wt 156.4 lb

## 2013-04-28 DIAGNOSIS — R079 Chest pain, unspecified: Secondary | ICD-10-CM

## 2013-04-28 DIAGNOSIS — D509 Iron deficiency anemia, unspecified: Secondary | ICD-10-CM

## 2013-04-28 DIAGNOSIS — K589 Irritable bowel syndrome without diarrhea: Secondary | ICD-10-CM

## 2013-04-28 DIAGNOSIS — K219 Gastro-esophageal reflux disease without esophagitis: Secondary | ICD-10-CM

## 2013-04-28 MED ORDER — DEXLANSOPRAZOLE 60 MG PO CPDR: 60.0000 mg | DELAYED_RELEASE_CAPSULE | Freq: Every day | ORAL | Status: DC

## 2013-04-28 MED ORDER — AMBULATORY NON FORMULARY MEDICATION: 1.0000 mL | Status: DC | PRN

## 2013-04-28 NOTE — Progress Notes (Signed)
This is a 67 year old Caucasian female with chronic GERD who also has a element of rather severe esophageal spasm with recurrent anastomotic substernal chest pain without real precipitating or alleviating elements.  She is followed by primary care and does have essential hypertension but no suspected coronary artery disease.  She certainly gives no cardiac symptomatology at this time.  She's on daily Protonix and has worsening regurgitation if she stops this medication.  She denies true dysphagia.  She is status post cholecystectomy.  Patient denies lower GI complaints except for gas and bloating and mild constipation.  She is up-to-date on her endoscopy and last colonoscopy was 2007.  She's had no anorexia or weight loss.  Her episodes of esophageal spasm are relieved quickly with a GI cocktail.  Current Medications, Allergies, Past Medical History, Past Surgical History, Family History and Social History were reviewed in [employer] record.  ROS: All systems were reviewed and are negative unless otherwise stated in the HPI.          Physical Exam: Blood pressure 128/64, pulse 68 and regular and weight 156.  I cannot appreciate stigmata of chronic liver disease.  Chest is clear and she is in a regular rhythm without murmurs gallops or rubs.  Her abdomen shows no organomegaly, masses or tenderness.  Bowel sounds are normal.  There is no evidence of distention.  Mental status is normal    Assessment and Plan: Chronic GERD with associated esophageal spasm probably related to an adequate acid suppression.  I decided to switch her to Dexilant 60 mg a day in place of Protonix, reviewed antireflux maneuvers, and we will check esophageal manometry for better alleviation of her esophageal difficulties.  She has been using Levbid twice a day for abdominal spasms, but this may be making her acid reflux worse.  I'll have her hold her Levbid until her manometry is been completed.  Otherwise  she is to continue her medications as listed and reviewed.  She denies use of sorbitol, fructose, or any history of lactose intolerance.  Recent lab review shows normal CBC a metabolic profile.  I've ordered a celiac serology panel for review.

## 2013-04-28 NOTE — Patient Instructions (Addendum)
Please stop Protonix and Levbid  Your physician has requested that you go to the basement for the following lab work before leaving today: Celiac panel   We have given you samples of the following medication to take: Dexilant 60 mg, please take one capsule by mouth once daily. If this works well for you please call back for a prescription.  Prescription given today for your GI Cocktail  ___________________________________________________________________________________________________________________________________  You have been scheduled for an esophageal manometry at [facility] on 05-01-2013 at 11 am. Please arrive 30 minutes prior to your procedure for registration. You will need to go to outpatient registration (1st floor of the hospital) first. Make certain to bring your insurance cards as well as a complete list of medications.  Please remember the following:  1) Nothing to eat or drink after 12:00 midnight on the night before your test.  2) Hold all diabetic medications/insulin the morning of the test. You may eat and take your medications after the test.  3) For 3 days prior to your test do not take: Dexilant, Prevacid, Nexium, Protonix, Aciphex, Zegerid, Pantoprazole, Prilosec or omeprazole.  4) For 2 days prior to your test, do not take: Reglan, Tagamet, Zantac, Axid or Pepcid.  5) You MAY use an antacid such as Rolaids or Tums up to 12 hours prior to your test.  It will take at least 2 weeks to receive the results of this test from your physician. ------------------------------------------ ABOUT ESOPHAGEAL MANOMETRY Esophageal manometry (muh-NOM-uh-tree) is a test that gauges how well your esophagus works. Your esophagus is the long, muscular tube that connects your throat to your stomach. Esophageal manometry measures the rhythmic muscle contractions (peristalsis) that occur in your esophagus when you swallow. Esophageal manometry also measures the coordination and  force exerted by the muscles of your esophagus.  During esophageal manometry, a thin, flexible tube (catheter) that contains sensors is passed through your nose, down your esophagus and into your stomach. Esophageal manometry can be helpful in diagnosing some mostly uncommon disorders that affect your esophagus.  Why it's done Esophageal manometry is used to evaluate the movement (motility) of food through the esophagus and into the stomach. The test measures how well the circular bands of muscle (sphincters) at the top and bottom of your esophagus open and close, as well as the pressure, strength and pattern of the wave of esophageal muscle contractions that moves food along.  What you can expect Esophageal manometry is an outpatient procedure done without sedation. Most people tolerate it well. You may be asked to change into a hospital gown before the test starts.  During esophageal manometry  While you are sitting up, a member of your health care team sprays your throat with a numbing medication or puts numbing gel in your nose or both.  A catheter is guided through your nose into your esophagus. The catheter may be sheathed in a water-filled sleeve. It doesn't interfere with your breathing. However, your eyes may water, and you may gag. You may have a slight nosebleed from irritation.  After the catheter is in place, you may be asked to lie on your back on an exam table, or you may be asked to remain seated.  You then swallow small sips of water. As you do, a computer connected to the catheter records the pressure, strength and pattern of your esophageal muscle contractions.  During the test, you'll be asked to breathe slowly and smoothly, remain as still as possible, and swallow only when  you're asked to do so.  A member of your health care team may move the catheter down into your stomach while the catheter continues its measurements.  The catheter then is slowly withdrawn. The test usually  lasts 20 to 30 minutes.  After esophageal manometry  When your esophageal manometry is complete, you may return to your normal activities  This test typically takes 30-45 minutes to complete. ________________________________________________________________________________

## 2013-05-01 LAB — CELIAC PANEL 10
Endomysial Screen: NEGATIVE
Gliadin IgG: 4.8 U/mL (ref <20)
IgA: 221 mg/dL (ref 69–380)
Tissue Transglutaminase Ab, IgA: 3.8 U/mL (ref <20)

## 2013-05-02 ENCOUNTER — Other Ambulatory Visit: Payer: Self-pay | Admitting: Internal Medicine

## 2013-05-11 ENCOUNTER — Other Ambulatory Visit: Payer: Self-pay | Admitting: Internal Medicine

## 2013-05-11 NOTE — Telephone Encounter (Signed)
Ok Thx 

## 2013-05-11 NOTE — Addendum Note (Signed)
Addended by: Tresa Garter on: 05/11/2013 09:51 PM   Modules accepted: Orders

## 2013-05-11 NOTE — Telephone Encounter (Signed)
Patient called requesting a prescription for pregabalin (LYRICA) 150 MG capsule And also would like to be referred to another spine specialist because hers retired Please advise if this can be done, and contact patient at [phone]

## 2013-05-12 ENCOUNTER — Telehealth: Payer: Self-pay

## 2013-05-12 ENCOUNTER — Other Ambulatory Visit: Payer: Self-pay | Admitting: *Deleted

## 2013-05-12 MED ORDER — PREGABALIN 150 MG PO CAPS: 150.0000 mg | ORAL_CAPSULE | Freq: Two times a day (BID) | ORAL | Status: DC

## 2013-05-12 NOTE — Telephone Encounter (Signed)
A user error has taken place.

## 2013-05-12 NOTE — Telephone Encounter (Signed)
Rx phones in, pt notified

## 2013-05-12 NOTE — Telephone Encounter (Signed)
lyrica was renewed When does she want to see a back specialist? Thx

## 2013-05-12 NOTE — Telephone Encounter (Signed)
She can see Dr Evonnie Pat for LBP Thx

## 2013-05-12 NOTE — Telephone Encounter (Signed)
Phone call from patient [phone] stating she left a message yesterday regarding needing a prescription for Lyrica and a referral to a bone specialists since Dr Glendell Docker retired.

## 2013-05-12 NOTE — Telephone Encounter (Signed)
I do not see where Lyrica was renewed?? Please provide qty and if any refills. She states she takes bid.   She would like to know who you would recommend she see and she states she will call for an appt. Please advise. Thanks

## 2013-05-15 ENCOUNTER — Ambulatory Visit (HOSPITAL_COMMUNITY): Admission: RE | Admit: 2013-05-15 | Payer: Medicare Other | Source: Ambulatory Visit | Admitting: Gastroenterology

## 2013-05-15 ENCOUNTER — Encounter (HOSPITAL_COMMUNITY): Admission: RE | Payer: Self-pay | Source: Ambulatory Visit

## 2013-05-15 SURGERY — MANOMETRY, ESOPHAGUS
Anesthesia: Topical

## 2013-05-15 NOTE — Telephone Encounter (Signed)
Patient notified MD message

## 2013-05-18 ENCOUNTER — Telehealth: Payer: Self-pay | Admitting: *Deleted

## 2013-05-18 ENCOUNTER — Telehealth: Payer: Self-pay | Admitting: Gastroenterology

## 2013-05-18 NOTE — Telephone Encounter (Signed)
Letter from pharmacy patient wants to go back to Protonix Patient was taking Protonix once daily but patient is requesting to take Protonix twice daily Is this ok to send?

## 2013-05-18 NOTE — Telephone Encounter (Signed)
lmom for pt to call back

## 2013-05-19 MED ORDER — PANTOPRAZOLE SODIUM 40 MG PO TBEC: 40.0000 mg | DELAYED_RELEASE_TABLET | Freq: Two times a day (BID) | ORAL | Status: DC

## 2013-05-19 NOTE — Telephone Encounter (Signed)
bs 103 on 9/15

## 2013-05-19 NOTE — Telephone Encounter (Signed)
Changed pt's PPI and left her a message to call me if she needs anything else.

## 2013-05-19 NOTE — Telephone Encounter (Signed)
Pt sent a letter stating the Dexilant is too expensive and she would like to switch to Protonix twice daily; she was previously on once daily protonix. OK to switch? Thanks.

## 2013-05-19 NOTE — Telephone Encounter (Signed)
ok 

## 2013-05-31 ENCOUNTER — Telehealth: Payer: Self-pay | Admitting: Gastroenterology

## 2013-05-31 MED ORDER — HYOSCYAMINE SULFATE ER 0.375 MG PO TB12: 0.3750 mg | ORAL_TABLET | Freq: Two times a day (BID) | ORAL | Status: DC | PRN

## 2013-05-31 NOTE — Telephone Encounter (Signed)
Patient was told in last office visit to hold Levbid until esophageal manometry, patient cancelled manometry. Dr. Jarold Armstrong also stated in office note to hold Levbid could be making reflux worse.  Last office note is below: Haley Armstrong has been using Levbid twice a day for abdominal spasms, but this may be making her acid reflux worse. I'll have her hold her Levbid until her manometry is been completed. Otherwise Haley Armstrong is to continue her medications as listed and reviewed. Haley Armstrong denies use of sorbitol, fructose, or any history of lactose intolerance. Recent lab review shows normal CBC a metabolic profile. I've ordered a celiac serology panel for review.  Patient is requesting refill on Levbid, do we refill?

## 2013-05-31 NOTE — Telephone Encounter (Signed)
Pt states she cancelled the Manometry because of the Co Pay and everything. She was going to try Dexilant, but the co pay is $45. The EM would cost her $100 up front and with Christmas coming up, she can't afford it now. She states she will reconsider next year. She states she spoke with Dr Jarold Motto and they agreed she could take Protonix BID. She states she cannot make it w/o Levbid at least once daily because of her IBS. Reordered her Levbid for 1 month. Any other orders or suggestions? Thanks.

## 2013-06-01 NOTE — Telephone Encounter (Signed)
No other recc

## 2013-06-01 NOTE — Telephone Encounter (Signed)
Left message in identified VM that Dr Jarold Motto has no other recommendations for her. She may continue the Levbid and if she changes her mind, she can call to schedule the manometry.

## 2013-06-07 ENCOUNTER — Ambulatory Visit (INDEPENDENT_AMBULATORY_CARE_PROVIDER_SITE_OTHER): Payer: Medicare Other | Admitting: Internal Medicine

## 2013-06-07 ENCOUNTER — Encounter: Payer: Self-pay | Admitting: Internal Medicine

## 2013-06-07 VITALS — BP 142/86 | HR 80 | Temp 97.0°F | Resp 16 | Wt 158.0 lb

## 2013-06-07 DIAGNOSIS — F411 Generalized anxiety disorder: Secondary | ICD-10-CM

## 2013-06-07 DIAGNOSIS — M545 Low back pain: Secondary | ICD-10-CM

## 2013-06-07 DIAGNOSIS — Z23 Encounter for immunization: Secondary | ICD-10-CM

## 2013-06-07 DIAGNOSIS — I10 Essential (primary) hypertension: Secondary | ICD-10-CM

## 2013-06-07 DIAGNOSIS — R1013 Epigastric pain: Secondary | ICD-10-CM

## 2013-06-07 NOTE — Assessment & Plan Note (Signed)
Labs ok  Gluten free trial (no wheat products) for 4-6 weeks. OK to use gluten-free bread and gluten-free pasta.  Milk free trial (no milk, ice cream, cheese and yogurt) for 4-6 weeks. OK to use almond, coconut, rice or soy milk. "Almond breeze" brand tastes good.

## 2013-06-07 NOTE — Patient Instructions (Signed)
Gluten free trial (no wheat products) for 4-6 weeks. OK to use gluten-free bread and gluten-free pasta.  Milk free trial (no milk, ice cream, cheese and yogurt) for 4-6 weeks. OK to use almond, coconut, rice or soy milk. "Almond breeze" brand tastes good.  

## 2013-06-07 NOTE — Assessment & Plan Note (Signed)
She will see Dr Venetia Maxon Loose wt on wt watchers

## 2013-06-07 NOTE — Assessment & Plan Note (Signed)
Continue with current prescription therapy as reflected on the Med list.  BP Readings from Last 3 Encounters:  06/07/13 142/86  04/28/13 128/64  03/25/13 120/80

## 2013-06-07 NOTE — Assessment & Plan Note (Signed)
Continue with current prescription therapy as reflected on the Med list.  

## 2013-06-07 NOTE — Progress Notes (Signed)
Pre visit review using our clinic review tool, if applicable. No additional management support is needed unless otherwise documented below in the visit note. 

## 2013-06-07 NOTE — Progress Notes (Signed)
   Subjective:    HPI    The patient needs to address  chronic hypertension that has been well controlled with medicines; to address chronic  hyperlipidemia controlled with medicines as well F/u episodes of severe epig pain (saw Dr Jarold Motto) - sporadic - short (10 min) - ery severe x 6 episodes. The last one on Sun 3 am The patient presents for a follow-up of  chronic hypertension - worse, chronic dyslipidemia, anxiety controlled with medicines    Review of Systems  Constitutional: Negative for chills, activity change, appetite change, fatigue and unexpected weight change.  HENT: Negative for congestion, mouth sores and sinus pressure.   Eyes: Negative for visual disturbance.  Respiratory: Negative for cough and chest tightness.   Gastrointestinal: Negative for nausea and abdominal pain.  Genitourinary: Negative for frequency, difficulty urinating and vaginal pain.  Musculoskeletal: Positive for back pain and gait problem.  Skin: Negative for pallor and rash.  Neurological: Negative for dizziness, tremors, weakness, numbness and headaches.  Psychiatric/Behavioral: Negative for confusion, sleep disturbance and decreased concentration.       Objective:   Physical Exam  Constitutional: She appears well-developed and well-nourished. No distress.  HENT:  Head: Normocephalic.  Right Ear: External ear normal.  Left Ear: External ear normal.  Nose: Nose normal.  Mouth/Throat: Oropharynx is clear and moist.  Eyes: Conjunctivae are normal. Pupils are equal, round, and reactive to light. Right eye exhibits no discharge. Left eye exhibits no discharge.  Neck: Normal range of motion. Neck supple. No JVD present. No tracheal deviation present. No thyromegaly present.  Cardiovascular: Normal rate, regular rhythm and normal heart sounds.   Pulmonary/Chest: No stridor. No respiratory distress. She has no wheezes.  Abdominal: Soft. Bowel sounds are normal. She exhibits no distension and no  mass. There is no tenderness. There is no rebound and no guarding.  Musculoskeletal: She exhibits tenderness (LS is not tender; str leg is +/- on Lm). She exhibits no edema.  L lat hip is tender L ankle and L foot is swollen  Lymphadenopathy:    She has no cervical adenopathy.  Neurological: She displays normal reflexes. No cranial nerve deficit. She exhibits normal muscle tone. Coordination normal.  Skin: No rash noted. No erythema.  Psychiatric: Her behavior is normal. Judgment and thought content normal.     Wt Readings from Last 3 Encounters:  06/07/13 158 lb (71.668 kg)  04/28/13 156 lb 6 oz (70.931 kg)  03/25/13 158 lb 3.2 oz (71.759 kg)   Lab Results  Component Value Date   WBC 4.9 03/13/2013   HGB 13.0 03/13/2013   HCT 38.3 03/13/2013   PLT 231.0 03/13/2013   CHOL 183 03/13/2013   TRIG 76.0 03/13/2013   HDL 56.30 03/13/2013   ALT 21 03/13/2013   AST 27 03/13/2013   NA 139 03/13/2013   K 4.7 03/13/2013   CL 106 03/13/2013   CREATININE 0.9 03/13/2013   BUN 17 03/13/2013   CO2 27 03/13/2013   TSH 4.47 03/13/2013   INR 0.90 08/05/2009   HGBA1C 5.7 05/20/2007         Assessment & Plan:

## 2013-07-07 ENCOUNTER — Other Ambulatory Visit: Payer: Self-pay | Admitting: Internal Medicine

## 2013-07-11 ENCOUNTER — Telehealth: Payer: Self-pay | Admitting: Internal Medicine

## 2013-07-11 NOTE — Telephone Encounter (Signed)
Rec'd from [facility] forward 4 pages to Dr.[name] °

## 2013-08-04 ENCOUNTER — Telehealth: Payer: Self-pay | Admitting: *Deleted

## 2013-08-04 NOTE — Telephone Encounter (Signed)
Assessment and Plan: Chronic GERD with associated esophageal spasm probably related to an adequate acid suppression. I decided to switch her to Dexilant 60 mg a day in place of Protonix, reviewed antireflux maneuvers, and we will check esophageal manometry for better alleviation of her esophageal difficulties. She has been using Levbid twice a day for abdominal spasms, but this may be making her acid reflux worse. I'll have her hold her Levbid until her manometry is been completed. Otherwise she is to continue her medications as listed and reviewed. She denies use of sorbitol, fructose, or any history of lactose intolerance. Recent lab review shows normal CBC a metabolic profile. I've ordered a celiac serology panel for review.    Received a refill request for Levbid. Per note above on 04/28/13, she was to stop Levbid until after she had an Esophageal Manometry, which she has not done. Pt states when she increased Pantoprazole to BID she improved. OK to refill Levbid? Thanks.

## 2013-08-04 NOTE — Telephone Encounter (Signed)
Faxed refill to [facility] [phone]

## 2013-08-04 NOTE — Telephone Encounter (Signed)
ok 

## 2013-09-06 ENCOUNTER — Ambulatory Visit: Payer: Medicare Other | Admitting: Internal Medicine

## 2013-09-21 ENCOUNTER — Encounter: Payer: Self-pay | Admitting: Internal Medicine

## 2013-09-21 ENCOUNTER — Ambulatory Visit (INDEPENDENT_AMBULATORY_CARE_PROVIDER_SITE_OTHER): Payer: Medicare Other | Admitting: Internal Medicine

## 2013-09-21 VITALS — BP 134/74 | HR 80 | Ht 65.0 in | Wt 163.2 lb

## 2013-09-21 DIAGNOSIS — K5900 Constipation, unspecified: Secondary | ICD-10-CM

## 2013-09-21 DIAGNOSIS — K219 Gastro-esophageal reflux disease without esophagitis: Secondary | ICD-10-CM

## 2013-09-21 DIAGNOSIS — R079 Chest pain, unspecified: Secondary | ICD-10-CM

## 2013-09-21 MED ORDER — HYOSCYAMINE SULFATE 0.125 MG SL SUBL: SUBLINGUAL_TABLET | SUBLINGUAL | Status: DC

## 2013-09-21 NOTE — Patient Instructions (Signed)
You have been scheduled for an esophageal manometry at [facility] on 10/09/2013 at 8:00am. Please arrive 30 minutes prior to your procedure for registration. You will need to go to outpatient registration (1st floor of the hospital) first. Make certain to bring your insurance cards as well as a complete list of medications.  Please remember the following:  1) Nothing to eat or drink after 12:00 midnight on the night before your test.  2) Hold all diabetic medications/insulin the morning of the test. You may eat and take your medications after the test.  3) For 3 days prior to your test do not take: Dexilant, Prevacid, Nexium, Protonix,  Aciphex, Zegerid, Pantoprazole, Prilosec or omeprazole.  4) For 2 days prior to your test, do not take: Reglan, Tagamet, Zantac, Axid or Pepcid.  5) You MAY use an antacid such as Rolaids or Tums up to 12 hours prior to your test.  We have sent the following medications to your pharmacy for you to pick up at your convenience: Levsin  It will take at least 2 weeks to receive the results of this test from your physician. ------------------------------------------ ABOUT ESOPHAGEAL MANOMETRY Esophageal manometry (muh-NOM-uh-tree) is a test that gauges how well your esophagus works. Your esophagus is the long, muscular tube that connects your throat to your stomach. Esophageal manometry measures the rhythmic muscle contractions (peristalsis) that occur in your esophagus when you swallow. Esophageal manometry also measures the coordination and force exerted by the muscles of your esophagus.  During esophageal manometry, a thin, flexible tube (catheter) that contains sensors is passed through your nose, down your esophagus and into your stomach. Esophageal manometry can be helpful in diagnosing some mostly uncommon disorders that affect your esophagus.  Why it's done Esophageal manometry is used to evaluate the movement (motility) of food through the  esophagus and into the stomach. The test measures how well the circular bands of muscle (sphincters) at the top and bottom of your esophagus open and close, as well as the pressure, strength and pattern of the wave of esophageal muscle contractions that moves food along.  What you can expect Esophageal manometry is an outpatient procedure done without sedation. Most people tolerate it well. You may be asked to change into a hospital gown before the test starts.  During esophageal manometry  While you are sitting up, a member of your health care team sprays your throat with a numbing medication or puts numbing gel in your nose or both.  A catheter is guided through your nose into your esophagus. The catheter may be sheathed in a water-filled sleeve. It doesn't interfere with your breathing. However, your eyes may water, and you may gag. You may have a slight nosebleed from irritation.  After the catheter is in place, you may be asked to lie on your back on an exam table, or you may be asked to remain seated.  You then swallow small sips of water. As you do, a computer connected to the catheter records the pressure, strength and pattern of your esophageal muscle contractions.  During the test, you'll be asked to breathe slowly and smoothly, remain as still as possible, and swallow only when you're asked to do so.  A member of your health care team may move the catheter down into your stomach while the catheter continues its measurements.  The catheter then is slowly withdrawn. The test usually lasts 20 to 30 minutes.  After esophageal manometry  When your esophageal manometry is complete, you  may return to your normal activities  This test typically takes 30-45 minutes to complete. ________________________________________________________________________________

## 2013-09-21 NOTE — Progress Notes (Signed)
HISTORY OF PRESENT ILLNESS:  Haley Armstrong is a 68 y.o. female with the below listed medical history who presents today, self-referred, regarding chronic chest pain. She inquired about having an esophageal manometry. Previous patient of Dr. Verl Blalock. Last seen by Dr. Sharlett Iles 04/28/2013. Reviewed. Impression was chronic GERD with esophageal spasm. PPI changed. Esophageal manometry suggested. Patient elected to hold off due to cost. She is continued on Dexilant. Problems with chest pain continue. She reports symptoms greater than 5 years. Difficult historian but describes a deep pain (not sharp or heavy) radiating from the center of her chest. Generally awakes her at night. Will last 15 minutes without intervention. Will improve quickly with GI cocktail. Last upper endoscopy 2010 unremarkable. Previous ultrasound unrevealing. Gallbladder surgically absent. Last colonoscopy 2007. Unremarkable. She denies classic reflux symptoms. Weight has increased. This bothers her. Constipation continues. No regular regimen. Takes laxatives when necessary  REVIEW OF SYSTEMS:  All non-GI ROS negative except for vision change, fatigue, occasional hoarseness  Past Medical History  Diagnosis Date  . GERD (gastroesophageal reflux disease)   . Osteoarthritis   . Anemia, iron deficiency   . Hypertension   . Blood donor   . Anxiety   . IBS (irritable bowel syndrome)     Past Surgical History  Procedure Laterality Date  . Appendectomy    . Cholecystectomy    . Cervical fusion  2011    C4-5 Dr Marcial Pacas    Social History [name]  reports that she has quit smoking. She has never used smokeless tobacco. She reports that she drinks alcohol. She reports that she does not use illicit drugs.  family history includes Cancer (age of onset: 17) in her mother; Dementia in her mother; Heart disease in her brother; Lymphoma in her mother; Parkinsonism in her father. There is no history of Colon cancer.  No  Known Allergies     PHYSICAL EXAMINATION:  Vital signs: BP 134/74  Pulse 80  Ht 5\' 5"  (1.651 m)  Wt 163 lb 4 oz (74.05 kg)  BMI 27.17 kg/m2 General: Well-developed, well-nourished, no acute distress HEENT: Sclerae are anicteric, conjunctiva pink. Oral mucosa intact Lungs: Clear Heart: Regular Abdomen: soft, nontender, nondistended, no obvious ascites, no peritoneal signs, normal bowel sounds. No organomegaly. Extremities: No edema Psychiatric: alert and oriented x3. Cooperative but anxious and pensive   ASSESSMENT:  #1. Chronic chest pain. Etiology unclear. Possible esophageal spasm #2. Chronic GERD with unremarkable endoscopy 2010 #3. Chronic constipation #4. Normal colonoscopy 2007  PLAN:  #1. We discussed possibly repeating upper endoscopy. She declined #2. Esophageal manometry to rule out motility disorder as a cause for chronic chest pain. #3. Continue PPI and reflux precautions #4. MiraLax for bowels #5. Routine screening colonoscopy due around 2017 #6. Prescribed sublingual Levsin to see if this helps with chest pain episodes #7. Continue GI cocktail if this helps

## 2013-09-26 ENCOUNTER — Encounter: Payer: Self-pay | Admitting: Internal Medicine

## 2013-10-04 ENCOUNTER — Encounter: Payer: Medicare Other | Admitting: Internal Medicine

## 2013-10-11 ENCOUNTER — Telehealth: Payer: Self-pay | Admitting: *Deleted

## 2013-10-11 DIAGNOSIS — M545 Low back pain, unspecified: Secondary | ICD-10-CM

## 2013-10-11 NOTE — Telephone Encounter (Signed)
Pt is requesting referral to Haley Armstrong at [facility] for her neck muscles, right shoulder and under right shoulder blade. Please advise

## 2013-10-11 NOTE — Telephone Encounter (Signed)
Ok Thx 

## 2013-10-27 LAB — HM MAMMOGRAPHY

## 2013-11-13 ENCOUNTER — Ambulatory Visit (HOSPITAL_COMMUNITY)
Admission: RE | Admit: 2013-11-13 | Discharge: 2013-11-13 | Disposition: A | Discharge status: Home / Self Care | Payer: Medicare Other | Source: Ambulatory Visit | Attending: Internal Medicine | Admitting: Internal Medicine

## 2013-11-13 ENCOUNTER — Encounter (HOSPITAL_COMMUNITY)
Admission: RE | Disposition: A | Discharge status: Home / Self Care | Payer: Self-pay | Source: Ambulatory Visit | Attending: Internal Medicine

## 2013-11-13 DIAGNOSIS — R079 Chest pain, unspecified: Secondary | ICD-10-CM

## 2013-11-13 DIAGNOSIS — K219 Gastro-esophageal reflux disease without esophagitis: Secondary | ICD-10-CM

## 2013-11-13 HISTORY — PX: ESOPHAGEAL MANOMETRY: SHX5429

## 2013-11-13 SURGERY — MANOMETRY, ESOPHAGUS

## 2013-11-13 MED ORDER — LIDOCAINE VISCOUS 2 % MT SOLN
OROMUCOSAL | Status: AC
  Filled 2013-11-13: qty 15

## 2013-11-13 SURGICAL SUPPLY — 1 items: FACESHIELD LNG OPTICON STERILE (SAFETY) IMPLANT

## 2013-11-14 ENCOUNTER — Encounter (HOSPITAL_COMMUNITY): Payer: Self-pay | Admitting: Internal Medicine

## 2013-11-15 ENCOUNTER — Other Ambulatory Visit: Payer: Self-pay | Admitting: *Deleted

## 2013-11-16 ENCOUNTER — Telehealth: Payer: Self-pay | Admitting: Internal Medicine

## 2013-11-16 NOTE — Telephone Encounter (Signed)
Patient is calling for a refill on her pregabalin (LYRICA) 150 MG capsule rx. Please advise.

## 2013-11-17 MED ORDER — PREGABALIN 150 MG PO CAPS: 150.0000 mg | ORAL_CAPSULE | Freq: Two times a day (BID) | ORAL | Status: DC

## 2013-11-17 NOTE — Telephone Encounter (Signed)
OK to fill this prescription with additional refills x3 Thank you!  

## 2013-11-17 NOTE — Telephone Encounter (Signed)
Done. Left detailed mess informing pt.  

## 2013-11-28 ENCOUNTER — Telehealth: Payer: Self-pay | Admitting: Internal Medicine

## 2013-11-28 NOTE — Telephone Encounter (Signed)
Pt calling for esophageal mano results. Result is scanned under media dated 5/18-admission cardiovascular. Please advise.

## 2013-11-28 NOTE — Telephone Encounter (Signed)
Pt aware and scheduled for OV with Dr. Henrene Pastor 01/17/14@9 :15am.

## 2013-11-28 NOTE — Telephone Encounter (Signed)
Yes. Results back, and she does have esophageal spasm. Have her come in to discuss results and possible treatment options. Next available appointment would be fine (she has had these problems for many years)

## 2013-12-05 ENCOUNTER — Telehealth: Payer: Self-pay

## 2013-12-05 MED ORDER — HYOSCYAMINE SULFATE ER 0.375 MG PO TB12: 0.3750 mg | ORAL_TABLET | Freq: Two times a day (BID) | ORAL | Status: DC | PRN

## 2013-12-05 NOTE — Telephone Encounter (Signed)
Call pharmacy to see if there's such a thing

## 2013-12-05 NOTE — Telephone Encounter (Signed)
Patient last seen in office 09/21/13.  Has appointment scheduled for 01/17/14.  Has had GI cocktail in the past but states that she cannot afford it b/c the [employer] makes it $800.  She is requesting an rx for Mylanta and Lidocaine only so she can at least get a numbing effect.  Please advise.

## 2013-12-06 ENCOUNTER — Telehealth: Payer: Self-pay

## 2013-12-06 DIAGNOSIS — M4310 Spondylolisthesis, site unspecified: Secondary | ICD-10-CM | POA: Insufficient documentation

## 2013-12-06 NOTE — Telephone Encounter (Signed)
Pt's pharmacy confirmed that they can compound Mylanta and Lidocaine.  Ok to go ahead and have them do so?

## 2013-12-07 ENCOUNTER — Telehealth: Payer: Self-pay

## 2013-12-07 MED ORDER — AMBULATORY NON FORMULARY MEDICATION: Status: DC

## 2013-12-07 NOTE — Telephone Encounter (Signed)
Called in rx for Mylanta and Lidocaine per Dr. Henrene Pastor

## 2013-12-07 NOTE — Telephone Encounter (Signed)
Called in rx for Mylanta and Lidocaine to pharmacy after getting approval from Dr. Henrene Pastor

## 2013-12-07 NOTE — Telephone Encounter (Signed)
Message copied by Audrea Muscat on Thu Dec 07, 2013  4:49 PM ------      Message from: Irene Shipper      Created: Thu Dec 07, 2013  4:04 PM       Yes. She can take 30-60 cc by mouth twice a day when necessary      ----- Message -----         From: Audrea Muscat, CMA         Sent: 12/07/2013   3:31 PM           To: Irene Shipper, MD            Pt's pharmacy confirmed that they can compound Mylanta and Lidocaine.  Ok to go ahead and have them do so?                      ------

## 2013-12-07 NOTE — Telephone Encounter (Signed)
Called in rx for Mylanta and Lidocaine

## 2014-01-12 ENCOUNTER — Other Ambulatory Visit: Payer: Self-pay | Admitting: Internal Medicine

## 2014-01-17 ENCOUNTER — Encounter: Payer: Self-pay | Admitting: Internal Medicine

## 2014-01-17 ENCOUNTER — Ambulatory Visit (INDEPENDENT_AMBULATORY_CARE_PROVIDER_SITE_OTHER): Payer: Medicare Other | Admitting: Internal Medicine

## 2014-01-17 VITALS — BP 110/64 | HR 68 | Ht 66.5 in | Wt 159.0 lb

## 2014-01-17 DIAGNOSIS — K5900 Constipation, unspecified: Secondary | ICD-10-CM

## 2014-01-17 DIAGNOSIS — R0789 Other chest pain: Secondary | ICD-10-CM

## 2014-01-17 DIAGNOSIS — K219 Gastro-esophageal reflux disease without esophagitis: Secondary | ICD-10-CM

## 2014-01-17 DIAGNOSIS — K224 Dyskinesia of esophagus: Secondary | ICD-10-CM

## 2014-01-17 NOTE — Patient Instructions (Signed)
Please follow up with Dr. Perry as needed 

## 2014-01-17 NOTE — Progress Notes (Signed)
HISTORY OF PRESENT ILLNESS:  Haley Armstrong is a 68 y.o. female with the below listed medical history who was evaluated 09/21/2013 for chronic chest pain. See that dictation for details. Previous patient of Dr. Verl Blalock. The clinical impression at that time was chronic chest pain possibly related to esophageal spasm, chronic GERD with unremarkable endoscopy 2010, chronic ongoing constipation with normal colonoscopy in 2007. Recommendations as outlined. She subsequently underwent high-resolution esophageal manometry 11/13/2013. This revealed changes consistent with "nutcracker esophagus". She presents today for followup. Patient did try sublingual Levsin for her chest pain. This did not help. She tells me that she discontinued a dietary product and feels that this has helped her chest pain. She denies having had any episodes of chest pain in the past few weeks. She does use a combination of Mylanta and lidocaine when needed. She states that this does help. She is here today to review the results of manometry and discuss possible treatment options.  REVIEW OF SYSTEMS:  All non-GI ROS negative except for arthritis, myalgias  Past Medical History  Diagnosis Date  . GERD (gastroesophageal reflux disease)   . Osteoarthritis   . Anemia, iron deficiency   . Hypertension   . Blood donor   . Anxiety   . IBS (irritable bowel syndrome)     Past Surgical History  Procedure Laterality Date  . Appendectomy    . Cholecystectomy    . Cervical fusion  2011    C4-5 Dr Marcial Pacas  . Esophageal manometry N/A 11/13/2013    Procedure: ESOPHAGEAL MANOMETRY (EM);  Surgeon: Irene Shipper, MD;  Location: [facility];  Service: Endoscopy;  Laterality: N/A;    Social History Haley Armstrong  reports that she has quit smoking. She has never used smokeless tobacco. She reports that she drinks alcohol. She reports that she does not use illicit drugs.  family history includes Cancer (age of onset: 48) in her  mother; Dementia in her mother; Heart disease in her brother; Lymphoma in her mother; Parkinsonism in her father. There is no history of Colon cancer.  No Known Allergies     PHYSICAL EXAMINATION: Vital signs: BP 110/64  Pulse 68  Ht 5' 6.5" (1.689 m)  Wt 159 lb (72.122 kg)  BMI 25.28 kg/m2 General: Well-developed, well-nourished, no acute distress Abdomen: Not reexamined. Psychiatric: alert and oriented x3. 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 GERD with unremarkable upper endoscopy 2010 #3. Chronic constipation with normal colonoscopy 2007   PLAN:  #1. We discussed the data behind possible treatment options for nutcracker esophagus. We discussed tricyclic antidepressants and calcium channel blockers. We discussed side effects in addition to the data. To this end, she was not interested in these other treatment options. I think this is reasonable given  their fair efficacy and potential side effects. She will continue with Mylanta lidocaine suspension on an as-needed basis #2. Continue PPI and reflux precautions #3. MiraLax for constipation #4. Repeat screening colonoscopy 2017. Return to the care of your PCP. Interval GI followup as needed

## 2014-01-25 ENCOUNTER — Encounter: Payer: Self-pay | Admitting: Internal Medicine

## 2014-02-12 ENCOUNTER — Other Ambulatory Visit: Payer: Self-pay | Admitting: Internal Medicine

## 2014-02-14 ENCOUNTER — Telehealth: Payer: Self-pay

## 2014-02-14 NOTE — Telephone Encounter (Signed)
LVM for pt to call back and schedule AWV.

## 2014-03-21 ENCOUNTER — Encounter: Payer: Self-pay | Admitting: Internal Medicine

## 2014-03-21 ENCOUNTER — Ambulatory Visit (INDEPENDENT_AMBULATORY_CARE_PROVIDER_SITE_OTHER): Payer: Medicare Other | Admitting: Internal Medicine

## 2014-03-21 VITALS — BP 140/70 | HR 76 | Temp 98.2°F | Resp 16 | Ht 66.0 in | Wt 160.0 lb

## 2014-03-21 DIAGNOSIS — F411 Generalized anxiety disorder: Secondary | ICD-10-CM

## 2014-03-21 DIAGNOSIS — R413 Other amnesia: Secondary | ICD-10-CM

## 2014-03-21 DIAGNOSIS — F988 Other specified behavioral and emotional disorders with onset usually occurring in childhood and adolescence: Secondary | ICD-10-CM

## 2014-03-21 DIAGNOSIS — Z23 Encounter for immunization: Secondary | ICD-10-CM

## 2014-03-21 DIAGNOSIS — Z0000 Encounter for general adult medical examination without abnormal findings: Secondary | ICD-10-CM

## 2014-03-21 DIAGNOSIS — M542 Cervicalgia: Secondary | ICD-10-CM

## 2014-03-21 MED ORDER — LORAZEPAM 0.5 MG PO TABS: 0.5000 mg | ORAL_TABLET | Freq: Two times a day (BID) | ORAL | Status: DC | PRN

## 2014-03-21 MED ORDER — METHOCARBAMOL 500 MG PO TABS: 500.0000 mg | ORAL_TABLET | Freq: Four times a day (QID) | ORAL | Status: DC | PRN

## 2014-03-21 MED ORDER — PHOSPHATIDYLSERINE-DHA-EPA 100-19.5-6.5 MG PO CAPS: 1.0000 | ORAL_CAPSULE | Freq: Every day | ORAL | Status: DC

## 2014-03-21 NOTE — Patient Instructions (Signed)
Preventive Care for Adults A healthy lifestyle and preventive care can promote health and wellness. Preventive health guidelines for women include the following key practices.  A routine yearly physical is a good way to check with your health care provider about your health and preventive screening. It is a chance to share any concerns and updates on your health and to receive a thorough exam.  Visit your dentist for a routine exam and preventive care every 6 months. Brush your teeth twice a day and floss once a day. Good oral hygiene prevents tooth decay and gum disease.  The frequency of eye exams is based on your age, health, family medical history, use of contact lenses, and other factors. Follow your health care provider's recommendations for frequency of eye exams.  Eat a healthy diet. Foods like vegetables, fruits, whole grains, low-fat dairy products, and lean protein foods contain the nutrients you need without too many calories. Decrease your intake of foods high in solid fats, added sugars, and salt. Eat the right amount of calories for you.Get information about a proper diet from your health care provider, if necessary.  Regular physical exercise is one of the most important things you can do for your health. Most adults should get at least 150 minutes of moderate-intensity exercise (any activity that increases your heart rate and causes you to sweat) each week. In addition, most adults need muscle-strengthening exercises on 2 or more days a week.  Maintain a healthy weight. The body mass index (BMI) is a screening tool to identify possible weight problems. It provides an estimate of body fat based on height and weight. Your health care provider can find your BMI and can help you achieve or maintain a healthy weight.For adults 20 years and older:  A BMI below 18.5 is considered underweight.  A BMI of 18.5 to 24.9 is normal.  A BMI of 25 to 29.9 is considered overweight.  A BMI of  30 and above is considered obese.  Maintain normal blood lipids and cholesterol levels by exercising and minimizing your intake of saturated fat. Eat a balanced diet with plenty of fruit and vegetables. Blood tests for lipids and cholesterol should begin at age 76 and be repeated every 5 years. If your lipid or cholesterol levels are high, you are over 50, or you are at high risk for heart disease, you may need your cholesterol levels checked more frequently.Ongoing high lipid and cholesterol levels should be treated with medicines if diet and exercise are not working.  If you smoke, find out from your health care provider how to quit. If you do not use tobacco, do not start.  Lung cancer screening is recommended for adults aged 22-80 years who are at high risk for developing lung cancer because of a history of smoking. A yearly low-dose CT scan of the lungs is recommended for people who have at least a 30-pack-year history of smoking and are a current smoker or have quit within the past 15 years. A pack year of smoking is smoking an average of 1 pack of cigarettes a day for 1 year (for example: 1 pack a day for 30 years or 2 packs a day for 15 years). Yearly screening should continue until the smoker has stopped smoking for at least 15 years. Yearly screening should be stopped for people who develop a health problem that would prevent them from having lung cancer treatment.  If you are pregnant, do not drink alcohol. If you are breastfeeding,  be very cautious about drinking alcohol. If you are not pregnant and choose to drink alcohol, do not have more than 1 drink per day. One drink is considered to be 12 ounces (355 mL) of beer, 5 ounces (148 mL) of wine, or 1.5 ounces (44 mL) of liquor.  Avoid use of street drugs. Do not share needles with anyone. Ask for help if you need support or instructions about stopping the use of drugs.  High blood pressure causes heart disease and increases the risk of  stroke. Your blood pressure should be checked at least every 1 to 2 years. Ongoing high blood pressure should be treated with medicines if weight loss and exercise do not work.  If you are 75-52 years old, ask your health care provider if you should take aspirin to prevent strokes.  Diabetes screening involves taking a blood sample to check your fasting blood sugar level. This should be done once every 3 years, after age 15, if you are within normal weight and without risk factors for diabetes. Testing should be considered at a younger age or be carried out more frequently if you are overweight and have at least 1 risk factor for diabetes.  Breast cancer screening is essential preventive care for women. You should practice "breast self-awareness." This means understanding the normal appearance and feel of your breasts and may include breast self-examination. Any changes detected, no matter how small, should be reported to a health care provider. Women in their 58s and 30s should have a clinical breast exam (CBE) by a health care provider as part of a regular health exam every 1 to 3 years. After age 16, women should have a CBE every year. Starting at age 53, women should consider having a mammogram (breast X-ray test) every year. Women who have a family history of breast cancer should talk to their health care provider about genetic screening. Women at a high risk of breast cancer should talk to their health care providers about having an MRI and a mammogram every year.  Breast cancer gene (BRCA)-related cancer risk assessment is recommended for women who have family members with BRCA-related cancers. BRCA-related cancers include breast, ovarian, tubal, and peritoneal cancers. Having family members with these cancers may be associated with an increased risk for harmful changes (mutations) in the breast cancer genes BRCA1 and BRCA2. Results of the assessment will determine the need for genetic counseling and  BRCA1 and BRCA2 testing.  Routine pelvic exams to screen for cancer are no longer recommended for nonpregnant women who are considered low risk for cancer of the pelvic organs (ovaries, uterus, and vagina) and who do not have symptoms. Ask your health care provider if a screening pelvic exam is right for you.  If you have had past treatment for cervical cancer or a condition that could lead to cancer, you need Pap tests and screening for cancer for at least 20 years after your treatment. If Pap tests have been discontinued, your risk factors (such as having a new sexual partner) need to be reassessed to determine if screening should be resumed. Some women have medical problems that increase the chance of getting cervical cancer. In these cases, your health care provider may recommend more frequent screening and Pap tests.  The HPV test is an additional test that may be used for cervical cancer screening. The HPV test looks for the virus that can cause the cell changes on the cervix. The cells collected during the Pap test can be  tested for HPV. The HPV test could be used to screen women aged 30 years and older, and should be used in women of any age who have unclear Pap test results. After the age of 30, women should have HPV testing at the same frequency as a Pap test.  Colorectal cancer can be detected and often prevented. Most routine colorectal cancer screening begins at the age of 50 years and continues through age 75 years. However, your health care provider may recommend screening at an earlier age if you have risk factors for colon cancer. On a yearly basis, your health care provider may provide home test kits to check for hidden blood in the stool. Use of a small camera at the end of a tube, to directly examine the colon (sigmoidoscopy or colonoscopy), can detect the earliest forms of colorectal cancer. Talk to your health care provider about this at age 50, when routine screening begins. Direct  exam of the colon should be repeated every 5-10 years through age 75 years, unless early forms of pre-cancerous polyps or small growths are found.  People who are at an increased risk for hepatitis B should be screened for this virus. You are considered at high risk for hepatitis B if:  You were born in a country where hepatitis B occurs often. Talk with your health care provider about which countries are considered high risk.  Your parents were born in a high-risk country and you have not received a shot to protect against hepatitis B (hepatitis B vaccine).  You have HIV or AIDS.  You use needles to inject street drugs.  You live with, or have sex with, someone who has hepatitis B.  You get hemodialysis treatment.  You take certain medicines for conditions like cancer, organ transplantation, and autoimmune conditions.  Hepatitis C blood testing is recommended for all people born from 1945 through 1965 and any individual with known risks for hepatitis C.  Practice safe sex. Use condoms and avoid high-risk sexual practices to reduce the spread of sexually transmitted infections (STIs). STIs include gonorrhea, chlamydia, syphilis, trichomonas, herpes, HPV, and human immunodeficiency virus (HIV). Herpes, HIV, and HPV are viral illnesses that have no cure. They can result in disability, cancer, and death.  You should be screened for sexually transmitted illnesses (STIs) including gonorrhea and chlamydia if:  You are sexually active and are younger than 24 years.  You are older than 24 years and your health care provider tells you that you are at risk for this type of infection.  Your sexual activity has changed since you were last screened and you are at an increased risk for chlamydia or gonorrhea. Ask your health care provider if you are at risk.  If you are at risk of being infected with HIV, it is recommended that you take a prescription medicine daily to prevent HIV infection. This is  called preexposure prophylaxis (PrEP). You are considered at risk if:  You are a heterosexual woman, are sexually active, and are at increased risk for HIV infection.  You take drugs by injection.  You are sexually active with a partner who has HIV.  Talk with your health care provider about whether you are at high risk of being infected with HIV. If you choose to begin PrEP, you should first be tested for HIV. You should then be tested every 3 months for as long as you are taking PrEP.  Osteoporosis is a disease in which the bones lose minerals and strength   with aging. This can result in serious bone fractures or breaks. The risk of osteoporosis can be identified using a bone density scan. Women ages 65 years and over and women at risk for fractures or osteoporosis should discuss screening with their health care providers. Ask your health care provider whether you should take a calcium supplement or vitamin D to reduce the rate of osteoporosis.  Menopause can be associated with physical symptoms and risks. Hormone replacement therapy is available to decrease symptoms and risks. You should talk to your health care provider about whether hormone replacement therapy is right for you.  Use sunscreen. Apply sunscreen liberally and repeatedly throughout the day. You should seek shade when your shadow is shorter than you. Protect yourself by wearing long sleeves, pants, a wide-brimmed hat, and sunglasses year round, whenever you are outdoors.  Once a month, do a whole body skin exam, using a mirror to look at the skin on your back. Tell your health care provider of new moles, moles that have irregular borders, moles that are larger than a pencil eraser, or moles that have changed in shape or color.  Stay current with required vaccines (immunizations).  Influenza vaccine. All adults should be immunized every year.  Tetanus, diphtheria, and acellular pertussis (Td, Tdap) vaccine. Pregnant women should  receive 1 dose of Tdap vaccine during each pregnancy. The dose should be obtained regardless of the length of time since the last dose. Immunization is preferred during the 27th-36th week of gestation. An adult who has not previously received Tdap or who does not know her vaccine status should receive 1 dose of Tdap. This initial dose should be followed by tetanus and diphtheria toxoids (Td) booster doses every 10 years. Adults with an unknown or incomplete history of completing a 3-dose immunization series with Td-containing vaccines should begin or complete a primary immunization series including a Tdap dose. Adults should receive a Td booster every 10 years.  Varicella vaccine. An adult without evidence of immunity to varicella should receive 2 doses or a second dose if she has previously received 1 dose. Pregnant females who do not have evidence of immunity should receive the first dose after pregnancy. This first dose should be obtained before leaving the health care facility. The second dose should be obtained 4-8 weeks after the first dose.  Human papillomavirus (HPV) vaccine. Females aged 13-26 years who have not received the vaccine previously should obtain the 3-dose series. The vaccine is not recommended for use in pregnant females. However, pregnancy testing is not needed before receiving a dose. If a female is found to be pregnant after receiving a dose, no treatment is needed. In that case, the remaining doses should be delayed until after the pregnancy. Immunization is recommended for any person with an immunocompromised condition through the age of 26 years if she did not get any or all doses earlier. During the 3-dose series, the second dose should be obtained 4-8 weeks after the first dose. The third dose should be obtained 24 weeks after the first dose and 16 weeks after the second dose.  Zoster vaccine. One dose is recommended for adults aged 60 years or older unless certain conditions are  present.  Measles, mumps, and rubella (MMR) vaccine. Adults born before 1957 generally are considered immune to measles and mumps. Adults born in 1957 or later should have 1 or more doses of MMR vaccine unless there is a contraindication to the vaccine or there is laboratory evidence of immunity to   each of the three diseases. A routine second dose of MMR vaccine should be obtained at least 28 days after the first dose for students attending postsecondary schools, health care workers, or international travelers. People who received inactivated measles vaccine or an unknown type of measles vaccine during 1963-1967 should receive 2 doses of MMR vaccine. People who received inactivated mumps vaccine or an unknown type of mumps vaccine before 1979 and are at high risk for mumps infection should consider immunization with 2 doses of MMR vaccine. For females of childbearing age, rubella immunity should be determined. If there is no evidence of immunity, females who are not pregnant should be vaccinated. If there is no evidence of immunity, females who are pregnant should delay immunization until after pregnancy. Unvaccinated health care workers born before 1957 who lack laboratory evidence of measles, mumps, or rubella immunity or laboratory confirmation of disease should consider measles and mumps immunization with 2 doses of MMR vaccine or rubella immunization with 1 dose of MMR vaccine.  Pneumococcal 13-valent conjugate (PCV13) vaccine. When indicated, a person who is uncertain of her immunization history and has no record of immunization should receive the PCV13 vaccine. An adult aged 19 years or older who has certain medical conditions and has not been previously immunized should receive 1 dose of PCV13 vaccine. This PCV13 should be followed with a dose of pneumococcal polysaccharide (PPSV23) vaccine. The PPSV23 vaccine dose should be obtained at least 8 weeks after the dose of PCV13 vaccine. An adult aged 19  years or older who has certain medical conditions and previously received 1 or more doses of PPSV23 vaccine should receive 1 dose of PCV13. The PCV13 vaccine dose should be obtained 1 or more years after the last PPSV23 vaccine dose.  Pneumococcal polysaccharide (PPSV23) vaccine. When PCV13 is also indicated, PCV13 should be obtained first. All adults aged 65 years and older should be immunized. An adult younger than age 65 years who has certain medical conditions should be immunized. Any person who resides in a nursing home or long-term care facility should be immunized. An adult smoker should be immunized. People with an immunocompromised condition and certain other conditions should receive both PCV13 and PPSV23 vaccines. People with human immunodeficiency virus (HIV) infection should be immunized as soon as possible after diagnosis. Immunization during chemotherapy or radiation therapy should be avoided. Routine use of PPSV23 vaccine is not recommended for American Indians, [place] Natives, or people younger than 65 years unless there are medical conditions that require PPSV23 vaccine. When indicated, people who have unknown immunization and have no record of immunization should receive PPSV23 vaccine. One-time revaccination 5 years after the first dose of PPSV23 is recommended for people aged 19-64 years who have chronic kidney failure, nephrotic syndrome, asplenia, or immunocompromised conditions. People who received 1-2 doses of PPSV23 before age 65 years should receive another dose of PPSV23 vaccine at age 65 years or later if at least 5 years have passed since the previous dose. Doses of PPSV23 are not needed for people immunized with PPSV23 at or after age 65 years.  Meningococcal vaccine. Adults with asplenia or persistent complement component deficiencies should receive 2 doses of quadrivalent meningococcal conjugate (MenACWY-D) vaccine. The doses should be obtained at least 2 months apart.  Microbiologists working with certain meningococcal bacteria, military recruits, people at risk during an outbreak, and people who travel to or live in countries with a high rate of meningitis should be immunized. A first-year college student up through age   21 years who is living in a residence hall should receive a dose if she did not receive a dose on or after her 16th birthday. Adults who have certain high-risk conditions should receive one or more doses of vaccine.  Hepatitis A vaccine. Adults who wish to be protected from this disease, have certain high-risk conditions, work with hepatitis A-infected animals, work in hepatitis A research labs, or travel to or work in countries with a high rate of hepatitis A should be immunized. Adults who were previously unvaccinated and who anticipate close contact with an international adoptee during the first 60 days after arrival in the [place] from a country with a high rate of hepatitis A should be immunized.  Hepatitis B vaccine. Adults who wish to be protected from this disease, have certain high-risk conditions, may be exposed to blood or other infectious body fluids, are household contacts or sex partners of hepatitis B positive people, are clients or workers in certain care facilities, or travel to or work in countries with a high rate of hepatitis B should be immunized.  Haemophilus influenzae type b (Hib) vaccine. A previously unvaccinated person with asplenia or sickle cell disease or having a scheduled splenectomy should receive 1 dose of Hib vaccine. Regardless of previous immunization, a recipient of a hematopoietic stem cell transplant should receive a 3-dose series 6-12 months after her successful transplant. Hib vaccine is not recommended for adults with HIV infection. Preventive Services / Frequency Ages 64 to 68 years  Blood pressure check.** / Every 1 to 2 years.  Lipid and cholesterol check.** / Every 5 years beginning at age  22.  Clinical breast exam.** / Every 3 years for women in their 88s and 53s.  BRCA-related cancer risk assessment.** / For women who have family members with a BRCA-related cancer (breast, ovarian, tubal, or peritoneal cancers).  Pap test.** / Every 2 years from ages 90 through 51. Every 3 years starting at age 21 through age 56 or 3 with a history of 3 consecutive normal Pap tests.  HPV screening.** / Every 3 years from ages 24 through ages 1 to 46 with a history of 3 consecutive normal Pap tests.  Hepatitis C blood test.** / For any individual with known risks for hepatitis C.  Skin self-exam. / Monthly.  Influenza vaccine. / Every year.  Tetanus, diphtheria, and acellular pertussis (Tdap, Td) vaccine.** / Consult your health care provider. Pregnant women should receive 1 dose of Tdap vaccine during each pregnancy. 1 dose of Td every 10 years.  Varicella vaccine.** / Consult your health care provider. Pregnant females who do not have evidence of immunity should receive the first dose after pregnancy.  HPV vaccine. / 3 doses over 6 months, if 72 and younger. The vaccine is not recommended for use in pregnant females. However, pregnancy testing is not needed before receiving a dose.  Measles, mumps, rubella (MMR) vaccine.** / You need at least 1 dose of MMR if you were born in 1957 or later. You may also need a 2nd dose. For females of childbearing age, rubella immunity should be determined. If there is no evidence of immunity, females who are not pregnant should be vaccinated. If there is no evidence of immunity, females who are pregnant should delay immunization until after pregnancy.  Pneumococcal 13-valent conjugate (PCV13) vaccine.** / Consult your health care provider.  Pneumococcal polysaccharide (PPSV23) vaccine.** / 1 to 2 doses if you smoke cigarettes or if you have certain conditions.  Meningococcal vaccine.** /  1 dose if you are age 19 to 21 years and a first-year college  student living in a residence hall, or have one of several medical conditions, you need to get vaccinated against meningococcal disease. You may also need additional booster doses.  Hepatitis A vaccine.** / Consult your health care provider.  Hepatitis B vaccine.** / Consult your health care provider.  Haemophilus influenzae type b (Hib) vaccine.** / Consult your health care provider. Ages 40 to 64 years  Blood pressure check.** / Every 1 to 2 years.  Lipid and cholesterol check.** / Every 5 years beginning at age 20 years.  Lung cancer screening. / Every year if you are aged 55-80 years and have a 30-pack-year history of smoking and currently smoke or have quit within the past 15 years. Yearly screening is stopped once you have quit smoking for at least 15 years or develop a health problem that would prevent you from having lung cancer treatment.  Clinical breast exam.** / Every year after age 40 years.  BRCA-related cancer risk assessment.** / For women who have family members with a BRCA-related cancer (breast, ovarian, tubal, or peritoneal cancers).  Mammogram.** / Every year beginning at age 40 years and continuing for as long as you are in good health. Consult with your health care provider.  Pap test.** / Every 3 years starting at age 30 years through age 65 or 70 years with a history of 3 consecutive normal Pap tests.  HPV screening.** / Every 3 years from ages 30 years through ages 65 to 70 years with a history of 3 consecutive normal Pap tests.  Fecal occult blood test (FOBT) of stool. / Every year beginning at age 50 years and continuing until age 75 years. You may not need to do this test if you get a colonoscopy every 10 years.  Flexible sigmoidoscopy or colonoscopy.** / Every 5 years for a flexible sigmoidoscopy or every 10 years for a colonoscopy beginning at age 50 years and continuing until age 75 years.  Hepatitis C blood test.** / For all people born from 1945 through  1965 and any individual with known risks for hepatitis C.  Skin self-exam. / Monthly.  Influenza vaccine. / Every year.  Tetanus, diphtheria, and acellular pertussis (Tdap/Td) vaccine.** / Consult your health care provider. Pregnant women should receive 1 dose of Tdap vaccine during each pregnancy. 1 dose of Td every 10 years.  Varicella vaccine.** / Consult your health care provider. Pregnant females who do not have evidence of immunity should receive the first dose after pregnancy.  Zoster vaccine.** / 1 dose for adults aged 60 years or older.  Measles, mumps, rubella (MMR) vaccine.** / You need at least 1 dose of MMR if you were born in 1957 or later. You may also need a 2nd dose. For females of childbearing age, rubella immunity should be determined. If there is no evidence of immunity, females who are not pregnant should be vaccinated. If there is no evidence of immunity, females who are pregnant should delay immunization until after pregnancy.  Pneumococcal 13-valent conjugate (PCV13) vaccine.** / Consult your health care provider.  Pneumococcal polysaccharide (PPSV23) vaccine.** / 1 to 2 doses if you smoke cigarettes or if you have certain conditions.  Meningococcal vaccine.** / Consult your health care provider.  Hepatitis A vaccine.** / Consult your health care provider.  Hepatitis B vaccine.** / Consult your health care provider.  Haemophilus influenzae type b (Hib) vaccine.** / Consult your health care provider. Ages 65   years and over  Blood pressure check.** / Every 1 to 2 years.  Lipid and cholesterol check.** / Every 5 years beginning at age 22 years.  Lung cancer screening. / Every year if you are aged 73-80 years and have a 30-pack-year history of smoking and currently smoke or have quit within the past 15 years. Yearly screening is stopped once you have quit smoking for at least 15 years or develop a health problem that would prevent you from having lung cancer  treatment.  Clinical breast exam.** / Every year after age 4 years.  BRCA-related cancer risk assessment.** / For women who have family members with a BRCA-related cancer (breast, ovarian, tubal, or peritoneal cancers).  Mammogram.** / Every year beginning at age 40 years and continuing for as long as you are in good health. Consult with your health care provider.  Pap test.** / Every 3 years starting at age 9 years through age 34 or 91 years with 3 consecutive normal Pap tests. Testing can be stopped between 65 and 70 years with 3 consecutive normal Pap tests and no abnormal Pap or HPV tests in the past 10 years.  HPV screening.** / Every 3 years from ages 57 years through ages 64 or 45 years with a history of 3 consecutive normal Pap tests. Testing can be stopped between 65 and 70 years with 3 consecutive normal Pap tests and no abnormal Pap or HPV tests in the past 10 years.  Fecal occult blood test (FOBT) of stool. / Every year beginning at age 15 years and continuing until age 17 years. You may not need to do this test if you get a colonoscopy every 10 years.  Flexible sigmoidoscopy or colonoscopy.** / Every 5 years for a flexible sigmoidoscopy or every 10 years for a colonoscopy beginning at age 86 years and continuing until age 71 years.  Hepatitis C blood test.** / For all people born from [birth year] through 1964 and any individual with known risks for hepatitis C.  Osteoporosis screening.** / A one-time screening for women ages 83 years and over and women at risk for fractures or osteoporosis.  Skin self-exam. / Monthly.  Influenza vaccine. / Every year.  Tetanus, diphtheria, and acellular pertussis (Tdap/Td) vaccine.** / 1 dose of Td every 10 years.  Varicella vaccine.** / Consult your health care provider.  Zoster vaccine.** / 1 dose for adults aged 61 years or older.  Pneumococcal 13-valent conjugate (PCV13) vaccine.** / Consult your health care provider.  Pneumococcal  polysaccharide (PPSV23) vaccine.** / 1 dose for all adults aged 28 years and older.  Meningococcal vaccine.** / Consult your health care provider.  Hepatitis A vaccine.** / Consult your health care provider.  Hepatitis B vaccine.** / Consult your health care provider.  Haemophilus influenzae type b (Hib) vaccine.** / Consult your health care provider. ** Family history and personal history of risk and conditions may change your health care provider's recommendations. Document Released: 08/11/2001 Document Revised: 10/30/2013 Document Reviewed: 11/10/2010 [facility] Patient Information 2015 [place], [place]. This information is not intended to replace advice given to you by your health care provider. Make sure you discuss any questions you have with your health care provider.

## 2014-03-21 NOTE — Assessment & Plan Note (Signed)
Continue with current prescription therapy as reflected on the Med list.  

## 2014-03-21 NOTE — Progress Notes (Signed)
Pre visit review using our clinic review tool, if applicable. No additional management support is needed unless otherwise documented below in the visit note. 

## 2014-03-21 NOTE — Progress Notes (Signed)
   Subjective:    HPI  The patient is here for a wellness exam.   Moving to a new house in [place] this Fall (2015).  The patient needs to address  chronic hypertension that has been well controlled with medicines, LBP, anxiety, neuropathy  C/o mild focus and memory issues - new    Review of Systems  Constitutional: Negative for chills, activity change, appetite change, fatigue and unexpected weight change.  HENT: Negative for congestion, mouth sores and sinus pressure.   Eyes: Negative for visual disturbance.  Respiratory: Negative for cough and chest tightness.   Gastrointestinal: Negative for nausea and abdominal pain.  Genitourinary: Negative for frequency, difficulty urinating and vaginal pain.  Musculoskeletal: Positive for back pain and gait problem.  Skin: Negative for pallor and rash.  Neurological: Negative for dizziness, tremors, weakness, numbness and headaches.  Psychiatric/Behavioral: Negative for confusion, sleep disturbance and decreased concentration.       Objective:   Physical Exam  Constitutional: She appears well-developed and well-nourished. No distress.  HENT:  Head: Normocephalic.  Right Ear: External ear normal.  Left Ear: External ear normal.  Nose: Nose normal.  Mouth/Throat: Oropharynx is clear and moist.  Eyes: Conjunctivae are normal. Pupils are equal, round, and reactive to light. Right eye exhibits no discharge. Left eye exhibits no discharge.  Neck: Normal range of motion. Neck supple. No JVD present. No tracheal deviation present. No thyromegaly present.  Cardiovascular: Normal rate, regular rhythm and normal heart sounds.   Pulmonary/Chest: No stridor. No respiratory distress. She has no wheezes.  Abdominal: Soft. Bowel sounds are normal. She exhibits no distension and no mass. There is no tenderness. There is no rebound and no guarding.  Musculoskeletal: She exhibits no edema and no tenderness.  Lymphadenopathy:    She has no  cervical adenopathy.  Neurological: She displays normal reflexes. No cranial nerve deficit. She exhibits normal muscle tone. Coordination normal.  Skin: No rash noted. No erythema.  Psychiatric: Her behavior is normal. Judgment and thought content normal.  a/o/c  Wt Readings from Last 3 Encounters:  03/21/14 160 lb (72.576 kg)  01/17/14 159 lb (72.122 kg)  11/13/13 163 lb (73.936 kg)   Lab Results  Component Value Date   WBC 4.9 03/13/2013   HGB 13.0 03/13/2013   HCT 38.3 03/13/2013   PLT 231.0 03/13/2013   CHOL 183 03/13/2013   TRIG 76.0 03/13/2013   HDL 56.30 03/13/2013   ALT 21 03/13/2013   AST 27 03/13/2013   NA 139 03/13/2013   K 4.7 03/13/2013   CL 106 03/13/2013   CREATININE 0.9 03/13/2013   BUN 17 03/13/2013   CO2 27 03/13/2013   TSH 4.47 03/13/2013   INR 0.90 08/05/2009   HGBA1C 5.7 05/20/2007         Assessment & Plan:

## 2014-03-21 NOTE — Assessment & Plan Note (Signed)

## 2014-03-22 ENCOUNTER — Telehealth: Payer: Self-pay | Admitting: *Deleted

## 2014-03-22 NOTE — Assessment & Plan Note (Signed)
MCD

## 2014-03-22 NOTE — Assessment & Plan Note (Signed)
Mild cognitive dysfunction 2015 Discussed Vayacog offered Stress reduction, rest, good sleep. Denies OSA

## 2014-03-22 NOTE — Telephone Encounter (Signed)
I told Mrs Abeln that it may not be covered. It is $50-55/month. There is no close analogues available. Thx

## 2014-03-22 NOTE — Telephone Encounter (Signed)
Called pt no answer LMOM with md response.../lmb 

## 2014-03-22 NOTE — Telephone Encounter (Signed)
Left msg on triage stating medication that md rx yesterday "Talbert Nan" is not covered by insurance. Wanting to know if md has samples or change to something else...Johny Chess

## 2014-03-22 NOTE — Assessment & Plan Note (Signed)
Continue with current prescription therapy as reflected on the Med list. On Lyrica

## 2014-03-27 ENCOUNTER — Other Ambulatory Visit: Payer: Self-pay | Admitting: *Deleted

## 2014-03-27 MED ORDER — MELOXICAM 15 MG PO TABS: 7.5000 mg | ORAL_TABLET | Freq: Every day | ORAL | Status: DC

## 2014-03-27 MED ORDER — ENALAPRIL MALEATE 5 MG PO TABS: 5.0000 mg | ORAL_TABLET | Freq: Two times a day (BID) | ORAL | Status: DC

## 2014-04-25 ENCOUNTER — Other Ambulatory Visit (INDEPENDENT_AMBULATORY_CARE_PROVIDER_SITE_OTHER): Payer: Medicare Other

## 2014-04-25 DIAGNOSIS — I10 Essential (primary) hypertension: Secondary | ICD-10-CM

## 2014-04-25 DIAGNOSIS — Z0000 Encounter for general adult medical examination without abnormal findings: Secondary | ICD-10-CM

## 2014-04-25 LAB — HEPATIC FUNCTION PANEL
ALT: 24 U/L (ref 0–35)
AST: 29 U/L (ref 0–37)
Albumin: 3.5 g/dL (ref 3.5–5.2)
Alkaline Phosphatase: 62 U/L (ref 39–117)
BILIRUBIN DIRECT: 0.1 mg/dL (ref 0.0–0.3)
TOTAL PROTEIN: 6.8 g/dL (ref 6.0–8.3)
Total Bilirubin: 0.8 mg/dL (ref 0.2–1.2)

## 2014-04-25 LAB — CBC WITH DIFFERENTIAL/PLATELET
BASOS ABS: 0 10*3/uL (ref 0.0–0.1)
Basophils Relative: 0.7 % (ref 0.0–3.0)
Eosinophils Absolute: 0.1 10*3/uL (ref 0.0–0.7)
Eosinophils Relative: 2.1 % (ref 0.0–5.0)
HCT: 40.8 % (ref 36.0–46.0)
HEMOGLOBIN: 13.6 g/dL (ref 12.0–15.0)
LYMPHS PCT: 31.9 % (ref 12.0–46.0)
Lymphs Abs: 1.7 10*3/uL (ref 0.7–4.0)
MCHC: 33.3 g/dL (ref 30.0–36.0)
MCV: 93.5 fl (ref 78.0–100.0)
MONOS PCT: 9.7 % (ref 3.0–12.0)
Monocytes Absolute: 0.5 10*3/uL (ref 0.1–1.0)
Neutro Abs: 3 10*3/uL (ref 1.4–7.7)
Neutrophils Relative %: 55.6 % (ref 43.0–77.0)
Platelets: 236 10*3/uL (ref 150.0–400.0)
RBC: 4.37 Mil/uL (ref 3.87–5.11)
RDW: 13.4 % (ref 11.5–15.5)
WBC: 5.3 10*3/uL (ref 4.0–10.5)

## 2014-04-25 LAB — URINALYSIS
Bilirubin Urine: NEGATIVE
Hgb urine dipstick: NEGATIVE
Ketones, ur: NEGATIVE
Leukocytes, UA: NEGATIVE
NITRITE: NEGATIVE
Specific Gravity, Urine: 1.02 (ref 1.000–1.030)
TOTAL PROTEIN, URINE-UPE24: NEGATIVE
UROBILINOGEN UA: 0.2 (ref 0.0–1.0)
Urine Glucose: NEGATIVE
pH: 6.5 (ref 5.0–8.0)

## 2014-04-25 LAB — BASIC METABOLIC PANEL
BUN: 17 mg/dL (ref 6–23)
CHLORIDE: 106 meq/L (ref 96–112)
CO2: 27 meq/L (ref 19–32)
Calcium: 9 mg/dL (ref 8.4–10.5)
Creatinine, Ser: 0.8 mg/dL (ref 0.4–1.2)
GFR: 72.79 mL/min (ref >60.00)
Glucose, Bld: 91 mg/dL (ref 70–99)
Potassium: 4.6 mEq/L (ref 3.5–5.1)
Sodium: 140 mEq/L (ref 135–145)

## 2014-04-25 LAB — LIPID PANEL
CHOLESTEROL: 187 mg/dL (ref 0–200)
HDL: 57.7 mg/dL (ref >39.00)
LDL Cholesterol: 118 mg/dL — ABNORMAL HIGH (ref 0–99)
NonHDL: 129.3
Total CHOL/HDL Ratio: 3
Triglycerides: 55 mg/dL (ref 0.0–149.0)
VLDL: 11 mg/dL (ref 0.0–40.0)

## 2014-04-25 LAB — TSH: TSH: 2.87 u[IU]/mL (ref 0.35–4.50)

## 2014-05-14 ENCOUNTER — Other Ambulatory Visit: Payer: Self-pay | Admitting: Internal Medicine

## 2014-06-25 ENCOUNTER — Other Ambulatory Visit: Payer: Self-pay | Admitting: Internal Medicine

## 2014-09-18 DIAGNOSIS — R300 Dysuria: Secondary | ICD-10-CM | POA: Diagnosis not present

## 2014-09-19 ENCOUNTER — Ambulatory Visit: Payer: Medicare Other | Admitting: Internal Medicine

## 2014-09-25 ENCOUNTER — Other Ambulatory Visit: Payer: Self-pay | Admitting: Gastroenterology

## 2014-10-02 ENCOUNTER — Encounter: Payer: Self-pay | Admitting: Internal Medicine

## 2014-10-02 ENCOUNTER — Ambulatory Visit (INDEPENDENT_AMBULATORY_CARE_PROVIDER_SITE_OTHER): Payer: Medicare Other | Admitting: Internal Medicine

## 2014-10-02 VITALS — BP 130/80 | HR 90 | Wt 159.0 lb

## 2014-10-02 DIAGNOSIS — K589 Irritable bowel syndrome without diarrhea: Secondary | ICD-10-CM

## 2014-10-02 DIAGNOSIS — I10 Essential (primary) hypertension: Secondary | ICD-10-CM | POA: Diagnosis not present

## 2014-10-02 DIAGNOSIS — F411 Generalized anxiety disorder: Secondary | ICD-10-CM | POA: Diagnosis not present

## 2014-10-02 DIAGNOSIS — K219 Gastro-esophageal reflux disease without esophagitis: Secondary | ICD-10-CM

## 2014-10-02 MED ORDER — PANTOPRAZOLE SODIUM 40 MG PO TBEC: DELAYED_RELEASE_TABLET | ORAL | Status: DC

## 2014-10-02 MED ORDER — METHOCARBAMOL 500 MG PO TABS: 500.0000 mg | ORAL_TABLET | Freq: Four times a day (QID) | ORAL | Status: DC | PRN

## 2014-10-02 MED ORDER — MELOXICAM 15 MG PO TABS: 7.5000 mg | ORAL_TABLET | Freq: Every day | ORAL | Status: DC

## 2014-10-02 MED ORDER — AMLODIPINE BESYLATE 5 MG PO TABS: ORAL_TABLET | ORAL | Status: DC

## 2014-10-02 MED ORDER — ENALAPRIL MALEATE 5 MG PO TABS: 5.0000 mg | ORAL_TABLET | Freq: Two times a day (BID) | ORAL | Status: DC

## 2014-10-02 NOTE — Assessment & Plan Note (Signed)
On Protonx

## 2014-10-02 NOTE — Assessment & Plan Note (Signed)
Lorazepam - rare

## 2014-10-02 NOTE — Assessment & Plan Note (Signed)
Amlodipine, Enalapril 

## 2014-10-02 NOTE — Assessment & Plan Note (Signed)
  On diet  

## 2014-10-02 NOTE — Progress Notes (Signed)
Pre visit review using our clinic review tool, if applicable. No additional management support is needed unless otherwise documented below in the visit note. 

## 2014-10-02 NOTE — Progress Notes (Signed)
   Subjective:    HPI    Pt moved to a new house in [place] last Fall (2015).  The patient needs to address  chronic hypertension that has been well controlled with medicines, LBP, anxiety, neuropathy  F/u mild focus and memory issues - new    Review of Systems  Constitutional: Negative for chills, activity change, appetite change, fatigue and unexpected weight change.  HENT: Negative for congestion, mouth sores and sinus pressure.   Eyes: Negative for visual disturbance.  Respiratory: Negative for cough and chest tightness.   Gastrointestinal: Negative for nausea and abdominal pain.  Genitourinary: Negative for frequency, difficulty urinating and vaginal pain.  Musculoskeletal: Positive for back pain and gait problem.  Skin: Negative for pallor and rash.  Neurological: Negative for dizziness, tremors, weakness, numbness and headaches.  Psychiatric/Behavioral: Negative for confusion, sleep disturbance and decreased concentration.       Objective:   Physical Exam  Constitutional: She appears well-developed and well-nourished. No distress.  HENT:  Head: Normocephalic.  Right Ear: External ear normal.  Left Ear: External ear normal.  Nose: Nose normal.  Mouth/Throat: Oropharynx is clear and moist.  Eyes: Conjunctivae are normal. Pupils are equal, round, and reactive to light. Right eye exhibits no discharge. Left eye exhibits no discharge.  Neck: Normal range of motion. Neck supple. No JVD present. No tracheal deviation present. No thyromegaly present.  Cardiovascular: Normal rate, regular rhythm and normal heart sounds.   Pulmonary/Chest: No stridor. No respiratory distress. She has no wheezes.  Abdominal: Soft. Bowel sounds are normal. She exhibits no distension and no mass. There is no tenderness. There is no rebound and no guarding.  Musculoskeletal: She exhibits no edema or tenderness.  Lymphadenopathy:    She has no cervical adenopathy.  Neurological: She displays  normal reflexes. No cranial nerve deficit. She exhibits normal muscle tone. Coordination normal.  Skin: No rash noted. No erythema.  Psychiatric: Her behavior is normal. Judgment and thought content normal.  a/o/c  Wt Readings from Last 3 Encounters:  10/02/14 159 lb (72.122 kg)  03/21/14 160 lb (72.576 kg)  01/17/14 159 lb (72.122 kg)   Lab Results  Component Value Date   WBC 5.3 04/25/2014   HGB 13.6 04/25/2014   HCT 40.8 04/25/2014   PLT 236.0 04/25/2014   CHOL 187 04/25/2014   TRIG 55.0 04/25/2014   HDL 57.70 04/25/2014   ALT 24 04/25/2014   AST 29 04/25/2014   NA 140 04/25/2014   K 4.6 04/25/2014   CL 106 04/25/2014   CREATININE 0.8 04/25/2014   BUN 17 04/25/2014   CO2 27 04/25/2014   TSH 2.87 04/25/2014   INR 0.90 08/05/2009   HGBA1C 5.7 05/20/2007         Assessment & Plan:

## 2014-10-03 ENCOUNTER — Telehealth: Payer: Self-pay | Admitting: Internal Medicine

## 2014-10-03 NOTE — Telephone Encounter (Signed)
emmi emailed °

## 2014-12-05 ENCOUNTER — Encounter: Payer: Self-pay | Admitting: Gastroenterology

## 2014-12-06 ENCOUNTER — Telehealth: Payer: Self-pay

## 2014-12-06 NOTE — Telephone Encounter (Signed)
Patient requesting refill of Levbid which she states she takes as needed.  Last refill 08/2014.  Last office visit 12/2013.  Ok to refill?

## 2014-12-11 MED ORDER — HYOSCYAMINE SULFATE ER 0.375 MG PO TB12: ORAL_TABLET | ORAL | Status: DC

## 2014-12-11 NOTE — Telephone Encounter (Signed)
Refilled Levsin 

## 2014-12-24 ENCOUNTER — Other Ambulatory Visit: Payer: Self-pay

## 2015-01-17 ENCOUNTER — Telehealth: Payer: Self-pay | Admitting: Internal Medicine

## 2015-01-25 NOTE — Telephone Encounter (Signed)
Called [facility] to verify they had, in fact, received the rx for Omeprazole I sent 7-12.  They verified they had received it but patient had not come to pick it up so they put in back on the shelf.  The said the would refill it.  Left message for patient that Omeprazole being refilled at [facility]

## 2015-02-26 ENCOUNTER — Other Ambulatory Visit: Payer: Self-pay | Admitting: Internal Medicine

## 2015-03-12 ENCOUNTER — Ambulatory Visit (INDEPENDENT_AMBULATORY_CARE_PROVIDER_SITE_OTHER): Payer: Medicare Other | Admitting: Internal Medicine

## 2015-03-12 VITALS — BP 148/70 | HR 83 | Temp 100.0°F | Resp 16 | Ht 64.0 in | Wt 162.0 lb

## 2015-03-12 DIAGNOSIS — R509 Fever, unspecified: Secondary | ICD-10-CM | POA: Diagnosis not present

## 2015-03-12 DIAGNOSIS — J069 Acute upper respiratory infection, unspecified: Secondary | ICD-10-CM | POA: Diagnosis not present

## 2015-03-12 DIAGNOSIS — R52 Pain, unspecified: Secondary | ICD-10-CM

## 2015-03-12 LAB — POCT CBC
Granulocyte percent: 83.8 %G — AB (ref 37–80)
HCT, POC: 40.6 % (ref 37.7–47.9)
Hemoglobin: 13.2 g/dL (ref 12.2–16.2)
LYMPH, POC: 1 (ref 0.6–3.4)
MCH, POC: 30.5 pg (ref 27–31.2)
MCHC: 32.4 g/dL (ref 31.8–35.4)
MCV: 94 fL (ref 80–97)
MID (CBC): 0.3 (ref 0–0.9)
MPV: 8.1 fL (ref 0–99.8)
POC Granulocyte: 7.1 — AB (ref 2–6.9)
POC LYMPH PERCENT: 12.1 %L (ref 10–50)
POC MID %: 4.1 %M (ref 0–12)
Platelet Count, POC: 196 10*3/uL (ref 142–424)
RBC: 4.32 M/uL (ref 4.04–5.48)
RDW, POC: 14.3 %
WBC: 8.5 10*3/uL (ref 4.6–10.2)

## 2015-03-12 LAB — POCT INFLUENZA A/B
INFLUENZA A, POC: NEGATIVE
INFLUENZA B, POC: NEGATIVE

## 2015-03-12 MED ORDER — IPRATROPIUM BROMIDE 0.03 % NA SOLN: 2.0000 | Freq: Two times a day (BID) | NASAL | Status: DC

## 2015-03-12 MED ORDER — GUAIFENESIN ER 1200 MG PO TB12
1.0000 | ORAL_TABLET | Freq: Two times a day (BID) | ORAL | Status: DC | PRN
Start: 2015-03-12 — End: 2015-04-18

## 2015-03-12 MED ORDER — HYDROCOD POLST-CPM POLST ER 10-8 MG/5ML PO SUER: 5.0000 mL | Freq: Two times a day (BID) | ORAL | Status: DC | PRN

## 2015-03-12 NOTE — Patient Instructions (Signed)
Drink plenty of water (64 oz/day) and get plenty of rest. If you have been prescribed a cough syrup, do not drive or operate heavy machinery while using this medication. If you have been prescribed a nasal spray, use twice a day. Take mucinex twice a day If your symptoms are not improving in 7 days, return to clinic. Return if still having fever in 72 hours.

## 2015-03-12 NOTE — Progress Notes (Signed)
[facility] [address] [place] [address] [phone]  Date:  03/12/2015   Name:  Haley Armstrong   DOB:  [birth year]   MRN:  [id number]  PCP:  Walker Kehr, MD    Chief Complaint: Sinusitis; Headache; Generalized Body Aches; Chest Pain; Shoulder Pain; and Back Pain   History of Present Illness:  This is a 69 y.o. female with PMH HTN, IBS, anxiety, OA who is presenting with 1 day of nasal congestion, body aches and fever. Started with bilateral shoulder pain last night and some slight nasal congestion. Took zyrtec and 4 robaxin last night. Tonight she woke and she was much worse. She now has sore throat. She has been cold and hot off and on today. Did not check temp. Temp here 100.0. She is having full body aches in her legs and hips. She has a cough that is dry. She feels she needs to cough something up but nothing coming up. Chest feels tight with coughing. Has a history of allergies but no history of asthma. Not a smoker.  Review of Systems:  Review of Systems See HPI  Patient Active Problem List   Diagnosis Date Noted  . Well adult exam 03/09/2013  . Edema 03/09/2013  . Pain in left foot 03/08/2013  . ADD (attention deficit disorder) 06/15/2012  . Compulsive behavior disorder 06/15/2012  . Acute left lumbar radiculopathy 10/18/2011  . Iron deficiency anemia 10/21/2010  . History of esophageal spasm 10/21/2010  . FATIGUE, ACUTE 07/02/2009  . TOBACCO USE, QUIT 07/02/2009  . NONSPEC ABN FINDNG RAD & OTH EXAM ABDOMINAL AREA 04/11/2009  . Diarrhea 04/09/2009  . ABDOMINAL PAIN-EPIGASTRIC 04/09/2009  . Anxiety state 02/24/2009  . Rash and other nonspecific skin eruption 09/30/2007  . Memory loss 09/21/2007  . Goiter, unspecified 09/09/2007  . Essential hypertension 05/24/2007  . PARESTHESIA 05/24/2007  . Cervicalgia 05/20/2007  . Other abnormal glucose 05/20/2007  . GERD 04/06/2007  . Irritable bowel syndrome 04/06/2007  . OSTEOARTHRITIS  04/06/2007  . LOW BACK PAIN 04/06/2007  . TROCHANTERIC BURSITIS 04/06/2007    Prior to Admission medications   Medication Sig Start Date End Date Taking? Authorizing Provider  AMBULATORY NON FORMULARY MEDICATION Medication Name: Mylanta and Lidocaine   Take 30-60 cc by mouth twice a day as needed 12/07/13  Yes Irene Shipper, MD  amLODipine (NORVASC) 5 MG tablet TAKE ONE (1/2) TABLET BY MOUTH EVERY DAY 10/02/14  Yes Aleksei [name] V, MD  enalapril (VASOTEC) 5 MG tablet Take 1 tablet (5 mg total) by mouth 2 (two) times daily. 10/02/14  Yes Aleksei [name] V, MD  hyoscyamine (LEVBID) 0.375 MG 12 hr tablet TAKE ONE (1) TABLET BY MOUTH EVERY 12 HOURS AS NEEDED 12/11/14  Yes Irene Shipper, MD  hyoscyamine (LEVSIN SL) 0.125 MG SL tablet Use one to two tablets sublingually every 4 hours as needed for pain 09/21/13  Yes Irene Shipper, MD  LORazepam (ATIVAN) 0.5 MG tablet TAKE ONE (1) TABLET BY MOUTH 3 TIMES DAILY AS NEEDED BETWEEN MEALS AND BEDTIME FOR ANXIETY 02/28/15  Yes Aleksei [name] V, MD  meloxicam (MOBIC) 15 MG tablet Take 0.5-1 tablets (7.5-15 mg total) by mouth daily. 10/02/14  Yes Aleksei [name] V, MD  methocarbamol (ROBAXIN) 500 MG tablet Take 1 tablet (500 mg total) by mouth 4 (four) times daily as needed. 10/02/14  Yes Aleksei [name] V, MD  Methylcellulose, Laxative, (CITRUCEL PO) Take by mouth. One once daily    Yes Historical  Provider, MD  Multiple Vitamins-Minerals (MULTIVITAMIN,TX-MINERALS) tablet Take 1 tablet by mouth daily.     Yes Historical Provider, MD  pantoprazole (PROTONIX) 40 MG tablet TAKE ONE (1) TABLET BY MOUTH TWO (2) TIMES DAILY 10/02/14  Yes Aleksei [name] V, MD  Phosphatidylserine-DHA-EPA (VAYACOG) 100-19.5-6.5 MG CAPS Take 1 capsule by mouth daily. 03/21/14  Yes Aleksei [name] V, MD  pregabalin (LYRICA) 150 MG capsule Take 1 capsule (150 mg total) by mouth 2 (two) times daily. 11/17/13  Yes Aleksei [name] V, MD  APAP-Isometheptene-Dichloral (EPIDRIN) 325-65-100 MG  CAPS Take by mouth 2 (two) times daily as needed.      Historical Provider, MD  aspirin 81 MG EC tablet Take 81 mg by mouth 2 (two) times daily.      Historical Provider, MD  b complex vitamins tablet Take 1 tablet by mouth daily.      Historical Provider, MD  Cholecalciferol 1000 UNITS tablet Take 1,000 Units by mouth daily.      Historical Provider, MD  Diclofenac Sodium (PENNSAID) 1.5 % SOLN APPLY 3 TO 5 DROPS ON SKIN THREE TIMES A DAY FOR PAIN Patient not taking: Reported on 03/12/2015 03/08/13   Aleksei [name] V, MD  Sennosides (SENNA LAX PO) Take by mouth. As needed     Historical Provider, MD    No Known Allergies  Past Surgical History  Procedure Laterality Date  . Appendectomy    . Cholecystectomy    . Cervical fusion  2011    C4-5 Dr Marcial Pacas  . Esophageal manometry N/A 11/13/2013    Procedure: ESOPHAGEAL MANOMETRY (EM);  Surgeon: Irene Shipper, MD;  Location: [facility];  Service: Endoscopy;  Laterality: N/A;    Social History  Substance Use Topics  . Smoking status: Former Research scientist (life sciences)  . Smokeless tobacco: Never Used  . Alcohol Use: Yes     Comment: once a week     Family History  Problem Relation Age of Onset  . Lymphoma Mother   . Dementia Mother   . Cancer Mother 1    uterine sarcoma  . Parkinsonism Father   . Heart disease Brother     CAD  . Colon cancer Neg Hx     Medication list has been reviewed and updated.  Physical Examination:  Physical Exam  Constitutional: She is oriented to person, place, and time. She appears well-developed and well-nourished. No distress.  HENT:  Head: Normocephalic and atraumatic.  Right Ear: Hearing, tympanic membrane, external ear and ear canal normal.  Left Ear: Hearing, tympanic membrane, external ear and ear canal normal.  Nose: Nose normal.  Mouth/Throat: Uvula is midline and mucous membranes are normal. Posterior oropharyngeal erythema present. No oropharyngeal exudate or posterior oropharyngeal edema.  Eyes:  Conjunctivae and lids are normal. Right eye exhibits no discharge. Left eye exhibits no discharge. No scleral icterus.  Cardiovascular: Normal rate, regular rhythm, normal heart sounds and normal pulses.   No murmur heard. Pulmonary/Chest: Effort normal and breath sounds normal. No respiratory distress. She has no wheezes. She has no rhonchi. She has no rales.  Musculoskeletal: Normal range of motion.  Lymphadenopathy:       Head (right side): No submental, no submandibular and no tonsillar adenopathy present.       Head (left side): No submental, no submandibular and no tonsillar adenopathy present.    She has no cervical adenopathy.  Neurological: She is alert and oriented to person, place, and time.  Skin: Skin is warm, dry and intact. No lesion and  no rash noted.  Psychiatric: She has a normal mood and affect. Her speech is normal and behavior is normal. Thought content normal.   BP 148/70 mmHg  Pulse 83  Temp(Src) 100 F (37.8 C) (Oral)  Resp 16  Ht 5\' 4"  (1.626 m)  Wt 162 lb (73.483 kg)  BMI 27.79 kg/m2  SpO2 98%  Results for orders placed or performed in visit on 03/12/15  POCT Influenza A/B  Result Value Ref Range   Influenza A, POC Negative Negative   Influenza B, POC Negative Negative  POCT CBC  Result Value Ref Range   WBC 8.5 4.6 - 10.2 K/uL   Lymph, poc 1.0 0.6 - 3.4   POC LYMPH PERCENT 12.1 10 - 50 %L   MID (cbc) 0.3 0 - 0.9   POC MID % 4.1 0 - 12 %M   POC Granulocyte 7.1 (A) 2 - 6.9   Granulocyte percent 83.8 (A) 37 - 80 %G   RBC 4.32 4.04 - 5.48 M/uL   Hemoglobin 13.2 12.2 - 16.2 g/dL   HCT, POC 40.6 37.7 - 47.9 %   MCV 94.0 80 - 97 fL   MCH, POC 30.5 27 - 31.2 pg   MCHC 32.4 31.8 - 35.4 g/dL   RDW, POC 14.3 %   Platelet Count, POC 196 142 - 424 K/uL   MPV 8.1 0 - 99.8 fL   Assessment and Plan:  1. Viral URI 2. Fever 3. Body aches Rapid flu negative, CBC wnl. Offered tamiflu for possible false negative test, pt decline. Focus is on supportive care,  see meds prescribed below. If still having fevers in 72 hours, return. If sx not starting to improve in 1 week, return or follow up with PCP. Work note given. - chlorpheniramine-HYDROcodone (TUSSIONEX PENNKINETIC ER) 10-8 MG/5ML SUER; Take 5 mLs by mouth every 12 (twelve) hours as needed for cough.  Dispense: 80 mL; Refill: 0 - ipratropium (ATROVENT) 0.03 % nasal spray; Place 2 sprays into both nostrils 2 (two) times daily.  Dispense: 30 mL; Refill: 0 - Guaifenesin (MUCINEX MAXIMUM STRENGTH) 1200 MG TB12; Take 1 tablet (1,200 mg total) by mouth every 12 (twelve) hours as needed.  Dispense: 14 tablet; Refill: 1 - POCT Influenza A/B - POCT CBC    Benjaman Pott. Drenda Freeze, MHS [facility]  03/14/2015 I have participated in the care of this patient with the Advanced Practice Provider and agree with Diagnosis and Plan as documented. Robert P. Laney Pastor, M.D.

## 2015-03-17 ENCOUNTER — Telehealth: Payer: Self-pay

## 2015-03-17 NOTE — Telephone Encounter (Signed)
Pt was seen here on 9/13 by Dr. Laney Pastor. She is not feeling any better and wants to know if she can be prescribed an antibiotic. Please advise at  [phone]

## 2015-03-18 ENCOUNTER — Ambulatory Visit (INDEPENDENT_AMBULATORY_CARE_PROVIDER_SITE_OTHER): Payer: Medicare Other | Admitting: Family Medicine

## 2015-03-18 ENCOUNTER — Ambulatory Visit (INDEPENDENT_AMBULATORY_CARE_PROVIDER_SITE_OTHER): Payer: Medicare Other

## 2015-03-18 VITALS — BP 118/68 | HR 91 | Temp 98.4°F | Resp 18 | Ht 64.0 in | Wt 160.0 lb

## 2015-03-18 DIAGNOSIS — J209 Acute bronchitis, unspecified: Secondary | ICD-10-CM

## 2015-03-18 MED ORDER — AZITHROMYCIN 250 MG PO TABS: ORAL_TABLET | ORAL | Status: DC

## 2015-03-18 MED ORDER — HYDROCODONE-HOMATROPINE 5-1.5 MG/5ML PO SYRP: 5.0000 mL | ORAL_SOLUTION | Freq: Three times a day (TID) | ORAL | Status: DC | PRN

## 2015-03-18 NOTE — Telephone Encounter (Signed)
Pt is currently in office now.

## 2015-03-18 NOTE — Patient Instructions (Signed)

## 2015-03-18 NOTE — Progress Notes (Signed)
Patient ID: Haley Armstrong, female   DOB: 07-24-1945, 69 y.o.   MRN: [id number]   This chart was scribed for Haley Haber, MD by [facility], medical scribe at [facility].The patient was seen in exam room 01 and the patient's care was started at 9:04 AM.  Patient ID: Haley Armstrong MRN: [id number], DOB: 1945-07-04, 69 y.o. Date of Encounter: 03/18/2015  Primary Physician: Walker Kehr, MD  Chief Complaint:  Chief Complaint  Patient presents with  . Cough    x 1 week  . Sinusitis    x 1 week  . Headache    x 1 week   HPI:  [name] is a 69 y.o. female who presents to [facility] for a follow up. Seen here 6 days ago, diagnosed for a viral URI. Recommended 1200 mg mucinex 12 hours as needed. Initially had a sore throat, fever and generalized body aches. Thought she had the flu. Today, she still has a productive cough, nasal drainage, and chest tightness but her fever has improved. Last mucinex was yesterday at 2:00 PM. She would like an x-ray.  Past Medical History  Diagnosis Date  . GERD (gastroesophageal reflux disease)   . Osteoarthritis   . Anemia, iron deficiency   . Hypertension   . Blood donor   . Anxiety   . IBS (irritable bowel syndrome)      Home Meds: Prior to Admission medications   Medication Sig Start Date End Date Taking? Authorizing Provider  AMBULATORY NON FORMULARY MEDICATION Medication Name: Mylanta and Lidocaine   Take 30-60 cc by mouth twice a day as needed 12/07/13  Yes Irene Shipper, MD  amLODipine (NORVASC) 5 MG tablet TAKE ONE (1/2) TABLET BY MOUTH EVERY DAY 10/02/14  Yes Aleksei [name] V, MD  APAP-Isometheptene-Dichloral (EPIDRIN) 325-65-100 MG CAPS Take by mouth 2 (two) times daily as needed.     Yes Historical Provider, MD  aspirin 81 MG EC tablet Take 81 mg by mouth 2 (two) times daily.     Yes Historical Provider, MD  b complex vitamins tablet Take 1 tablet by mouth daily.     Yes Historical  Provider, MD  chlorpheniramine-HYDROcodone (TUSSIONEX PENNKINETIC ER) 10-8 MG/5ML SUER Take 5 mLs by mouth every 12 (twelve) hours as needed for cough. 03/12/15  Yes Ezekiel Slocumb, PA-C  Cholecalciferol 1000 UNITS tablet Take 1,000 Units by mouth daily.     Yes Historical Provider, MD  Diclofenac Sodium (PENNSAID) 1.5 % SOLN APPLY 3 TO 5 DROPS ON SKIN THREE TIMES A DAY FOR PAIN 03/08/13  Yes Aleksei [name] V, MD  enalapril (VASOTEC) 5 MG tablet Take 1 tablet (5 mg total) by mouth 2 (two) times daily. 10/02/14  Yes Aleksei [name] V, MD  Guaifenesin (MUCINEX MAXIMUM STRENGTH) 1200 MG TB12 Take 1 tablet (1,200 mg total) by mouth every 12 (twelve) hours as needed. 03/12/15  Yes Bennett Scrape V, PA-C  hyoscyamine (LEVBID) 0.375 MG 12 hr tablet TAKE ONE (1) TABLET BY MOUTH EVERY 12 HOURS AS NEEDED 12/11/14  Yes Irene Shipper, MD  hyoscyamine (LEVSIN SL) 0.125 MG SL tablet Use one to two tablets sublingually every 4 hours as needed for pain 09/21/13  Yes Irene Shipper, MD  ipratropium (ATROVENT) 0.03 % nasal spray Place 2 sprays into both nostrils 2 (two) times daily. 03/12/15  Yes Nicole Bush V, PA-C  LORazepam (ATIVAN) 0.5 MG tablet TAKE ONE (1) TABLET BY MOUTH 3 TIMES  DAILY AS NEEDED BETWEEN MEALS AND BEDTIME FOR ANXIETY 02/28/15  Yes Aleksei [name] V, MD  meloxicam (MOBIC) 15 MG tablet Take 0.5-1 tablets (7.5-15 mg total) by mouth daily. 10/02/14  Yes Aleksei [name] V, MD  methocarbamol (ROBAXIN) 500 MG tablet Take 1 tablet (500 mg total) by mouth 4 (four) times daily as needed. 10/02/14  Yes Aleksei [name] V, MD  Methylcellulose, Laxative, (CITRUCEL PO) Take by mouth. One once daily    Yes Historical Provider, MD  Multiple Vitamins-Minerals (MULTIVITAMIN,TX-MINERALS) tablet Take 1 tablet by mouth daily.     Yes Historical Provider, MD  pantoprazole (PROTONIX) 40 MG tablet TAKE ONE (1) TABLET BY MOUTH TWO (2) TIMES DAILY 10/02/14  Yes Aleksei [name] V, MD  Phosphatidylserine-DHA-EPA (VAYACOG) 100-19.5-6.5  MG CAPS Take 1 capsule by mouth daily. 03/21/14  Yes Aleksei [name] V, MD  pregabalin (LYRICA) 150 MG capsule Take 1 capsule (150 mg total) by mouth 2 (two) times daily. 11/17/13  Yes Aleksei [name] V, MD  Sennosides (SENNA LAX PO) Take by mouth. As needed    Yes Historical Provider, MD   Allergies: No Known Allergies  Social History   Social History  . Marital Status: Married    Spouse Name: N/A  . Number of Children: N/A  . Years of Education: N/A   Occupational History  . 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 Retired    Social History Main Topics  . Smoking status: Former Research scientist (life sciences)  . Smokeless tobacco: Never Used  . Alcohol Use: Yes     Comment: once a week   . Drug Use: No  . Sexual Activity: Not on file   Other Topics Concern  . Not on file   Social History Narrative   Taking care of her old mother.          Review of Systems: Constitutional: negative for chills, fever, night sweats, weight changes, or fatigue  HEENT: negative for vision changes, hearing loss epistaxis, or sinus pressure. Positive for congestion, rhinorrhea, and ST Cardiovascular: negative for chest pain or palpitations Respiratory: negative for hemoptysis, wheezing, shortness of breath. Positive for cough. Abdominal: negative for abdominal pain, nausea, vomiting, diarrhea, or constipation Dermatological: negative for rash Neurologic: negative for headache, dizziness, or syncope All other systems reviewed and are otherwise negative with the exception to those above and in the HPI.  Physical Exam: Blood pressure 118/68, pulse 91, temperature 98.4 F (36.9 C), temperature source Oral, resp. rate 18, height 5\' 4"  (1.626 m), weight 160 lb (72.576 kg), SpO2 98 %., Body mass index is 27.45 kg/(m^2). General: Well developed, well nourished, in no acute distress. Head: Normocephalic, atraumatic, eyes without discharge, sclera non-icteric, nares are without discharge. Bilateral  auditory canals clear, TM's are without perforation, pearly grey and translucent with reflective cone of light bilaterally. Oral cavity moist, posterior pharynx without exudate, erythema, peritonsillar abscess, or post nasal drip.  Neck: Supple. No thyromegaly. Full ROM. No lymphadenopathy. Lungs: Breathing is unlabored. Few rhonchi both lungs. Heart: RRR with S1 S2. No murmurs, rubs, or gallops appreciated. Abdomen: Soft, non-tender, non-distended with normoactive bowel sounds. No hepatomegaly. No rebound/guarding. No obvious abdominal masses. Msk:  Strength and tone normal for age. Extremities/Skin: Warm and dry. No clubbing or cyanosis. No edema. No rashes or suspicious lesions. Neuro: Alert and oriented X 3. Moves all extremities spontaneously. Gait is normal. CNII-XII grossly in tact. Psych:  Responds to questions appropriately with a normal affect.  [facility] reading (  PRIMARY) by Dr. Joseph Art: Hazy density in the lingula .  Labs:  ASSESSMENT AND PLAN:  69 y.o. year old female with   This chart was scribed in my presence and reviewed by me personally.    ICD-9-CM ICD-10-CM   1. Acute bronchitis, unspecified organism 466.0 J20.9 DG Chest 2 View     HYDROcodone-homatropine (HYCODAN) 5-1.5 MG/5ML syrup     azithromycin (ZITHROMAX) 250 MG tablet     DISCONTINUED: azithromycin (ZITHROMAX) 250 MG tablet     Signed, Haley Haber, MD  Signed, Haley Haber, MD 03/18/2015 9:04 AM

## 2015-04-02 ENCOUNTER — Ambulatory Visit: Payer: Medicare Other | Admitting: Internal Medicine

## 2015-04-04 ENCOUNTER — Encounter: Payer: Self-pay | Admitting: Cardiology

## 2015-04-11 DIAGNOSIS — Z1231 Encounter for screening mammogram for malignant neoplasm of breast: Secondary | ICD-10-CM | POA: Diagnosis not present

## 2015-04-18 ENCOUNTER — Encounter: Payer: Self-pay | Admitting: Internal Medicine

## 2015-04-18 ENCOUNTER — Other Ambulatory Visit (INDEPENDENT_AMBULATORY_CARE_PROVIDER_SITE_OTHER): Payer: Medicare Other

## 2015-04-18 ENCOUNTER — Other Ambulatory Visit: Payer: Self-pay | Admitting: Internal Medicine

## 2015-04-18 ENCOUNTER — Ambulatory Visit (INDEPENDENT_AMBULATORY_CARE_PROVIDER_SITE_OTHER): Payer: Medicare Other | Admitting: Internal Medicine

## 2015-04-18 VITALS — BP 160/86 | HR 65 | Wt 161.0 lb

## 2015-04-18 DIAGNOSIS — Z23 Encounter for immunization: Secondary | ICD-10-CM | POA: Diagnosis not present

## 2015-04-18 DIAGNOSIS — M542 Cervicalgia: Secondary | ICD-10-CM

## 2015-04-18 DIAGNOSIS — R209 Unspecified disturbances of skin sensation: Secondary | ICD-10-CM

## 2015-04-18 DIAGNOSIS — M5442 Lumbago with sciatica, left side: Secondary | ICD-10-CM

## 2015-04-18 LAB — URINALYSIS, ROUTINE W REFLEX MICROSCOPIC
Bilirubin Urine: NEGATIVE
Hgb urine dipstick: NEGATIVE
Ketones, ur: NEGATIVE
NITRITE: NEGATIVE
RBC / HPF: NONE SEEN (ref 0–?)
SPECIFIC GRAVITY, URINE: 1.01 (ref 1.000–1.030)
Total Protein, Urine: NEGATIVE
Urine Glucose: NEGATIVE
Urobilinogen, UA: 0.2 (ref 0.0–1.0)
pH: 7.5 (ref 5.0–8.0)

## 2015-04-18 LAB — BASIC METABOLIC PANEL
BUN: 21 mg/dL (ref 6–23)
CALCIUM: 9.7 mg/dL (ref 8.4–10.5)
CO2: 31 mEq/L (ref 19–32)
CREATININE: 0.9 mg/dL (ref 0.40–1.20)
Chloride: 105 mEq/L (ref 96–112)
GFR: 66.1 mL/min (ref >60.00)
Glucose, Bld: 106 mg/dL — ABNORMAL HIGH (ref 70–99)
Potassium: 5.1 mEq/L (ref 3.5–5.1)
Sodium: 141 mEq/L (ref 135–145)

## 2015-04-18 LAB — HEPATIC FUNCTION PANEL
ALT: 23 U/L (ref 0–35)
AST: 26 U/L (ref 0–37)
Albumin: 4.1 g/dL (ref 3.5–5.2)
Alkaline Phosphatase: 60 U/L (ref 39–117)
BILIRUBIN DIRECT: 0.1 mg/dL (ref 0.0–0.3)
BILIRUBIN TOTAL: 0.4 mg/dL (ref 0.2–1.2)
Total Protein: 7.1 g/dL (ref 6.0–8.3)

## 2015-04-18 LAB — CBC WITH DIFFERENTIAL/PLATELET
BASOS ABS: 0.1 10*3/uL (ref 0.0–0.1)
Basophils Relative: 0.8 % (ref 0.0–3.0)
EOS PCT: 3.3 % (ref 0.0–5.0)
Eosinophils Absolute: 0.2 10*3/uL (ref 0.0–0.7)
HCT: 40.3 % (ref 36.0–46.0)
Hemoglobin: 13.4 g/dL (ref 12.0–15.0)
LYMPHS ABS: 2 10*3/uL (ref 0.7–4.0)
Lymphocytes Relative: 31.9 % (ref 12.0–46.0)
MCHC: 33.3 g/dL (ref 30.0–36.0)
MCV: 94.2 fl (ref 78.0–100.0)
MONOS PCT: 9.2 % (ref 3.0–12.0)
Monocytes Absolute: 0.6 10*3/uL (ref 0.1–1.0)
NEUTROS ABS: 3.4 10*3/uL (ref 1.4–7.7)
NEUTROS PCT: 54.8 % (ref 43.0–77.0)
PLATELETS: 235 10*3/uL (ref 150.0–400.0)
RBC: 4.28 Mil/uL (ref 3.87–5.11)
RDW: 14.1 % (ref 11.5–15.5)
WBC: 6.1 10*3/uL (ref 4.0–10.5)

## 2015-04-18 LAB — VITAMIN B12: Vitamin B-12: 1279 pg/mL — ABNORMAL HIGH (ref 211–911)

## 2015-04-18 LAB — SEDIMENTATION RATE: SED RATE: 14 mm/h (ref 0–22)

## 2015-04-18 NOTE — Assessment & Plan Note (Addendum)
10/16 R>L side ?etiology MSK vs other UA,CBC Abd [place]  Spinal stenosis Dr Vertell Limber On [place]

## 2015-04-18 NOTE — Assessment & Plan Note (Signed)
Chronic  Dr Vertell Limber On [place] Better

## 2015-04-18 NOTE — Progress Notes (Signed)
Subjective:  Patient ID: Haley Armstrong, female    DOB: 05-Aug-1945  Age: 69 y.o. MRN: [id number]  CC: No chief complaint on file.   HPI Haley Armstrong presents for L hand and ulnar aspect of forearm - "electric current" sensation down to fingers #4-5 C/o L flank pain at times. H/o lymphoma in the family. No fever. Chronic sweats  Outpatient Prescriptions Prior to Visit  Medication Sig Dispense Refill  . AMBULATORY NON FORMULARY MEDICATION Medication Name: Mylanta and Lidocaine   Take 30-60 cc by mouth twice a day as needed 30 application 0  . amLODipine (NORVASC) 5 MG tablet TAKE ONE (1/2) TABLET BY MOUTH EVERY DAY 45 tablet 3  . APAP-Isometheptene-Dichloral (EPIDRIN) 325-65-100 MG CAPS Take by mouth 2 (two) times daily as needed.      Marland Kitchen aspirin 81 MG EC tablet Take 81 mg by mouth 2 (two) times daily.      Marland Kitchen b complex vitamins tablet Take 1 tablet by mouth daily.      . Cholecalciferol 1000 UNITS tablet Take 1,000 Units by mouth daily.      . Diclofenac Sodium (PENNSAID) 1.5 % SOLN APPLY 3 TO 5 DROPS ON SKIN THREE TIMES A DAY FOR PAIN 150 mL 2  . enalapril (VASOTEC) 5 MG tablet Take 1 tablet (5 mg total) by mouth 2 (two) times daily. 180 tablet 3  . hyoscyamine (LEVBID) 0.375 MG 12 hr tablet TAKE ONE (1) TABLET BY MOUTH EVERY 12 HOURS AS NEEDED 60 tablet 3  . LORazepam (ATIVAN) 0.5 MG tablet TAKE ONE (1) TABLET BY MOUTH 3 TIMES DAILY AS NEEDED BETWEEN MEALS AND BEDTIME FOR ANXIETY 30 tablet 5  . meloxicam (MOBIC) 15 MG tablet Take 0.5-1 tablets (7.5-15 mg total) by mouth daily. 90 tablet 3  . methocarbamol (ROBAXIN) 500 MG tablet Take 1 tablet (500 mg total) by mouth 4 (four) times daily as needed. 120 tablet 2  . Methylcellulose, Laxative, (CITRUCEL PO) Take by mouth. One once daily     . Multiple Vitamins-Minerals (MULTIVITAMIN,TX-MINERALS) tablet Take 1 tablet by mouth daily.      . pantoprazole (PROTONIX) 40 MG tablet TAKE ONE (1) TABLET BY MOUTH TWO (2) TIMES DAILY 180 tablet 3    . Phosphatidylserine-DHA-EPA (VAYACOG) 100-19.5-6.5 MG CAPS Take 1 capsule by mouth daily. 30 capsule 11  . pregabalin (LYRICA) 150 MG capsule Take 1 capsule (150 mg total) by mouth 2 (two) times daily. 60 capsule 3  . Sennosides (SENNA LAX PO) Take by mouth. As needed     . azithromycin (ZITHROMAX) 250 MG tablet Take 2 tabs PO x 1 dose, then 1 tab PO QD x 4 days 6 tablet 0  . chlorpheniramine-HYDROcodone (TUSSIONEX PENNKINETIC ER) 10-8 MG/5ML SUER Take 5 mLs by mouth every 12 (twelve) hours as needed for cough. 80 mL 0  . Guaifenesin (MUCINEX MAXIMUM STRENGTH) 1200 MG TB12 Take 1 tablet (1,200 mg total) by mouth every 12 (twelve) hours as needed. 14 tablet 1  . HYDROcodone-homatropine (HYCODAN) 5-1.5 MG/5ML syrup Take 5 mLs by mouth every 8 (eight) hours as needed for cough. 120 mL 0  . hyoscyamine (LEVSIN SL) 0.125 MG SL tablet Use one to two tablets sublingually every 4 hours as needed for pain 30 tablet 0  . ipratropium (ATROVENT) 0.03 % nasal spray Place 2 sprays into both nostrils 2 (two) times daily. (Patient not taking: Reported on 04/18/2015) 30 mL 0   No facility-administered medications prior to visit.    ROS Review  of Systems  Objective:  BP 160/86 mmHg  Pulse 65  Wt 161 lb (73.029 kg)  SpO2 99%  BP Readings from Last 3 Encounters:  04/18/15 160/86  03/18/15 118/68  03/12/15 148/70    Wt Readings from Last 3 Encounters:  04/18/15 161 lb (73.029 kg)  03/18/15 160 lb (72.576 kg)  03/12/15 162 lb (73.483 kg)    Physical Exam  Lab Results  Component Value Date   WBC 6.1 04/18/2015   HGB 13.4 04/18/2015   HCT 40.3 04/18/2015   PLT 235.0 04/18/2015   GLUCOSE 106* 04/18/2015   CHOL 187 04/25/2014   TRIG 55.0 04/25/2014   HDL 57.70 04/25/2014   LDLCALC 118* 04/25/2014   ALT 23 04/18/2015   AST 26 04/18/2015   NA 141 04/18/2015   K 5.1 04/18/2015   CL 105 04/18/2015   CREATININE 0.90 04/18/2015   BUN 21 04/18/2015   CO2 31 04/18/2015   TSH 2.87 04/25/2014    INR 0.90 08/05/2009   HGBA1C 5.7 05/20/2007    No results found.  Assessment & Plan:   Diagnoses and all orders for this visit:  Left-sided low back pain with sciatica, sciatica laterality unspecified, unspecified chronicity -     CBC with Differential/Platelet; Future -     Basic metabolic panel; Future -     Hepatic function panel; Future -     Vitamin B12; Future -     Urinalysis; Future -     Sedimentation rate; Future -     US Abdomen Complete  PARESTHESIA -     CBC with Differential/Platelet; Future -     Basic metabolic panel; Future -     Hepatic function panel; Future -     Vitamin B12; Future -     Urinalysis; Future -     Sedimentation rate; Future  Cervicalgia  Need for influenza vaccination -     Flu Vaccine QUAD 36+ mos IM  Need for prophylactic vaccination against Streptococcus pneumoniae (pneumococcus) -     Pneumococcal conjugate vaccine 13-valent IM  I have discontinued [name]'s chlorpheniramine-HYDROcodone, Guaifenesin, HYDROcodone-homatropine, and azithromycin. I am also having her maintain her aspirin, b complex vitamins, EPIDRIN, (multivitamin,tx-minerals), Cholecalciferol, (Methylcellulose, Laxative, (CITRUCEL PO)), Sennosides (SENNA LAX PO), Diclofenac Sodium, pregabalin, AMBULATORY NON FORMULARY MEDICATION, Phosphatidylserine-DHA-EPA, amLODipine, enalapril, pantoprazole, meloxicam, methocarbamol, hyoscyamine, LORazepam, and ipratropium.  No orders of the defined types were placed in this encounter.     Follow-up: Return in about 4 weeks (around 05/16/2015) for a follow-up visit.  Walker Kehr, MD

## 2015-04-18 NOTE — Assessment & Plan Note (Addendum)
L ulnar neuropathy - poss work related Labs Neurol ref offered

## 2015-04-18 NOTE — Progress Notes (Signed)
Pre visit review using our clinic review tool, if applicable. No additional management support is needed unless otherwise documented below in the visit note. 

## 2015-05-09 ENCOUNTER — Ambulatory Visit
Admission: RE | Admit: 2015-05-09 | Discharge: 2015-05-09 | Disposition: A | Discharge status: Home / Self Care | Payer: Medicare Other | Source: Ambulatory Visit | Attending: Internal Medicine | Admitting: Internal Medicine

## 2015-05-09 DIAGNOSIS — R109 Unspecified abdominal pain: Secondary | ICD-10-CM | POA: Diagnosis not present

## 2015-05-16 ENCOUNTER — Ambulatory Visit (INDEPENDENT_AMBULATORY_CARE_PROVIDER_SITE_OTHER): Payer: Medicare Other | Admitting: Podiatry

## 2015-05-16 ENCOUNTER — Encounter: Payer: Self-pay | Admitting: Podiatry

## 2015-05-16 ENCOUNTER — Ambulatory Visit (INDEPENDENT_AMBULATORY_CARE_PROVIDER_SITE_OTHER): Payer: Medicare Other

## 2015-05-16 ENCOUNTER — Telehealth: Payer: Self-pay | Admitting: Internal Medicine

## 2015-05-16 ENCOUNTER — Ambulatory Visit (INDEPENDENT_AMBULATORY_CARE_PROVIDER_SITE_OTHER): Payer: Medicare Other | Admitting: Cardiology

## 2015-05-16 ENCOUNTER — Encounter: Payer: Self-pay | Admitting: Cardiology

## 2015-05-16 VITALS — BP 121/67 | HR 86 | Resp 12

## 2015-05-16 VITALS — BP 130/70 | HR 73 | Ht 64.0 in | Wt 161.2 lb

## 2015-05-16 DIAGNOSIS — I10 Essential (primary) hypertension: Secondary | ICD-10-CM

## 2015-05-16 DIAGNOSIS — M205X9 Other deformities of toe(s) (acquired), unspecified foot: Secondary | ICD-10-CM | POA: Diagnosis not present

## 2015-05-16 DIAGNOSIS — M21619 Bunion of unspecified foot: Secondary | ICD-10-CM

## 2015-05-16 DIAGNOSIS — R079 Chest pain, unspecified: Secondary | ICD-10-CM | POA: Diagnosis not present

## 2015-05-16 DIAGNOSIS — K219 Gastro-esophageal reflux disease without esophagitis: Secondary | ICD-10-CM

## 2015-05-16 DIAGNOSIS — M779 Enthesopathy, unspecified: Secondary | ICD-10-CM | POA: Diagnosis not present

## 2015-05-16 MED ORDER — TRIAMCINOLONE ACETONIDE 10 MG/ML IJ SUSP
10.0000 mg | Freq: Once | INTRAMUSCULAR | Status: AC
  Administered 2015-05-16: 10 mg

## 2015-05-16 NOTE — Telephone Encounter (Signed)
Pt called to reschedule her appt for a follow up on her results from her ultrasound.  Dr. Camila Li wrote on there that her ultra sound was good.  She is wondering if she still needs to come in for this appointment. Please advise

## 2015-05-16 NOTE — Progress Notes (Signed)
Cardiology Office Note   Date:  05/16/2015   ID:  Haley Armstrong, DOB 1945-09-21, MRN [id number]  PCP:  Walker Kehr, MD    Chief Complaint  Patient presents with  . Chest Pain      History of Present Illness: Haley Armstrong is a 69 y.o. female who presents for establishment of Cardiologist.  She has a history of HTN, GERD with esophageal spasm and strong family history of coronary disease.  She has chronic episodic chest tightness that she has associated with GERD and esophageal spasm.  She describes the pain as tightness that radiates into her neck and sides of her face.  She has been getting worried that some of the discomfort could be related to her heart given the family history of CAD.  Sometimes the pain will radiate into her left arm.  She notices sometimes when walking that she will get tightness in her shoulders.  She has some DOE when going up a lot of steps.  She denies any nausea or diaphoresis with the chest discomfort. She denies any palpitations, dizziness or syncope.     Past Medical History  Diagnosis Date  . GERD (gastroesophageal reflux disease)   . Osteoarthritis   . Anemia, iron deficiency   . Hypertension   . Blood donor   . Anxiety   . IBS (irritable bowel syndrome)     Past Surgical History  Procedure Laterality Date  . Appendectomy    . Cholecystectomy    . Cervical fusion  2011    C4-5 Dr Marcial Pacas  . Esophageal manometry N/A 11/13/2013    Procedure: ESOPHAGEAL MANOMETRY (EM);  Surgeon: Irene Shipper, MD;  Location: [facility];  Service: Endoscopy;  Laterality: N/A;     Current Outpatient Prescriptions  Medication Sig Dispense Refill  . AMBULATORY NON FORMULARY MEDICATION Medication Name: Mylanta and Lidocaine   Take 30-60 cc by mouth twice a day as needed 30 application 0  . amLODipine (NORVASC) 5 MG tablet TAKE ONE (1/2) TABLET BY MOUTH EVERY DAY 45 tablet 3  . APAP-Isometheptene-Dichloral (EPIDRIN) 325-65-100 MG CAPS  Take by mouth 2 (two) times daily as needed.      Marland Kitchen aspirin 81 MG EC tablet Take 81 mg by mouth 2 (two) times daily.      Marland Kitchen b complex vitamins tablet Take 1 tablet by mouth daily.      . Cholecalciferol 1000 UNITS tablet Take 1,000 Units by mouth daily.      . Diclofenac Sodium (PENNSAID) 1.5 % SOLN APPLY 3 TO 5 DROPS ON SKIN THREE TIMES A DAY FOR PAIN 150 mL 2  . enalapril (VASOTEC) 5 MG tablet Take 1 tablet (5 mg total) by mouth 2 (two) times daily. 180 tablet 3  . hyoscyamine (LEVBID) 0.375 MG 12 hr tablet TAKE ONE (1) TABLET BY MOUTH EVERY 12 HOURS AS NEEDED 60 tablet 3  . hyoscyamine (LEVSIN SL) 0.125 MG SL tablet USE ONE TO TWO TABLETS SUBLINGUALLY EVERY 4 HOURS AS NEEDED FOR PAIN 30 tablet 3  . LORazepam (ATIVAN) 0.5 MG tablet TAKE ONE (1) TABLET BY MOUTH 3 TIMES DAILY AS NEEDED BETWEEN MEALS AND BEDTIME FOR ANXIETY 30 tablet 5  . meloxicam (MOBIC) 15 MG tablet Take 0.5-1 tablets (7.5-15 mg total) by mouth daily. 90 tablet 3  . methocarbamol (ROBAXIN) 500 MG tablet Take 1 tablet (500 mg total) by mouth 4 (four) times  daily as needed. 120 tablet 2  . Methylcellulose, Laxative, (CITRUCEL PO) Take by mouth. One once daily     . Multiple Vitamins-Minerals (MULTIVITAMIN,TX-MINERALS) tablet Take 1 tablet by mouth daily.      . pantoprazole (PROTONIX) 40 MG tablet TAKE ONE (1) TABLET BY MOUTH TWO (2) TIMES DAILY 180 tablet 3  . Phosphatidylserine-DHA-EPA (VAYACOG) 100-19.5-6.5 MG CAPS Take 1 capsule by mouth daily. 30 capsule 11  . pregabalin (LYRICA) 150 MG capsule Take 1 capsule (150 mg total) by mouth 2 (two) times daily. 60 capsule 3  . Sennosides (SENNA LAX PO) Take by mouth. As needed      No current facility-administered medications for this visit.    Allergies:   Review of patient's allergies indicates no known allergies.    Social History:  The patient  reports that she has quit smoking. She has never used smokeless tobacco. She reports that she drinks alcohol. She reports that she  does not use illicit drugs.   Family History:  The patient's family history includes Cancer (age of onset: 102) in her mother; Dementia in her mother; Heart attack in her brother and maternal aunt; Heart disease in her brother and maternal aunt; Lymphoma in her mother; Parkinsonism in her father. There is no history of Colon cancer.    ROS:  Please see the history of present illness.   Otherwise, review of systems are positive for none.   All other systems are reviewed and negative.    PHYSICAL EXAM: VS:  BP 130/70 mmHg  Pulse 73  Ht 5\' 4"  (1.626 m)  Wt 73.12 kg (161 lb 3.2 oz)  BMI 27.66 kg/m2 , BMI Body mass index is 27.66 kg/(m^2). GEN: Well nourished, well developed, in no acute distress HEENT: normal Neck: no JVD, carotid bruits, or masses Cardiac: RRR; no murmurs, rubs, or gallops,no edema  Respiratory:  clear to auscultation bilaterally, normal work of breathing GI: soft, nontender, nondistended, + BS MS: no deformity or atrophy Skin: warm and dry, no rash Neuro:  Strength and sensation are intact Psych: euthymic mood, full affect   EKG:  EKG is ordered today. The ekg ordered today demonstrates NSR with no ST changes   Recent Labs: 04/18/2015: ALT 23; BUN 21; Creatinine, Ser 0.90; Hemoglobin 13.4; Platelets 235.0; Potassium 5.1; Sodium 141    Lipid Panel    Component Value Date/Time   CHOL 187 04/25/2014 1138   TRIG 55.0 04/25/2014 1138   HDL 57.70 04/25/2014 1138   CHOLHDL 3 04/25/2014 1138   VLDL 11.0 04/25/2014 1138   LDLCALC 118* 04/25/2014 1138      Wt Readings from Last 3 Encounters:  05/16/15 73.12 kg (161 lb 3.2 oz)  04/18/15 73.029 kg (161 lb)  03/18/15 72.576 kg (160 lb)       ASSESSMENT AND PLAN:  1.  Chest pain with typical and atypical features. She has significant problems with GERD and esophageal spasm so this could be GI related.  The EKG is nonischemic.  She has a strong family history of CAD and remote history of tobacco use.  She also  has HTN.  I will get a Stress myoview to rule out ischemia. I will also get a 2D echo to assess LVF. 2.  HTN - controlled on medical regimen. 3.  GERD with esophageal spasm   Current medicines are reviewed at length with the patient today.  The patient does not have concerns regarding medicines.  The following changes have been made:  no change  Labs/ tests ordered today: See above Assessment and Plan No orders of the defined types were placed in this encounter.     Disposition:   FU with me in 1 year  Signed, Sueanne Margarita, MD  05/16/2015 11:51 AM    [facility] [address], [place], [place]  [address] Phone: [phone]; Fax: [phone]

## 2015-05-16 NOTE — Patient Instructions (Signed)
Medication Instructions:  Your physician recommends that you continue on your current medications as directed. Please refer to the Current Medication list given to you today.   Labwork: None  Testing/Procedures: Your physician has requested that you have an echocardiogram. Echocardiography is a painless test that uses sound waves to create images of your heart. It provides your doctor with information about the size and shape of your heart and how well your heart's chambers and valves are working. This procedure takes approximately one hour. There are no restrictions for this procedure.   Dr. Radford Pax recommends you have a NUCLEAR STRESS TEST.  Follow-Up: Your physician wants you to follow-up in: 1 year with Dr. Radford Pax. You will receive a reminder letter in the mail two months in advance. If you don't receive a letter, please call our office to schedule the follow-up appointment.   Any Other Special Instructions Will Be Listed Below (If Applicable).     If you need a refill on your cardiac medications before your next appointment, please call your pharmacy.

## 2015-05-16 NOTE — Progress Notes (Signed)
   Subjective:    Patient ID: Haley Armstrong, female    DOB: 1946/06/12, 69 y.o.   MRN: [id number]  HPI RT FOOT BALL OF THE FOOT IS PAINFUL FOR LONG TERM. FOOT IS GETTING WORSE ESPECIALLY WHEN SITTING/STANDING-NO TREATMENT.  ALSO, LT FOOT IS PAINFUL ON THE GREAT TOE JOINT AND HAVE HISTORY OF ARTHRITIS.   Review of Systems  Eyes: Positive for redness.  Gastrointestinal: Positive for constipation.  Musculoskeletal: Positive for myalgias.  Allergic/Immunologic: Positive for environmental allergies.       Objective:   Physical Exam        Assessment & Plan:

## 2015-05-17 ENCOUNTER — Ambulatory Visit: Payer: Medicare Other | Admitting: Internal Medicine

## 2015-05-18 NOTE — Progress Notes (Signed)
Subjective:     Patient ID: Haley Armstrong, female   DOB: Sep 26, 1945, 69 y.o.   MRN: [id number]  HPI patient states the ball of my right foot has been bother me for about a year and I do have a structural bunion on the left over right foot that become occasionally discomforting especially with tighter-type shoes for a long period of time   Review of Systems  All other systems reviewed and are negative.      Objective:   Physical Exam  Constitutional: She is oriented to person, place, and time.  Cardiovascular: Intact distal pulses.   Musculoskeletal: Normal range of motion.  Neurological: She is oriented to person, place, and time.  Skin: Skin is warm.  Nursing note and vitals reviewed.  neurovascular status intact muscle strength adequate range of motion within normal limits. Patient's noted to have good digital perfusion is well oriented 3 with moderate depression of the arch. On the right foot I noted inflammation and fluid around the second MPJ without digital dislocation and there is hyperostosis medial aspect first metatarsal head left over right with mild redness noted     Assessment:     Inflammatory capsulitis second MPJ right with moderate structural bunion deformity left over right and depression of the arch    Plan:     H&P and x-rays of conditions reviewed with patient. Today I went ahead and I'm going to focus on the right foot and I did do a proximal nerve block explained the risk of injection and I aspirated the second MPJ getting out of small amount of clear fluid and injected with a quarter cc dexamethasone Kenalog and applied thick plantar pad. I then discussed structural bunion deformity and treatments available

## 2015-05-20 NOTE — Telephone Encounter (Signed)
Ok to move appt to Jan 2017 if ok Thx

## 2015-05-22 NOTE — Telephone Encounter (Signed)
Informed pt on vm °

## 2015-05-24 ENCOUNTER — Ambulatory Visit: Payer: Medicare Other | Admitting: Cardiology

## 2015-05-28 ENCOUNTER — Encounter: Payer: Self-pay | Admitting: Cardiovascular Disease

## 2015-05-30 ENCOUNTER — Ambulatory Visit: Payer: Medicare Other | Admitting: Internal Medicine

## 2015-06-04 ENCOUNTER — Telehealth (HOSPITAL_COMMUNITY): Payer: Self-pay

## 2015-06-04 NOTE — Telephone Encounter (Signed)
Patient given detailed instructions per Myocardial Perfusion Study Information Sheet for the test on 06-06-2015 at 0945. Patient notified to arrive at [facility] for Echo and that it is imperative to arrive on time for appointment to keep from having the test rescheduled.  If you need to cancel or reschedule your appointment, please call the office within 24 hours of your appointment. Failure to do so may result in a cancellation of your appointment, and a $50 no show fee. Patient verbalized understanding.Odis Hollingshead, RN.

## 2015-06-06 ENCOUNTER — Ambulatory Visit (HOSPITAL_COMMUNITY): Payer: Medicare Other | Attending: Cardiology

## 2015-06-06 ENCOUNTER — Ambulatory Visit (HOSPITAL_BASED_OUTPATIENT_CLINIC_OR_DEPARTMENT_OTHER): Payer: Medicare Other

## 2015-06-06 ENCOUNTER — Other Ambulatory Visit: Payer: Self-pay

## 2015-06-06 DIAGNOSIS — R0609 Other forms of dyspnea: Secondary | ICD-10-CM | POA: Insufficient documentation

## 2015-06-06 DIAGNOSIS — Z8249 Family history of ischemic heart disease and other diseases of the circulatory system: Secondary | ICD-10-CM | POA: Insufficient documentation

## 2015-06-06 DIAGNOSIS — I340 Nonrheumatic mitral (valve) insufficiency: Secondary | ICD-10-CM | POA: Insufficient documentation

## 2015-06-06 DIAGNOSIS — R9439 Abnormal result of other cardiovascular function study: Secondary | ICD-10-CM | POA: Diagnosis not present

## 2015-06-06 DIAGNOSIS — I071 Rheumatic tricuspid insufficiency: Secondary | ICD-10-CM | POA: Diagnosis not present

## 2015-06-06 DIAGNOSIS — Z87891 Personal history of nicotine dependence: Secondary | ICD-10-CM | POA: Diagnosis not present

## 2015-06-06 DIAGNOSIS — I10 Essential (primary) hypertension: Secondary | ICD-10-CM | POA: Insufficient documentation

## 2015-06-06 DIAGNOSIS — I351 Nonrheumatic aortic (valve) insufficiency: Secondary | ICD-10-CM | POA: Insufficient documentation

## 2015-06-06 DIAGNOSIS — R079 Chest pain, unspecified: Secondary | ICD-10-CM

## 2015-06-06 LAB — MYOCARDIAL PERFUSION IMAGING
CHL CUP NUCLEAR SRS: 5
CHL CUP NUCLEAR SSS: 7
CHL CUP RESTING HR STRESS: 65 {beats}/min
CSEPED: 8 min
CSEPEDS: 0 s
Estimated workload: 10.1 METS
LV dias vol: 53 mL
LVSYSVOL: 11 mL
MPHR: 152 {beats}/min
NUC STRESS TID: 0.85
Peak HR: 136 {beats}/min
Percent HR: 89 %
RATE: 0.37
RPE: 18
SDS: 2

## 2015-06-06 MED ORDER — TECHNETIUM TC 99M SESTAMIBI GENERIC - CARDIOLITE
30.5000 | Freq: Once | INTRAVENOUS | Status: AC | PRN
  Administered 2015-06-06: 31 via INTRAVENOUS

## 2015-06-06 MED ORDER — TECHNETIUM TC 99M SESTAMIBI GENERIC - CARDIOLITE
10.6000 | Freq: Once | INTRAVENOUS | Status: AC | PRN
  Administered 2015-06-06: 11 via INTRAVENOUS

## 2015-06-07 ENCOUNTER — Telehealth: Payer: Self-pay | Admitting: Cardiology

## 2015-06-07 DIAGNOSIS — R079 Chest pain, unspecified: Secondary | ICD-10-CM

## 2015-06-07 NOTE — Telephone Encounter (Signed)
-----   Message from Sueanne Margarita, MD sent at 06/06/2015 10:44 PM EST ----- Perfusion images reviewed and there is a fixed inferoseptal defect with no ischemia - normal LVF with normal wall motion.  Low risk study.  GIven her family history and symptoms please get a coronary calcium score

## 2015-06-07 NOTE — Telephone Encounter (Signed)
Informed patient of results and verbal understanding expressed.   Ca score ordered for scheduling. Patient agrees with treatment plan.

## 2015-06-07 NOTE — Telephone Encounter (Signed)
New Message   Pt is calling to return rn call

## 2015-06-17 DIAGNOSIS — M545 Low back pain: Secondary | ICD-10-CM | POA: Diagnosis not present

## 2015-06-17 DIAGNOSIS — M542 Cervicalgia: Secondary | ICD-10-CM | POA: Diagnosis not present

## 2015-06-17 DIAGNOSIS — M5412 Radiculopathy, cervical region: Secondary | ICD-10-CM | POA: Diagnosis not present

## 2015-06-17 DIAGNOSIS — M5416 Radiculopathy, lumbar region: Secondary | ICD-10-CM | POA: Diagnosis not present

## 2015-06-27 ENCOUNTER — Ambulatory Visit (INDEPENDENT_AMBULATORY_CARE_PROVIDER_SITE_OTHER)
Admission: RE | Admit: 2015-06-27 | Discharge: 2015-06-27 | Disposition: A | Discharge status: Home / Self Care | Payer: Self-pay | Source: Ambulatory Visit | Attending: Cardiology | Admitting: Cardiology

## 2015-06-27 DIAGNOSIS — R079 Chest pain, unspecified: Secondary | ICD-10-CM

## 2015-07-04 DIAGNOSIS — M5412 Radiculopathy, cervical region: Secondary | ICD-10-CM | POA: Diagnosis not present

## 2015-07-04 DIAGNOSIS — M47812 Spondylosis without myelopathy or radiculopathy, cervical region: Secondary | ICD-10-CM | POA: Diagnosis not present

## 2015-07-29 DIAGNOSIS — M542 Cervicalgia: Secondary | ICD-10-CM | POA: Diagnosis not present

## 2015-07-29 DIAGNOSIS — I10 Essential (primary) hypertension: Secondary | ICD-10-CM | POA: Diagnosis not present

## 2015-07-29 DIAGNOSIS — M5412 Radiculopathy, cervical region: Secondary | ICD-10-CM | POA: Diagnosis not present

## 2015-09-03 ENCOUNTER — Encounter: Payer: Self-pay | Admitting: Internal Medicine

## 2015-10-07 ENCOUNTER — Other Ambulatory Visit: Payer: Self-pay | Admitting: Internal Medicine

## 2015-11-14 ENCOUNTER — Other Ambulatory Visit: Payer: Self-pay | Admitting: Internal Medicine

## 2015-12-10 ENCOUNTER — Other Ambulatory Visit: Payer: Self-pay | Admitting: Internal Medicine

## 2015-12-11 ENCOUNTER — Encounter: Payer: Self-pay | Admitting: Internal Medicine

## 2015-12-30 ENCOUNTER — Other Ambulatory Visit: Payer: Self-pay | Admitting: Internal Medicine

## 2016-01-06 ENCOUNTER — Telehealth: Payer: Self-pay | Admitting: *Deleted

## 2016-01-06 NOTE — Telephone Encounter (Addendum)
Pt states she had pain in the bunion, but doesn't want to take Mobic any longer, would like rx for diclofenac gel. Pt LOV 04/2015. 01/08/2016-Dr. Regal ordered Diclofenac gel 1% +11 refills. Orders to pt.

## 2016-01-08 MED ORDER — DICLOFENAC SODIUM 1 % TD GEL: 4.0000 g | Freq: Four times a day (QID) | TRANSDERMAL | Status: AC

## 2016-02-07 ENCOUNTER — Ambulatory Visit (INDEPENDENT_AMBULATORY_CARE_PROVIDER_SITE_OTHER): Payer: Medicare Other | Admitting: Internal Medicine

## 2016-02-07 ENCOUNTER — Encounter: Payer: Self-pay | Admitting: Internal Medicine

## 2016-02-07 VITALS — BP 140/80 | HR 75 | Temp 98.0°F | Resp 20 | Wt 157.0 lb

## 2016-02-07 DIAGNOSIS — L03119 Cellulitis of unspecified part of limb: Secondary | ICD-10-CM | POA: Insufficient documentation

## 2016-02-07 DIAGNOSIS — R7309 Other abnormal glucose: Secondary | ICD-10-CM | POA: Diagnosis not present

## 2016-02-07 DIAGNOSIS — I10 Essential (primary) hypertension: Secondary | ICD-10-CM | POA: Diagnosis not present

## 2016-02-07 MED ORDER — CEPHALEXIN 500 MG PO CAPS
500.0000 mg | ORAL_CAPSULE | Freq: Four times a day (QID) | ORAL | 0 refills | Status: DC
Start: 2016-02-07 — End: 2016-02-20

## 2016-02-07 NOTE — Progress Notes (Signed)
Subjective:    Patient ID: Haley Armstrong, female    DOB: 09-Jan-1946, 70 y.o.   MRN: [id number]  HPI  Here with 2 days onset left foot red/swelling/tender after an insect bite (not sure which) bit instep left medial foot, no f/c, Pt denies chest pain, increased sob or doe, wheezing, orthopnea, PND, increased LE swelling, palpitations, dizziness or syncope.  Pt denies new neurological symptoms such as new headache, or facial or extremity weakness or numbness   Pt denies polydipsia, polyuria  Pt denies fever, wt loss, night sweats, loss of appetite, or other constitutional symptoms Past Medical History:  Diagnosis Date  . Anemia, iron deficiency   . Anxiety   . Blood donor   . GERD (gastroesophageal reflux disease)   . Hypertension   . IBS (irritable bowel syndrome)   . Osteoarthritis    Past Surgical History:  Procedure Laterality Date  . APPENDECTOMY    . CERVICAL FUSION  2011   C4-5 Dr Marcial Pacas  . CHOLECYSTECTOMY    . ESOPHAGEAL MANOMETRY N/A 11/13/2013   Procedure: ESOPHAGEAL MANOMETRY (EM);  Surgeon: Irene Shipper, MD;  Location: [facility];  Service: Endoscopy;  Laterality: N/A;    reports that she has quit smoking. She has never used smokeless tobacco. She reports that she drinks alcohol. She reports that she does not use drugs. family history includes Dementia in her mother; Heart attack in her brother and maternal aunt; Heart disease in her brother and maternal aunt; Lymphoma in her mother; Parkinsonism in her father; Uterine cancer (age of onset: 110) in her mother. No Known Allergies Current Outpatient Prescriptions on File Prior to Visit  Medication Sig Dispense Refill  . AMBULATORY NON FORMULARY MEDICATION Medication Name: Mylanta and Lidocaine   Take 30-60 cc by mouth twice a day as needed 30 application 0  . amLODipine (NORVASC) 5 MG tablet Take 0.5 tablets (2.5 mg total) by mouth daily. Keep Oct appt for future refills 45 tablet 0  . APAP-Isometheptene-Dichloral (EPIDRIN)  325-65-100 MG CAPS Take by mouth 2 (two) times daily as needed.      Marland Kitchen aspirin 81 MG EC tablet Take 81 mg by mouth 2 (two) times daily.      Marland Kitchen b complex vitamins tablet Take 1 tablet by mouth daily.      . Cholecalciferol 1000 UNITS tablet Take 1,000 Units by mouth daily.      . Diclofenac Sodium (PENNSAID) 1.5 % SOLN APPLY 3 TO 5 DROPS ON SKIN THREE TIMES A DAY FOR PAIN 150 mL 2  . diclofenac sodium (VOLTAREN) 1 % GEL Apply 4 g topically 4 (four) times daily. 100 Tube 11  . enalapril (VASOTEC) 5 MG tablet TAKE ONE (1) TABLET BY MOUTH TWO (2) TIMES DAILY 180 tablet 1  . hyoscyamine (LEVBID) 0.375 MG 12 hr tablet TAKE ONE (1) TABLET BY MOUTH EVERY 12 HOURS AS NEEDED 60 tablet 0  . hyoscyamine (LEVSIN SL) 0.125 MG SL tablet USE ONE TO TWO TABLETS SUBLINGUALLY EVERY 4 HOURS AS NEEDED FOR PAIN 30 tablet 3  . LORazepam (ATIVAN) 0.5 MG tablet TAKE ONE (1) TABLET BY MOUTH 3 TIMES DAILY AS NEEDED BETWEEN MEALS AND BEDTIME FOR ANXIETY 30 tablet 5  . meloxicam (MOBIC) 15 MG tablet TAKE 1/2 TO 1 TABLET BY MOUTH DAILY 90 tablet 1  . methocarbamol (ROBAXIN) 500 MG tablet Take 1 tablet (500 mg total) by mouth 4 (four) times daily as needed. 120 tablet 2  . Methylcellulose, Laxative, (CITRUCEL PO) Take  by mouth. One once daily     . Multiple Vitamins-Minerals (MULTIVITAMIN,TX-MINERALS) tablet Take 1 tablet by mouth daily.      . pantoprazole (PROTONIX) 40 MG tablet TAKE ONE (1) TABLET BY MOUTH TWO (2) TIMES DAILY 180 tablet 3  . Phosphatidylserine-DHA-EPA (VAYACOG) 100-19.5-6.5 MG CAPS Take 1 capsule by mouth daily. 30 capsule 11  . pregabalin (LYRICA) 150 MG capsule Take 1 capsule (150 mg total) by mouth 2 (two) times daily. 60 capsule 3  . Sennosides (SENNA LAX PO) Take by mouth. As needed      No current facility-administered medications on file prior to visit.    Review of Systems  Constitutional: Negative for unusual diaphoresis or night sweats HENT: Negative for ear swelling or discharge Eyes:  Negative for worsening visual haziness  Respiratory: Negative for choking and stridor.   Gastrointestinal: Negative for distension or worsening eructation Genitourinary: Negative for retention or change in urine volume.  Musculoskeletal: Negative for other MSK pain or swelling Skin: Negative for color change and worsening wound Neurological: Negative for tremors and numbness other than noted  Psychiatric/Behavioral: Negative for decreased concentration or agitation other than above       Objective:   Physical Exam BP 140/80   Pulse 75   Temp 98 F (36.7 C) (Oral)   Resp 20   Wt 157 lb (71.2 kg)   SpO2 97%   BMI 26.95 kg/m  VS noted,  Constitutional: Pt appears in no apparent distress HENT: Head: NCAT.  Right Ear: External ear normal.  Left Ear: External ear normal.  Eyes: . Pupils are equal, round, and reactive to light. Conjunctivae and EOM are normal Neck: Normal range of motion. Neck supple.  Cardiovascular: Normal rate and regular rhythm.   Pulmonary/Chest: Effort normal and breath sounds without rales or wheezing.  Neurological: Pt is alert. Not confused , motor grossly intact Skin: Skin is warm. No rash, but with 1-2+ diffuse medial foot and ankle erythema, swelling, tender without ulceration or drainage Psychiatric: Pt behavior is normal. No agitation.     Assessment & Plan:

## 2016-02-07 NOTE — Patient Instructions (Signed)
Please take all new medication as prescribed  Please continue all other medications as before, and refills have been done if requested.  Please have the pharmacy call with any other refills you may need.  Please keep your appointments with your specialists as you may have planned     

## 2016-02-07 NOTE — Progress Notes (Signed)
Pre visit review using our clinic review tool, if applicable. No additional management support is needed unless otherwise documented below in the visit note. 

## 2016-02-09 NOTE — Assessment & Plan Note (Signed)
Mild to mod, no f/c, red streaks, drainage or ulcer, for oral antbx,  to f/u any worsening symptoms or concerns

## 2016-02-09 NOTE — Assessment & Plan Note (Signed)
stable overall by history and exam, recent data reviewed with pt, and pt to continue medical treatment as before,  to f/u any worsening symptoms or concerns BP Readings from Last 3 Encounters:  02/07/16 140/80  05/16/15 121/67  05/16/15 130/70

## 2016-02-09 NOTE — Assessment & Plan Note (Signed)
stable overall by history and exam, recent data reviewed with pt, and pt to continue medical treatment as before,  to f/u any worsening symptoms or concerns Lab Results  Component Value Date   HGBA1C 5.7 05/20/2007

## 2016-02-12 ENCOUNTER — Other Ambulatory Visit: Payer: Self-pay | Admitting: Internal Medicine

## 2016-02-20 ENCOUNTER — Ambulatory Visit (AMBULATORY_SURGERY_CENTER): Payer: Self-pay | Admitting: *Deleted

## 2016-02-20 ENCOUNTER — Encounter: Payer: Self-pay | Admitting: Internal Medicine

## 2016-02-20 VITALS — Ht 66.5 in | Wt 159.0 lb

## 2016-02-20 DIAGNOSIS — Z1211 Encounter for screening for malignant neoplasm of colon: Secondary | ICD-10-CM

## 2016-02-20 DIAGNOSIS — J3489 Other specified disorders of nose and nasal sinuses: Secondary | ICD-10-CM

## 2016-02-20 MED ORDER — NA SULFATE-K SULFATE-MG SULF 17.5-3.13-1.6 GM/177ML PO SOLN: 1.0000 | Freq: Once | ORAL | 0 refills | Status: AC

## 2016-02-20 NOTE — Progress Notes (Signed)
Denies allergies to eggs or soy products. Denies complications with sedation or anesthesia. Denies O2 use. Denies use of diet or weight loss medications.  Emmi instructions given for colonoscopy.  

## 2016-02-24 ENCOUNTER — Telehealth: Payer: Self-pay | Admitting: Internal Medicine

## 2016-02-24 NOTE — Telephone Encounter (Signed)
Suprep Bowel Kit called into [facility]. 

## 2016-02-28 ENCOUNTER — Encounter: Payer: Self-pay | Admitting: Internal Medicine

## 2016-02-28 ENCOUNTER — Ambulatory Visit (AMBULATORY_SURGERY_CENTER): Payer: Medicare Other | Admitting: Internal Medicine

## 2016-02-28 VITALS — BP 130/58 | HR 66 | Temp 98.4°F | Resp 12 | Ht 66.5 in | Wt 159.0 lb

## 2016-02-28 DIAGNOSIS — Z1211 Encounter for screening for malignant neoplasm of colon: Secondary | ICD-10-CM

## 2016-02-28 MED ORDER — SODIUM CHLORIDE 0.9 % IV SOLN: 500.0000 mL | INTRAVENOUS | Status: DC

## 2016-02-28 NOTE — Progress Notes (Signed)
Report to PACU, RN, vss, BBS= Clear.  

## 2016-02-28 NOTE — Patient Instructions (Signed)
YOU HAD AN ENDOSCOPIC PROCEDURE TODAY AT [facility]:   Refer to the procedure report that was given to you for any specific questions about what was found during the examination.  If the procedure report does not answer your questions, please call your gastroenterologist to clarify.  If you requested that your care partner not be given the details of your procedure findings, then the procedure report has been included in a sealed envelope for you to review at your convenience later.  YOU SHOULD EXPECT: Some feelings of bloating in the abdomen. Passage of more gas than usual.  Walking can help get rid of the air that was put into your GI tract during the procedure and reduce the bloating. If you had a lower endoscopy (such as a colonoscopy or flexible sigmoidoscopy) you may notice spotting of blood in your stool or on the toilet paper. If you underwent a bowel prep for your procedure, you may not have a normal bowel movement for a few days.  Please Note:  You might notice some irritation and congestion in your nose or some drainage.  This is from the oxygen used during your procedure.  There is no need for concern and it should clear up in a day or so.  SYMPTOMS TO REPORT IMMEDIATELY:   Following lower endoscopy (colonoscopy or flexible sigmoidoscopy):  Excessive amounts of blood in the stool  Significant tenderness or worsening of abdominal pains  Swelling of the abdomen that is new, acute  Fever of 100F or higher   For urgent or emergent issues, a gastroenterologist can be reached at any hour by calling [phone].   DIET:  We do recommend a small meal at first, but then you may proceed to your regular diet.  Drink plenty of fluids but you should avoid alcoholic beverages for 24 hours.  ACTIVITY:  You should plan to take it easy for the rest of today and you should NOT DRIVE or use heavy machinery until tomorrow (because of the sedation medicines used during the test).     FOLLOW UP: Our staff will call the number listed on your records the next business day following your procedure to check on you and address any questions or concerns that you may have regarding the information given to you following your procedure. If we do not reach you, we will leave a message.  However, if you are feeling well and you are not experiencing any problems, there is no need to return our call.  We will assume that you have returned to your regular daily activities without incident.  If any biopsies were taken you will be contacted by phone or by letter within the next 1-3 weeks.  Please call us at [phone] if you have not heard about the biopsies in 3 weeks.    SIGNATURES/CONFIDENTIALITY: You and/or your care partner have signed paperwork which will be entered into your electronic medical record.  These signatures attest to the fact that that the information above on your After Visit Summary has been reviewed and is understood.  Full responsibility of the confidentiality of this discharge information lies with you and/or your care-partner.  Normal colonoscopy.  Repeat in 10 years-2027.

## 2016-02-28 NOTE — Op Note (Signed)
[facility] Patient Name: Haley Armstrong Procedure Date: 02/28/2016 9:11 AM MRN: [id number] Endoscopist: Docia Chuck. Henrene Pastor , MD Age: 70 Referring MD:  Date of Birth: [birth year] Gender: Female Account #: [id number] Procedure:                Colonoscopy Indications:              Screening for colorectal malignant neoplasm.                            Negative index exam with Dr. Sharlett Iles 2007 Medicines:                Monitored Anesthesia Care Procedure:                Pre-Anesthesia Assessment:                           - Prior to the procedure, a History and Physical                            was performed, and patient medications and                            allergies were reviewed. The patient's tolerance of                            previous anesthesia was also reviewed. The risks                            and benefits of the procedure and the sedation                            options and risks were discussed with the patient.                            All questions were answered, and informed consent                            was obtained. Prior Anticoagulants: The patient has                            taken no previous anticoagulant or antiplatelet                            agents. ASA Grade Assessment: II - A patient with                            mild systemic disease. After reviewing the risks                            and benefits, the patient was deemed in                            satisfactory condition to undergo the procedure.  After obtaining informed consent, the colonoscope                            was passed under direct vision. Throughout the                            procedure, the patient's blood pressure, pulse, and                            oxygen saturations were monitored continuously. The                            Model CF-HQ190L [id number]) scope was introduced                            through the anus and  advanced to the the cecum,                            identified by appendiceal orifice and ileocecal                            valve. The ileocecal valve, appendiceal orifice,                            and rectum were photographed. The quality of the                            bowel preparation was excellent. The colonoscopy                            was performed without difficulty. The patient                            tolerated the procedure well. The bowel preparation                            used was SUPREP. Scope In: 9:16:16 AM Scope Out: 9:30:16 AM Scope Withdrawal Time: 0 hours 11 minutes 25 seconds  Total Procedure Duration: 0 hours 14 minutes 0 seconds  Findings:                 The entire examined colon appeared normal on direct                            and retroflexion views. Complications:            No immediate complications. Estimated blood loss:                            None. Estimated Blood Loss:     Estimated blood loss: none. Impression:               - The entire examined colon is normal on direct and  retroflexion views.                           - No specimens collected. Recommendation:           - Repeat colonoscopy in 10 years for screening                            purposes.                           - Patient has a contact number available for                            emergencies. The signs and symptoms of potential                            delayed complications were discussed with the                            patient. Return to normal activities tomorrow.                            Written discharge instructions were provided to the                            patient.                           - Resume previous diet.                           - Continue present medications. Docia Chuck. Henrene Pastor, MD 02/28/2016 9:32:49 AM This report has been signed electronically.

## 2016-03-03 ENCOUNTER — Telehealth: Payer: Self-pay

## 2016-03-03 NOTE — Telephone Encounter (Signed)
  Follow up Call-  Call back number 02/28/2016  Post procedure Call Back phone  # [phone]  Permission to leave phone message Yes  Some recent data might be hidden     Patient questions:  Do you have a fever, pain , or abdominal swelling? No. Pain Score  0 *  Have you tolerated food without any problems? Yes.    Have you been able to return to your normal activities? Yes.    Do you have any questions about your discharge instructions: Diet   No. Medications  No. Follow up visit  No.  Do you have questions or concerns about your Care? No.  Actions: * If pain score is 4 or above: No action needed, pain <4.  No problems per the pt. aw

## 2016-03-05 ENCOUNTER — Ambulatory Visit (INDEPENDENT_AMBULATORY_CARE_PROVIDER_SITE_OTHER): Payer: Medicare Other

## 2016-03-05 VITALS — BP 132/70 | HR 71 | Ht 66.5 in | Wt 156.2 lb

## 2016-03-05 DIAGNOSIS — Z780 Asymptomatic menopausal state: Secondary | ICD-10-CM

## 2016-03-05 DIAGNOSIS — Z7289 Other problems related to lifestyle: Secondary | ICD-10-CM | POA: Diagnosis not present

## 2016-03-05 DIAGNOSIS — Z0000 Encounter for general adult medical examination without abnormal findings: Secondary | ICD-10-CM

## 2016-03-05 DIAGNOSIS — Z23 Encounter for immunization: Secondary | ICD-10-CM | POA: Diagnosis not present

## 2016-03-05 NOTE — Patient Instructions (Addendum)
Haley Armstrong , Thank you for taking time to come for your Medicare Wellness Visit. I appreciate your ongoing commitment to your health goals. Please review the following plan we discussed and let me know if I can assist you in the future.   Will take the regular flu vaccine today  Will have dexa at [facility] this year  Will fup with mammogram when due   Will have Hepatitis c drawn with blood work in Oct   These are the goals we discussed: Goals    . patient          Left [employer] after 39 years  Goal is to try to find another job in [employer];        This is a list of the screening recommended for you and due dates:  Health Maintenance  Topic Date Due  .  Hepatitis C: One time screening is recommended by Center for Disease Control  (CDC) for  adults born from [birth year] through 1964.   May 28, 1946  . DEXA scan (bone density measurement)  11/24/2011  . Mammogram  10/28/2015  . Flu Shot  01/28/2016  . Tetanus Vaccine  03/26/2023  . Colon Cancer Screening  02/27/2026  . Shingles Vaccine  Completed  . Pneumonia vaccines  Completed      Fall Prevention in the Home  Falls can cause injuries. They can happen to people of all ages. There are many things you can do to make your home safe and to help prevent falls.  WHAT CAN I DO ON THE OUTSIDE OF MY HOME?  Regularly fix the edges of walkways and driveways and fix any cracks.  Remove anything that might make you trip as you walk through a door, such as a raised step or threshold.  Trim any bushes or trees on the path to your home.  Use bright outdoor lighting.  Clear any walking paths of anything that might make someone trip, such as rocks or tools.  Regularly check to see if handrails are loose or broken. Make sure that both sides of any steps have handrails.  Any raised decks and porches should have guardrails on the edges.  Have any leaves, snow, or ice cleared regularly.  Use sand or salt on walking paths during  winter.  Clean up any spills in your garage right away. This includes oil or grease spills. WHAT CAN I DO IN THE BATHROOM?   Use night lights.  Install grab bars by the toilet and in the tub and shower. Do not use towel bars as grab bars.  Use non-skid mats or decals in the tub or shower.  If you need to sit down in the shower, use a plastic, non-slip stool.  Keep the floor dry. Clean up any water that spills on the floor as soon as it happens.  Remove soap buildup in the tub or shower regularly.  Attach bath mats securely with double-sided non-slip rug tape.  Do not have throw rugs and other things on the floor that can make you trip. WHAT CAN I DO IN THE BEDROOM?  Use night lights.  Make sure that you have a light by your bed that is easy to reach.  Do not use any sheets or blankets that are too big for your bed. They should not hang down onto the floor.  Have a firm chair that has side arms. You can use this for support while you get dressed.  Do not have throw rugs and other things  on the floor that can make you trip. WHAT CAN I DO IN THE KITCHEN?  Clean up any spills right away.  Avoid walking on wet floors.  Keep items that you use a lot in easy-to-reach places.  If you need to reach something above you, use a strong step stool that has a grab bar.  Keep electrical cords out of the way.  Do not use floor polish or wax that makes floors slippery. If you must use wax, use non-skid floor wax.  Do not have throw rugs and other things on the floor that can make you trip. WHAT CAN I DO WITH MY STAIRS?  Do not leave any items on the stairs.  Make sure that there are handrails on both sides of the stairs and use them. Fix handrails that are broken or loose. Make sure that handrails are as long as the stairways.  Check any carpeting to make sure that it is firmly attached to the stairs. Fix any carpet that is loose or worn.  Avoid having throw rugs at the top or  bottom of the stairs. If you do have throw rugs, attach them to the floor with carpet tape.  Make sure that you have a light switch at the top of the stairs and the bottom of the stairs. If you do not have them, ask someone to add them for you. WHAT ELSE CAN I DO TO HELP PREVENT FALLS?  Wear shoes that:  Do not have high heels.  Have rubber bottoms.  Are comfortable and fit you well.  Are closed at the toe. Do not wear sandals.  If you use a stepladder:  Make sure that it is fully opened. Do not climb a closed stepladder.  Make sure that both sides of the stepladder are locked into place.  Ask someone to hold it for you, if possible.  Clearly mark and make sure that you can see:  Any grab bars or handrails.  First and last steps.  Where the edge of each step is.  Use tools that help you move around (mobility aids) if they are needed. These include:  Canes.  Walkers.  Scooters.  Crutches.  Turn on the lights when you go into a dark area. Replace any light bulbs as soon as they burn out.  Set up your furniture so you have a clear path. Avoid moving your furniture around.  If any of your floors are uneven, fix them.  If there are any pets around you, be aware of where they are.  Review your medicines with your doctor. Some medicines can make you feel dizzy. This can increase your chance of falling. Ask your doctor what other things that you can do to help prevent falls.   This information is not intended to replace advice given to you by your health care provider. Make sure you discuss any questions you have with your health care provider.   Document Released: 04/11/2009 Document Revised: 10/30/2014 Document Reviewed: 07/20/2014 Elsevier Interactive Patient Education 2016 [place] Maintenance, Female Adopting a healthy lifestyle and getting preventive care can go a long way to promote health and wellness. Talk with your health care provider about  what schedule of regular examinations is right for you. This is a good chance for you to check in with your provider about disease prevention and staying healthy. In between checkups, there are plenty of things you can do on your own. Experts have done a lot of research about  which lifestyle changes and preventive measures are most likely to keep you healthy. Ask your health care provider for more information. WEIGHT AND DIET  Eat a healthy diet  Be sure to include plenty of vegetables, fruits, low-fat dairy products, and lean protein.  Do not eat a lot of foods high in solid fats, added sugars, or salt.  Get regular exercise. This is one of the most important things you can do for your health.  Most adults should exercise for at least 150 minutes each week. The exercise should increase your heart rate and make you sweat (moderate-intensity exercise).  Most adults should also do strengthening exercises at least twice a week. This is in addition to the moderate-intensity exercise.  Maintain a healthy weight  Body mass index (BMI) is a measurement that can be used to identify possible weight problems. It estimates body fat based on height and weight. Your health care provider can help determine your BMI and help you achieve or maintain a healthy weight.  For females 62 years of age and older:   A BMI below 18.5 is considered underweight.  A BMI of 18.5 to 24.9 is normal.  A BMI of 25 to 29.9 is considered overweight.  A BMI of 30 and above is considered obese.  Watch levels of cholesterol and blood lipids  You should start having your blood tested for lipids and cholesterol at 69 years of age, then have this test every 5 years.  You may need to have your cholesterol levels checked more often if:  Your lipid or cholesterol levels are high.  You are older than 69 years of age.  You are at high risk for heart disease.  CANCER SCREENING   Lung Cancer  Lung cancer screening is  recommended for adults 26-58 years old who are at high risk for lung cancer because of a history of smoking.  A yearly low-dose CT scan of the lungs is recommended for people who:  Currently smoke.  Have quit within the past 15 years.  Have at least a 30-pack-year history of smoking. A pack year is smoking an average of one pack of cigarettes a day for 1 year.  Yearly screening should continue until it has been 15 years since you quit.  Yearly screening should stop if you develop a health problem that would prevent you from having lung cancer treatment.  Breast Cancer  Practice breast self-awareness. This means understanding how your breasts normally appear and feel.  It also means doing regular breast self-exams. Let your health care provider know about any changes, no matter how small.  If you are in your 20s or 30s, you should have a clinical breast exam (CBE) by a health care provider every 1-3 years as part of a regular health exam.  If you are 51 or older, have a CBE every year. Also consider having a breast X-ray (mammogram) every year.  If you have a family history of breast cancer, talk to your health care provider about genetic screening.  If you are at high risk for breast cancer, talk to your health care provider about having an MRI and a mammogram every year.  Breast cancer gene (BRCA) assessment is recommended for women who have family members with BRCA-related cancers. BRCA-related cancers include:  Breast.  Ovarian.  Tubal.  Peritoneal cancers.  Results of the assessment will determine the need for genetic counseling and BRCA1 and BRCA2 testing. Cervical Cancer Your health care provider may recommend that you  be screened regularly for cancer of the pelvic organs (ovaries, uterus, and vagina). This screening involves a pelvic examination, including checking for microscopic changes to the surface of your cervix (Pap test). You may be encouraged to have this  screening done every 3 years, beginning at age 99.  For women ages 87-65, health care providers may recommend pelvic exams and Pap testing every 3 years, or they may recommend the Pap and pelvic exam, combined with testing for human papilloma virus (HPV), every 5 years. Some types of HPV increase your risk of cervical cancer. Testing for HPV may also be done on women of any age with unclear Pap test results.  Other health care providers may not recommend any screening for nonpregnant women who are considered low risk for pelvic cancer and who do not have symptoms. Ask your health care provider if a screening pelvic exam is right for you.  If you have had past treatment for cervical cancer or a condition that could lead to cancer, you need Pap tests and screening for cancer for at least 20 years after your treatment. If Pap tests have been discontinued, your risk factors (such as having a new sexual partner) need to be reassessed to determine if screening should resume. Some women have medical problems that increase the chance of getting cervical cancer. In these cases, your health care provider may recommend more frequent screening and Pap tests. Colorectal Cancer  This type of cancer can be detected and often prevented.  Routine colorectal cancer screening usually begins at 69 years of age and continues through 69 years of age.  Your health care provider may recommend screening at an earlier age if you have risk factors for colon cancer.  Your health care provider may also recommend using home test kits to check for hidden blood in the stool.  A small camera at the end of a tube can be used to examine your colon directly (sigmoidoscopy or colonoscopy). This is done to check for the earliest forms of colorectal cancer.  Routine screening usually begins at age 70.  Direct examination of the colon should be repeated every 5-10 years through 69 years of age. However, you may need to be screened  more often if early forms of precancerous polyps or small growths are found. Skin Cancer  Check your skin from head to toe regularly.  Tell your health care provider about any new moles or changes in moles, especially if there is a change in a mole's shape or color.  Also tell your health care provider if you have a mole that is larger than the size of a pencil eraser.  Always use sunscreen. Apply sunscreen liberally and repeatedly throughout the day.  Protect yourself by wearing long sleeves, pants, a wide-brimmed hat, and sunglasses whenever you are outside. HEART DISEASE, DIABETES, AND HIGH BLOOD PRESSURE   High blood pressure causes heart disease and increases the risk of stroke. High blood pressure is more likely to develop in:  People who have blood pressure in the high end of the normal range (130-139/85-89 mm Hg).  People who are overweight or obese.  People who are African American.  If you are 86-60 years of age, have your blood pressure checked every 3-5 years. If you are 58 years of age or older, have your blood pressure checked every year. You should have your blood pressure measured twice--once when you are at a hospital or clinic, and once when you are not at a hospital or  clinic. Record the average of the two measurements. To check your blood pressure when you are not at a hospital or clinic, you can use:  An automated blood pressure machine at a pharmacy.  A home blood pressure monitor.  If you are between 39 years and 77 years old, ask your health care provider if you should take aspirin to prevent strokes.  Have regular diabetes screenings. This involves taking a blood sample to check your fasting blood sugar level.  If you are at a normal weight and have a low risk for diabetes, have this test once every three years after 69 years of age.  If you are overweight and have a high risk for diabetes, consider being tested at a younger age or more often. PREVENTING  INFECTION  Hepatitis B  If you have a higher risk for hepatitis B, you should be screened for this virus. You are considered at high risk for hepatitis B if:  You were born in a country where hepatitis B is common. Ask your health care provider which countries are considered high risk.  Your parents were born in a high-risk country, and you have not been immunized against hepatitis B (hepatitis B vaccine).  You have HIV or AIDS.  You use needles to inject street drugs.  You live with someone who has hepatitis B.  You have had sex with someone who has hepatitis B.  You get hemodialysis treatment.  You take certain medicines for conditions, including cancer, organ transplantation, and autoimmune conditions. Hepatitis C  Blood testing is recommended for:  Everyone born from [birth year] through 1964.  Anyone with known risk factors for hepatitis C. Sexually transmitted infections (STIs)  You should be screened for sexually transmitted infections (STIs) including gonorrhea and chlamydia if:  You are sexually active and are younger than 69 years of age.  You are older than 69 years of age and your health care provider tells you that you are at risk for this type of infection.  Your sexual activity has changed since you were last screened and you are at an increased risk for chlamydia or gonorrhea. Ask your health care provider if you are at risk.  If you do not have HIV, but are at risk, it may be recommended that you take a prescription medicine daily to prevent HIV infection. This is called pre-exposure prophylaxis (PrEP). You are considered at risk if:  You are sexually active and do not regularly use condoms or know the HIV status of your partner(s).  You take drugs by injection.  You are sexually active with a partner who has HIV. Talk with your health care provider about whether you are at high risk of being infected with HIV. If you choose to begin PrEP, you should first be  tested for HIV. You should then be tested every 3 months for as long as you are taking PrEP.  PREGNANCY   If you are premenopausal and you may become pregnant, ask your health care provider about preconception counseling.  If you may become pregnant, take 400 to 800 micrograms (mcg) of folic acid every day.  If you want to prevent pregnancy, talk to your health care provider about birth control (contraception). OSTEOPOROSIS AND MENOPAUSE   Osteoporosis is a disease in which the bones lose minerals and strength with aging. This can result in serious bone fractures. Your risk for osteoporosis can be identified using a bone density scan.  If you are 57 years of age or older,  or if you are at risk for osteoporosis and fractures, ask your health care provider if you should be screened.  Ask your health care provider whether you should take a calcium or vitamin D supplement to lower your risk for osteoporosis.  Menopause may have certain physical symptoms and risks.  Hormone replacement therapy may reduce some of these symptoms and risks. Talk to your health care provider about whether hormone replacement therapy is right for you.  HOME CARE INSTRUCTIONS   Schedule regular health, dental, and eye exams.  Stay current with your immunizations.   Do not use any tobacco products including cigarettes, chewing tobacco, or electronic cigarettes.  If you are pregnant, do not drink alcohol.  If you are breastfeeding, limit how much and how often you drink alcohol.  Limit alcohol intake to no more than 1 drink per day for nonpregnant women. One drink equals 12 ounces of beer, 5 ounces of wine, or 1 ounces of hard liquor.  Do not use street drugs.  Do not share needles.  Ask your health care provider for help if you need support or information about quitting drugs.  Tell your health care provider if you often feel depressed.  Tell your health care provider if you have ever been abused or  do not feel safe at home.   This information is not intended to replace advice given to you by your health care provider. Make sure you discuss any questions you have with your health care provider.   Document Released: 12/29/2010 Document Revised: 07/06/2014 Document Reviewed: 05/17/2013 Elsevier Interactive Patient Education [employer].

## 2016-03-05 NOTE — Progress Notes (Addendum)
Subjective:   Haley Armstrong is a 70 y.o. female who presents for Medicare Annual (Subsequent) preventive examination.   Cardiac Risk Factors include: advanced age (>19men, >36 women);dyslipidemia;hypertension HRA assessment completed during this visit with Ms. Popper The Patient was informed that the wellness visit is to identify future health risk and educate and initiate measures that can reduce risk for increased disease through the lifespan.    NO ROS; Medicare Wellness Visit/ Labs 2015; apt scheduled with Dr. Alain Marion on 04/23/16 Describes health as good, fair or great? Good Just had cellulitis due to ant bites; was mowing the yard   Spouse just in for a pacemaker recently; Has had 2 stents procedures; Spouse to have cath tomorrow but overall is in very good shape and stays very active   Lives in [place] x 70 yo; Sold home at the [facility] is one story with bonus room Does do stairs; bedrooms downstairs   Having issues with tingling in left arm; going numb; thigs has been going on for awhile; had MRI and states it is not her cervical vertebrae. Will discuss plan with Dr. Alain Marion next visit  Also has issue with hip; states she fell on left hip x 70 yo; never had it xrayed but has a "bump" on it. No pain at present Went to [facility] but was more concerned about lett shoulder than hip at the time   Discusses ADHD; will discuss with Dr. 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   Social History   Social History  . Marital status: Married    Spouse name: N/A  . Number of children: N/A  . Years of education: N/A   Occupational History  . 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 Retired    Social History Main Topics  . Smoking status: Former Research scientist (life sciences)  . Smokeless tobacco: Never Used  . Alcohol use 1.8 oz/week    3 Glasses of wine per week  . Drug use: No  . Sexual activity: Not on  file   Other Topics Concern  . Not on file   Social History Narrative   Taking care of her old mother.         Update tobacco or ETOH:  Remote smoking; smoked more when younger    Medications reviewed for issues;    BMI: 26   Diet protein shakes in am Lunch;  Protein shake for lunch; 10 gm protein bar [place] taco;  Salad Supper:  Has lost a few pounds   Dental work: no issues   Exercise;   Working in [employer];  9 hours per day; 3 days one week and 4 days the next Does Zumba; other exercise video's     Fall hx; no   Given education on "Fall Prevention in the Home" for more safety tips the patient can apply as appropriate.   Personal safety issues reviewed:  1.  for risk such as safe community 2.  smoke detector 3.  firearms safety if applicable  4. protection when in the sun;  5. driving safety for seniors or any recent accidents.   Depression; anxiety or mood issues assessed; no issues verbalized   Cognitive screen completed; MMSE documented or assessed for failures or issues with the AD8 screen below:   Ad8 score reviewed for issues;  Issues making decisions; no  Less interest in hobbies /  activities" no  Repeats questions, stories; family complaining: NO  Trouble using ordinary gadgets; microwave; computer: no  Forgets the month or year: no  Mismanaging finances: no  Missing apt: no but does write them down  Daily problems with thinking of memory NO Ad8 score is 0  MMSE not appropriate unless AD8 score is > 2   Advanced Directive reviewed for completion or educated regarding Zacarias Pontes form; the electing a health care agent and completing the Living Will. Took form home for information;  Dtr is a Chief Executive Officer.   Assessed for Preventive Heath Gaps and completion;  Health Maintenance Due  Topic Date Due  . Hepatitis C Screening  Apr 21, 1946  . DEXA SCAN  11/24/2011  . MAMMOGRAM  10/28/2015  . INFLUENZA VACCINE  01/28/2016   Will  order Hep C - educated  Will order dexa scan to [facility] at [facility] but will confirm the date of her last dexa  [facility]  Flu shot; agreed to take the "regular" flu shot, not the high dose   Mammogram in 2015 Dexa at [facility] 07/07/2010; would have been 65; Guideline recommends one at 65; check [facility];  Mammogram; 2016; annually  Will check with [facility] on dexa;  Colonoscopy 02/2016 completed  EKG 05/16/2015  Future due dates reviewed with the patient for accuracy.    Established and updated Risk reviewed and appropriate referral made or health recommendations as appropriate based on individual needs and choices;   Current Care Team reviewed and updated     Objective:     Vitals: BP 132/70   Pulse 71   Ht 5' 6.5" (1.689 m)   Wt 156 lb 4 oz (70.9 kg)   SpO2 98%   BMI 24.84 kg/m   Body mass index is 24.84 kg/m.   Tobacco History  Smoking Status  . Former Smoker  Smokeless Tobacco  . Never Used     Counseling given: Yes   Past Medical History:  Diagnosis Date  . Anemia, iron deficiency   . Anxiety   . Blood donor   . GERD (gastroesophageal reflux disease)   . Hypertension   . IBS (irritable bowel syndrome)   . Osteoarthritis    Past Surgical History:  Procedure Laterality Date  . APPENDECTOMY    . CERVICAL FUSION  2011   C4-5 Dr Marcial Pacas  . CHOLECYSTECTOMY    . ESOPHAGEAL MANOMETRY N/A 11/13/2013   Procedure: ESOPHAGEAL MANOMETRY (EM);  Surgeon: Irene Shipper, MD;  Location: [facility];  Service: Endoscopy;  Laterality: N/A;   Family History  Problem Relation Age of Onset  . Lymphoma Mother   . Dementia Mother   . Uterine cancer Mother 73    uterine sarcoma  . Parkinsonism Father   . Heart disease Brother     CAD  . Heart attack Brother   . Heart disease Maternal Aunt   . Heart attack Maternal Aunt   . Colon cancer Neg Hx   . Esophageal cancer Neg Hx   . Rectal cancer Neg Hx   . Stomach cancer Neg Hx    Last lipid panel 2015; to repeat  in October with MD apt. HDl 57; Trig 55 and LDL 118 Recommended low fat diet and exercise   History  Sexual Activity  . Sexual activity: Not on file    Outpatient Encounter Prescriptions as of 03/05/2016  Medication Sig  . AMBULATORY NON FORMULARY MEDICATION Medication Name: Mylanta and Lidocaine   Take 30-60 cc  by mouth twice a day as needed  . amLODipine (NORVASC) 5 MG tablet Take 0.5 tablets (2.5 mg total) by mouth daily. Keep Oct appt for future refills  . APAP-Isometheptene-Dichloral (EPIDRIN) 325-65-100 MG CAPS Take by mouth 2 (two) times daily as needed.    Marland Kitchen aspirin 81 MG EC tablet Take 81 mg by mouth 2 (two) times daily.    Marland Kitchen b complex vitamins tablet Take 1 tablet by mouth daily.    . Cholecalciferol 1000 UNITS tablet Take 1,000 Units by mouth daily.    . Diclofenac Sodium (PENNSAID) 1.5 % SOLN APPLY 3 TO 5 DROPS ON SKIN THREE TIMES A DAY FOR PAIN  . diclofenac sodium (VOLTAREN) 1 % GEL Apply 4 g topically 4 (four) times daily.  . enalapril (VASOTEC) 5 MG tablet TAKE ONE (1) TABLET BY MOUTH TWO (2) TIMES DAILY  . hyoscyamine (LEVBID) 0.375 MG 12 hr tablet TAKE ONE (1) TABLET BY MOUTH EVERY 12 HOURS AS NEEDED  . hyoscyamine (LEVSIN SL) 0.125 MG SL tablet USE ONE TO TWO TABLETS SUBLINGUALLY EVERY 4 HOURS AS NEEDED FOR PAIN  . LORazepam (ATIVAN) 0.5 MG tablet TAKE ONE (1) TABLET BY MOUTH 3 TIMES DAILY AS NEEDED BETWEEN MEALS AND BEDTIME FOR ANXIETY  . meloxicam (MOBIC) 15 MG tablet TAKE 1/2 TO 1 TABLET BY MOUTH DAILY  . methocarbamol (ROBAXIN) 500 MG tablet Take 1 tablet (500 mg total) by mouth 4 (four) times daily as needed.  . Methylcellulose, Laxative, (CITRUCEL PO) Take by mouth. One once daily   . Multiple Vitamins-Minerals (MULTIVITAMIN,TX-MINERALS) tablet Take 1 tablet by mouth daily.    . Omega 3 1000 MG CAPS Take 2 capsules by mouth daily.  . pantoprazole (PROTONIX) 40 MG tablet TAKE ONE (1) TABLET BY MOUTH TWO (2) TIMES DAILY  . pregabalin (LYRICA) 150 MG capsule Take 1  capsule (150 mg total) by mouth 2 (two) times daily.  . Sennosides (SENNA LAX PO) Take by mouth. As needed    Facility-Administered Encounter Medications as of 03/05/2016  Medication  . 0.9 %  sodium chloride infusion    Activities of Daily Living In your present state of health, do you have any difficulty performing the following activities: 03/05/2016  Hearing? N  Vision? Y  Difficulty concentrating or making decisions? N  Walking or climbing stairs? N  Dressing or bathing? N  Doing errands, shopping? N  Preparing Food and eating ? N  Using the Toilet? N  In the past six months, have you accidently leaked urine? N  Do you have problems with loss of bowel control? N  Managing your Medications? N  Managing your Finances? N  Housekeeping or managing your Housekeeping? N  Some recent data might be hidden    Patient Care Team: Cassandria Anger, MD as PCP - General Pedro Earls, MD as Attending Physician (Family Medicine)    Assessment:    Assessment included:  Review for health history  Self Assessment of health status; poor, fair, good or Great  Psychosocial risk; stress; depression; pain; lack of support; lack of income to buy groceries, meds etc.  Behavioral risk addressed such as tobacco, ETOH; diet (metabolic syndrome) and exercise  Loss of ability to function independently with ADLs or IADLs  Risk for independent living   Risk for safety; Bathroom; falls or railing or a decline in gait; which include review of osteopenia   Review of over due preventive screens with plan to obtain or educate as appropriate   Changes in  mental status   Medication reconciliation, past medical history, social history, problem list and allergies were reviewed in detail with the patient  Goals were established with regard to weight loss, exercise, and diet in compliance with medications based on the patient individualized risk;   End of life planning was discussed. Agreed to  take Zacarias Pontes form home.      Exercise Activities and Dietary recommendations Current Exercise Habits: Home exercise routine, Type of exercise: walking (videos), Frequency (Times/Week): 4, Intensity: Moderate  Goals    . patient          Left [employer] financial after 39 years  Goal is to try to find another job in [employer];       Fall Risk Fall Risk  03/05/2016 10/02/2014  Falls in the past year? No No   Depression Screen PHQ 2/9 Scores 03/05/2016 03/18/2015 03/12/2015 10/02/2014  PHQ - 2 Score 0 0 0 0     Cognitive Testing MMSE - Mini Mental State Exam 03/05/2016  Not completed: (No Data)    Immunization History  Administered Date(s) Administered  . Influenza Split 03/16/2011  . Influenza Whole 05/24/2007, 03/21/2008, 05/29/2009, 05/06/2010  . Influenza,inj,Quad PF,36+ Mos 03/25/2013, 03/21/2014, 04/18/2015, 03/05/2016  . Pneumococcal Conjugate-13 04/18/2015  . Pneumococcal Polysaccharide-23 06/07/2013  . Td 04/02/2004  . Tdap 03/25/2013  . Zoster 11/09/2007   Screening Tests Health Maintenance  Topic Date Due  . Hepatitis C Screening  [birth year]  . DEXA SCAN  11/24/2011  . MAMMOGRAM  10/28/2015  . INFLUENZA VACCINE  01/28/2016  . TETANUS/TDAP  03/26/2023  . COLONOSCOPY  02/27/2026  . ZOSTAVAX  Completed  . PNA vac Low Risk Adult  Completed      Plan:     To take the "regular" flu shot today Will have dexa at [facility] this year/ postponed until 12/31   Will fup with mammogram when due this year; postponed until 12/31;   Will have Hepatitis c drawn with blood work in Oct   During the course of the visit the patient was educated and counseled about the following appropriate screening and preventive services:   Vaccines to include Pneumoccal, Influenza, Hepatitis B, Td, Zostavax, HCV  Electrocardiogram  Cardiovascular Disease; neg   Colorectal cancer screening' just completed this month   Bone density screening/ to be scheduled   Diabetes screening  neg  Glaucoma screening/neg   Mammography/will fup with [facility]   Nutrition counseling   Patient Instructions (the written plan) was given to the patient.   [id number], RN  03/05/2016   Medical screening examination/treatment/procedure(s) were performed by non-physician practitioner and as supervising provider I was immediately available for consultation/collaboration. I agree with treatment plan. Mauricio Po, FNP

## 2016-03-18 ENCOUNTER — Telehealth: Payer: Self-pay

## 2016-03-18 NOTE — Telephone Encounter (Signed)
Call back to Haley Armstrong. LVM asking if Dr. Alain Marion will draw a TSH (or check her thyroid) when he see her 10/26.  Called back to number given [phone] and stated that her labs were drawn 10/20 last year and will be due when she sees Dr. Alain Marion.

## 2016-03-30 ENCOUNTER — Other Ambulatory Visit: Payer: Self-pay | Admitting: Internal Medicine

## 2016-04-15 ENCOUNTER — Other Ambulatory Visit: Payer: Self-pay | Admitting: Internal Medicine

## 2016-04-15 NOTE — Telephone Encounter (Signed)
Ok to rf in PCP's absence? Thanks!

## 2016-04-23 ENCOUNTER — Ambulatory Visit (INDEPENDENT_AMBULATORY_CARE_PROVIDER_SITE_OTHER): Payer: Medicare Other | Admitting: Internal Medicine

## 2016-04-23 ENCOUNTER — Encounter: Payer: Self-pay | Admitting: Internal Medicine

## 2016-04-23 VITALS — BP 128/80 | HR 69 | Ht 67.0 in | Wt 161.0 lb

## 2016-04-23 DIAGNOSIS — Z0000 Encounter for general adult medical examination without abnormal findings: Secondary | ICD-10-CM

## 2016-04-23 DIAGNOSIS — R7309 Other abnormal glucose: Secondary | ICD-10-CM

## 2016-04-23 DIAGNOSIS — R209 Unspecified disturbances of skin sensation: Secondary | ICD-10-CM | POA: Diagnosis not present

## 2016-04-23 DIAGNOSIS — M5440 Lumbago with sciatica, unspecified side: Secondary | ICD-10-CM | POA: Diagnosis not present

## 2016-04-23 DIAGNOSIS — E785 Hyperlipidemia, unspecified: Secondary | ICD-10-CM | POA: Diagnosis not present

## 2016-04-23 DIAGNOSIS — G8929 Other chronic pain: Secondary | ICD-10-CM

## 2016-04-23 DIAGNOSIS — F411 Generalized anxiety disorder: Secondary | ICD-10-CM

## 2016-04-23 MED ORDER — AMLODIPINE BESYLATE 5 MG PO TABS: 2.5000 mg | ORAL_TABLET | Freq: Every day | ORAL | 11 refills | Status: DC

## 2016-04-23 MED ORDER — MELOXICAM 15 MG PO TABS: 7.5000 mg | ORAL_TABLET | Freq: Every day | ORAL | 1 refills | Status: DC

## 2016-04-23 MED ORDER — PANTOPRAZOLE SODIUM 40 MG PO TBEC: DELAYED_RELEASE_TABLET | ORAL | 3 refills | Status: DC

## 2016-04-23 MED ORDER — ENALAPRIL MALEATE 5 MG PO TABS: 5.0000 mg | ORAL_TABLET | Freq: Two times a day (BID) | ORAL | 3 refills | Status: DC

## 2016-04-23 NOTE — Assessment & Plan Note (Signed)
Dr Vertell Limber On Lyrica qd

## 2016-04-23 NOTE — Assessment & Plan Note (Signed)
Labs

## 2016-04-23 NOTE — Patient Instructions (Signed)
Preventive Care for Adults, Female A healthy lifestyle and preventive care can promote health and wellness. Preventive health guidelines for women include the following key practices.  A routine yearly physical is a good way to check with your health care provider about your health and preventive screening. It is a chance to share any concerns and updates on your health and to receive a thorough exam.  Visit your dentist for a routine exam and preventive care every 6 months. Brush your teeth twice a day and floss once a day. Good oral hygiene prevents tooth decay and gum disease.  The frequency of eye exams is based on your age, health, family medical history, use of contact lenses, and other factors. Follow your health care provider's recommendations for frequency of eye exams.  Eat a healthy diet. Foods like vegetables, fruits, whole grains, low-fat dairy products, and lean protein foods contain the nutrients you need without too many calories. Decrease your intake of foods high in solid fats, added sugars, and salt. Eat the right amount of calories for you.Get information about a proper diet from your health care provider, if necessary.  Regular physical exercise is one of the most important things you can do for your health. Most adults should get at least 150 minutes of moderate-intensity exercise (any activity that increases your heart rate and causes you to sweat) each week. In addition, most adults need muscle-strengthening exercises on 2 or more days a week.  Maintain a healthy weight. The body mass index (BMI) is a screening tool to identify possible weight problems. It provides an estimate of body fat based on height and weight. Your health care provider can find your BMI and can help you achieve or maintain a healthy weight.For adults 20 years and older:  A BMI below 18.5 is considered underweight.  A BMI of 18.5 to 24.9 is normal.  A BMI of 25 to 29.9 is considered overweight.  A  BMI of 30 and above is considered obese.  Maintain normal blood lipids and cholesterol levels by exercising and minimizing your intake of saturated fat. Eat a balanced diet with plenty of fruit and vegetables. Blood tests for lipids and cholesterol should begin at age 45 and be repeated every 5 years. If your lipid or cholesterol levels are high, you are over 50, or you are at high risk for heart disease, you may need your cholesterol levels checked more frequently.Ongoing high lipid and cholesterol levels should be treated with medicines if diet and exercise are not working.  If you smoke, find out from your health care provider how to quit. If you do not use tobacco, do not start.  Lung cancer screening is recommended for adults aged 45-80 years who are at high risk for developing lung cancer because of a history of smoking. A yearly low-dose CT scan of the lungs is recommended for people who have at least a 30-pack-year history of smoking and are a current smoker or have quit within the past 15 years. A pack year of smoking is smoking an average of 1 pack of cigarettes a day for 1 year (for example: 1 pack a day for 30 years or 2 packs a day for 15 years). Yearly screening should continue until the smoker has stopped smoking for at least 15 years. Yearly screening should be stopped for people who develop a health problem that would prevent them from having lung cancer treatment.  If you are pregnant, do not drink alcohol. If you are  breastfeeding, be very cautious about drinking alcohol. If you are not pregnant and choose to drink alcohol, do not have more than 1 drink per day. One drink is considered to be 12 ounces (355 mL) of beer, 5 ounces (148 mL) of wine, or 1.5 ounces (44 mL) of liquor.  Avoid use of street drugs. Do not share needles with anyone. Ask for help if you need support or instructions about stopping the use of drugs.  High blood pressure causes heart disease and increases the risk  of stroke. Your blood pressure should be checked at least every 1 to 2 years. Ongoing high blood pressure should be treated with medicines if weight loss and exercise do not work.  If you are 43-23 years old, ask your health care provider if you should take aspirin to prevent strokes.  Diabetes screening is done by taking a blood sample to check your blood glucose level after you have not eaten for a certain period of time (fasting). If you are not overweight and you do not have risk factors for diabetes, you should be screened once every 3 years starting at age 45. If you are overweight or obese and you are 75-75 years of age, you should be screened for diabetes every year as part of your cardiovascular risk assessment.  Breast cancer screening is essential preventive care for women. You should practice "breast self-awareness." This means understanding the normal appearance and feel of your breasts and may include breast self-examination. Any changes detected, no matter how small, should be reported to a health care provider. Women in their 82s and 30s should have a clinical breast exam (CBE) by a health care provider as part of a regular health exam every 1 to 3 years. After age 85, women should have a CBE every year. Starting at age 18, women should consider having a mammogram (breast X-ray test) every year. Women who have a family history of breast cancer should talk to their health care provider about genetic screening. Women at a high risk of breast cancer should talk to their health care providers about having an MRI and a mammogram every year.  Breast cancer gene (BRCA)-related cancer risk assessment is recommended for women who have family members with BRCA-related cancers. BRCA-related cancers include breast, ovarian, tubal, and peritoneal cancers. Having family members with these cancers may be associated with an increased risk for harmful changes (mutations) in the breast cancer genes BRCA1 and  BRCA2. Results of the assessment will determine the need for genetic counseling and BRCA1 and BRCA2 testing.  Your health care provider may recommend that you be screened regularly for cancer of the pelvic organs (ovaries, uterus, and vagina). This screening involves a pelvic examination, including checking for microscopic changes to the surface of your cervix (Pap test). You may be encouraged to have this screening done every 3 years, beginning at age 77.  For women ages 84-65, health care providers may recommend pelvic exams and Pap testing every 3 years, or they may recommend the Pap and pelvic exam, combined with testing for human papilloma virus (HPV), every 5 years. Some types of HPV increase your risk of cervical cancer. Testing for HPV may also be done on women of any age with unclear Pap test results.  Other health care providers may not recommend any screening for nonpregnant women who are considered low risk for pelvic cancer and who do not have symptoms. Ask your health care provider if a screening pelvic exam is right for  you.  If you have had past treatment for cervical cancer or a condition that could lead to cancer, you need Pap tests and screening for cancer for at least 20 years after your treatment. If Pap tests have been discontinued, your risk factors (such as having a new sexual partner) need to be reassessed to determine if screening should resume. Some women have medical problems that increase the chance of getting cervical cancer. In these cases, your health care provider may recommend more frequent screening and Pap tests.  Colorectal cancer can be detected and often prevented. Most routine colorectal cancer screening begins at the age of 32 years and continues through age 47 years. However, your health care provider may recommend screening at an earlier age if you have risk factors for colon cancer. On a yearly basis, your health care provider may provide home test kits to check  for hidden blood in the stool. Use of a small camera at the end of a tube, to directly examine the colon (sigmoidoscopy or colonoscopy), can detect the earliest forms of colorectal cancer. Talk to your health care provider about this at age 87, when routine screening begins. Direct exam of the colon should be repeated every 5-10 years through age 54 years, unless early forms of precancerous polyps or small growths are found.  People who are at an increased risk for hepatitis B should be screened for this virus. You are considered at high risk for hepatitis B if:  You were born in a country where hepatitis B occurs often. Talk with your health care provider about which countries are considered high risk.  Your parents were born in a high-risk country and you have not received a shot to protect against hepatitis B (hepatitis B vaccine).  You have HIV or AIDS.  You use needles to inject street drugs.  You live with, or have sex with, someone who has hepatitis B.  You get hemodialysis treatment.  You take certain medicines for conditions like cancer, organ transplantation, and autoimmune conditions.  Hepatitis C blood testing is recommended for all people born from [birth year] through 1965 and any individual with known risks for hepatitis C.  Practice safe sex. Use condoms and avoid high-risk sexual practices to reduce the spread of sexually transmitted infections (STIs). STIs include gonorrhea, chlamydia, syphilis, trichomonas, herpes, HPV, and human immunodeficiency virus (HIV). Herpes, HIV, and HPV are viral illnesses that have no cure. They can result in disability, cancer, and death.  You should be screened for sexually transmitted illnesses (STIs) including gonorrhea and chlamydia if:  You are sexually active and are younger than 24 years.  You are older than 24 years and your health care provider tells you that you are at risk for this type of infection.  Your sexual activity has changed  since you were last screened and you are at an increased risk for chlamydia or gonorrhea. Ask your health care provider if you are at risk.  If you are at risk of being infected with HIV, it is recommended that you take a prescription medicine daily to prevent HIV infection. This is called preexposure prophylaxis (PrEP). You are considered at risk if:  You are sexually active and do not regularly use condoms or know the HIV status of your partner(s).  You take drugs by injection.  You are sexually active with a partner who has HIV.  Talk with your health care provider about whether you are at high risk of being infected with HIV. If  you choose to begin PrEP, you should first be tested for HIV. You should then be tested every 3 months for as long as you are taking PrEP.  Osteoporosis is a disease in which the bones lose minerals and strength with aging. This can result in serious bone fractures or breaks. The risk of osteoporosis can be identified using a bone density scan. Women ages 67 years and over and women at risk for fractures or osteoporosis should discuss screening with their health care providers. Ask your health care provider whether you should take a calcium supplement or vitamin D to reduce the rate of osteoporosis.  Menopause can be associated with physical symptoms and risks. Hormone replacement therapy is available to decrease symptoms and risks. You should talk to your health care provider about whether hormone replacement therapy is right for you.  Use sunscreen. Apply sunscreen liberally and repeatedly throughout the day. You should seek shade when your shadow is shorter than you. Protect yourself by wearing long sleeves, pants, a wide-brimmed hat, and sunglasses year round, whenever you are outdoors.  Once a month, do a whole body skin exam, using a mirror to look at the skin on your back. Tell your health care provider of new moles, moles that have irregular borders, moles that  are larger than a pencil eraser, or moles that have changed in shape or color.  Stay current with required vaccines (immunizations).  Influenza vaccine. All adults should be immunized every year.  Tetanus, diphtheria, and acellular pertussis (Td, Tdap) vaccine. Pregnant women should receive 1 dose of Tdap vaccine during each pregnancy. The dose should be obtained regardless of the length of time since the last dose. Immunization is preferred during the 27th-36th week of gestation. An adult who has not previously received Tdap or who does not know her vaccine status should receive 1 dose of Tdap. This initial dose should be followed by tetanus and diphtheria toxoids (Td) booster doses every 10 years. Adults with an unknown or incomplete history of completing a 3-dose immunization series with Td-containing vaccines should begin or complete a primary immunization series including a Tdap dose. Adults should receive a Td booster every 10 years.  Varicella vaccine. An adult without evidence of immunity to varicella should receive 2 doses or a second dose if she has previously received 1 dose. Pregnant females who do not have evidence of immunity should receive the first dose after pregnancy. This first dose should be obtained before leaving the health care facility. The second dose should be obtained 4-8 weeks after the first dose.  Human papillomavirus (HPV) vaccine. Females aged 13-26 years who have not received the vaccine previously should obtain the 3-dose series. The vaccine is not recommended for use in pregnant females. However, pregnancy testing is not needed before receiving a dose. If a female is found to be pregnant after receiving a dose, no treatment is needed. In that case, the remaining doses should be delayed until after the pregnancy. Immunization is recommended for any person with an immunocompromised condition through the age of 61 years if she did not get any or all doses earlier. During the  3-dose series, the second dose should be obtained 4-8 weeks after the first dose. The third dose should be obtained 24 weeks after the first dose and 16 weeks after the second dose.  Zoster vaccine. One dose is recommended for adults aged 30 years or older unless certain conditions are present.  Measles, mumps, and rubella (MMR) vaccine. Adults born  before 1957 generally are considered immune to measles and mumps. Adults born in 1957 or later should have 1 or more doses of MMR vaccine unless there is a contraindication to the vaccine or there is laboratory evidence of immunity to each of the three diseases. A routine second dose of MMR vaccine should be obtained at least 28 days after the first dose for students attending postsecondary schools, health care workers, or international travelers. People who received inactivated measles vaccine or an unknown type of measles vaccine during 1963-1967 should receive 2 doses of MMR vaccine. People who received inactivated mumps vaccine or an unknown type of mumps vaccine before 1979 and are at high risk for mumps infection should consider immunization with 2 doses of MMR vaccine. For females of childbearing age, rubella immunity should be determined. If there is no evidence of immunity, females who are not pregnant should be vaccinated. If there is no evidence of immunity, females who are pregnant should delay immunization until after pregnancy. Unvaccinated health care workers born before 1957 who lack laboratory evidence of measles, mumps, or rubella immunity or laboratory confirmation of disease should consider measles and mumps immunization with 2 doses of MMR vaccine or rubella immunization with 1 dose of MMR vaccine.  Pneumococcal 13-valent conjugate (PCV13) vaccine. When indicated, a person who is uncertain of his immunization history and has no record of immunization should receive the PCV13 vaccine. All adults 65 years of age and older should receive this  vaccine. An adult aged 19 years or older who has certain medical conditions and has not been previously immunized should receive 1 dose of PCV13 vaccine. This PCV13 should be followed with a dose of pneumococcal polysaccharide (PPSV23) vaccine. Adults who are at high risk for pneumococcal disease should obtain the PPSV23 vaccine at least 8 weeks after the dose of PCV13 vaccine. Adults older than 69 years of age who have normal immune system function should obtain the PPSV23 vaccine dose at least 1 year after the dose of PCV13 vaccine.  Pneumococcal polysaccharide (PPSV23) vaccine. When PCV13 is also indicated, PCV13 should be obtained first. All adults aged 65 years and older should be immunized. An adult younger than age 65 years who has certain medical conditions should be immunized. Any person who resides in a nursing home or long-term care facility should be immunized. An adult smoker should be immunized. People with an immunocompromised condition and certain other conditions should receive both PCV13 and PPSV23 vaccines. People with human immunodeficiency virus (HIV) infection should be immunized as soon as possible after diagnosis. Immunization during chemotherapy or radiation therapy should be avoided. Routine use of PPSV23 vaccine is not recommended for American Indians, [place] Natives, or people younger than 65 years unless there are medical conditions that require PPSV23 vaccine. When indicated, people who have unknown immunization and have no record of immunization should receive PPSV23 vaccine. One-time revaccination 5 years after the first dose of PPSV23 is recommended for people aged 19-64 years who have chronic kidney failure, nephrotic syndrome, asplenia, or immunocompromised conditions. People who received 1-2 doses of PPSV23 before age 65 years should receive another dose of PPSV23 vaccine at age 65 years or later if at least 5 years have passed since the previous dose. Doses of PPSV23 are not  needed for people immunized with PPSV23 at or after age 65 years.  Meningococcal vaccine. Adults with asplenia or persistent complement component deficiencies should receive 2 doses of quadrivalent meningococcal conjugate (MenACWY-D) vaccine. The doses should be obtained   at least 2 months apart. Microbiologists working with certain meningococcal bacteria, [place] recruits, people at risk during an outbreak, and people who travel to or live in countries with a high rate of meningitis should be immunized. A first-year college student up through age 34 years who is living in a residence hall should receive a dose if she did not receive a dose on or after her 16th birthday. Adults who have certain high-risk conditions should receive one or more doses of vaccine.  Hepatitis A vaccine. Adults who wish to be protected from this disease, have certain high-risk conditions, work with hepatitis A-infected animals, work in hepatitis A research labs, or travel to or work in countries with a high rate of hepatitis A should be immunized. Adults who were previously unvaccinated and who anticipate close contact with an international adoptee during the first 60 days after arrival in the [place] from a country with a high rate of hepatitis A should be immunized.  Hepatitis B vaccine. Adults who wish to be protected from this disease, have certain high-risk conditions, may be exposed to blood or other infectious body fluids, are household contacts or sex partners of hepatitis B positive people, are clients or workers in certain care facilities, or travel to or work in countries with a high rate of hepatitis B should be immunized.  Haemophilus influenzae type b (Hib) vaccine. A previously unvaccinated person with asplenia or sickle cell disease or having a scheduled splenectomy should receive 1 dose of Hib vaccine. Regardless of previous immunization, a recipient of a hematopoietic stem cell transplant should receive a  3-dose series 6-12 months after her successful transplant. Hib vaccine is not recommended for adults with HIV infection. Preventive Services / Frequency Ages 35 to 4 years  Blood pressure check.** / Every 3-5 years.  Lipid and cholesterol check.** / Every 5 years beginning at age 60.  Clinical breast exam.** / Every 3 years for women in their 71s and 10s.  BRCA-related cancer risk assessment.** / For women who have family members with a BRCA-related cancer (breast, ovarian, tubal, or peritoneal cancers).  Pap test.** / Every 2 years from ages 76 through 26. Every 3 years starting at age 61 through age 76 or 93 with a history of 3 consecutive normal Pap tests.  HPV screening.** / Every 3 years from ages 37 through ages 60 to 51 with a history of 3 consecutive normal Pap tests.  Hepatitis C blood test.** / For any individual with known risks for hepatitis C.  Skin self-exam. / Monthly.  Influenza vaccine. / Every year.  Tetanus, diphtheria, and acellular pertussis (Tdap, Td) vaccine.** / Consult your health care provider. Pregnant women should receive 1 dose of Tdap vaccine during each pregnancy. 1 dose of Td every 10 years.  Varicella vaccine.** / Consult your health care provider. Pregnant females who do not have evidence of immunity should receive the first dose after pregnancy.  HPV vaccine. / 3 doses over 6 months, if 93 and younger. The vaccine is not recommended for use in pregnant females. However, pregnancy testing is not needed before receiving a dose.  Measles, mumps, rubella (MMR) vaccine.** / You need at least 1 dose of MMR if you were born in 1957 or later. You may also need a 2nd dose. For females of childbearing age, rubella immunity should be determined. If there is no evidence of immunity, females who are not pregnant should be vaccinated. If there is no evidence of immunity, females who are  pregnant should delay immunization until after pregnancy.  Pneumococcal  13-valent conjugate (PCV13) vaccine.** / Consult your health care provider.  Pneumococcal polysaccharide (PPSV23) vaccine.** / 1 to 2 doses if you smoke cigarettes or if you have certain conditions.  Meningococcal vaccine.** / 1 dose if you are age 68 to 8 years and a Market researcher living in a residence hall, or have one of several medical conditions, you need to get vaccinated against meningococcal disease. You may also need additional booster doses.  Hepatitis A vaccine.** / Consult your health care provider.  Hepatitis B vaccine.** / Consult your health care provider.  Haemophilus influenzae type b (Hib) vaccine.** / Consult your health care provider. Ages 7 to 53 years  Blood pressure check.** / Every year.  Lipid and cholesterol check.** / Every 5 years beginning at age 25 years.  Lung cancer screening. / Every year if you are aged 11-80 years and have a 30-pack-year history of smoking and currently smoke or have quit within the past 15 years. Yearly screening is stopped once you have quit smoking for at least 15 years or develop a health problem that would prevent you from having lung cancer treatment.  Clinical breast exam.** / Every year after age 48 years.  BRCA-related cancer risk assessment.** / For women who have family members with a BRCA-related cancer (breast, ovarian, tubal, or peritoneal cancers).  Mammogram.** / Every year beginning at age 41 years and continuing for as long as you are in good health. Consult with your health care provider.  Pap test.** / Every 3 years starting at age 65 years through age 37 or 70 years with a history of 3 consecutive normal Pap tests.  HPV screening.** / Every 3 years from ages 72 years through ages 60 to 40 years with a history of 3 consecutive normal Pap tests.  Fecal occult blood test (FOBT) of stool. / Every year beginning at age 21 years and continuing until age 5 years. You may not need to do this test if you get  a colonoscopy every 10 years.  Flexible sigmoidoscopy or colonoscopy.** / Every 5 years for a flexible sigmoidoscopy or every 10 years for a colonoscopy beginning at age 35 years and continuing until age 48 years.  Hepatitis C blood test.** / For all people born from [birth year] through 1964 and any individual with known risks for hepatitis C.  Skin self-exam. / Monthly.  Influenza vaccine. / Every year.  Tetanus, diphtheria, and acellular pertussis (Tdap/Td) vaccine.** / Consult your health care provider. Pregnant women should receive 1 dose of Tdap vaccine during each pregnancy. 1 dose of Td every 10 years.  Varicella vaccine.** / Consult your health care provider. Pregnant females who do not have evidence of immunity should receive the first dose after pregnancy.  Zoster vaccine.** / 1 dose for adults aged 30 years or older.  Measles, mumps, rubella (MMR) vaccine.** / You need at least 1 dose of MMR if you were born in 1957 or later. You may also need a second dose. For females of childbearing age, rubella immunity should be determined. If there is no evidence of immunity, females who are not pregnant should be vaccinated. If there is no evidence of immunity, females who are pregnant should delay immunization until after pregnancy.  Pneumococcal 13-valent conjugate (PCV13) vaccine.** / Consult your health care provider.  Pneumococcal polysaccharide (PPSV23) vaccine.** / 1 to 2 doses if you smoke cigarettes or if you have certain conditions.  Meningococcal vaccine.** /  Consult your health care provider.  Hepatitis A vaccine.** / Consult your health care provider.  Hepatitis B vaccine.** / Consult your health care provider.  Haemophilus influenzae type b (Hib) vaccine.** / Consult your health care provider. Ages 64 years and over  Blood pressure check.** / Every year.  Lipid and cholesterol check.** / Every 5 years beginning at age 23 years.  Lung cancer screening. / Every year if you  are aged 16-80 years and have a 30-pack-year history of smoking and currently smoke or have quit within the past 15 years. Yearly screening is stopped once you have quit smoking for at least 15 years or develop a health problem that would prevent you from having lung cancer treatment.  Clinical breast exam.** / Every year after age 74 years.  BRCA-related cancer risk assessment.** / For women who have family members with a BRCA-related cancer (breast, ovarian, tubal, or peritoneal cancers).  Mammogram.** / Every year beginning at age 44 years and continuing for as long as you are in good health. Consult with your health care provider.  Pap test.** / Every 3 years starting at age 58 years through age 22 or 39 years with 3 consecutive normal Pap tests. Testing can be stopped between 65 and 70 years with 3 consecutive normal Pap tests and no abnormal Pap or HPV tests in the past 10 years.  HPV screening.** / Every 3 years from ages 64 years through ages 70 or 61 years with a history of 3 consecutive normal Pap tests. Testing can be stopped between 65 and 70 years with 3 consecutive normal Pap tests and no abnormal Pap or HPV tests in the past 10 years.  Fecal occult blood test (FOBT) of stool. / Every year beginning at age 40 years and continuing until age 27 years. You may not need to do this test if you get a colonoscopy every 10 years.  Flexible sigmoidoscopy or colonoscopy.** / Every 5 years for a flexible sigmoidoscopy or every 10 years for a colonoscopy beginning at age 7 years and continuing until age 32 years.  Hepatitis C blood test.** / For all people born from [birth year] through 1964 and any individual with known risks for hepatitis C.  Osteoporosis screening.** / A one-time screening for women ages 30 years and over and women at risk for fractures or osteoporosis.  Skin self-exam. / Monthly.  Influenza vaccine. / Every year.  Tetanus, diphtheria, and acellular pertussis (Tdap/Td)  vaccine.** / 1 dose of Td every 10 years.  Varicella vaccine.** / Consult your health care provider.  Zoster vaccine.** / 1 dose for adults aged 35 years or older.  Pneumococcal 13-valent conjugate (PCV13) vaccine.** / Consult your health care provider.  Pneumococcal polysaccharide (PPSV23) vaccine.** / 1 dose for all adults aged 46 years and older.  Meningococcal vaccine.** / Consult your health care provider.  Hepatitis A vaccine.** / Consult your health care provider.  Hepatitis B vaccine.** / Consult your health care provider.  Haemophilus influenzae type b (Hib) vaccine.** / Consult your health care provider. ** Family history and personal history of risk and conditions may change your health care provider's recommendations.   This information is not intended to replace advice given to you by your health care provider. Make sure you discuss any questions you have with your health care provider.   Document Released: 08/11/2001 Document Revised: 07/06/2014 Document Reviewed: 11/10/2010 Elsevier Interactive Patient Education [employer].

## 2016-04-23 NOTE — Assessment & Plan Note (Addendum)
?  etiology- poss ulnar neuropathy  "electric current sensation" in the L arm f/u: Dr Vertell Limber has checked it out. She had a C spine MRI Arterial doppler was suggested by someone Neurology ref PT if needed

## 2016-04-23 NOTE — Assessment & Plan Note (Signed)
Here for medicare wellness/physical  Diet: heart healthy  Physical activity: not sedentary  Depression/mood screen: negative  Hearing: intact to whispered voice  Visual acuity: grossly normal, performs annual eye exam  ADLs: capable  Fall risk: low Home safety: good  Cognitive evaluation: intact to orientation, naming, recall and repetition  EOL planning: adv directives, full code/ I agree  I have personally reviewed and have noted  1. The patient's medical and social history  2. Their use of alcohol, tobacco or illicit drugs  3. Their current medications and supplements  4. The patient's functional ability including ADL's, fall risks, home safety risks and hearing or visual impairment.  5. Diet and physical activities  6. Evidence for depression or mood disorders    Today patient counseled on age appropriate routine health concerns for screening and prevention, each reviewed and up to date or declined. Immunizations reviewed and up to date or declined. Labs ordered and reviewed. Risk factors for depression reviewed and negative. Hearing function and visual acuity are intact. ADLs screened and addressed as needed. Functional ability and level of safety reviewed and appropriate. Education, counseling and referrals performed based on assessed risks today. Patient provided with a copy of personalized plan for preventive services.       

## 2016-04-23 NOTE — Assessment & Plan Note (Signed)
Chronic  Potential benefits of a long term benzodiazepines  use as well as potential risks  and complications were explained to the patient and were aknowledged. 

## 2016-04-23 NOTE — Progress Notes (Signed)
Pre visit review using our clinic review tool, if applicable. No additional management support is needed unless otherwise documented below in the visit note. 

## 2016-04-23 NOTE — Progress Notes (Signed)
Subjective:  Patient ID: Haley Armstrong, female    DOB: [birth year]  Age: 70 y.o. MRN: [id number]  CC: No chief complaint on file.   HPI Haley Armstrong presents for a well exam.  F/u LBP, HTN, anxiety C/o "electric current sensation" in the L arm down to fingers #4-5 x1 year f/u: Dr Vertell Limber has checked it out w/cervical MRI  Outpatient Medications Prior to Visit  Medication Sig Dispense Refill  . AMBULATORY NON FORMULARY MEDICATION Medication Name: Mylanta and Lidocaine   Take 30-60 cc by mouth twice a day as needed 30 application 0  . amLODipine (NORVASC) 5 MG tablet Take 0.5 tablets (2.5 mg total) by mouth daily. Must keep Oct appt for future refills 15 tablet 0  . APAP-Isometheptene-Dichloral (EPIDRIN) 325-65-100 MG CAPS Take by mouth 2 (two) times daily as needed.      Marland Kitchen aspirin 81 MG EC tablet Take 81 mg by mouth 2 (two) times daily.      Marland Kitchen b complex vitamins tablet Take 1 tablet by mouth daily.      . Cholecalciferol 1000 UNITS tablet Take 1,000 Units by mouth daily.      . Diclofenac Sodium (PENNSAID) 1.5 % SOLN APPLY 3 TO 5 DROPS ON SKIN THREE TIMES A DAY FOR PAIN 150 mL 2  . diclofenac sodium (VOLTAREN) 1 % GEL Apply 4 g topically 4 (four) times daily. 100 Tube 11  . enalapril (VASOTEC) 5 MG tablet TAKE ONE (1) TABLET BY MOUTH TWO (2) TIMES DAILY 180 tablet 1  . hyoscyamine (LEVBID) 0.375 MG 12 hr tablet TAKE ONE (1) TABLET BY MOUTH EVERY 12 HOURS AS NEEDED 60 tablet 1  . hyoscyamine (LEVSIN SL) 0.125 MG SL tablet USE ONE TO TWO TABLETS SUBLINGUALLY EVERY 4 HOURS AS NEEDED FOR PAIN 30 tablet 3  . LORazepam (ATIVAN) 0.5 MG tablet TAKE ONE (1) TABLET BY MOUTH 3 TIMES DAILY AS NEEDED BETWEEN MEALS AND BEDTIME FOR ANXIETY 30 tablet 5  . meloxicam (MOBIC) 15 MG tablet TAKE 1/2 TO 1 TABLET BY MOUTH DAILY 90 tablet 1  . methocarbamol (ROBAXIN) 500 MG tablet TAKE ONE (1) TABLET BY MOUTH FOUR (4) TIMES DAILY AS NEEDED 120 tablet 0  . Methylcellulose, Laxative, (CITRUCEL PO) Take by  mouth. One once daily     . Multiple Vitamins-Minerals (MULTIVITAMIN,TX-MINERALS) tablet Take 1 tablet by mouth daily.      . Omega 3 1000 MG CAPS Take 2 capsules by mouth daily.    . pantoprazole (PROTONIX) 40 MG tablet TAKE ONE (1) TABLET BY MOUTH TWO (2) TIMES DAILY 180 tablet 3  . pregabalin (LYRICA) 150 MG capsule Take 1 capsule (150 mg total) by mouth 2 (two) times daily. 60 capsule 3  . Sennosides (SENNA LAX PO) Take by mouth. As needed      Facility-Administered Medications Prior to Visit  Medication Dose Route Frequency Provider Last Rate Last Dose  . 0.9 %  sodium chloride infusion  500 mL Intravenous Continuous Irene Shipper, MD        ROS Review of Systems  Constitutional: Negative for activity change, appetite change, chills, fatigue and unexpected weight change.  HENT: Negative for congestion, mouth sores and sinus pressure.   Eyes: Negative for visual disturbance.  Respiratory: Negative for cough and chest tightness.   Gastrointestinal: Negative for abdominal pain and nausea.  Genitourinary: Negative for difficulty urinating, frequency and vaginal pain.  Musculoskeletal: Positive for arthralgias and back pain. Negative for gait problem.  Skin: Negative  for pallor and rash.  Neurological: Positive for numbness. Negative for dizziness, tremors, weakness and headaches.  Psychiatric/Behavioral: Negative for confusion and sleep disturbance. The patient is nervous/anxious.     Objective:  BP 128/80   Pulse 69   Ht 5\' 7"  (1.702 m)   Wt 161 lb (73 kg)   SpO2 98%   BMI 25.22 kg/m   BP Readings from Last 3 Encounters:  04/23/16 128/80  03/05/16 132/70  02/28/16 (!) 130/58    Wt Readings from Last 3 Encounters:  04/23/16 161 lb (73 kg)  03/05/16 156 lb 4 oz (70.9 kg)  02/28/16 159 lb (72.1 kg)    Physical Exam  Constitutional: She appears well-developed. No distress.  HENT:  Head: Normocephalic.  Right Ear: External ear normal.  Left Ear: External ear normal.    Nose: Nose normal.  Mouth/Throat: Oropharynx is clear and moist.  Eyes: Conjunctivae are normal. Pupils are equal, round, and reactive to light. Right eye exhibits no discharge. Left eye exhibits no discharge.  Neck: Normal range of motion. Neck supple. No JVD present. No tracheal deviation present. No thyromegaly present.  Cardiovascular: Normal rate, regular rhythm and normal heart sounds.   Pulmonary/Chest: No stridor. No respiratory distress. She has no wheezes.  Abdominal: Soft. Bowel sounds are normal. She exhibits no distension and no mass. There is no tenderness. There is no rebound and no guarding.  Musculoskeletal: She exhibits no edema or tenderness.  Lymphadenopathy:    She has no cervical adenopathy.  Neurological: She displays normal reflexes. No cranial nerve deficit. She exhibits normal muscle tone. Coordination normal.  Skin: No rash noted. No erythema.  Psychiatric: She has a normal mood and affect. Her behavior is normal. Judgment and thought content normal.  good pulses B UEs MS/grip WNL B  Lab Results  Component Value Date   WBC 6.1 04/18/2015   HGB 13.4 04/18/2015   HCT 40.3 04/18/2015   PLT 235.0 04/18/2015   GLUCOSE 106 (H) 04/18/2015   CHOL 187 04/25/2014   TRIG 55.0 04/25/2014   HDL 57.70 04/25/2014   LDLCALC 118 (H) 04/25/2014   ALT 23 04/18/2015   AST 26 04/18/2015   NA 141 04/18/2015   K 5.1 04/18/2015   CL 105 04/18/2015   CREATININE 0.90 04/18/2015   BUN 21 04/18/2015   CO2 31 04/18/2015   TSH 2.87 04/25/2014   INR 0.90 08/05/2009   HGBA1C 5.7 05/20/2007    Ct Cardiac Scoring  Addendum Date: 06/27/2015   ADDENDUM REPORT: 06/27/2015 13:27 EXAM: OVER-READ INTERPRETATION  CT CHEST The following report is an over-read performed by radiologist Dr. Julian Hy of [facility] on 06/27/2015. This over-read does not include interpretation of cardiac or coronary anatomy or pathology. The coronary calcium score interpretation by the  cardiologist is attached. COMPARISON:  None. FINDINGS: Mild linear scarring in the right middle lobe. No focal consolidation. No suspicious pulmonary nodules. No suspicious mediastinal lymphadenopathy. Visualized upper abdomen is unremarkable. Degenerative changes of the visualized thoracolumbar spine. IMPRESSION: No significant extracardiac findings. Electronically Signed   By: Julian Hy M.D.   On: 06/27/2015 13:27  Result Date: 06/27/2015 CLINICAL DATA:  Risk stratification EXAM: Coronary Calcium Score TECHNIQUE: The patient was scanned on a Siemens Sensation 16 slice scanner. Axial non-contrast 61mm slices were carried out through the heart. The data set was analyzed on a dedicated work station and scored using the [place]. FINDINGS: Non-cardiac: No significant non cardiac findings on limited lung and soft tissue windows.  See separate report from [facility]. Ascending Aorta:  29 mm Pericardium: Normal Coronary arteries:  No calcium detected IMPRESSION: Coronary calcium score of 0 . Jenkins Rouge Electronically Signed: By: Jenkins Rouge M.D. On: 06/27/2015 11:11    Assessment & Plan:   There are no diagnoses linked to this encounter. I am having Ms. Woehl maintain her aspirin, b complex vitamins, EPIDRIN, (multivitamin,tx-minerals), Cholecalciferol, (Methylcellulose, Laxative, (CITRUCEL PO)), Sennosides (SENNA LAX PO), Diclofenac Sodium, pregabalin, AMBULATORY NON FORMULARY MEDICATION, pantoprazole, LORazepam, hyoscyamine, enalapril, meloxicam, diclofenac sodium, hyoscyamine, Omega 3, amLODipine, and methocarbamol. We will continue to administer sodium chloride.  No orders of the defined types were placed in this encounter.    Follow-up: No Follow-up on file.  Walker Kehr, MD

## 2016-05-08 ENCOUNTER — Other Ambulatory Visit (INDEPENDENT_AMBULATORY_CARE_PROVIDER_SITE_OTHER): Payer: Medicare Other

## 2016-05-08 DIAGNOSIS — L821 Other seborrheic keratosis: Secondary | ICD-10-CM | POA: Diagnosis not present

## 2016-05-08 DIAGNOSIS — R7309 Other abnormal glucose: Secondary | ICD-10-CM

## 2016-05-08 DIAGNOSIS — L918 Other hypertrophic disorders of the skin: Secondary | ICD-10-CM | POA: Diagnosis not present

## 2016-05-08 DIAGNOSIS — X32XXXD Exposure to sunlight, subsequent encounter: Secondary | ICD-10-CM | POA: Diagnosis not present

## 2016-05-08 DIAGNOSIS — Z0000 Encounter for general adult medical examination without abnormal findings: Secondary | ICD-10-CM | POA: Diagnosis not present

## 2016-05-08 DIAGNOSIS — F411 Generalized anxiety disorder: Secondary | ICD-10-CM

## 2016-05-08 DIAGNOSIS — D045 Carcinoma in situ of skin of trunk: Secondary | ICD-10-CM | POA: Diagnosis not present

## 2016-05-08 DIAGNOSIS — E785 Hyperlipidemia, unspecified: Secondary | ICD-10-CM | POA: Diagnosis not present

## 2016-05-08 DIAGNOSIS — L570 Actinic keratosis: Secondary | ICD-10-CM | POA: Diagnosis not present

## 2016-05-08 DIAGNOSIS — R209 Unspecified disturbances of skin sensation: Secondary | ICD-10-CM

## 2016-05-08 LAB — BASIC METABOLIC PANEL
BUN: 21 mg/dL (ref 6–23)
CO2: 28 mEq/L (ref 19–32)
Calcium: 9.3 mg/dL (ref 8.4–10.5)
Chloride: 105 mEq/L (ref 96–112)
Creatinine, Ser: 0.98 mg/dL (ref 0.40–1.20)
GFR: 59.73 mL/min — AB (ref >60.00)
Glucose, Bld: 103 mg/dL — ABNORMAL HIGH (ref 70–99)
POTASSIUM: 4.8 meq/L (ref 3.5–5.1)
Sodium: 141 mEq/L (ref 135–145)

## 2016-05-08 LAB — CBC WITH DIFFERENTIAL/PLATELET
Basophils Absolute: 0.1 10*3/uL (ref 0.0–0.1)
Basophils Relative: 1 % (ref 0.0–3.0)
EOS ABS: 0.3 10*3/uL (ref 0.0–0.7)
EOS PCT: 5.4 % — AB (ref 0.0–5.0)
HCT: 41.4 % (ref 36.0–46.0)
HEMOGLOBIN: 14.2 g/dL (ref 12.0–15.0)
LYMPHS PCT: 37.7 % (ref 12.0–46.0)
Lymphs Abs: 1.9 10*3/uL (ref 0.7–4.0)
MCHC: 34.3 g/dL (ref 30.0–36.0)
MCV: 93.3 fl (ref 78.0–100.0)
MONO ABS: 0.7 10*3/uL (ref 0.1–1.0)
Monocytes Relative: 12.8 % — ABNORMAL HIGH (ref 3.0–12.0)
Neutro Abs: 2.2 10*3/uL (ref 1.4–7.7)
Neutrophils Relative %: 43.1 % (ref 43.0–77.0)
Platelets: 264 10*3/uL (ref 150.0–400.0)
RBC: 4.43 Mil/uL (ref 3.87–5.11)
RDW: 13.5 % (ref 11.5–15.5)
WBC: 5.1 10*3/uL (ref 4.0–10.5)

## 2016-05-08 LAB — URINALYSIS
Bilirubin Urine: NEGATIVE
Hgb urine dipstick: NEGATIVE
KETONES UR: NEGATIVE
LEUKOCYTES UA: NEGATIVE
NITRITE: NEGATIVE
SPECIFIC GRAVITY, URINE: 1.01 (ref 1.000–1.030)
Total Protein, Urine: NEGATIVE
UROBILINOGEN UA: 0.2 (ref 0.0–1.0)
Urine Glucose: NEGATIVE
pH: 6 (ref 5.0–8.0)

## 2016-05-08 LAB — HEPATIC FUNCTION PANEL
ALBUMIN: 4.3 g/dL (ref 3.5–5.2)
ALK PHOS: 68 U/L (ref 39–117)
ALT: 23 U/L (ref 0–35)
AST: 28 U/L (ref 0–37)
Bilirubin, Direct: 0 mg/dL (ref 0.0–0.3)
TOTAL PROTEIN: 7.2 g/dL (ref 6.0–8.3)
Total Bilirubin: 0.3 mg/dL (ref 0.2–1.2)

## 2016-05-08 LAB — TSH: TSH: 4.74 u[IU]/mL — ABNORMAL HIGH (ref 0.35–4.50)

## 2016-05-08 LAB — LIPID PANEL
CHOLESTEROL: 195 mg/dL (ref 0–200)
HDL: 58.4 mg/dL (ref >39.00)
LDL Cholesterol: 121 mg/dL — ABNORMAL HIGH (ref 0–99)
NONHDL: 136.86
TRIGLYCERIDES: 80 mg/dL (ref 0.0–149.0)
Total CHOL/HDL Ratio: 3
VLDL: 16 mg/dL (ref 0.0–40.0)

## 2016-05-08 LAB — HEMOGLOBIN A1C: HEMOGLOBIN A1C: 5.8 % (ref 4.6–6.5)

## 2016-05-09 LAB — HEPATITIS C ANTIBODY: HCV Ab: NEGATIVE

## 2016-05-10 ENCOUNTER — Other Ambulatory Visit: Payer: Self-pay | Admitting: Internal Medicine

## 2016-05-10 DIAGNOSIS — R7989 Other specified abnormal findings of blood chemistry: Secondary | ICD-10-CM

## 2016-05-14 DIAGNOSIS — M25511 Pain in right shoulder: Secondary | ICD-10-CM | POA: Diagnosis not present

## 2016-05-14 DIAGNOSIS — M546 Pain in thoracic spine: Secondary | ICD-10-CM | POA: Diagnosis not present

## 2016-05-25 ENCOUNTER — Telehealth: Payer: Self-pay | Admitting: Cardiology

## 2016-05-25 NOTE — Telephone Encounter (Signed)
Patient states that within the last couple weeks she has started to get the chest discomfort she had last year when she saw Dr. Radford Pax. She reports the discomfort occurs mostly around eating times and actually improves with rest and lying down. It is worst at night during dinner and resolves by morning. She states the pain does not radiate down her arms, but she does have tightness in her shoulders. She reports that sometimes it feels like her "chest is about to explode." She denies SOB, palpitations, swelling. She reports she is under a significant amount of stress, and she has been drinking "[place]" every day for weeks. She is concerned she has a "bad ulcer." Offered patient earlier OV than next week, but she refused. She states she will call her GI doctor and see if there is anything that can be done. Instructed patient to try to follow a bland diet and to avoid spicy foods, coffee, acidic foods, and alcohol. She understands to call if symptoms worsen prior to OV or to go to ED if other symptoms occur with CP such as SOB, sweating, N&V.  She understands she will be called if Dr. Radford Pax has any recommendations prior to OV next week. She was grateful for call.

## 2016-05-25 NOTE — Telephone Encounter (Signed)
Left message to call back  

## 2016-05-25 NOTE — Telephone Encounter (Signed)
Pt returned call to Sammuel Bailiff @ [phone]

## 2016-05-25 NOTE — Telephone Encounter (Signed)
Pt having discomfort in chest during the evenings, some pressure, she is taking med from when she had an ulcer in case that's what it is-also has fibrocystic R breast and that causes some pain-has mammogram same day as [facility] appt , but thinks she needs a sooner appt with [facility]  -pls advise [phone]

## 2016-05-25 NOTE — Telephone Encounter (Signed)
Please get her in to see her PCP tomorrow

## 2016-05-26 NOTE — Telephone Encounter (Signed)
I spoke with patient, relayed Dr. Theodosia Blender message.  Pt refused appt today. She states she will go Thursday.  At patient's request I called Dr. Judeen Hammans office and made appt with his NP Baldo Ash for Thursday @ 0800 (Dr Alain Marion did not have availability).  I called patient back and advised her of appt info.  I reviewed medication list and GERD diet restrictions. I also advised her to hold Meloxicam until Thursday's appt. I advised her to take her medication with her to appt and let Baldo Ash know exactly how she has been taking everything.  Pt admits not taking medications as prescribed.  Pt voiced understanding of all instruction, ED precautions, and appt info-she agreed with plan.

## 2016-05-28 ENCOUNTER — Ambulatory Visit (INDEPENDENT_AMBULATORY_CARE_PROVIDER_SITE_OTHER): Payer: Medicare Other | Admitting: Nurse Practitioner

## 2016-05-28 ENCOUNTER — Encounter: Payer: Self-pay | Admitting: Nurse Practitioner

## 2016-05-28 ENCOUNTER — Ambulatory Visit (INDEPENDENT_AMBULATORY_CARE_PROVIDER_SITE_OTHER)
Admission: RE | Admit: 2016-05-28 | Discharge: 2016-05-28 | Disposition: A | Discharge status: Home / Self Care | Payer: Medicare Other | Source: Ambulatory Visit | Attending: Nurse Practitioner | Admitting: Nurse Practitioner

## 2016-05-28 VITALS — BP 138/82 | HR 81 | Temp 97.9°F | Wt 160.0 lb

## 2016-05-28 DIAGNOSIS — R0789 Other chest pain: Secondary | ICD-10-CM

## 2016-05-28 NOTE — Progress Notes (Signed)
Pre visit review using our clinic review tool, if applicable. No additional management support is needed unless otherwise documented below in the visit note. 

## 2016-05-28 NOTE — Patient Instructions (Addendum)
Normal ECG, no change from 2016  No acute finding on CXR.  maintain upcoming appt with cardiologist. Go to ED if symptoms worsen

## 2016-05-28 NOTE — Progress Notes (Signed)
Subjective:  Patient ID: Haley Armstrong, female    DOB: [birth year]  Age: 70 y.o. MRN: [id number]  CC: Chest Pain  Chest Pain   This is a recurrent problem. The current episode started 1 to 4 weeks ago. The onset quality is gradual. The problem occurs intermittently. The problem has been waxing and waning. The pain radiates to the precordial region. Pertinent negatives include no abdominal pain, back pain, claudication, cough, diaphoresis, dizziness, exertional chest pressure, fever, headaches, irregular heartbeat, lower extremity edema, malaise/fatigue, nausea, near-syncope, numbness, orthopnea, palpitations, PND, shortness of breath, syncope or weakness. The pain is aggravated by nothing. She has tried nothing for the symptoms. Risk factors include being elderly and post-menopausal.  Her past medical history is significant for anxiety/panic attacks. Prior diagnostic workup includes stress echo (evaluation by cardiologist 2016 per patient).  has appt with Dr. Jamas Lav 06/04/16. Has not been taking BP and GERD medications as prescribed. She resumed medications as prescribed 2days ago.  Outpatient Medications Prior to Visit  Medication Sig Dispense Refill  . AMBULATORY NON FORMULARY MEDICATION Medication Name: Mylanta and Lidocaine   Take 30-60 cc by mouth twice a day as needed 30 application 0  . amLODipine (NORVASC) 5 MG tablet Take 0.5 tablets (2.5 mg total) by mouth daily. 45 tablet 11  . APAP-Isometheptene-Dichloral (EPIDRIN) 325-65-100 MG CAPS Take by mouth 2 (two) times daily as needed.      Marland Kitchen aspirin 81 MG EC tablet Take 81 mg by mouth 2 (two) times daily.      Marland Kitchen b complex vitamins tablet Take 1 tablet by mouth daily.      . Cholecalciferol 1000 UNITS tablet Take 1,000 Units by mouth daily.      . Diclofenac Sodium (PENNSAID) 1.5 % SOLN APPLY 3 TO 5 DROPS ON SKIN THREE TIMES A DAY FOR PAIN 150 mL 2  . diclofenac sodium (VOLTAREN) 1 % GEL Apply 4 g topically 4 (four) times  daily. 100 Tube 11  . enalapril (VASOTEC) 5 MG tablet Take 1 tablet (5 mg total) by mouth 2 (two) times daily. 180 tablet 3  . hyoscyamine (LEVBID) 0.375 MG 12 hr tablet TAKE ONE (1) TABLET BY MOUTH EVERY 12 HOURS AS NEEDED 60 tablet 1  . hyoscyamine (LEVSIN SL) 0.125 MG SL tablet USE ONE TO TWO TABLETS SUBLINGUALLY EVERY 4 HOURS AS NEEDED FOR PAIN 30 tablet 3  . LORazepam (ATIVAN) 0.5 MG tablet TAKE ONE (1) TABLET BY MOUTH 3 TIMES DAILY AS NEEDED BETWEEN MEALS AND BEDTIME FOR ANXIETY 30 tablet 5  . meloxicam (MOBIC) 15 MG tablet Take 0.5-1 tablets (7.5-15 mg total) by mouth daily. 90 tablet 1  . methocarbamol (ROBAXIN) 500 MG tablet TAKE ONE (1) TABLET BY MOUTH FOUR (4) TIMES DAILY AS NEEDED 120 tablet 0  . Methylcellulose, Laxative, (CITRUCEL PO) Take by mouth. One once daily     . Multiple Vitamins-Minerals (MULTIVITAMIN,TX-MINERALS) tablet Take 1 tablet by mouth daily.      . Omega 3 1000 MG CAPS Take 2 capsules by mouth daily.    . pantoprazole (PROTONIX) 40 MG tablet TAKE ONE (1) TABLET BY MOUTH TWO (2) TIMES DAILY 180 tablet 3  . pregabalin (LYRICA) 150 MG capsule Take 1 capsule (150 mg total) by mouth 2 (two) times daily. 60 capsule 3  . Sennosides (SENNA LAX PO) Take by mouth. As needed      Facility-Administered Medications Prior to Visit  Medication Dose Route Frequency Provider Last Rate Last Dose  . 0.9 %  sodium chloride infusion  500 mL Intravenous Continuous Irene Shipper, MD        ROS See HPI  Objective:  BP 138/82   Pulse 81   Temp 97.9 F (36.6 C)   Wt 160 lb (72.6 kg)   SpO2 100%   BMI 25.06 kg/m   BP Readings from Last 3 Encounters:  05/28/16 138/82  04/23/16 128/80  03/05/16 132/70    Wt Readings from Last 3 Encounters:  05/28/16 160 lb (72.6 kg)  04/23/16 161 lb (73 kg)  03/05/16 156 lb 4 oz (70.9 kg)    Physical Exam  Constitutional: She is oriented to person, place, and time. No distress.  Neck: Normal range of motion. Neck supple.    Cardiovascular: Normal rate, regular rhythm and normal heart sounds.   Pulmonary/Chest: Effort normal and breath sounds normal.  Abdominal: Soft. Bowel sounds are normal.  Musculoskeletal: Normal range of motion. She exhibits no edema.  Neurological: She is alert and oriented to person, place, and time.  Skin: Skin is warm and dry. No rash noted. No erythema.  Vitals reviewed.   Lab Results  Component Value Date   WBC 5.1 05/08/2016   HGB 14.2 05/08/2016   HCT 41.4 05/08/2016   PLT 264.0 05/08/2016   GLUCOSE 103 (H) 05/08/2016   CHOL 195 05/08/2016   TRIG 80.0 05/08/2016   HDL 58.40 05/08/2016   LDLCALC 121 (H) 05/08/2016   ALT 23 05/08/2016   AST 28 05/08/2016   NA 141 05/08/2016   K 4.8 05/08/2016   CL 105 05/08/2016   CREATININE 0.98 05/08/2016   BUN 21 05/08/2016   CO2 28 05/08/2016   TSH 4.74 (H) 05/08/2016   INR 0.90 08/05/2009   HGBA1C 5.8 05/08/2016    No results found.  Assessment & Plan:   Haley Armstrong was seen today for chest pain.  Diagnoses and all orders for this visit:  Feeling of chest tightness -     EKG 12-Lead -     DG Chest 2 View; Future   I am having Haley Armstrong maintain her aspirin, b complex vitamins, EPIDRIN, (multivitamin,tx-minerals), Cholecalciferol, (Methylcellulose, Laxative, (CITRUCEL PO)), Sennosides (SENNA LAX PO), Diclofenac Sodium, pregabalin, AMBULATORY NON FORMULARY MEDICATION, LORazepam, hyoscyamine, diclofenac sodium, hyoscyamine, Omega 3, methocarbamol, amLODipine, enalapril, meloxicam, and pantoprazole. We will continue to administer sodium chloride.  No orders of the defined types were placed in this encounter.   Follow-up: No Follow-up on file.  Wilfred Lacy, NP

## 2016-05-31 ENCOUNTER — Encounter: Payer: Self-pay | Admitting: Internal Medicine

## 2016-06-04 ENCOUNTER — Encounter: Payer: Self-pay | Admitting: Cardiology

## 2016-06-04 ENCOUNTER — Ambulatory Visit (INDEPENDENT_AMBULATORY_CARE_PROVIDER_SITE_OTHER): Payer: Medicare Other | Admitting: Cardiology

## 2016-06-04 ENCOUNTER — Other Ambulatory Visit: Payer: Self-pay | Admitting: Internal Medicine

## 2016-06-04 VITALS — BP 142/68 | HR 77 | Ht 67.0 in | Wt 159.1 lb

## 2016-06-04 DIAGNOSIS — I10 Essential (primary) hypertension: Secondary | ICD-10-CM

## 2016-06-04 DIAGNOSIS — Z8249 Family history of ischemic heart disease and other diseases of the circulatory system: Secondary | ICD-10-CM | POA: Diagnosis not present

## 2016-06-04 DIAGNOSIS — Z1231 Encounter for screening mammogram for malignant neoplasm of breast: Secondary | ICD-10-CM | POA: Diagnosis not present

## 2016-06-04 DIAGNOSIS — M25552 Pain in left hip: Secondary | ICD-10-CM | POA: Diagnosis not present

## 2016-06-04 DIAGNOSIS — R079 Chest pain, unspecified: Secondary | ICD-10-CM

## 2016-06-04 DIAGNOSIS — M7062 Trochanteric bursitis, left hip: Secondary | ICD-10-CM | POA: Diagnosis not present

## 2016-06-04 HISTORY — DX: Family history of ischemic heart disease and other diseases of the circulatory system: Z82.49

## 2016-06-04 LAB — BASIC METABOLIC PANEL
BUN: 18 mg/dL (ref 7–25)
CO2: 27 mmol/L (ref 20–31)
CREATININE: 0.93 mg/dL (ref 0.50–0.99)
Calcium: 8.7 mg/dL (ref 8.6–10.4)
Chloride: 105 mmol/L (ref 98–110)
Glucose, Bld: 95 mg/dL (ref 65–99)
Potassium: 4.3 mmol/L (ref 3.5–5.3)
Sodium: 140 mmol/L (ref 135–146)

## 2016-06-04 NOTE — Addendum Note (Signed)
Addended by: Eulis Foster on: 06/04/2016 10:15 AM   Modules accepted: Miquel Dunn

## 2016-06-04 NOTE — Patient Instructions (Signed)
Medication Instructions:  Your physician recommends that you continue on your current medications as directed. Please refer to the Current Medication list given to you today.   Labwork: TODAY: BMET  Testing/Procedures: Dr. Radford Pax recommends you have a CORONARY CT.  Follow-Up: Your physician recommends that you schedule a follow-up appointment AS NEEDED with Dr. Radford Pax pending results.  Any Other Special Instructions Will Be Listed Below (If Applicable).     If you need a refill on your cardiac medications before your next appointment, please call your pharmacy.

## 2016-06-04 NOTE — Progress Notes (Signed)
Cardiology Office Note    Date:  06/04/2016   ID:  Haley Armstrong, DOB 1946-05-15, MRN [id number]  PCP:  Walker Kehr, MD  Cardiologist:  Fransico Him, MD   Chief Complaint  Patient presents with  . Chest Pain  . Hypertension    History of Present Illness:  Haley Armstrong is a 70 y.o. female with a history of HTN, GERD with esophageal spasm and strong family history of coronary disease.  She has chronic episodic chest tightness that she has associated with GERD and esophageal spasm.  Nuclear stress test showed a fixed inferoseptal defect with no ischemia - normal LVF with normal wall motion.  Coronary calcium score was 0.   She has been having CP again starting a few weeks ago.  She says it feel as though her chest is " going to explode".  She describes the discomfort at pressure but also will have a burning sensation in her right chest.  The pressure can last anywhere from 15 minutes up to several hours.  It can increase with exertion but occurs at rest.  It will radiate into her neck but not her arms.  She denies any diaphoresis or nausea with the pressure.  She is very anxious and has a lot of problems with muscle spasms and had an injection about 3 weeks ago that seemed to help.  She still continues though with episodic CP.  She denies LE edema, dizziness, palpitations or syncope.  She says that she has been told that she has seemed SOB.  She was seen by her PCP recently for all these symptoms. Labs and EKG were normal.    Past Medical History:  Diagnosis Date  . Anemia, iron deficiency   . Anxiety   . Blood donor   . Family history of coronary artery disease 06/04/2016  . GERD (gastroesophageal reflux disease)   . Hypertension   . IBS (irritable bowel syndrome)   . Osteoarthritis     Past Surgical History:  Procedure Laterality Date  . APPENDECTOMY    . CERVICAL FUSION  2011   [facility]-5 Dr Marcial Pacas  . CHOLECYSTECTOMY    . ESOPHAGEAL MANOMETRY N/A 11/13/2013   Procedure:  ESOPHAGEAL MANOMETRY (EM);  Surgeon: Irene Shipper, MD;  Location: [facility];  Service: Endoscopy;  Laterality: N/A;    Current Medications: Outpatient Medications Prior to Visit  Medication Sig Dispense Refill  . AMBULATORY NON FORMULARY MEDICATION Medication Name: Mylanta and Lidocaine   Take 30-60 cc by mouth twice a day as needed 30 application 0  . amLODipine (NORVASC) 5 MG tablet Take 0.5 tablets (2.5 mg total) by mouth daily. 45 tablet 11  . APAP-Isometheptene-Dichloral (EPIDRIN) 325-65-100 MG CAPS Take by mouth 2 (two) times daily as needed.      Marland Kitchen aspirin 81 MG EC tablet Take 81 mg by mouth 2 (two) times daily.      Marland Kitchen b complex vitamins tablet Take 1 tablet by mouth daily.      . Cholecalciferol 1000 UNITS tablet Take 1,000 Units by mouth daily.      . Diclofenac Sodium (PENNSAID) 1.5 % SOLN APPLY 3 TO 5 DROPS ON SKIN THREE TIMES A DAY FOR PAIN 150 mL 2  . diclofenac sodium (VOLTAREN) 1 % GEL Apply 4 g topically 4 (four) times daily. 100 Tube 11  . enalapril (VASOTEC) 5 MG tablet Take 1 tablet (5 mg total) by mouth 2 (two) times daily. 180 tablet 3  . hyoscyamine (LEVBID) 0.375  MG 12 hr tablet TAKE ONE (1) TABLET BY MOUTH EVERY 12 HOURS AS NEEDED 60 tablet 1  . hyoscyamine (LEVSIN SL) 0.125 MG SL tablet USE ONE TO TWO TABLETS SUBLINGUALLY EVERY 4 HOURS AS NEEDED FOR PAIN 30 tablet 3  . LORazepam (ATIVAN) 0.5 MG tablet TAKE ONE (1) TABLET BY MOUTH 3 TIMES DAILY AS NEEDED BETWEEN MEALS AND BEDTIME FOR ANXIETY 30 tablet 5  . meloxicam (MOBIC) 15 MG tablet Take 0.5-1 tablets (7.5-15 mg total) by mouth daily. 90 tablet 1  . methocarbamol (ROBAXIN) 500 MG tablet TAKE ONE (1) TABLET BY MOUTH FOUR (4) TIMES DAILY AS NEEDED 120 tablet 0  . Methylcellulose, Laxative, (CITRUCEL PO) Take by mouth. One once daily     . Multiple Vitamins-Minerals (MULTIVITAMIN,TX-MINERALS) tablet Take 1 tablet by mouth daily.      . Omega 3 1000 MG CAPS Take 2 capsules by mouth daily.    . pantoprazole (PROTONIX)  40 MG tablet TAKE ONE (1) TABLET BY MOUTH TWO (2) TIMES DAILY 180 tablet 3  . pregabalin (LYRICA) 150 MG capsule Take 1 capsule (150 mg total) by mouth 2 (two) times daily. 60 capsule 3  . Sennosides (SENNA LAX PO) Take by mouth. As needed      Facility-Administered Medications Prior to Visit  Medication Dose Route Frequency Provider Last Rate Last Dose  . 0.9 %  sodium chloride infusion  500 mL Intravenous Continuous Irene Shipper, MD         Allergies:   Patient has no known allergies.   Social History   Social History  . Marital status: Married    Spouse name: N/A  . Number of children: N/A  . Years of education: N/A   Occupational History  . 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 Retired    Social History Main Topics  . Smoking status: Former Research scientist (life sciences)  . Smokeless tobacco: Never Used  . Alcohol use 1.8 oz/week    3 Glasses of wine per week  . Drug use: No  . Sexual activity: Not Asked   Other Topics Concern  . None   Social History Narrative   Taking care of her old mother.           Family History:  The patient's family history includes Dementia in her mother; Heart attack in her brother and maternal aunt; Heart disease in her brother and maternal aunt; Lymphoma in her mother; Parkinsonism in her father; Uterine cancer (age of onset: 59) in her mother.   ROS:   Please see the history of present illness.    ROS All other systems reviewed and are negative.  No flowsheet data found.     PHYSICAL EXAM:   VS:  BP (!) 142/68   Pulse 77   Ht 5\' 7"  (1.702 m)   Wt 159 lb 1.9 oz (72.2 kg)   SpO2 98%   BMI 24.92 kg/m    GEN: Well nourished, well developed, in no acute distress  HEENT: normal  Neck: no JVD, carotid bruits, or masses Cardiac: RRR; no murmurs, rubs, or gallops,no edema.  Intact distal pulses bilaterally.  Respiratory:  clear to auscultation bilaterally, normal work of breathing GI: soft, nontender, nondistended, + BS MS: no  deformity or atrophy  Skin: warm and dry, no rash Neuro:  Alert and Oriented x 3, Strength and sensation are intact Psych: euthymic mood, full affect  Wt Readings from Last 3 Encounters:  06/04/16  159 lb 1.9 oz (72.2 kg)  05/28/16 160 lb (72.6 kg)  04/23/16 161 lb (73 kg)      Studies/Labs Reviewed:   EKG:  EKG is not ordered today.    Recent Labs: 05/08/2016: ALT 23; BUN 21; Creatinine, Ser 0.98; Hemoglobin 14.2; Platelets 264.0; Potassium 4.8; Sodium 141; TSH 4.74   Lipid Panel    Component Value Date/Time   CHOL 195 05/08/2016 0925   TRIG 80.0 05/08/2016 0925   HDL 58.40 05/08/2016 0925   CHOLHDL 3 05/08/2016 0925   VLDL 16.0 05/08/2016 0925   LDLCALC 121 (H) 05/08/2016 0925    Additional studies/ records that were reviewed today include:  none    ASSESSMENT:    1. Chest pain, unspecified type   2. Essential hypertension   3. Family history of coronary artery disease      PLAN:  In order of problems listed above:  1. Chronic chest pain with no ischemic on nuclear stress test a year ago and coronary calcium score of 0.  She continues to have chest pain. EKG at PCP office was normal.  I do not think this is cardiac related but she is very concerned.  I will get a coronary CTA with morphology to assess for obstructive CAD. 2. HTN - Bp controlled on current meds.  Continue amlodipine/ACE I 3. Family history of CAD.    Medication Adjustments/Labs and Tests Ordered: Current medicines are reviewed at length with the patient today.  Concerns regarding medicines are outlined above.  Medication changes, Labs and Tests ordered today are listed in the Patient Instructions below.  There are no Patient Instructions on file for this visit.   Signed, Fransico Him, MD  06/04/2016 10:00 AM    [facility] [address], [place], [place]  [address] Phone: [phone]; Fax: [phone]

## 2016-06-05 ENCOUNTER — Ambulatory Visit: Payer: Medicare Other | Admitting: Physician Assistant

## 2016-06-05 ENCOUNTER — Telehealth: Payer: Self-pay | Admitting: *Deleted

## 2016-06-05 ENCOUNTER — Encounter: Payer: Self-pay | Admitting: Cardiology

## 2016-06-05 DIAGNOSIS — H43811 Vitreous degeneration, right eye: Secondary | ICD-10-CM | POA: Diagnosis not present

## 2016-06-05 DIAGNOSIS — H2513 Age-related nuclear cataract, bilateral: Secondary | ICD-10-CM | POA: Diagnosis not present

## 2016-06-05 NOTE — Telephone Encounter (Signed)
Rec'd fax pt requesting refill on her Lorazepam 0.5mg . Last filled 02/28/15.../LMB

## 2016-06-08 MED ORDER — LORAZEPAM 0.5 MG PO TABS: ORAL_TABLET | ORAL | 3 refills | Status: DC

## 2016-06-08 NOTE — Telephone Encounter (Signed)
Called refill into [facility] had to leave on pharmacy vm...Haley Armstrong

## 2016-06-08 NOTE — Telephone Encounter (Signed)
OK to fill this prescription with additional refills x3 Thank you!  

## 2016-06-25 ENCOUNTER — Encounter (HOSPITAL_COMMUNITY): Payer: Self-pay

## 2016-06-25 ENCOUNTER — Ambulatory Visit (HOSPITAL_COMMUNITY)
Admission: RE | Admit: 2016-06-25 | Discharge: 2016-06-25 | Disposition: A | Discharge status: Home / Self Care | Payer: Medicare Other | Source: Ambulatory Visit | Attending: Cardiology | Admitting: Cardiology

## 2016-06-25 DIAGNOSIS — I700 Atherosclerosis of aorta: Secondary | ICD-10-CM | POA: Insufficient documentation

## 2016-06-25 DIAGNOSIS — R918 Other nonspecific abnormal finding of lung field: Secondary | ICD-10-CM | POA: Insufficient documentation

## 2016-06-25 DIAGNOSIS — Z9049 Acquired absence of other specified parts of digestive tract: Secondary | ICD-10-CM | POA: Diagnosis not present

## 2016-06-25 DIAGNOSIS — Z8249 Family history of ischemic heart disease and other diseases of the circulatory system: Secondary | ICD-10-CM | POA: Diagnosis not present

## 2016-06-25 DIAGNOSIS — R079 Chest pain, unspecified: Secondary | ICD-10-CM | POA: Diagnosis not present

## 2016-06-25 MED ORDER — NITROGLYCERIN 0.4 MG SL SUBL
0.4000 mg | SUBLINGUAL_TABLET | SUBLINGUAL | Status: DC | PRN
  Filled 2016-06-25: qty 25

## 2016-06-25 MED ORDER — NITROGLYCERIN 0.4 MG SL SUBL
0.4000 mg | SUBLINGUAL_TABLET | Freq: Once | SUBLINGUAL | Status: AC
  Administered 2016-06-25: 0.4 mg via SUBLINGUAL

## 2016-06-25 MED ORDER — METOPROLOL TARTRATE 5 MG/5ML IV SOLN
INTRAVENOUS | Status: AC
  Administered 2016-06-25: 2.5 mg via INTRAVENOUS
  Filled 2016-06-25: qty 5

## 2016-06-25 MED ORDER — METOPROLOL TARTRATE 5 MG/5ML IV SOLN
2.5000 mg | INTRAVENOUS | Status: DC | PRN
  Administered 2016-06-25: 2.5 mg via INTRAVENOUS
  Filled 2016-06-25: qty 5

## 2016-06-25 MED ORDER — IOPAMIDOL (ISOVUE-370) INJECTION 76%
INTRAVENOUS | Status: AC
  Administered 2016-06-25: 80 mL
  Filled 2016-06-25: qty 100

## 2016-06-25 MED ORDER — NITROGLYCERIN 0.4 MG SL SUBL
SUBLINGUAL_TABLET | SUBLINGUAL | Status: AC
  Administered 2016-06-25: 0.4 mg via SUBLINGUAL
  Filled 2016-06-25: qty 1

## 2016-06-25 MED ORDER — MIDAZOLAM HCL 2 MG/2ML IJ SOLN
INTRAMUSCULAR | Status: AC
  Filled 2016-06-25: qty 2

## 2016-07-15 ENCOUNTER — Ambulatory Visit: Payer: Medicare Other | Admitting: Neurology

## 2016-07-20 ENCOUNTER — Other Ambulatory Visit (INDEPENDENT_AMBULATORY_CARE_PROVIDER_SITE_OTHER): Payer: Medicare Other

## 2016-07-20 DIAGNOSIS — R946 Abnormal results of thyroid function studies: Secondary | ICD-10-CM | POA: Diagnosis not present

## 2016-07-20 DIAGNOSIS — R7989 Other specified abnormal findings of blood chemistry: Secondary | ICD-10-CM

## 2016-07-20 LAB — T4, FREE: FREE T4: 0.95 ng/dL (ref 0.60–1.60)

## 2016-07-20 LAB — TSH: TSH: 5.1 u[IU]/mL — AB (ref 0.35–4.50)

## 2016-08-05 ENCOUNTER — Encounter: Payer: Self-pay | Admitting: Internal Medicine

## 2016-08-08 ENCOUNTER — Encounter: Payer: Self-pay | Admitting: Internal Medicine

## 2016-08-10 NOTE — Telephone Encounter (Signed)
Patient requesting response.

## 2016-08-21 ENCOUNTER — Other Ambulatory Visit: Payer: Self-pay | Admitting: Internal Medicine

## 2016-08-21 ENCOUNTER — Other Ambulatory Visit: Payer: Self-pay | Admitting: Family

## 2016-08-24 NOTE — Telephone Encounter (Signed)
Robaxin 500 mg refill request LOV: 04/23/2016 Dr, [name] Last refilled 04/16/2016 #120 with 0 refills  No new appt  Please advise for refill

## 2016-09-09 ENCOUNTER — Telehealth: Payer: Self-pay

## 2016-09-09 DIAGNOSIS — R7989 Other specified abnormal findings of blood chemistry: Secondary | ICD-10-CM

## 2016-09-09 NOTE — Telephone Encounter (Signed)
Would she like to see a thyroid specialist? Thx

## 2016-09-09 NOTE — Telephone Encounter (Signed)
Per pt request faxed by pharmacy, " I have been losing my hair at an alarming rate. Please review my thyroid blood work again to see if it would be possible to begin thyroid medication. Besides my weight, which is still an issue, I think I am symptomatic."  Please advise.

## 2016-09-10 NOTE — Telephone Encounter (Signed)
LVM for pt to call back as soon as possible.   

## 2016-09-10 NOTE — Telephone Encounter (Signed)
Spoke with pt and she is interested in seeing a thyroid specialist due to her concerns.

## 2016-09-10 NOTE — Telephone Encounter (Signed)
Pt called back. Please return her call when possible. [place]

## 2016-09-10 NOTE — Telephone Encounter (Signed)
Will refer Thx 

## 2016-09-29 ENCOUNTER — Other Ambulatory Visit: Payer: Self-pay | Admitting: Internal Medicine

## 2016-10-01 DIAGNOSIS — H6121 Impacted cerumen, right ear: Secondary | ICD-10-CM | POA: Diagnosis not present

## 2016-10-01 DIAGNOSIS — J302 Other seasonal allergic rhinitis: Secondary | ICD-10-CM | POA: Diagnosis not present

## 2016-10-01 DIAGNOSIS — J320 Chronic maxillary sinusitis: Secondary | ICD-10-CM | POA: Diagnosis not present

## 2016-10-01 DIAGNOSIS — J322 Chronic ethmoidal sinusitis: Secondary | ICD-10-CM | POA: Diagnosis not present

## 2016-10-01 DIAGNOSIS — J301 Allergic rhinitis due to pollen: Secondary | ICD-10-CM | POA: Diagnosis not present

## 2016-10-08 DIAGNOSIS — J370 Chronic laryngitis: Secondary | ICD-10-CM | POA: Diagnosis not present

## 2016-10-08 DIAGNOSIS — R05 Cough: Secondary | ICD-10-CM | POA: Diagnosis not present

## 2016-10-08 DIAGNOSIS — J301 Allergic rhinitis due to pollen: Secondary | ICD-10-CM | POA: Diagnosis not present

## 2016-10-12 NOTE — Progress Notes (Signed)
Patient ID: Haley Armstrong, female   DOB: 1945-07-25, 71 y.o.   MRN: [id number]             Referring Physician: [name]    Reason for Appointment:  Hypothyroidism, new visit    History of Present Illness:   Hypothyroidism was first diagnosed in 04/2016  At the time of diagnosis patient was   For several months patient is complaining about having hair loss which is somewhat worse then but she has had for the last few years.  She says her hair comes out in  large amounts and she is noticing some thinning of her scalp. She says she has problems with not being able to lose weight although her weight has been about the same for the last couple of years She tends to feel tired which is not new although is able to do her routine during the day and also activities like yardwork She does not complain of cold sensitivity or difficulty with consultation  Her TSH was 4.7 in November and she had this repeated again in January which was slightly higher but her PCP recommended observation only Note that she takes 5000 g of biotin daily         Patient's weight history is as follows:  Wt Readings from Last 3 Encounters:  10/13/16 159 lb (72.1 kg)  06/04/16 159 lb 1.9 oz (72.2 kg)  05/28/16 160 lb (72.6 kg)    Thyroid function results have been as follows:  Lab Results  Component Value Date   TSH 5.10 (H) 07/20/2016   TSH 4.74 (H) 05/08/2016   TSH 2.87 04/25/2014   TSH 4.47 03/13/2013   FREET4 0.95 07/20/2016     Past Medical History:  Diagnosis Date  . Anemia, iron deficiency   . Anxiety   . Blood donor   . Family history of coronary artery disease 06/04/2016  . GERD (gastroesophageal reflux disease)   . Hypertension   . IBS (irritable bowel syndrome)   . Osteoarthritis     Past Surgical History:  Procedure Laterality Date  . APPENDECTOMY    . CERVICAL FUSION  2011   C4-5 Dr Marcial Pacas  . CHOLECYSTECTOMY    . ESOPHAGEAL MANOMETRY N/A 11/13/2013   Procedure: ESOPHAGEAL  MANOMETRY (EM);  Surgeon: Irene Shipper, MD;  Location: [facility];  Service: Endoscopy;  Laterality: N/A;    Family History  Problem Relation Age of Onset  . Lymphoma Mother   . Dementia Mother   . Uterine cancer Mother 25    uterine sarcoma  . Parkinsonism Father   . Heart disease Brother     CAD  . Heart attack Brother   . Heart disease Maternal Aunt   . Heart attack Maternal Aunt   . Colon cancer Neg Hx   . Esophageal cancer Neg Hx   . Rectal cancer Neg Hx   . Stomach cancer Neg Hx     Social History:  reports that she has quit smoking. She has never used smokeless tobacco. She reports that she drinks about 1.8 oz of alcohol per week . She reports that she does not use drugs.  Allergies: No Known Allergies  Allergies as of 10/13/2016   No Known Allergies     Medication List       Accurate as of 10/13/16 11:32 AM. Always use your most recent med list.          AMBULATORY NON FORMULARY MEDICATION Medication Name: Mylanta and Lidocaine  Take 30-60 cc by mouth twice a day as needed   amLODipine 5 MG tablet Commonly known as:  NORVASC Take 0.5 tablets (2.5 mg total) by mouth daily.   aspirin 81 MG EC tablet Take 81 mg by mouth 2 (two) times daily.   b complex vitamins tablet Take 1 tablet by mouth daily.   Cholecalciferol 1000 units tablet Take 1,000 Units by mouth daily.   CITRUCEL PO Take by mouth. One once daily   Diclofenac Sodium 1.5 % Soln Commonly known as:  PENNSAID APPLY 3 TO 5 DROPS ON SKIN THREE TIMES A DAY FOR PAIN   diclofenac sodium 1 % Gel Commonly known as:  VOLTAREN Apply 4 g topically 4 (four) times daily.   enalapril 5 MG tablet Commonly known as:  VASOTEC Take 1 tablet (5 mg total) by mouth 2 (two) times daily.   EPIDRIN 65-100-325 MG capsule Generic drug:  isometheptene-acetaminophen-dichloralphenazone Take by mouth 2 (two) times daily as needed.   hyoscyamine 0.125 MG SL tablet Commonly known as:  LEVSIN SL USE ONE TO TWO  TABLETS SUBLINGUALLY EVERY 4 HOURS AS NEEDED FOR PAIN   hyoscyamine 0.375 MG 12 hr tablet Commonly known as:  LEVBID TAKE ONE (1) TABLET BY MOUTH EVERY 12 HOURS AS NEEDED   LORazepam 0.5 MG tablet Commonly known as:  ATIVAN Take 1 by mouth every night at bedtime as needed for sleep   meloxicam 15 MG tablet Commonly known as:  MOBIC Take 0.5-1 tablets (7.5-15 mg total) by mouth daily.   methocarbamol 500 MG tablet Commonly known as:  ROBAXIN TAKE ONE (1) TABLET BY MOUTH FOUR (4) TIMES DAILY AS NEEDED   multivitamin,tx-minerals tablet Take 1 tablet by mouth daily.   Omega 3 1000 MG Caps Take 2 capsules by mouth daily.   pantoprazole 40 MG tablet Commonly known as:  PROTONIX TAKE ONE (1) TABLET BY MOUTH TWO (2) TIMES DAILY   pregabalin 150 MG capsule Commonly known as:  LYRICA Take 1 capsule (150 mg total) by mouth 2 (two) times daily.   SENNA LAX PO Take by mouth. As needed          Review of Systems  Constitutional: Negative for weight gain.  HENT: Negative for trouble swallowing.   Respiratory: Negative for shortness of breath.   Cardiovascular: Negative for chest pain and palpitations.  Gastrointestinal: Positive for constipation and diarrhea.       She has irritable bowel syndrome, using a certain fiber supplement with some relief  Endocrine: Positive for fatigue. Negative for cold intolerance.       Occasionally will feel hot but not diaphoretic  Musculoskeletal: Negative for back pain.       She has been in her left big toe on activity She thinks her hands get swollen  Skin: Negative for dry skin.  Neurological:       She has some sharp pains along the inside of her left arm with some thinking at times  Psychiatric/Behavioral: Positive for depressed mood and insomnia.       She has mood swings for some time Also has significant late insomnia for quite some time, untreated               Examination:    BP 114/70   Pulse 68   Ht 5\' 7"  (1.702 m)   Wt  159 lb (72.1 kg)   BMI 24.90 kg/m   GENERAL:  Average build.   No pallor, clubbing, lymphadenopathy or edema.  Skin:  no rash or pigmentation.  No hirsutism  She has mild diffuse alopecia mostly in the frontal and temporal areas, some thinning of the eyebrows also  EYES:  No prominence of the eyes or swelling of the eyelids  ENT: Oral mucosa and tongue normal.  THYROID:  Not palpable.  HEART:  Normal  S1 and S2; no murmur or click.  CHEST:    Lungs: Vescicular breath sounds heard equally.  No crepitations/ wheeze.  ABDOMEN:  No distention.  Liver and spleen not palpable.  No other mass or tenderness.  NEUROLOGICAL: Reflexes are bilaterally normal at ankles and slightly brisk at biceps.  Tinel sign negative on the left  JOINTS:  Normal.   Assessment:  HYPOTHYROIDISM, mild and not clear if she is symptomatic from this She has nonspecific symptoms of hair loss, insomnia, fatigue, mood changes and difficulty losing weight  Likely also has depression  Female pattern hair loss which may be unrelated to hypothyroidism although not clear if this could be autoimmune Discussed general concept of autoimmune diseases  PLAN:   She wants to try a levothyroxine supplement to see if her symptoms will improve Considering that her TSH is only about 5 she will be given a prescription for 25 g of levothyroxine to take in the morning Discussed interacting substances Discussed leaving of biotin for evaluation of her free T4 in the next visit  Advised her that she does not see any improvement in her mood swings and sleep disturbance she should discuss this with PCP, may need antidepressant  Most likely will also need to either discuss her alopecia with a dermatologist or try Rogaine for Women  Follow-up in 2 months   [facility] 10/13/2016, 11:32 AM   Consultation note copy sent to the PCP  Note: This office note was prepared with Dragon voice recognition system technology. Any  transcriptional errors that result from this process are unintentional.

## 2016-10-13 ENCOUNTER — Encounter: Payer: Self-pay | Admitting: Endocrinology

## 2016-10-13 ENCOUNTER — Ambulatory Visit (INDEPENDENT_AMBULATORY_CARE_PROVIDER_SITE_OTHER): Payer: Medicare Other | Admitting: Endocrinology

## 2016-10-13 VITALS — BP 114/70 | HR 68 | Ht 67.0 in | Wt 159.0 lb

## 2016-10-13 DIAGNOSIS — F320 Major depressive disorder, single episode, mild: Secondary | ICD-10-CM | POA: Diagnosis not present

## 2016-10-13 DIAGNOSIS — L658 Other specified nonscarring hair loss: Secondary | ICD-10-CM | POA: Diagnosis not present

## 2016-10-13 DIAGNOSIS — F32A Depression, unspecified: Secondary | ICD-10-CM

## 2016-10-13 DIAGNOSIS — E063 Autoimmune thyroiditis: Secondary | ICD-10-CM | POA: Diagnosis not present

## 2016-10-13 MED ORDER — LEVOTHYROXINE SODIUM 25 MCG PO TABS: 25.0000 ug | ORAL_TABLET | Freq: Every day | ORAL | 3 refills | Status: DC

## 2016-10-13 NOTE — Patient Instructions (Signed)
Take calcium/iron pills 2 hrs after thyroid pill in am

## 2016-10-29 ENCOUNTER — Ambulatory Visit: Payer: Medicare Other | Admitting: Podiatry

## 2016-11-05 ENCOUNTER — Ambulatory Visit (INDEPENDENT_AMBULATORY_CARE_PROVIDER_SITE_OTHER): Payer: Medicare Other | Admitting: Podiatry

## 2016-11-05 ENCOUNTER — Encounter: Payer: Self-pay | Admitting: Podiatry

## 2016-11-05 ENCOUNTER — Ambulatory Visit (INDEPENDENT_AMBULATORY_CARE_PROVIDER_SITE_OTHER): Payer: Medicare Other

## 2016-11-05 DIAGNOSIS — M79673 Pain in unspecified foot: Secondary | ICD-10-CM

## 2016-11-05 DIAGNOSIS — M779 Enthesopathy, unspecified: Secondary | ICD-10-CM

## 2016-11-05 DIAGNOSIS — M205X9 Other deformities of toe(s) (acquired), unspecified foot: Secondary | ICD-10-CM | POA: Diagnosis not present

## 2016-11-05 DIAGNOSIS — M21619 Bunion of unspecified foot: Secondary | ICD-10-CM | POA: Diagnosis not present

## 2016-11-05 MED ORDER — TRIAMCINOLONE ACETONIDE 10 MG/ML IJ SUSP
10.0000 mg | Freq: Once | INTRAMUSCULAR | Status: AC
  Administered 2016-11-05: 10 mg

## 2016-11-05 NOTE — Progress Notes (Signed)
Subjective:    Patient ID: Haley Armstrong, female   DOB: 71 y.o.   MRN: [id number]   HPI patient presents stating I been having a lot of pain in my left big toe joint and I'm going to [place] in July and I like to be able to hike. Also my right bunion bothers me    Review of Systems  All other systems reviewed and are negative.       Objective:  Physical Exam Neurovascular status intact with patient found to have significant limitation of motion first MPJ left with crepitus and exquisite discomfort with movement. 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 I do think orthotics to try to reduce the abnormal pathological motion can be of benefit to him and I did go ahead and I injected around the joint of the first MPJ left 3 mg Kenalog 5 mg Xylocaine and discussed that this would require implant procedure. Patient wants to get it done and await to later in the summer    X-ray indicates significant narrowing of the joint surface first MPJ left with mild dorsal spurring and bunion deformity with elevation of the intermetatarsal angle right

## 2016-11-19 ENCOUNTER — Ambulatory Visit (INDEPENDENT_AMBULATORY_CARE_PROVIDER_SITE_OTHER): Payer: Medicare Other | Admitting: Physician Assistant

## 2016-11-19 ENCOUNTER — Encounter: Payer: Self-pay | Admitting: Physician Assistant

## 2016-11-19 VITALS — BP 128/60 | HR 74 | Ht 67.0 in | Wt 158.4 lb

## 2016-11-19 DIAGNOSIS — K224 Dyskinesia of esophagus: Secondary | ICD-10-CM

## 2016-11-19 DIAGNOSIS — K219 Gastro-esophageal reflux disease without esophagitis: Secondary | ICD-10-CM | POA: Diagnosis not present

## 2016-11-19 DIAGNOSIS — K582 Mixed irritable bowel syndrome: Secondary | ICD-10-CM | POA: Diagnosis not present

## 2016-11-19 MED ORDER — HYOSCYAMINE SULFATE ER 0.375 MG PO TB12: 0.3750 mg | ORAL_TABLET | Freq: Two times a day (BID) | ORAL | 3 refills | Status: DC

## 2016-11-19 MED ORDER — PANTOPRAZOLE SODIUM 40 MG PO TBEC: 40.0000 mg | DELAYED_RELEASE_TABLET | Freq: Every day | ORAL | 3 refills | Status: DC

## 2016-11-19 MED ORDER — POLYETHYLENE GLYCOL 3350 17 GM/SCOOP PO POWD: 1.0000 | Freq: Every day | ORAL | 3 refills | Status: DC

## 2016-11-19 MED ORDER — GI COCKTAIL ~~LOC~~: ORAL | 1 refills | Status: DC

## 2016-11-19 NOTE — Patient Instructions (Signed)
If you are age 70 or older, your body mass index should be between 23-30. Your Body mass index is 24.81 kg/m. If this is out of the aforementioned range listed, please consider follow up with your Primary Care Provider.  If you are age 26 or younger, your body mass index should be between 19-25. Your Body mass index is 24.81 kg/m. If this is out of the aformentioned range listed, please consider follow up with your Primary Care Provider.   We have sent the following medications to your pharmacy for you to pick up at your convenience:  Pantoprazole  Levbid  GI Cocktail  Miralax  Please follow up as needed with Dr. Henrene Pastor.  Thank you.

## 2016-11-19 NOTE — Progress Notes (Signed)
Assessment and plans reviewed  

## 2016-11-19 NOTE — Progress Notes (Signed)
Chief Complaint: Medication refill  HPI:   Haley Armstrong is a 71 year old Caucasian female with a past medical history of anemia, anxiety, reflux and IBS, who presents to clinic today for medication refills.    Patient regularly sees Dr. Henrene Pastor and was last seen in clinic on 01/17/14. 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 She was started on sublingual Levsin. It was also recommended she have MiraLax for constipation.    Patient also recently had a screening colonoscopy 02/28/16 which was normal.   Today, the patient tells me that she takes a myriad of medications to help her with her GI symptoms. She uses sublingual Levsin for her nutcracker esophagus but believes that she has enough of this medication. She also uses Levsin 0.375 mg 1-2 times daily for her nutcracker esophagus and GI spasms. Patient tells me that she will occasionally have bowel cramping which typically leads to diarrhea, if she does not use her Levsin on a daily basis she will have more diarrhea and cramping. Sometimes she only uses this medicine as needed. This does help control her symptoms.   Patient also uses Pantoprazole 40 mg once daily for chronic reflux symptoms.   Today the patient also discussed her nutcracker esophagus and tells me that in the past she has tried a GI cocktail which she has now run out of, which had lidocaine in it, this did help during her acute spasms if "I eat the wrong thing", such as "too much caffeine". The patient describes that in combination with the Levsin this helps her to avoid too many spasms.   Patient also describes constipation noting that she will have times where she does not have a bowel movement at all. She has been told to use MiraLAX in the past but tells me that she never tried this on a regular basis.   Patient denies fever, chills, blood in her stool, melena, weight loss, fatigue, anorexia, nausea, vomiting, heartburn or reflux.  Past Medical  History:  Diagnosis Date  . Anemia, iron deficiency   . Anxiety   . Blood donor   . Family history of coronary artery disease 06/04/2016  . GERD (gastroesophageal reflux disease)   . Hypertension   . IBS (irritable bowel syndrome)   . Osteoarthritis     Past Surgical History:  Procedure Laterality Date  . APPENDECTOMY    . CERVICAL FUSION  2011   [facility]5 Dr Marcial Pacas  . CHOLECYSTECTOMY    . ESOPHAGEAL MANOMETRY N/A 11/13/2013   Procedure: ESOPHAGEAL MANOMETRY (EM);  Surgeon: Irene Shipper, MD;  Location: [facility];  Service: Endoscopy;  Laterality: N/A;    Current Outpatient Prescriptions  Medication Sig Dispense Refill  . AMBULATORY NON FORMULARY MEDICATION Medication Name: Mylanta and Lidocaine   Take 30-60 cc by mouth twice a day as needed 30 application 0  . amLODipine (NORVASC) 5 MG tablet Take 0.5 tablets (2.5 mg total) by mouth daily. 45 tablet 11  . APAP-Isometheptene-Dichloral (EPIDRIN) 325-65-100 MG CAPS Take by mouth 2 (two) times daily as needed.      Marland Kitchen aspirin 81 MG EC tablet Take 81 mg by mouth 2 (two) times daily.      Marland Kitchen b complex vitamins tablet Take 1 tablet by mouth daily.      . Cholecalciferol 1000 UNITS tablet Take 1,000 Units by mouth daily.      . Diclofenac Sodium (PENNSAID) 1.5 % SOLN APPLY 3 TO 5 DROPS ON SKIN THREE TIMES A DAY  FOR PAIN 150 mL 2  . diclofenac sodium (VOLTAREN) 1 % GEL Apply 4 g topically 4 (four) times daily. 100 Tube 11  . enalapril (VASOTEC) 5 MG tablet Take 1 tablet (5 mg total) by mouth 2 (two) times daily. 180 tablet 3  . hyoscyamine (LEVBID) 0.375 MG 12 hr tablet TAKE ONE (1) TABLET BY MOUTH EVERY 12 HOURS AS NEEDED 60 tablet 0  . hyoscyamine (LEVSIN SL) 0.125 MG SL tablet USE ONE TO TWO TABLETS SUBLINGUALLY EVERY 4 HOURS AS NEEDED FOR PAIN 30 tablet 3  . levothyroxine (SYNTHROID) 25 MCG tablet Take 1 tablet (25 mcg total) by mouth daily before breakfast. 30 tablet 3  . LORazepam (ATIVAN) 0.5 MG tablet Take 1 by mouth every night at  bedtime as needed for sleep 30 tablet 3  . meloxicam (MOBIC) 15 MG tablet Take 0.5-1 tablets (7.5-15 mg total) by mouth daily. 90 tablet 1  . methocarbamol (ROBAXIN) 500 MG tablet TAKE ONE (1) TABLET BY MOUTH FOUR (4) TIMES DAILY AS NEEDED 120 tablet 1  . Methylcellulose, Laxative, (CITRUCEL PO) Take by mouth. One once daily     . Multiple Vitamins-Minerals (MULTIVITAMIN,TX-MINERALS) tablet Take 1 tablet by mouth daily.      . Omega 3 1000 MG CAPS Take 2 capsules by mouth daily.    . pantoprazole (PROTONIX) 40 MG tablet TAKE ONE (1) TABLET BY MOUTH TWO (2) TIMES DAILY 180 tablet 3  . pregabalin (LYRICA) 150 MG capsule Take 1 capsule (150 mg total) by mouth 2 (two) times daily. 60 capsule 3  . Sennosides (SENNA LAX PO) Take by mouth. As needed     . Alum & Mag Hydroxide-Simeth (GI COCKTAIL) SUSP suspension 31m of 2% viscous lidocaine  979mof Dicyclomine (1046m5ml)  270m19m Maalox (400mg9make 5-10ml 61my 4-6 hours for esophageal spasms 1200 mL 1  . hyoscyamine (LEVBID) 0.375 MG 12 hr tablet Take 1 tablet (0.375 mg total) by mouth 2 (two) times daily. 180 tablet 3  . pantoprazole (PROTONIX) 40 MG tablet Take 1 tablet (40 mg total) by mouth daily. 90 tablet 3  . polyethylene glycol powder (GLYCOLAX/MIRALAX) powder Take 255 g by mouth daily. Mix 17g of Miralax in 8oz of water daily 255 g 3   Current Facility-Administered Medications  Medication Dose Route Frequency Provider Last Rate Last Dose  . 0.9 %  sodium chloride infusion  500 mL Intravenous Continuous Perry,Irene Shipper      Allergies as of 11/19/2016  . (No Known Allergies)    Family History  Problem Relation Age of Onset  . Lymphoma Mother   . Dementia Mother   . Uterine cancer Mother 70    44 uterine sarcoma  . Hypothyroidism Mother   . Parkinsonism Father   . Heart disease Brother        CAD  . Heart attack Brother   . Heart disease Maternal Aunt   . Heart attack Maternal Aunt   . Colon cancer Neg Hx   .  Esophageal cancer Neg Hx   . Rectal cancer Neg Hx   . Stomach cancer Neg Hx     Social History   Social History  . Marital status: Married    Spouse name: N/A  . Number of children: N/A  . Years of education: N/A   Occupational History  . 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 Retired    Social History  Main Topics  . Smoking status: Former Research scientist (life sciences)  . Smokeless tobacco: Never Used  . Alcohol use 1.8 oz/week    3 Glasses of wine per week  . Drug use: No  . Sexual activity: Not on file   Other Topics Concern  . Not on file   Social History Narrative   Taking care of her old mother.          Review of Systems:    Constitutional: No weight loss, fever or chills Cardiovascular: No chest pain Respiratory: No SOB  Gastrointestinal: See HPI and otherwise negative   Physical Exam:  Vital signs: BP 128/60 (BP Location: Left Arm, Patient Position: Sitting, Cuff Size: Normal)   Pulse 74   Ht 5' 7"  (1.702 m)   Wt 158 lb 6.4 oz (71.8 kg)   SpO2 98%   BMI 24.81 kg/m   Constitutional:   Pleasant Caucasian female appears to be in NAD, Well developed, Well nourished, alert and cooperative Respiratory: Respirations even and unlabored. Lungs clear to auscultation bilaterally.   No wheezes, crackles, or rhonchi.  Cardiovascular: Normal S1, S2. No MRG. Regular rate and rhythm. No peripheral edema, cyanosis or pallor.  Gastrointestinal:  Soft, nondistended, nontender. No rebound or guarding. Normal bowel sounds. No appreciable masses or hepatomegaly. Psychiatric:  Demonstrates good judgement and reason without abnormal affect or behaviors.  No recent labs or imaging.  Assessment: 1. Nutcracker esophagus: Controlled currently on sublingual Levsin and daily Levbid 2. IBS: Controlled with Levsin 3. Constipation: Patient does have frequent constipation, recommend MiraLAX 4. GERD: Controlled on Pantoprazole 40 mg daily  Plan: 1. Patient is stable on all of her  maintenance medications. Refilled patient's Pantoprazole 40 mg once daily, 30-60 minutes before eating breakfast #90 refill 3 2. Refilled patient's Levbid 0.375 mg twice a day when necessary #180 refills 3 3. Prescribed GI cocktail 5-10 mL's every 4 6 hours for esophageal spasm 4. Recommend the patient start polyethylene glycol once daily for constipation sent prescription to pharmacy 5. Patient to follow in clinic as needed in the future with Dr. Josefine Class, PA-C [facility] 11/19/2016, 12:49 PM  Cc: Cassandria Anger, MD

## 2016-12-02 ENCOUNTER — Other Ambulatory Visit (INDEPENDENT_AMBULATORY_CARE_PROVIDER_SITE_OTHER): Payer: Medicare Other

## 2016-12-02 DIAGNOSIS — E063 Autoimmune thyroiditis: Secondary | ICD-10-CM | POA: Diagnosis not present

## 2016-12-02 LAB — T4, FREE: FREE T4: 1.16 ng/dL (ref 0.60–1.60)

## 2016-12-02 LAB — TSH: TSH: 2.69 u[IU]/mL (ref 0.35–4.50)

## 2016-12-02 LAB — T3, FREE: T3 FREE: 2.9 pg/mL (ref 2.3–4.2)

## 2016-12-08 ENCOUNTER — Ambulatory Visit: Payer: Medicare Other | Admitting: Endocrinology

## 2016-12-09 ENCOUNTER — Ambulatory Visit: Payer: Medicare Other | Admitting: Endocrinology

## 2016-12-10 ENCOUNTER — Encounter: Payer: Self-pay | Admitting: Endocrinology

## 2016-12-10 ENCOUNTER — Ambulatory Visit (INDEPENDENT_AMBULATORY_CARE_PROVIDER_SITE_OTHER): Payer: Medicare Other | Admitting: Endocrinology

## 2016-12-10 VITALS — BP 126/70 | HR 76 | Ht 67.0 in | Wt 157.6 lb

## 2016-12-10 DIAGNOSIS — E063 Autoimmune thyroiditis: Secondary | ICD-10-CM

## 2016-12-10 NOTE — Progress Notes (Signed)
Patient ID: Haley Armstrong, female   DOB: [birth year], 71 y.o.   MRN: [id number]             Referring Physician: [name]    Reason for Appointment:  Hypothyroidism, follow-up visit    History of Present Illness:   Hypothyroidism was first diagnosed in 04/2016  At the time of initial consultation on her history was as follows: For several months patient is complaining about having hair loss which is somewhat worse then but she has had for the last few years.  She says her hair comes out in  large amounts and she is noticing some thinning of her scalp. She says she has problems with not being able to lose weight although her weight has been about the same for the last couple of years She tends to feel tired which is not new although is able to do her routine during the day and also activities like yardwork She does not complain of cold sensitivity or difficulty with consultation  Labs: Her TSH was 4.7 in November and she had this repeated again in January which was 5.1  Recent history: Since she was concerned about her weight, hair loss and fatigue she was empirically tried on levothyroxine 25 g daily since 09/2016 She does not think this has significantly change her energy level or the hair loss Although she does not think she has changed any other medications or lifestyle she thinks that she is sleeping better at night and may also have somewhat less mood swings Her weight is down 2 pounds  TSH is back to normal         Patient's weight history is as follows:  Wt Readings from Last 3 Encounters:  12/10/16 157 lb 9.6 oz (71.5 kg)  11/19/16 158 lb 6.4 oz (71.8 kg)  10/13/16 159 lb (72.1 kg)    Thyroid function results have been as follows:  Lab Results  Component Value Date   TSH 2.69 12/02/2016   TSH 5.10 (H) 07/20/2016   TSH 4.74 (H) 05/08/2016   TSH 2.87 04/25/2014   FREET4 1.16 12/02/2016   FREET4 0.95 07/20/2016   T3FREE 2.9 12/02/2016     Past Medical  History:  Diagnosis Date  . Anemia, iron deficiency   . Anxiety   . Blood donor   . Family history of coronary artery disease 06/04/2016  . GERD (gastroesophageal reflux disease)   . Hypertension   . IBS (irritable bowel syndrome)   . Osteoarthritis     Past Surgical History:  Procedure Laterality Date  . APPENDECTOMY    . CERVICAL FUSION  2011   C4-5 Dr Marcial Pacas  . CHOLECYSTECTOMY    . ESOPHAGEAL MANOMETRY N/A 11/13/2013   Procedure: ESOPHAGEAL MANOMETRY (EM);  Surgeon: Irene Shipper, MD;  Location: [facility];  Service: Endoscopy;  Laterality: N/A;    Family History  Problem Relation Age of Onset  . Lymphoma Mother   . Dementia Mother   . Uterine cancer Mother 59       uterine sarcoma  . Hypothyroidism Mother   . Parkinsonism Father   . Heart disease Brother        CAD  . Heart attack Brother   . Heart disease Maternal Aunt   . Heart attack Maternal Aunt   . Colon cancer Neg Hx   . Esophageal cancer Neg Hx   . Rectal cancer Neg Hx   . Stomach cancer Neg Hx     Social History:  reports that she has quit smoking. She has never used smokeless tobacco. She reports that she drinks about 1.8 oz of alcohol per week . She reports that she does not use drugs.  Allergies: No Known Allergies  Allergies as of 12/10/2016   No Known Allergies     Medication List       Accurate as of 12/10/16  3:28 PM. Always use your most recent med list.          AMBULATORY NON FORMULARY MEDICATION Medication Name: Mylanta and Lidocaine   Take 30-60 cc by mouth twice a day as needed   amLODipine 5 MG tablet Commonly known as:  NORVASC Take 0.5 tablets (2.5 mg total) by mouth daily.   aspirin 81 MG EC tablet Take 81 mg by mouth 2 (two) times daily.   b complex vitamins tablet Take 1 tablet by mouth daily.   Cholecalciferol 1000 units tablet Take 1,000 Units by mouth daily.   CITRUCEL PO Take by mouth. One once daily   Diclofenac Sodium 1.5 % Soln Commonly known as:   PENNSAID APPLY 3 TO 5 DROPS ON SKIN THREE TIMES A DAY FOR PAIN   diclofenac sodium 1 % Gel Commonly known as:  VOLTAREN Apply 4 g topically 4 (four) times daily.   enalapril 5 MG tablet Commonly known as:  VASOTEC Take 1 tablet (5 mg total) by mouth 2 (two) times daily.   EPIDRIN 65-100-325 MG capsule Generic drug:  isometheptene-acetaminophen-dichloralphenazone Take by mouth 2 (two) times daily as needed.   gi cocktail Susp suspension 79ml of 2% viscous lidocaine  7ml of Dicyclomine (10mg /39ml)  213ml of Maalox (400mg )  Take 5-41ml every 4-6 hours for esophageal spasms   hyoscyamine 0.125 MG SL tablet Commonly known as:  LEVSIN SL USE ONE TO TWO TABLETS SUBLINGUALLY EVERY 4 HOURS AS NEEDED FOR PAIN   hyoscyamine 0.375 MG 12 hr tablet Commonly known as:  LEVBID Take 1 tablet (0.375 mg total) by mouth 2 (two) times daily.   levothyroxine 25 MCG tablet Commonly known as:  SYNTHROID Take 1 tablet (25 mcg total) by mouth daily before breakfast.   LORazepam 0.5 MG tablet Commonly known as:  ATIVAN Take 1 by mouth every night at bedtime as needed for sleep   Lutein-Zeaxanthin 25-5 MG Caps Take by mouth. Takes one daily   meloxicam 15 MG tablet Commonly known as:  MOBIC Take 0.5-1 tablets (7.5-15 mg total) by mouth daily.   methocarbamol 500 MG tablet Commonly known as:  ROBAXIN TAKE ONE (1) TABLET BY MOUTH FOUR (4) TIMES DAILY AS NEEDED   multivitamin,tx-minerals tablet Take 1 tablet by mouth daily.   Omega 3 1000 MG Caps Take 2 capsules by mouth daily.   pantoprazole 40 MG tablet Commonly known as:  PROTONIX Take 1 tablet (40 mg total) by mouth daily.   polyethylene glycol powder powder Commonly known as:  GLYCOLAX/MIRALAX Take 255 g by mouth daily. Mix 17g of Miralax in 8oz of water daily   pregabalin 150 MG capsule Commonly known as:  LYRICA Take 1 capsule (150 mg total) by mouth 2 (two) times daily.   PROBIOTIC ACIDOPHILUS PO Take by mouth. Takes one  daily   SENNA LAX PO Take by mouth. As needed          Review of Systems            She takes Lyrica at night for pain, not in the daytime   Examination:    BP 126/70  Pulse 76   Ht 5\' 7"  (1.702 m)   Wt 157 lb 9.6 oz (71.5 kg)   SpO2 98%   BMI 24.68 kg/m     Assessment:  HYPOTHYROIDISM, mild and not clear if she is having any different benefit from starting levothyroxine supplementation She does appear to have a little less insomnia and better moods and her weight is down 2 pounds She has not felt any worse with taking this and has no symptoms of anxiety with this  She does have some nonspecific fatigue and continues to have some hair loss TSH is back to normal  PLAN:   She may have had some improvement in her depression and sleep with levothyroxine and her weight is slightly better She is agreeable to trying the 25 g levothyroxine supplementation for longer period of time to see if she is consistently feeling better   Follow-up in 4 months   [facility] 12/10/2016, 3:28 PM   Consultation note copy sent to the PCP  Note: This office note was prepared with Dragon voice recognition system technology. Any transcriptional errors that result from this process are unintentional.

## 2016-12-17 ENCOUNTER — Encounter: Payer: Medicare Other | Admitting: Orthotics

## 2016-12-29 ENCOUNTER — Encounter: Payer: Medicare Other | Admitting: Orthotics

## 2017-01-07 ENCOUNTER — Encounter: Payer: Self-pay | Admitting: Podiatry

## 2017-01-07 ENCOUNTER — Ambulatory Visit (INDEPENDENT_AMBULATORY_CARE_PROVIDER_SITE_OTHER): Payer: Medicare Other | Admitting: Podiatry

## 2017-01-07 DIAGNOSIS — M779 Enthesopathy, unspecified: Secondary | ICD-10-CM | POA: Diagnosis not present

## 2017-01-07 DIAGNOSIS — M205X9 Other deformities of toe(s) (acquired), unspecified foot: Secondary | ICD-10-CM

## 2017-01-07 MED ORDER — TRIAMCINOLONE ACETONIDE 10 MG/ML IJ SUSP: 10.0000 mg | Freq: Once | INTRAMUSCULAR | Status: DC

## 2017-01-07 MED ORDER — TRIAMCINOLONE ACETONIDE 10 MG/ML IJ SUSP
10.0000 mg | Freq: Once | INTRAMUSCULAR | Status: AC
  Administered 2017-01-07: 10 mg

## 2017-01-08 NOTE — Progress Notes (Signed)
Subjective:    Patient ID: Haley Armstrong, female   DOB: 71 y.o.   MRN: [id number]   HPI patient states that she did well with the injection but is developing pain again and would like one more injection before her vacation knowing the risk and wants to have surgery hopefully in the fall    ROS      Objective:  Physical Exam neurovascular status intact with patient found to have inflammation pain around the first MPJ left still present with reduced range of motion     Assessment:    Hallux limitus left over right with inflammatory capsulitis     Plan:     I explained risk of injection and she wants this understanding risk and this will be the last one we will do. I did do a careful injection around the first MPJ 3 mg [place] some Kenalog 5 mg Xylocaine and advised on reduced activity and rigid bottom shoes. Will be seen back in the fall and we will have to discuss surgical intervention

## 2017-02-09 ENCOUNTER — Other Ambulatory Visit: Payer: Self-pay | Admitting: Endocrinology

## 2017-02-15 ENCOUNTER — Other Ambulatory Visit (INDEPENDENT_AMBULATORY_CARE_PROVIDER_SITE_OTHER): Payer: Medicare Other

## 2017-02-15 ENCOUNTER — Encounter: Payer: Self-pay | Admitting: Nurse Practitioner

## 2017-02-15 ENCOUNTER — Ambulatory Visit (INDEPENDENT_AMBULATORY_CARE_PROVIDER_SITE_OTHER): Payer: Medicare Other | Admitting: Nurse Practitioner

## 2017-02-15 VITALS — BP 130/80 | HR 75 | Temp 98.2°F | Ht 67.0 in | Wt 157.0 lb

## 2017-02-15 DIAGNOSIS — M545 Low back pain, unspecified: Secondary | ICD-10-CM

## 2017-02-15 DIAGNOSIS — R42 Dizziness and giddiness: Secondary | ICD-10-CM

## 2017-02-15 DIAGNOSIS — K580 Irritable bowel syndrome with diarrhea: Secondary | ICD-10-CM

## 2017-02-15 LAB — POCT URINALYSIS DIPSTICK
Bilirubin, UA: NEGATIVE
Blood, UA: NEGATIVE
Glucose, UA: NEGATIVE
KETONES UA: NEGATIVE
Leukocytes, UA: NEGATIVE
Nitrite, UA: NEGATIVE
PH UA: 6 (ref 5.0–8.0)
PROTEIN UA: NEGATIVE
SPEC GRAV UA: 1.025 (ref 1.010–1.025)
UROBILINOGEN UA: 0.2 U/dL

## 2017-02-15 LAB — CBC WITH DIFFERENTIAL/PLATELET
Basophils Absolute: 0.1 10*3/uL (ref 0.0–0.1)
Basophils Relative: 0.7 % (ref 0.0–3.0)
EOS PCT: 1.1 % (ref 0.0–5.0)
Eosinophils Absolute: 0.1 10*3/uL (ref 0.0–0.7)
HCT: 41.7 % (ref 36.0–46.0)
Hemoglobin: 13.9 g/dL (ref 12.0–15.0)
LYMPHS ABS: 1.4 10*3/uL (ref 0.7–4.0)
Lymphocytes Relative: 17.8 % (ref 12.0–46.0)
MCHC: 33.3 g/dL (ref 30.0–36.0)
MCV: 95.5 fl (ref 78.0–100.0)
MONOS PCT: 9.5 % (ref 3.0–12.0)
Monocytes Absolute: 0.8 10*3/uL (ref 0.1–1.0)
NEUTROS ABS: 5.6 10*3/uL (ref 1.4–7.7)
Neutrophils Relative %: 70.9 % (ref 43.0–77.0)
Platelets: 257 10*3/uL (ref 150.0–400.0)
RBC: 4.37 Mil/uL (ref 3.87–5.11)
RDW: 13.8 % (ref 11.5–15.5)
WBC: 7.9 10*3/uL (ref 4.0–10.5)

## 2017-02-15 LAB — BASIC METABOLIC PANEL
BUN: 14 mg/dL (ref 6–23)
CHLORIDE: 103 meq/L (ref 96–112)
CO2: 28 mEq/L (ref 19–32)
Calcium: 9.3 mg/dL (ref 8.4–10.5)
Creatinine, Ser: 0.86 mg/dL (ref 0.40–1.20)
GFR: 69.29 mL/min (ref >60.00)
GLUCOSE: 103 mg/dL — AB (ref 70–99)
POTASSIUM: 5.2 meq/L — AB (ref 3.5–5.1)
SODIUM: 137 meq/L (ref 135–145)

## 2017-02-15 NOTE — Progress Notes (Signed)
Subjective:  Patient ID: Haley Armstrong, female    DOB: [birth year]  Age: 71 y.o. MRN: [id number]  CC: Back Pain (patient states her lower back has been hurting and states starting friday she was "swimy headed" stated blood pressure over the weekend was 140/64 then saturday it was 98/55/[facility])   HPI  Outpatient Medications Prior to Visit  Medication Sig Dispense Refill  . Alum & Mag Hydroxide-Simeth (GI COCKTAIL) SUSP suspension 50ml of 2% viscous lidocaine  55ml of Dicyclomine (10mg /12ml)  233ml of Maalox (400mg )  Take 5-8ml every 4-6 hours for esophageal spasms 1200 mL 1  . AMBULATORY NON FORMULARY MEDICATION Medication Name: Mylanta and Lidocaine   Take 30-60 cc by mouth twice a day as needed 30 application 0  . amLODipine (NORVASC) 5 MG tablet Take 0.5 tablets (2.5 mg total) by mouth daily. 45 tablet 11  . APAP-Isometheptene-Dichloral (EPIDRIN) 325-65-100 MG CAPS Take by mouth 2 (two) times daily as needed.      Marland Kitchen aspirin 81 MG EC tablet Take 81 mg by mouth 2 (two) times daily.      Marland Kitchen b complex vitamins tablet Take 1 tablet by mouth daily.      . Cholecalciferol 1000 UNITS tablet Take 1,000 Units by mouth daily.      . Diclofenac Sodium (PENNSAID) 1.5 % SOLN APPLY 3 TO 5 DROPS ON SKIN THREE TIMES A DAY FOR PAIN 150 mL 2  . diclofenac sodium (VOLTAREN) 1 % GEL Apply 4 g topically 4 (four) times daily. 100 Tube 11  . enalapril (VASOTEC) 5 MG tablet Take 1 tablet (5 mg total) by mouth 2 (two) times daily. 180 tablet 3  . hyoscyamine (LEVBID) 0.375 MG 12 hr tablet Take 1 tablet (0.375 mg total) by mouth 2 (two) times daily. 180 tablet 3  . hyoscyamine (LEVSIN SL) 0.125 MG SL tablet USE ONE TO TWO TABLETS SUBLINGUALLY EVERY 4 HOURS AS NEEDED FOR PAIN 30 tablet 3  . Lactobacillus (PROBIOTIC ACIDOPHILUS PO) Take by mouth. Takes one daily    . levothyroxine (SYNTHROID, LEVOTHROID) 25 MCG tablet TAKE ONE (1) TABLET BY MOUTH EVERY DAY BEFORE BREAKFAST 30 tablet 2  . LORazepam  (ATIVAN) 0.5 MG tablet Take 1 by mouth every night at bedtime as needed for sleep 30 tablet 3  . Lutein-Zeaxanthin 25-5 MG CAPS Take by mouth. Takes one daily    . meloxicam (MOBIC) 15 MG tablet Take 0.5-1 tablets (7.5-15 mg total) by mouth daily. 90 tablet 1  . methocarbamol (ROBAXIN) 500 MG tablet TAKE ONE (1) TABLET BY MOUTH FOUR (4) TIMES DAILY AS NEEDED 120 tablet 1  . Methylcellulose, Laxative, (CITRUCEL PO) Take by mouth. One once daily     . Multiple Vitamins-Minerals (MULTIVITAMIN,TX-MINERALS) tablet Take 1 tablet by mouth daily.      . Omega 3 1000 MG CAPS Take 2 capsules by mouth daily.    . pantoprazole (PROTONIX) 40 MG tablet Take 1 tablet (40 mg total) by mouth daily. 90 tablet 3  . polyethylene glycol powder (GLYCOLAX/MIRALAX) powder Take 255 g by mouth daily. Mix 17g of Miralax in 8oz of water daily 255 g 3  . pregabalin (LYRICA) 150 MG capsule Take 1 capsule (150 mg total) by mouth 2 (two) times daily. 60 capsule 3  . Sennosides (SENNA LAX PO) Take by mouth. As needed      Facility-Administered Medications Prior to Visit  Medication Dose Route Frequency Provider Last Rate Last Dose  . 0.9 %  sodium chloride infusion  500 mL Intravenous Continuous Irene Shipper, MD      . triamcinolone acetonide (KENALOG) 10 MG/ML injection 10 mg  10 mg Other Once Regal, Norman S, DPM        ROS See HPI  Objective:  BP 130/80 (BP Location: Left Arm, Patient Position: Sitting, Cuff Size: Normal)   Pulse 75   Temp 98.2 F (36.8 C) (Oral)   Ht 5\' 7"  (1.702 m)   Wt 157 lb (71.2 kg)   SpO2 97%   BMI 24.59 kg/m   BP Readings from Last 3 Encounters:  02/15/17 130/80  12/10/16 126/70  11/19/16 128/60    Wt Readings from Last 3 Encounters:  02/15/17 157 lb (71.2 kg)  12/10/16 157 lb 9.6 oz (71.5 kg)  11/19/16 158 lb 6.4 oz (71.8 kg)    Physical Exam  Constitutional: She is oriented to person, place, and time. No distress.  HENT:  Right Ear: External ear normal.  Left Ear:  External ear normal.  Nose: Nose normal.  Mouth/Throat: Uvula is midline and oropharynx is clear and moist. No posterior oropharyngeal erythema.  Neck: Normal range of motion.  Cardiovascular: Normal rate and regular rhythm.   Pulmonary/Chest: Effort normal. No respiratory distress.  Abdominal: She exhibits no distension. There is no tenderness.  Negative flank pain  Musculoskeletal: She exhibits tenderness. She exhibits no edema.  Lymphadenopathy:    She has no cervical adenopathy.  Neurological: She is alert and oriented to person, place, and time. Coordination normal.  Skin: Skin is warm and dry. No rash noted.  Psychiatric: She has a normal mood and affect. Her behavior is normal.  Vitals reviewed.   Lab Results  Component Value Date   WBC 7.9 02/15/2017   HGB 13.9 02/15/2017   HCT 41.7 02/15/2017   PLT 257.0 02/15/2017   GLUCOSE 103 (H) 02/15/2017   CHOL 195 05/08/2016   TRIG 80.0 05/08/2016   HDL 58.40 05/08/2016   LDLCALC 121 (H) 05/08/2016   ALT 23 05/08/2016   AST 28 05/08/2016   NA 137 02/15/2017   K 5.2 (H) 02/15/2017   CL 103 02/15/2017   CREATININE 0.86 02/15/2017   BUN 14 02/15/2017   CO2 28 02/15/2017   TSH 2.69 12/02/2016   INR 0.90 08/05/2009   HGBA1C 5.8 05/08/2016    Ct Coronary Morph W/cta Cor W/score W/ca W/cm &/or Wo/cm  Addendum Date: 06/25/2016   ADDENDUM REPORT: 06/25/2016 17:57 CLINICAL DATA:  Chest pain EXAM: Cardiac CTA MEDICATIONS: Sub lingual nitro. 4mg  and lopressor 2.5mg  IV TECHNIQUE: The patient was scanned on a Philips 188 slice scanner. Gantry rotation speed was 270 msecs. Collimation was .26mm. A 100 kV prospective scan was triggered in the descending thoracic aorta at 111 HU's with 5% padding centered around 78% of the R-R interval. Average HR during the scan was 60 bpm. The 3D data set was interpreted on a dedicated work station using MPR, MIP and VRT modes. A total of 80cc of contrast was used. FINDINGS: Non-cardiac: See separate  report from [facility]. Calcium Score:  0 Agatston units. Coronary Arteries: Right dominant with no anomalies LM:  No plaque or stenosis. LAD system: Distal LAD was relatively small but no plaque or stenosis noted. There was a moderate to large first diagonal. Circumflex system: No plaque or stenosis. Small ramus and large PLOM noted. RCA:  No plaque or stenosis. IMPRESSION: 1. Coronary artery calcium score of 0 Agatston units, suggesting low risk for future cardiac events. 2.  No  plaque or stenosis noted in the coronary arteries. Dalton Mclean Electronically Signed   By: Loralie Champagne M.D.   On: 06/25/2016 17:57   Result Date: 06/25/2016 EXAM: OVER-READ INTERPRETATION  CT CHEST The following report is an over-read performed by radiologist Dr. [facility] on 06/25/2016. This over-read does not include interpretation of cardiac or coronary anatomy or pathology. The coronary CTA interpretation by the cardiologist is attached. COMPARISON:  Chest CT 06/27/2015 FINDINGS: Cardiovascular: Heart is normal size. Visualized aorta is normal caliber with scattered calcifications in the aortic arch and descending thoracic aorta. Mediastinum/Nodes: No adenopathy in the visualized axillae, mediastinum or hila. Lungs/Pleura: Linear densities in the right middle lobe are stable since prior study compatible with scarring. No pleural effusions. Visualized left lung clear. Upper Abdomen: Imaging into the upper abdomen shows no acute findings. Prior cholecystectomy. Musculoskeletal: Chest wall soft tissues are unremarkable. No acute bony abnormality or focal bone lesion. Degenerative spurring in the thoracic spine. IMPRESSION: No acute or significant extracardiac abnormality. Electronically Signed: By: Rolm Baptise M.D. On: 06/25/2016 10:39    Assessment & Plan:   Cylah was seen today for back pain.  Diagnoses and all orders for this visit:  Acute bilateral low back pain without  sciatica -     CBC w/Diff; Future -     Basic metabolic panel; Future -     POCT urinalysis dipstick -     Cancel: Urine Culture -     Urine Culture; Future  Dizziness -     CBC w/Diff; Future -     Basic metabolic panel; Future -     POCT urinalysis dipstick -     Cancel: Urine Culture -     Urine Culture; Future  Irritable bowel syndrome with diarrhea -     CBC w/Diff; Future -     Basic metabolic panel; Future -     POCT urinalysis dipstick   I am having Ms. Piedra maintain her aspirin, b complex vitamins, EPIDRIN, (multivitamin,tx-minerals), Cholecalciferol, (Methylcellulose, Laxative, (CITRUCEL PO)), Sennosides (SENNA LAX PO), Diclofenac Sodium, pregabalin, AMBULATORY NON FORMULARY MEDICATION, hyoscyamine, diclofenac sodium, Omega 3, amLODipine, enalapril, meloxicam, LORazepam, methocarbamol, pantoprazole, polyethylene glycol powder, hyoscyamine, gi cocktail, Lactobacillus (PROBIOTIC ACIDOPHILUS PO), Lutein-Zeaxanthin, and levothyroxine. We will continue to administer sodium chloride and triamcinolone acetonide.  No orders of the defined types were placed in this encounter.   Follow-up: Return if symptoms worsen or fail to improve.  Wilfred Lacy, NP

## 2017-02-15 NOTE — Patient Instructions (Addendum)
Go to basement for blood draw and urine collection Continue to push oral hydration. Stable lab results. Use tylenol or ibuprofen for back pain. Use dramamine or meclizine for vertigo Dizziness Dizziness is a common problem. It makes you feel unsteady or light-headed. You may feel like you are about to pass out (faint). Dizziness can lead to getting hurt if you stumble or fall. Dizziness can be caused by many things, including:  Medicines.  Not having enough water in your body (dehydration).  Illness.  Follow these instructions at home: Eating and drinking  Drink enough fluid to keep your pee (urine) clear or pale yellow. This helps to keep you from getting dehydrated. Try to drink more clear fluids, such as water.  Do not drink alcohol.  Limit how much caffeine you drink or eat, if your doctor tells you to do that.  Limit how much salt (sodium) you drink or eat, if your doctor tells you to do that. Activity  Avoid making quick movements. ? When you stand up from sitting in a chair, steady yourself until you feel okay. ? In the morning, first sit up on the side of the bed. When you feel okay, stand slowly while you hold onto something. Do this until you know that your balance is fine.  If you need to stand in one place for a long time, move your legs often. Tighten and relax the muscles in your legs while you are standing.  Do not drive or use heavy machinery if you feel dizzy.  Avoid bending down if you feel dizzy. Place items in your home so you can reach them easily without leaning over. Lifestyle  Do not use any products that contain nicotine or tobacco, such as cigarettes and e-cigarettes. If you need help quitting, ask your doctor.  Try to lower your stress level. You can do this by using methods such as yoga or meditation. Talk with your doctor if you need help. General instructions  Watch your dizziness for any changes.  Take over-the-counter and prescription  medicines only as told by your doctor. Talk with your doctor if you think that you are dizzy because of a medicine that you are taking.  Tell a friend or a family member that you are feeling dizzy. If he or she notices any changes in your behavior, have this person call your doctor.  Keep all follow-up visits as told by your doctor. This is important. Contact a doctor if:  Your dizziness does not go away.  Your dizziness or light-headedness gets worse.  You feel sick to your stomach (nauseous).  You have trouble hearing.  You have new symptoms.  You are unsteady on your feet.  You feel like the room is spinning. Get help right away if:  You throw up (vomit) or have watery poop (diarrhea), and you cannot eat or drink anything.  You have trouble: ? Talking. ? Walking. ? Swallowing. ? Using your arms, hands, or legs.  You feel generally weak.  You are not thinking clearly, or you have trouble forming sentences. A friend or family member may notice this.  You have: ? Chest pain. ? Pain in your belly (abdomen). ? Shortness of breath. ? Sweating.  Your vision changes.  You are bleeding.  You have a very bad headache.  You have neck pain or a stiff neck.  You have a fever. These symptoms may be an emergency. Do not wait to see if the symptoms will go away. Get medical  help right away. Call your local emergency services (911 in the [place] Do not drive yourself to the hospital. Summary  Dizziness makes you feel unsteady or light-headed. You may feel like you are about to pass out (faint).  Drink enough fluid to keep your pee (urine) clear or pale yellow. Do not drink alcohol.  Avoid making quick movements if you feel dizzy.  Watch your dizziness for any changes. This information is not intended to replace advice given to you by your health care provider. Make sure you discuss any questions you have with your health care provider. Document Released: 06/04/2011  Document Revised: 07/02/2016 Document Reviewed: 07/02/2016 Elsevier Interactive Patient Education  2017 [employer].

## 2017-02-16 LAB — URINE CULTURE: Organism ID, Bacteria: NO GROWTH

## 2017-02-17 ENCOUNTER — Other Ambulatory Visit: Payer: Self-pay | Admitting: Internal Medicine

## 2017-02-25 ENCOUNTER — Telehealth: Payer: Self-pay

## 2017-02-25 NOTE — Telephone Encounter (Addendum)
Key: [id number]

## 2017-03-08 ENCOUNTER — Other Ambulatory Visit: Payer: Self-pay | Admitting: Internal Medicine

## 2017-03-25 NOTE — Telephone Encounter (Signed)
PA denied please advise an alternative medication

## 2017-03-29 DIAGNOSIS — M898X1 Other specified disorders of bone, shoulder: Secondary | ICD-10-CM | POA: Diagnosis not present

## 2017-03-29 DIAGNOSIS — M25512 Pain in left shoulder: Secondary | ICD-10-CM | POA: Diagnosis not present

## 2017-03-29 MED ORDER — GLYCOPYRROLATE 1 MG PO TABS: 1.0000 mg | ORAL_TABLET | Freq: Three times a day (TID) | ORAL | 3 refills | Status: DC | PRN

## 2017-03-29 NOTE — Telephone Encounter (Signed)
OK Robinul prn Thx

## 2017-04-01 DIAGNOSIS — H2513 Age-related nuclear cataract, bilateral: Secondary | ICD-10-CM | POA: Diagnosis not present

## 2017-04-01 DIAGNOSIS — S0502XD Injury of conjunctiva and corneal abrasion without foreign body, left eye, subsequent encounter: Secondary | ICD-10-CM | POA: Diagnosis not present

## 2017-04-12 ENCOUNTER — Other Ambulatory Visit (INDEPENDENT_AMBULATORY_CARE_PROVIDER_SITE_OTHER): Payer: Medicare Other

## 2017-04-12 DIAGNOSIS — E063 Autoimmune thyroiditis: Secondary | ICD-10-CM

## 2017-04-12 LAB — TSH: TSH: 3.34 u[IU]/mL (ref 0.35–4.50)

## 2017-04-12 LAB — T4, FREE: Free T4: 1.13 ng/dL (ref 0.60–1.60)

## 2017-04-15 ENCOUNTER — Encounter: Payer: Self-pay | Admitting: Endocrinology

## 2017-04-15 ENCOUNTER — Ambulatory Visit (INDEPENDENT_AMBULATORY_CARE_PROVIDER_SITE_OTHER): Payer: Medicare Other | Admitting: Endocrinology

## 2017-04-15 VITALS — BP 138/80 | HR 77 | Ht 67.0 in | Wt 161.2 lb

## 2017-04-15 DIAGNOSIS — E063 Autoimmune thyroiditis: Secondary | ICD-10-CM | POA: Diagnosis not present

## 2017-04-15 NOTE — Progress Notes (Signed)
Patient ID: Haley Armstrong, female   DOB: 1945/08/07, 71 y.o.   MRN: [id number]             Referring Physician: [name]    Reason for Appointment:  Hypothyroidism, follow-up visit    History of Present Illness:   Hypothyroidism was first diagnosed in 04/2016  At the time of initial consultation on her history was as follows: For several months patient is complaining about having hair loss which is somewhat worse then but she has had for the last few years.  She says her hair comes out in  large amounts and she is noticing some thinning of her scalp. She says she has problems with not being able to lose weight although her weight has been about the same for the last couple of years She tends to feel tired which is not new although is able to do her routine during the day and also activities like yardwork She does not complain of cold sensitivity or difficulty with consultation  Labs: Her TSH was 4.7 in November and she had this repeated again in January which was 5.1  Recent history: Since she was concerned about her weight, hair loss and fatigue she was empirically tried on levothyroxine 25 g daily since 09/2016 Her follow-up she was feeling somewhat better and felt that her sleep and mood swings are better Still having some fatigue and now her hair loss is slightly better She does not think this has significantly change her energy level or the hair loss  She still continues to take the levothyroxine 25 g before breakfast regularly TSH is now consistently normal         Patient's weight history is as follows:  Wt Readings from Last 3 Encounters:  04/15/17 161 lb 3.2 oz (73.1 kg)  02/15/17 157 lb (71.2 kg)  12/10/16 157 lb 9.6 oz (71.5 kg)    Thyroid function results have been as follows:  Lab Results  Component Value Date   TSH 3.34 04/12/2017   TSH 2.69 12/02/2016   TSH 5.10 (H) 07/20/2016   TSH 4.74 (H) 05/08/2016   FREET4 1.13 04/12/2017   FREET4 1.16  12/02/2016   FREET4 0.95 07/20/2016   T3FREE 2.9 12/02/2016     Past Medical History:  Diagnosis Date  . Anemia, iron deficiency   . Anxiety   . Blood donor   . Family history of coronary artery disease 06/04/2016  . GERD (gastroesophageal reflux disease)   . Hypertension   . IBS (irritable bowel syndrome)   . Osteoarthritis     Past Surgical History:  Procedure Laterality Date  . APPENDECTOMY    . CERVICAL FUSION  2011   [facility]5 Dr Marcial Pacas  . CHOLECYSTECTOMY    . ESOPHAGEAL MANOMETRY N/A 11/13/2013   Procedure: ESOPHAGEAL MANOMETRY (EM);  Surgeon: Irene Shipper, MD;  Location: [facility];  Service: Endoscopy;  Laterality: N/A;    Family History  Problem Relation Age of Onset  . Lymphoma Mother   . Dementia Mother   . Uterine cancer Mother 14       uterine sarcoma  . Hypothyroidism Mother   . Parkinsonism Father   . Heart disease Brother        CAD  . Heart attack Brother   . Heart disease Maternal Aunt   . Heart attack Maternal Aunt   . Colon cancer Neg Hx   . Esophageal cancer Neg Hx   . Rectal cancer Neg Hx   . Stomach  cancer Neg Hx     Social History:  reports that she has quit smoking. She has never used smokeless tobacco. She reports that she drinks about 1.8 oz of alcohol per week . She reports that she does not use drugs.  Allergies: No Known Allergies  Allergies as of 04/15/2017   No Known Allergies     Medication List       Accurate as of 04/15/17  2:17 PM. Always use your most recent med list.          AMBULATORY NON FORMULARY MEDICATION Medication Name: Mylanta and Lidocaine   Take 30-60 cc by mouth twice a day as needed   amLODipine 5 MG tablet Commonly known as:  NORVASC Take 0.5 tablets (2.5 mg total) by mouth daily.   aspirin 81 MG EC tablet Take 81 mg by mouth 2 (two) times daily.   b complex vitamins tablet Take 1 tablet by mouth daily.   Cholecalciferol 1000 units tablet Take 1,000 Units by mouth daily.   CITRUCEL PO Take by  mouth. One once daily   Diclofenac Sodium 1.5 % Soln Commonly known as:  PENNSAID APPLY 3 TO 5 DROPS ON SKIN THREE TIMES A DAY FOR PAIN   diclofenac sodium 1 % Gel Commonly known as:  VOLTAREN Apply 4 g topically 4 (four) times daily.   enalapril 5 MG tablet Commonly known as:  VASOTEC Take 1 tablet (5 mg total) by mouth 2 (two) times daily.   EPIDRIN 65-100-325 MG capsule Generic drug:  isometheptene-acetaminophen-dichloralphenazone Take by mouth 2 (two) times daily as needed.   gi cocktail Susp suspension 84ml of 2% viscous lidocaine  52ml of Dicyclomine (10mg /52ml)  222ml of Maalox (400mg )  Take 5-76ml every 4-6 hours for esophageal spasms   glycopyrrolate 1 MG tablet Commonly known as:  ROBINUL Take 1 tablet (1 mg total) by mouth 3 (three) times daily as needed.   hyoscyamine 0.375 MG 12 hr tablet Commonly known as:  LEVBID Take 1 tablet (0.375 mg total) by mouth 2 (two) times daily.   hyoscyamine 0.125 MG SL tablet Commonly known as:  LEVSIN SL DISSOLVE 1-2 TABLETS UNDER THE TONGUE EVERY FOUR HOURS AS NEEDED FOR PAIN   levothyroxine 25 MCG tablet Commonly known as:  SYNTHROID, LEVOTHROID TAKE ONE (1) TABLET BY MOUTH EVERY DAY BEFORE BREAKFAST   LORazepam 0.5 MG tablet Commonly known as:  ATIVAN Take 1 by mouth every night at bedtime as needed for sleep   Lutein-Zeaxanthin 25-5 MG Caps Take by mouth. Takes one daily   meloxicam 15 MG tablet Commonly known as:  MOBIC TAKE 1/2-1 TABLET BY MOUTH ONCE DAILY. *Patient needs office visit before refills will be given*   methocarbamol 500 MG tablet Commonly known as:  ROBAXIN TAKE ONE (1) TABLET BY MOUTH FOUR (4) TIMES DAILY AS NEEDED   multivitamin,tx-minerals tablet Take 1 tablet by mouth daily.   Omega 3 1000 MG Caps Take 2 capsules by mouth daily.   pantoprazole 40 MG tablet Commonly known as:  PROTONIX Take 1 tablet (40 mg total) by mouth daily.   polyethylene glycol powder powder Commonly known as:   GLYCOLAX/MIRALAX Take 255 g by mouth daily. Mix 17g of Miralax in 8oz of water daily   pregabalin 150 MG capsule Commonly known as:  LYRICA Take 1 capsule (150 mg total) by mouth 2 (two) times daily.   PROBIOTIC ACIDOPHILUS PO Take by mouth. Takes one daily   SENNA LAX PO Take by mouth. As needed  Review of Systems            She takes Lyrica at night for pain, not in the daytime   Examination:    BP 138/80   Pulse 77   Ht 5\' 7"  (1.702 m)   Wt 161 lb 3.2 oz (73.1 kg)   SpO2 98%   BMI 25.25 kg/m   Thyroid not palpable Reflexes appear normal  Assessment:  HYPOTHYROIDISM, mild With the trial of levothyroxine supplementation she does feel slightly better and now is having a little less hair loss She thinks she did benefit from starting levothyroxine as a trial She does have some nonspecific fatigue which is not consistent TSH is quite normal now although just over 3    PLAN:   She will continue taking the 25 g levothyroxine  Follow-up in 6 months with repeat thyroid levels   Vaniah Chambers 04/15/2017, 2:17 PM     Note: This office note was prepared with Dragon voice recognition system technology. Any transcriptional errors that result from this process are unintentional.

## 2017-04-15 NOTE — Patient Instructions (Addendum)
Check Vitamin D.  

## 2017-04-26 ENCOUNTER — Ambulatory Visit (INDEPENDENT_AMBULATORY_CARE_PROVIDER_SITE_OTHER): Payer: Medicare Other | Admitting: Internal Medicine

## 2017-04-26 ENCOUNTER — Encounter: Payer: Self-pay | Admitting: Internal Medicine

## 2017-04-26 VITALS — BP 136/72 | HR 77 | Temp 97.8°F | Ht 67.0 in | Wt 162.0 lb

## 2017-04-26 DIAGNOSIS — Z23 Encounter for immunization: Secondary | ICD-10-CM | POA: Diagnosis not present

## 2017-04-26 DIAGNOSIS — F411 Generalized anxiety disorder: Secondary | ICD-10-CM

## 2017-04-26 DIAGNOSIS — Z0000 Encounter for general adult medical examination without abnormal findings: Secondary | ICD-10-CM

## 2017-04-26 DIAGNOSIS — D509 Iron deficiency anemia, unspecified: Secondary | ICD-10-CM

## 2017-04-26 DIAGNOSIS — M542 Cervicalgia: Secondary | ICD-10-CM

## 2017-04-26 DIAGNOSIS — I10 Essential (primary) hypertension: Secondary | ICD-10-CM

## 2017-04-26 MED ORDER — METHOCARBAMOL 500 MG PO TABS: ORAL_TABLET | ORAL | 1 refills | Status: DC

## 2017-04-26 MED ORDER — AMLODIPINE BESYLATE 2.5 MG PO TABS: 2.5000 mg | ORAL_TABLET | Freq: Every day | ORAL | 3 refills | Status: DC

## 2017-04-26 MED ORDER — MELOXICAM 15 MG PO TABS: ORAL_TABLET | ORAL | 0 refills | Status: DC

## 2017-04-26 MED ORDER — HYOSCYAMINE SULFATE 0.125 MG SL SUBL: SUBLINGUAL_TABLET | SUBLINGUAL | 2 refills | Status: DC

## 2017-04-26 MED ORDER — PANTOPRAZOLE SODIUM 40 MG PO TBEC: 40.0000 mg | DELAYED_RELEASE_TABLET | Freq: Every day | ORAL | 3 refills | Status: DC

## 2017-04-26 MED ORDER — ENALAPRIL MALEATE 5 MG PO TABS: 5.0000 mg | ORAL_TABLET | Freq: Two times a day (BID) | ORAL | 3 refills | Status: DC

## 2017-04-26 MED ORDER — ZOSTER VAC RECOMB ADJUVANTED 50 MCG/0.5ML IM SUSR: 0.5000 mL | Freq: Once | INTRAMUSCULAR | 1 refills | Status: AC

## 2017-04-26 MED ORDER — PREGABALIN 150 MG PO CAPS: 150.0000 mg | ORAL_CAPSULE | Freq: Two times a day (BID) | ORAL | 1 refills | Status: DC

## 2017-04-26 NOTE — Assessment & Plan Note (Addendum)
Chronic Rare use of Lorazepam  Potential benefits of a long term benzodiazepines  use as well as potential risks  and complications were explained to the patient and were aknowledged.

## 2017-04-26 NOTE — Assessment & Plan Note (Signed)
CBC

## 2017-04-26 NOTE — Progress Notes (Signed)
Subjective:  Patient ID: Haley Armstrong, female    DOB: 01-16-1946  Age: 71 y.o. MRN: [id number]  CC: No chief complaint on file.   HPI Haley Armstrong presents for a well exam  Outpatient Medications Prior to Visit  Medication Sig Dispense Refill  . Alum & Mag Hydroxide-Simeth (GI COCKTAIL) SUSP suspension 81ml of 2% viscous lidocaine  65ml of Dicyclomine (10mg /50ml)  230ml of Maalox (400mg )  Take 5-48ml every 4-6 hours for esophageal spasms 1200 mL 1  . AMBULATORY NON FORMULARY MEDICATION Medication Name: Mylanta and Lidocaine   Take 30-60 cc by mouth twice a day as needed 30 application 0  . amLODipine (NORVASC) 5 MG tablet Take 0.5 tablets (2.5 mg total) by mouth daily. 45 tablet 11  . APAP-Isometheptene-Dichloral (EPIDRIN) 325-65-100 MG CAPS Take by mouth 2 (two) times daily as needed.      Marland Kitchen aspirin 81 MG EC tablet Take 81 mg by mouth 2 (two) times daily.      Marland Kitchen b complex vitamins tablet Take 1 tablet by mouth daily.      . Cholecalciferol 1000 UNITS tablet Take 1,000 Units by mouth daily.      . Diclofenac Sodium (PENNSAID) 1.5 % SOLN APPLY 3 TO 5 DROPS ON SKIN THREE TIMES A DAY FOR PAIN 150 mL 2  . diclofenac sodium (VOLTAREN) 1 % GEL Apply 4 g topically 4 (four) times daily. 100 Tube 11  . enalapril (VASOTEC) 5 MG tablet Take 1 tablet (5 mg total) by mouth 2 (two) times daily. 180 tablet 3  . glycopyrrolate (ROBINUL) 1 MG tablet Take 1 tablet (1 mg total) by mouth 3 (three) times daily as needed. 90 tablet 3  . hyoscyamine (LEVBID) 0.375 MG 12 hr tablet Take 1 tablet (0.375 mg total) by mouth 2 (two) times daily. 180 tablet 3  . hyoscyamine (LEVSIN SL) 0.125 MG SL tablet DISSOLVE 1-2 TABLETS UNDER THE TONGUE EVERY FOUR HOURS AS NEEDED FOR PAIN 100 tablet 2  . Lactobacillus (PROBIOTIC ACIDOPHILUS PO) Take by mouth. Takes one daily    . levothyroxine (SYNTHROID, LEVOTHROID) 25 MCG tablet TAKE ONE (1) TABLET BY MOUTH EVERY DAY BEFORE BREAKFAST 30 tablet 2  . LORazepam  (ATIVAN) 0.5 MG tablet Take 1 by mouth every night at bedtime as needed for sleep 30 tablet 3  . Lutein-Zeaxanthin 25-5 MG CAPS Take by mouth. Takes one daily    . meloxicam (MOBIC) 15 MG tablet TAKE 1/2-1 TABLET BY MOUTH ONCE DAILY. *Patient needs office visit before refills will be given* 90 tablet 0  . methocarbamol (ROBAXIN) 500 MG tablet TAKE ONE (1) TABLET BY MOUTH FOUR (4) TIMES DAILY AS NEEDED 120 tablet 1  . Methylcellulose, Laxative, (CITRUCEL PO) Take by mouth. One once daily     . Multiple Vitamins-Minerals (MULTIVITAMIN,TX-MINERALS) tablet Take 1 tablet by mouth daily.      . Omega 3 1000 MG CAPS Take 2 capsules by mouth daily.    . pantoprazole (PROTONIX) 40 MG tablet Take 1 tablet (40 mg total) by mouth daily. 90 tablet 3  . polyethylene glycol powder (GLYCOLAX/MIRALAX) powder Take 255 g by mouth daily. Mix 17g of Miralax in 8oz of water daily 255 g 3  . pregabalin (LYRICA) 150 MG capsule Take 1 capsule (150 mg total) by mouth 2 (two) times daily. 60 capsule 3  . Sennosides (SENNA LAX PO) Take by mouth. As needed      Facility-Administered Medications Prior to Visit  Medication Dose Route Frequency Provider  Last Rate Last Dose  . 0.9 %  sodium chloride infusion  500 mL Intravenous Continuous Irene Shipper, MD      . triamcinolone acetonide (KENALOG) 10 MG/ML injection 10 mg  10 mg Other Once Regal, Norman S, DPM        ROS Review of Systems  Constitutional: Negative for activity change, appetite change, chills, fatigue and unexpected weight change.  HENT: Negative for congestion, mouth sores and sinus pressure.   Eyes: Negative for visual disturbance.  Respiratory: Negative for cough and chest tightness.   Gastrointestinal: Negative for abdominal pain and nausea.  Genitourinary: Negative for difficulty urinating, frequency and vaginal pain.  Musculoskeletal: Positive for back pain. Negative for gait problem.  Skin: Negative for pallor and rash.  Neurological: Negative for  dizziness, tremors, weakness, numbness and headaches.  Psychiatric/Behavioral: Negative for confusion and sleep disturbance.    Objective:  BP 136/72 (BP Location: Left Arm, Patient Position: Sitting, Cuff Size: Normal)   Pulse 77   Temp 97.8 F (36.6 C) (Oral)   Ht 5\' 7"  (1.702 m)   Wt 162 lb (73.5 kg)   SpO2 98%   BMI 25.37 kg/m   BP Readings from Last 3 Encounters:  04/26/17 136/72  04/15/17 138/80  02/15/17 130/80    Wt Readings from Last 3 Encounters:  04/26/17 162 lb (73.5 kg)  04/15/17 161 lb 3.2 oz (73.1 kg)  02/15/17 157 lb (71.2 kg)    Physical Exam  Constitutional: She appears well-developed. No distress.  HENT:  Head: Normocephalic.  Right Ear: External ear normal.  Left Ear: External ear normal.  Nose: Nose normal.  Mouth/Throat: Oropharynx is clear and moist.  Eyes: Pupils are equal, round, and reactive to light. Conjunctivae are normal. Right eye exhibits no discharge. Left eye exhibits no discharge.  Neck: Normal range of motion. Neck supple. No JVD present. No tracheal deviation present. No thyromegaly present.  Cardiovascular: Normal rate, regular rhythm and normal heart sounds.   Pulmonary/Chest: No stridor. No respiratory distress. She has no wheezes.  Abdominal: Soft. Bowel sounds are normal. She exhibits no distension and no mass. There is no tenderness. There is no rebound and no guarding.  Musculoskeletal: She exhibits no edema or tenderness.  Lymphadenopathy:    She has no cervical adenopathy.  Neurological: She displays normal reflexes. No cranial nerve deficit. She exhibits normal muscle tone. Coordination normal.  Skin: No rash noted. No erythema.  Psychiatric: She has a normal mood and affect. Her behavior is normal. Judgment and thought content normal.  neck stiff LS tender w/ROM  Lab Results  Component Value Date   WBC 7.9 02/15/2017   HGB 13.9 02/15/2017   HCT 41.7 02/15/2017   PLT 257.0 02/15/2017   GLUCOSE 103 (H) 02/15/2017    CHOL 195 05/08/2016   TRIG 80.0 05/08/2016   HDL 58.40 05/08/2016   LDLCALC 121 (H) 05/08/2016   ALT 23 05/08/2016   AST 28 05/08/2016   NA 137 02/15/2017   K 5.2 (H) 02/15/2017   CL 103 02/15/2017   CREATININE 0.86 02/15/2017   BUN 14 02/15/2017   CO2 28 02/15/2017   TSH 3.34 04/12/2017   INR 0.90 08/05/2009   HGBA1C 5.8 05/08/2016    Ct Coronary Morph W/cta Cor W/score W/ca W/cm &/or Wo/cm  Addendum Date: 06/25/2016   ADDENDUM REPORT: 06/25/2016 17:57 CLINICAL DATA:  Chest pain EXAM: Cardiac CTA MEDICATIONS: Sub lingual nitro. 4mg  and lopressor 2.5mg  IV TECHNIQUE: The patient was scanned on a Philips  852 slice scanner. Gantry rotation speed was 270 msecs. Collimation was .35mm. A 100 kV prospective scan was triggered in the descending thoracic aorta at 111 HU's with 5% padding centered around 78% of the R-R interval. Average HR during the scan was 60 bpm. The 3D data set was interpreted on a dedicated work station using MPR, MIP and VRT modes. A total of 80cc of contrast was used. FINDINGS: Non-cardiac: See separate report from [facility]. Calcium Score:  0 Agatston units. Coronary Arteries: Right dominant with no anomalies LM:  No plaque or stenosis. LAD system: Distal LAD was relatively small but no plaque or stenosis noted. There was a moderate to large first diagonal. Circumflex system: No plaque or stenosis. Small ramus and large PLOM noted. RCA:  No plaque or stenosis. IMPRESSION: 1. Coronary artery calcium score of 0 Agatston units, suggesting low risk for future cardiac events. 2.  No plaque or stenosis noted in the coronary arteries. Dalton Mclean Electronically Signed   By: Loralie Champagne M.D.   On: 06/25/2016 17:57   Result Date: 06/25/2016 EXAM: OVER-READ INTERPRETATION  CT CHEST The following report is an over-read performed by radiologist Dr. Collene Leyden [facility] on 06/25/2016. This over-read does not include interpretation of cardiac or coronary  anatomy or pathology. The coronary CTA interpretation by the cardiologist is attached. COMPARISON:  Chest CT 06/27/2015 FINDINGS: Cardiovascular: Heart is normal size. Visualized aorta is normal caliber with scattered calcifications in the aortic arch and descending thoracic aorta. Mediastinum/Nodes: No adenopathy in the visualized axillae, mediastinum or hila. Lungs/Pleura: Linear densities in the right middle lobe are stable since prior study compatible with scarring. No pleural effusions. Visualized left lung clear. Upper Abdomen: Imaging into the upper abdomen shows no acute findings. Prior cholecystectomy. Musculoskeletal: Chest wall soft tissues are unremarkable. No acute bony abnormality or focal bone lesion. Degenerative spurring in the thoracic spine. IMPRESSION: No acute or significant extracardiac abnormality. Electronically Signed: By: Rolm Baptise M.D. On: 06/25/2016 10:39    Assessment & Plan:   There are no diagnoses linked to this encounter. I am having Ms. Smarr maintain her aspirin, b complex vitamins, EPIDRIN, (multivitamin,tx-minerals), Cholecalciferol, (Methylcellulose, Laxative, (CITRUCEL PO)), Sennosides (SENNA LAX PO), Diclofenac Sodium, pregabalin, AMBULATORY NON FORMULARY MEDICATION, diclofenac sodium, Omega 3, amLODipine, enalapril, LORazepam, methocarbamol, pantoprazole, polyethylene glycol powder, hyoscyamine, gi cocktail, Lactobacillus (PROBIOTIC ACIDOPHILUS PO), Lutein-Zeaxanthin, levothyroxine, hyoscyamine, meloxicam, and glycopyrrolate. We will continue to administer sodium chloride and triamcinolone acetonide.  No orders of the defined types were placed in this encounter.    Follow-up: No Follow-up on file.  Walker Kehr, MD

## 2017-04-26 NOTE — Assessment & Plan Note (Signed)
F/u w/Dr Vertell Limber Lyrica prn

## 2017-04-26 NOTE — Addendum Note (Signed)
Addended by: Karren Cobble on: 04/26/2017 11:32 AM   Modules accepted: Orders

## 2017-04-26 NOTE — Assessment & Plan Note (Signed)
Amlodipine, Enalapril 

## 2017-04-26 NOTE — Assessment & Plan Note (Signed)
Here for medicare wellness/physical  Diet: heart healthy  Physical activity: not sedentary  Depression/mood screen: negative  Hearing: intact to whispered voice  Visual acuity: grossly normal, performs annual eye exam  ADLs: capable  Fall risk: low to none  Home safety: good  Cognitive evaluation: intact to orientation, naming, recall and repetition  EOL planning: adv directives, full code/ I agree  I have personally reviewed and have noted  1. The patient's medical, surgical and social history  2. Their use of alcohol, tobacco or illicit drugs  3. Their current medications and supplements  4. The patient's functional ability including ADL's, fall risks, home safety risks and hearing or visual impairment.  5. Diet and physical activities  6. Evidence for depression or mood disorders 7. The roster of all physicians providing medical care to patient - is listed in the Snapshot section of the chart and reviewed today.    Today patient counseled on age appropriate routine health concerns for screening and prevention, each reviewed and up to date or declined. Immunizations reviewed and up to date or declined. Labs ordered and reviewed. Risk factors for depression reviewed and negative. Hearing function and visual acuity are intact. ADLs screened and addressed as needed. Functional ability and level of safety reviewed and appropriate. Education, counseling and referrals performed based on assessed risks today. Patient provided with a copy of personalized plan for preventive services.  [facility]

## 2017-05-05 DIAGNOSIS — M898X1 Other specified disorders of bone, shoulder: Secondary | ICD-10-CM | POA: Diagnosis not present

## 2017-05-05 DIAGNOSIS — M25512 Pain in left shoulder: Secondary | ICD-10-CM | POA: Diagnosis not present

## 2017-05-24 ENCOUNTER — Other Ambulatory Visit: Payer: Self-pay

## 2017-05-24 ENCOUNTER — Telehealth: Payer: Self-pay | Admitting: Endocrinology

## 2017-05-24 MED ORDER — LEVOTHYROXINE SODIUM 25 MCG PO TABS: ORAL_TABLET | ORAL | 2 refills | Status: DC

## 2017-05-24 NOTE — Telephone Encounter (Signed)
Patient needs prescription for Levothyroxine sent to [facility] at [facility]. Ph# [phone]

## 2017-05-24 NOTE — Telephone Encounter (Signed)
Called patient and left a voice message to let her know that I have sent this [facility] to the [facility] on [address].

## 2017-07-09 DIAGNOSIS — Z1231 Encounter for screening mammogram for malignant neoplasm of breast: Secondary | ICD-10-CM | POA: Diagnosis not present

## 2017-07-09 DIAGNOSIS — Z780 Asymptomatic menopausal state: Secondary | ICD-10-CM | POA: Diagnosis not present

## 2017-07-21 ENCOUNTER — Other Ambulatory Visit: Payer: Self-pay | Admitting: Internal Medicine

## 2017-07-22 ENCOUNTER — Other Ambulatory Visit (INDEPENDENT_AMBULATORY_CARE_PROVIDER_SITE_OTHER): Payer: Medicare Other

## 2017-07-22 ENCOUNTER — Other Ambulatory Visit: Payer: Self-pay

## 2017-07-22 DIAGNOSIS — Z0000 Encounter for general adult medical examination without abnormal findings: Secondary | ICD-10-CM | POA: Diagnosis not present

## 2017-07-22 DIAGNOSIS — E063 Autoimmune thyroiditis: Secondary | ICD-10-CM | POA: Diagnosis not present

## 2017-07-22 LAB — T4, FREE: Free T4: 1.04 ng/dL (ref 0.60–1.60)

## 2017-07-22 LAB — BASIC METABOLIC PANEL
BUN: 20 mg/dL (ref 6–23)
CALCIUM: 9.2 mg/dL (ref 8.4–10.5)
CO2: 27 mEq/L (ref 19–32)
Chloride: 106 mEq/L (ref 96–112)
Creatinine, Ser: 0.89 mg/dL (ref 0.40–1.20)
GFR: 66.52 mL/min (ref >60.00)
GLUCOSE: 112 mg/dL — AB (ref 70–99)
Potassium: 4.6 mEq/L (ref 3.5–5.1)
SODIUM: 141 meq/L (ref 135–145)

## 2017-07-22 LAB — URINALYSIS, ROUTINE W REFLEX MICROSCOPIC
BILIRUBIN URINE: NEGATIVE
HGB URINE DIPSTICK: NEGATIVE
KETONES UR: NEGATIVE
NITRITE: NEGATIVE
RBC / HPF: NONE SEEN (ref 0–?)
SPECIFIC GRAVITY, URINE: 1.02 (ref 1.000–1.030)
Total Protein, Urine: NEGATIVE
URINE GLUCOSE: NEGATIVE
UROBILINOGEN UA: 0.2 (ref 0.0–1.0)
pH: 6 (ref 5.0–8.0)

## 2017-07-22 LAB — HEPATIC FUNCTION PANEL
ALT: 25 U/L (ref 0–35)
AST: 28 U/L (ref 0–37)
Albumin: 4.1 g/dL (ref 3.5–5.2)
Alkaline Phosphatase: 57 U/L (ref 39–117)
Bilirubin, Direct: 0.1 mg/dL (ref 0.0–0.3)
TOTAL PROTEIN: 6.8 g/dL (ref 6.0–8.3)
Total Bilirubin: 0.6 mg/dL (ref 0.2–1.2)

## 2017-07-22 LAB — LIPID PANEL
Cholesterol: 183 mg/dL (ref 0–200)
HDL: 56.4 mg/dL (ref >39.00)
LDL CALC: 107 mg/dL — AB (ref 0–99)
NonHDL: 126.13
TRIGLYCERIDES: 95 mg/dL (ref 0.0–149.0)
Total CHOL/HDL Ratio: 3
VLDL: 19 mg/dL (ref 0.0–40.0)

## 2017-07-22 LAB — CBC WITH DIFFERENTIAL/PLATELET
BASOS ABS: 0.1 10*3/uL (ref 0.0–0.1)
Basophils Relative: 1.3 % (ref 0.0–3.0)
EOS PCT: 3.8 % (ref 0.0–5.0)
Eosinophils Absolute: 0.2 10*3/uL (ref 0.0–0.7)
HCT: 40.5 % (ref 36.0–46.0)
HEMOGLOBIN: 13.8 g/dL (ref 12.0–15.0)
Lymphocytes Relative: 39.1 % (ref 12.0–46.0)
Lymphs Abs: 1.9 10*3/uL (ref 0.7–4.0)
MCHC: 34 g/dL (ref 30.0–36.0)
MCV: 94.4 fl (ref 78.0–100.0)
Monocytes Absolute: 0.6 10*3/uL (ref 0.1–1.0)
Monocytes Relative: 13.4 % — ABNORMAL HIGH (ref 3.0–12.0)
NEUTROS PCT: 42.4 % — AB (ref 43.0–77.0)
Neutro Abs: 2 10*3/uL (ref 1.4–7.7)
Platelets: 253 10*3/uL (ref 150.0–400.0)
RBC: 4.29 Mil/uL (ref 3.87–5.11)
RDW: 13.5 % (ref 11.5–15.5)
WBC: 4.8 10*3/uL (ref 4.0–10.5)

## 2017-07-22 LAB — TSH
TSH: 2.89 u[IU]/mL (ref 0.35–4.50)
TSH: 2.99 u[IU]/mL (ref 0.35–4.50)

## 2017-07-27 DIAGNOSIS — Z85828 Personal history of other malignant neoplasm of skin: Secondary | ICD-10-CM | POA: Diagnosis not present

## 2017-07-27 DIAGNOSIS — L570 Actinic keratosis: Secondary | ICD-10-CM | POA: Diagnosis not present

## 2017-07-27 DIAGNOSIS — L658 Other specified nonscarring hair loss: Secondary | ICD-10-CM | POA: Diagnosis not present

## 2017-07-27 DIAGNOSIS — L648 Other androgenic alopecia: Secondary | ICD-10-CM | POA: Diagnosis not present

## 2017-07-27 DIAGNOSIS — Z08 Encounter for follow-up examination after completed treatment for malignant neoplasm: Secondary | ICD-10-CM | POA: Diagnosis not present

## 2017-07-28 ENCOUNTER — Other Ambulatory Visit: Payer: Self-pay | Admitting: Internal Medicine

## 2017-08-19 ENCOUNTER — Other Ambulatory Visit: Payer: Self-pay | Admitting: Endocrinology

## 2017-08-19 ENCOUNTER — Other Ambulatory Visit: Payer: Self-pay

## 2017-08-19 MED ORDER — LEVOTHYROXINE SODIUM 25 MCG PO TABS: ORAL_TABLET | ORAL | 2 refills | Status: DC

## 2017-10-11 ENCOUNTER — Other Ambulatory Visit: Payer: Medicare Other

## 2017-10-12 ENCOUNTER — Other Ambulatory Visit: Payer: Self-pay | Admitting: Internal Medicine

## 2017-10-14 ENCOUNTER — Ambulatory Visit: Payer: Medicare Other | Admitting: Endocrinology

## 2017-11-17 ENCOUNTER — Ambulatory Visit: Payer: Medicare Other | Admitting: Family

## 2017-11-17 ENCOUNTER — Encounter: Payer: Self-pay | Admitting: Family

## 2017-11-17 ENCOUNTER — Other Ambulatory Visit: Payer: Self-pay | Admitting: Endocrinology

## 2017-11-17 VITALS — BP 110/62 | HR 57 | Temp 98.4°F | Ht 67.0 in | Wt 158.1 lb

## 2017-11-17 DIAGNOSIS — J209 Acute bronchitis, unspecified: Secondary | ICD-10-CM

## 2017-11-17 DIAGNOSIS — E063 Autoimmune thyroiditis: Secondary | ICD-10-CM

## 2017-11-17 MED ORDER — HYDROCODONE-HOMATROPINE 5-1.5 MG/5ML PO SYRP: 5.0000 mL | ORAL_SOLUTION | Freq: Three times a day (TID) | ORAL | 0 refills | Status: DC | PRN

## 2017-11-17 MED ORDER — CEFDINIR 300 MG PO CAPS: 300.0000 mg | ORAL_CAPSULE | Freq: Two times a day (BID) | ORAL | 0 refills | Status: DC

## 2017-11-17 MED ORDER — BENZONATATE 100 MG PO CAPS: 100.0000 mg | ORAL_CAPSULE | Freq: Three times a day (TID) | ORAL | 0 refills | Status: DC | PRN

## 2017-11-17 NOTE — Progress Notes (Signed)
Haley Armstrong is a 72 y.o. female with the following history as recorded in EpicCare:  Patient Active Problem List   Diagnosis Date Noted  . Family history of coronary artery disease 06/04/2016  . Cellulitis of foot 02/07/2016  . Chest pain 05/16/2015  . Well adult exam 03/09/2013  . Edema 03/09/2013  . Pain in left foot 03/08/2013  . ADD (attention deficit disorder) 06/15/2012  . Compulsive behavior disorder ([place]) 06/15/2012  . Acute left lumbar radiculopathy 10/18/2011  . Iron deficiency anemia 10/21/2010  . History of esophageal spasm 10/21/2010  . FATIGUE, ACUTE 07/02/2009  . TOBACCO USE, QUIT 07/02/2009  . NONSPEC ABN FINDNG RAD & OTH EXAM ABDOMINAL AREA 04/11/2009  . Diarrhea 04/09/2009  . ABDOMINAL PAIN-EPIGASTRIC 04/09/2009  . Anxiety state 02/24/2009  . Rash and other nonspecific skin eruption 09/30/2007  . Memory loss 09/21/2007  . Goiter, unspecified 09/09/2007  . Essential hypertension 05/24/2007  . PARESTHESIA 05/24/2007  . Cervicalgia 05/20/2007  . Other abnormal glucose 05/20/2007  . GERD 04/06/2007  . Irritable bowel syndrome 04/06/2007  . OSTEOARTHRITIS 04/06/2007  . LOW BACK PAIN 04/06/2007  . TROCHANTERIC BURSITIS 04/06/2007    Current Outpatient Medications  Medication Sig Dispense Refill  . Alum & Mag Hydroxide-Simeth (GI COCKTAIL) SUSP suspension 32ml of 2% viscous lidocaine  32ml of Dicyclomine (10mg /57ml)  272ml of Maalox (400mg )  Take 5-65ml every 4-6 hours for esophageal spasms 1200 mL 1  . AMBULATORY NON FORMULARY MEDICATION Medication Name: Mylanta and Lidocaine   Take 30-60 cc by mouth twice a day as needed 30 application 0  . amLODipine (NORVASC) 2.5 MG tablet Take 1 tablet (2.5 mg total) by mouth daily. 90 tablet 3  . APAP-Isometheptene-Dichloral (EPIDRIN) 325-65-100 MG CAPS Take by mouth 2 (two) times daily as needed.      Marland Kitchen aspirin 81 MG EC tablet Take 81 mg by mouth 2 (two) times daily.      Marland Kitchen b complex vitamins tablet Take 1 tablet  by mouth daily.      . Cholecalciferol 1000 UNITS tablet Take 1,000 Units by mouth daily.      . Diclofenac Sodium (PENNSAID) 1.5 % SOLN APPLY 3 TO 5 DROPS ON SKIN THREE TIMES A DAY FOR PAIN 150 mL 2  . diclofenac sodium (VOLTAREN) 1 % GEL Apply 4 g topically 4 (four) times daily. 100 Tube 11  . enalapril (VASOTEC) 5 MG tablet Take 1 tablet (5 mg total) by mouth 2 (two) times daily. 180 tablet 3  . enalapril (VASOTEC) 5 MG tablet TAKE ONE (1) TABLET BY MOUTH TWO (2) TIMES DAILY 180 tablet 1  . glycopyrrolate (ROBINUL) 1 MG tablet Take 1 tablet (1 mg total) by mouth 3 (three) times daily as needed. 90 tablet 3  . hyoscyamine (LEVSIN SL) 0.125 MG SL tablet DISSOLVE 1-2 TABLETS UNDER THE TONGUE EVERY FOUR HOURS AS NEEDED FOR PAIN 100 tablet 2  . Lactobacillus (PROBIOTIC ACIDOPHILUS PO) Take by mouth. Takes one daily    . levothyroxine (SYNTHROID, LEVOTHROID) 25 MCG tablet TAKE ONE TABLET BY MOUTH EVERY DAY BEFORE BREAKFAST 90 tablet 2  . LORazepam (ATIVAN) 0.5 MG tablet Take 1 by mouth every night at bedtime as needed for sleep 30 tablet 3  . Lutein-Zeaxanthin 25-5 MG CAPS Take by mouth. Takes one daily    . meloxicam (MOBIC) 15 MG tablet TAKE 1/2-1 TABLET BY MOUTH ONCE DAILY. 90 tablet 0  . methocarbamol (ROBAXIN) 500 MG tablet TAKE ONE (1) TABLET BY MOUTH FOUR (4) TIMES  DAILY AS NEEDED 120 tablet 1  . Methylcellulose, Laxative, (CITRUCEL PO) Take by mouth. One once daily     . Multiple Vitamins-Minerals (MULTIVITAMIN,TX-MINERALS) tablet Take 1 tablet by mouth daily.      . Omega 3 1000 MG CAPS Take 2 capsules by mouth daily.    . pantoprazole (PROTONIX) 40 MG tablet Take 1 tablet (40 mg total) by mouth daily. 90 tablet 3  . polyethylene glycol powder (GLYCOLAX/MIRALAX) powder Take 255 g by mouth daily. Mix 17g of Miralax in 8oz of water daily 255 g 3  . pregabalin (LYRICA) 150 MG capsule Take 1 capsule (150 mg total) by mouth 2 (two) times daily. 180 capsule 1  . Sennosides (SENNA LAX PO) Take by  mouth. As needed     . benzonatate (TESSALON) 100 MG capsule Take 1 capsule (100 mg total) by mouth 3 (three) times daily as needed. 20 capsule 0  . cefdinir (OMNICEF) 300 MG capsule Take 1 capsule (300 mg total) by mouth 2 (two) times daily. 20 capsule 0  . HYDROcodone-homatropine (HYCODAN) 5-1.5 MG/5ML syrup Take 5 mLs by mouth every 8 (eight) hours as needed for cough. 75 mL 0   Current Facility-Administered Medications  Medication Dose Route Frequency Provider Last Rate Last Dose  . 0.9 %  sodium chloride infusion  500 mL Intravenous Continuous Irene Shipper, MD      . triamcinolone acetonide (KENALOG) 10 MG/ML injection 10 mg  10 mg Other Once Wallene Huh, DPM        Allergies: Patient has no known allergies.  Past Medical History:  Diagnosis Date  . Anemia, iron deficiency   . Anxiety   . Blood donor   . Family history of coronary artery disease 06/04/2016  . GERD (gastroesophageal reflux disease)   . Hypertension   . IBS (irritable bowel syndrome)   . Osteoarthritis     Past Surgical History:  Procedure Laterality Date  . APPENDECTOMY    . CERVICAL FUSION  2011   C4[facility]5 Dr Marcial Pacas  . CHOLECYSTECTOMY    . ESOPHAGEAL MANOMETRY N/A 11/13/2013   Procedure: ESOPHAGEAL MANOMETRY (EM);  Surgeon: Irene Shipper, MD;  Location: [facility];  Service: Endoscopy;  Laterality: N/A;    Family History  Problem Relation Age of Onset  . Lymphoma Mother   . Dementia Mother   . Uterine cancer Mother 40       uterine sarcoma  . Hypothyroidism Mother   . Parkinsonism Father   . Heart disease Brother        CAD  . Heart attack Brother   . Heart disease Maternal Aunt   . Heart attack Maternal Aunt   . Colon cancer Neg Hx   . Esophageal cancer Neg Hx   . Rectal cancer Neg Hx   . Stomach cancer Neg Hx     Social History   Tobacco Use  . Smoking status: Former Research scientist (life sciences)  . Smokeless tobacco: Never Used  Substance Use Topics  . Alcohol use: Yes    Alcohol/week: 1.8 oz    Types: 3  Glasses of wine per week    Subjective:  Patient presents with concerns for cough/ congestion x 1 week; difficulty sleeping at night due to the cough- used Tessalon Perles last night with some benefit; + chest feels tight due to the coughing; no fever; history of bronchitis- no history of pneumonia;  Objective:  Vitals:   11/17/17 0814  BP: 110/62  Pulse: (!) 57  Temp:  98.4 F (36.9 C)  TempSrc: Oral  SpO2: 92%  Weight: 158 lb 1.3 oz (71.7 kg)  Height: 5\' 7"  (1.702 m)    General: Well developed, well nourished, in no acute distress  Skin : Warm and dry.  Head: Normocephalic and atraumatic  Eyes: Sclera and conjunctiva clear; pupils round and reactive to light; extraocular movements intact  Ears: External normal; canals clear; tympanic membranes normal  Oropharynx: Pink, supple. No suspicious lesions  Neck: Supple without thyromegaly, adenopathy  Lungs: Respirations unlabored; clear to auscultation bilaterally without wheeze, rales, rhonchi  [facility]S exam: normal rate and regular rhythm.  Neurologic: Alert and oriented; speech intact; face symmetrical; moves all extremities well; CNII-XII intact without focal deficit   Assessment:  1. Acute bronchitis, unspecified organism     Plan:  Rx for Omnicef 300 mg bid x 10 days, Tessalon Perles and Hycodan to help with cough at night; increase fluids, rest and follow-up worse, no better.   No follow-ups on file.  No orders of the defined types were placed in this encounter.   Requested Prescriptions   Signed Prescriptions Disp Refills  . cefdinir (OMNICEF) 300 MG capsule 20 capsule 0    Sig: Take 1 capsule (300 mg total) by mouth 2 (two) times daily.  . benzonatate (TESSALON) 100 MG capsule 20 capsule 0    Sig: Take 1 capsule (100 mg total) by mouth 3 (three) times daily as needed.  Marland Kitchen HYDROcodone-homatropine (HYCODAN) 5-1.5 MG/5ML syrup 75 mL 0    Sig: Take 5 mLs by mouth every 8 (eight) hours as needed for cough.

## 2017-11-18 ENCOUNTER — Telehealth: Payer: Self-pay | Admitting: Internal Medicine

## 2017-11-18 NOTE — Telephone Encounter (Signed)
Please advise and I will call her to pick up note.

## 2017-11-18 NOTE — Telephone Encounter (Signed)
Copied from [place] [id number]. Topic: Quick Communication - See Telephone Encounter >> Nov 18, 2017  1:05 PM Percell Belt A wrote: CRM for notification. See Telephone encounter for: 11/18/17.  Pt called in and stated that she was told at her visit yesterday if she needed a note for today, laura would give her one for missing work today   Best number [phone]

## 2017-11-19 ENCOUNTER — Telehealth: Payer: Self-pay | Admitting: Family

## 2017-11-19 ENCOUNTER — Other Ambulatory Visit (INDEPENDENT_AMBULATORY_CARE_PROVIDER_SITE_OTHER): Payer: Medicare Other

## 2017-11-19 DIAGNOSIS — E063 Autoimmune thyroiditis: Secondary | ICD-10-CM

## 2017-11-19 LAB — TSH: TSH: 2.5 u[IU]/mL (ref 0.35–4.50)

## 2017-11-19 LAB — T4, FREE: Free T4: 1 ng/dL (ref 0.60–1.60)

## 2017-11-19 NOTE — Telephone Encounter (Signed)
Called and left message for patient this morning that her note was ready for pick up.

## 2017-11-19 NOTE — Telephone Encounter (Signed)
I accidentally closed last note- will put the work note on your desk for her.

## 2017-11-24 ENCOUNTER — Ambulatory Visit: Payer: Medicare Other | Admitting: Endocrinology

## 2017-11-25 NOTE — Progress Notes (Signed)
Patient ID: Haley Armstrong, female   DOB: 1946/06/23, 72 y.o.   MRN: [id number]             Referring Physician: [name]    Reason for Appointment:  Hypothyroidism, follow-up visit    History of Present Illness:   Hypothyroidism was first diagnosed in 04/2016  At the time of initial consultation on her history was as follows: For several months patient is complaining about having hair loss which is somewhat worse then but she has had for the last few years.  She says her hair comes out in  large amounts and she is noticing some thinning of her scalp. She says she has problems with not being able to lose weight although her weight has been about the same for the last couple of years She tends to feel tired which is not new although is able to do her routine during the day and also activities like yardwork She does not complain of cold sensitivity or difficulty with consultation  Labs: Her TSH was 4.7 in November and she had this repeated again in January which was 5.1  Recent history: Since she was concerned about her weight, hair loss and fatigue she was empirically tried on levothyroxine 25 g daily in 09/2016 On her follow-up she was feeling somewhat better and felt that her sleep and mood swings were better Also felt that her hair loss was improving  Her dose has been continued at 25 mcg levothyroxine after her follow-up in October 2018 She takes her supplement consistently in the morning before breakfast  Recently not complaining of any unusual fatigue, is still sleeping fairly well and has been able to keep her weight down  TSH is now further improved and normal         Patient's weight history is as follows:  Wt Readings from Last 3 Encounters:  11/26/17 159 lb (72.1 kg)  11/17/17 158 lb 1.3 oz (71.7 kg)  04/26/17 162 lb (73.5 kg)    Thyroid function results have been as follows:  Lab Results  Component Value Date   TSH 2.50 11/19/2017   TSH 2.89 07/22/2017   TSH 2.99 07/22/2017   TSH 3.34 04/12/2017   FREET4 1.00 11/19/2017   FREET4 1.04 07/22/2017   FREET4 1.13 04/12/2017   FREET4 1.16 12/02/2016   T3FREE 2.9 12/02/2016     Past Medical History:  Diagnosis Date  . Anemia, iron deficiency   . Anxiety   . Blood donor   . Family history of coronary artery disease 06/04/2016  . GERD (gastroesophageal reflux disease)   . Hypertension   . IBS (irritable bowel syndrome)   . Osteoarthritis     Past Surgical History:  Procedure Laterality Date  . APPENDECTOMY    . CERVICAL FUSION  2011   C4-5 Dr Marcial Pacas  . CHOLECYSTECTOMY    . ESOPHAGEAL MANOMETRY N/A 11/13/2013   Procedure: ESOPHAGEAL MANOMETRY (EM);  Surgeon: Irene Shipper, MD;  Location: [facility];  Service: Endoscopy;  Laterality: N/A;    Family History  Problem Relation Age of Onset  . Lymphoma Mother   . Dementia Mother   . Uterine cancer Mother 34       uterine sarcoma  . Hypothyroidism Mother   . Parkinsonism Father   . Heart disease Brother        CAD  . Heart attack Brother   . Heart disease Maternal Aunt   . Heart attack Maternal Aunt   . Colon cancer  Neg Hx   . Esophageal cancer Neg Hx   . Rectal cancer Neg Hx   . Stomach cancer Neg Hx     Social History:  reports that she has quit smoking. She has never used smokeless tobacco. She reports that she drinks about 1.8 oz of alcohol per week. She reports that she does not use drugs.  Allergies: No Known Allergies  Allergies as of 11/26/2017   No Known Allergies     Medication List        Accurate as of 11/26/17  9:36 AM. Always use your most recent med list.          AMBULATORY NON FORMULARY MEDICATION Medication Name: Mylanta and Lidocaine   Take 30-60 cc by mouth twice a day as needed   amLODipine 2.5 MG tablet Commonly known as:  NORVASC Take 1 tablet (2.5 mg total) by mouth daily.   aspirin 81 MG EC tablet Take 81 mg by mouth 2 (two) times daily.   b complex vitamins tablet Take 1 tablet by  mouth daily.   benzonatate 100 MG capsule Commonly known as:  TESSALON Take 1 capsule (100 mg total) by mouth 3 (three) times daily as needed.   cefdinir 300 MG capsule Commonly known as:  OMNICEF Take 1 capsule (300 mg total) by mouth 2 (two) times daily.   Cholecalciferol 1000 units tablet Take 1,000 Units by mouth daily.   Diclofenac Sodium 1.5 % Soln Commonly known as:  PENNSAID APPLY 3 TO 5 DROPS ON SKIN THREE TIMES A DAY FOR PAIN   diclofenac sodium 1 % Gel Commonly known as:  VOLTAREN Apply 4 g topically 4 (four) times daily.   enalapril 5 MG tablet Commonly known as:  VASOTEC TAKE ONE (1) TABLET BY MOUTH TWO (2) TIMES DAILY   EPIDRIN 65-100-325 MG capsule Generic drug:  isometheptene-acetaminophen-dichloralphenazone Take by mouth 2 (two) times daily as needed.   gi cocktail Susp suspension 43ml of 2% viscous lidocaine  58ml of Dicyclomine (10mg /59ml)  239ml of Maalox (400mg )  Take 5-45ml every 4-6 hours for esophageal spasms   HYDROcodone-homatropine 5-1.5 MG/5ML syrup Commonly known as:  HYCODAN Take 5 mLs by mouth every 8 (eight) hours as needed for cough.   hyoscyamine 0.125 MG SL tablet Commonly known as:  LEVSIN SL DISSOLVE 1-2 TABLETS UNDER THE TONGUE EVERY FOUR HOURS AS NEEDED FOR PAIN   levothyroxine 25 MCG tablet Commonly known as:  SYNTHROID, LEVOTHROID TAKE ONE TABLET BY MOUTH EVERY DAY BEFORE BREAKFAST   LORazepam 0.5 MG tablet Commonly known as:  ATIVAN Take 1 by mouth every night at bedtime as needed for sleep   Lutein-Zeaxanthin 25-5 MG Caps Take by mouth. Takes one daily   meloxicam 15 MG tablet Commonly known as:  MOBIC TAKE 1/2-1 TABLET BY MOUTH ONCE DAILY.   methocarbamol 500 MG tablet Commonly known as:  ROBAXIN TAKE ONE (1) TABLET BY MOUTH FOUR (4) TIMES DAILY AS NEEDED   multivitamin,tx-minerals tablet Take 1 tablet by mouth daily.   Omega 3 1000 MG Caps Take 2 capsules by mouth daily.   pantoprazole 40 MG  tablet Commonly known as:  PROTONIX Take 1 tablet (40 mg total) by mouth daily.   polyethylene glycol powder powder Commonly known as:  GLYCOLAX/MIRALAX Take 255 g by mouth daily. Mix 17g of Miralax in 8oz of water daily   pregabalin 150 MG capsule Commonly known as:  LYRICA Take 1 capsule (150 mg total) by mouth 2 (two) times daily.  PROBIOTIC ACIDOPHILUS PO Take by mouth. Takes one daily   SENNA LAX PO Take by mouth. As needed          Review of Systems            Recently having some issues with nasal congestion and sneezing/coughing   Examination:    BP 124/68 (BP Location: Left Arm, Patient Position: Sitting, Cuff Size: Normal)   Pulse 62   Ht 5\' 7"  (1.702 m)   Wt 159 lb (72.1 kg)   SpO2 99%   BMI 24.90 kg/m   Thyroid not palpable Normal biceps relaxation  Assessment:  HYPOTHYROIDISM, mild with probably mild symptoms that are improved with levothyroxine 25 mcg Her baseline symptoms of fatigue, difficulty with sleep, some hair loss and difficulty with weight are improved TSH has improved further despite no change in dosage last October   PLAN:   She will continue taking the 25 g levothyroxine  Follow-up in 6 months with repeat thyroid levels   Elayne Snare 11/26/2017, 9:36 AM     Note: This office note was prepared with Dragon voice recognition system technology. Any transcriptional errors that result from this process are unintentional.

## 2017-11-26 ENCOUNTER — Ambulatory Visit: Payer: Medicare Other | Admitting: Endocrinology

## 2017-11-26 ENCOUNTER — Encounter: Payer: Self-pay | Admitting: Endocrinology

## 2017-11-26 VITALS — BP 124/68 | HR 62 | Ht 67.0 in | Wt 159.0 lb

## 2017-11-26 DIAGNOSIS — E063 Autoimmune thyroiditis: Secondary | ICD-10-CM | POA: Diagnosis not present

## 2017-12-13 ENCOUNTER — Other Ambulatory Visit: Payer: Self-pay | Admitting: Physician Assistant

## 2017-12-14 ENCOUNTER — Other Ambulatory Visit: Payer: Self-pay | Admitting: Internal Medicine

## 2017-12-25 IMAGING — CT CT HEART SCORING
2 series · 16 of 20 positions shown, 18 images · non-contrast
Comparison: None.

EXAM:
OVER-READ INTERPRETATION  CT CHEST

The following report is an over-read performed by radiologist Dr.
Liechti Vassalli [REDACTED] on 06/27/2015. This
over-read does not include interpretation of cardiac or coronary
anatomy or pathology. The coronary calcium score interpretation by
the cardiologist is attached.
CLINICAL DATA: Risk stratification
Coronary Calcium Score
TECHNIQUE: The patient was scanned on a Siemens Sensation 16 slice scanner.
Axial non-contrast 3mm slices were carried out through the heart.
The data set was analyzed on a dedicated work station and scored
using the Agatson method.

[Series 2: casc 3.0 i36f 2 bestdiast 70 % · axial · 0.38mm/px · z∈[-124,-40]mm · 8 of 38 slices shown, 10 images]
[im 5/38  vessel]
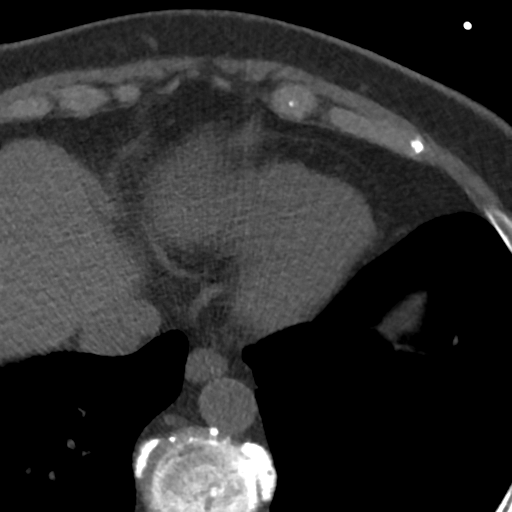
[im 5/38  lung]
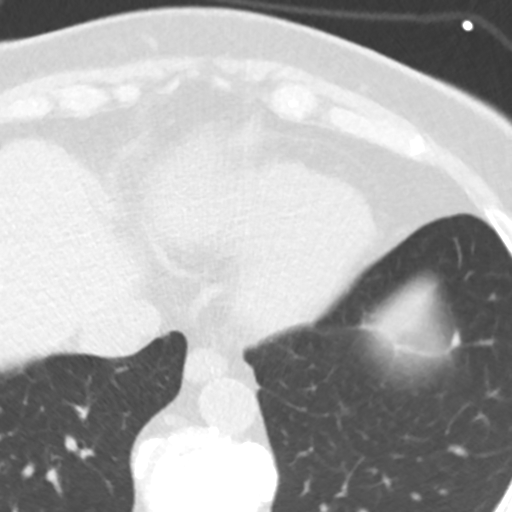
[im 9/38  vessel]
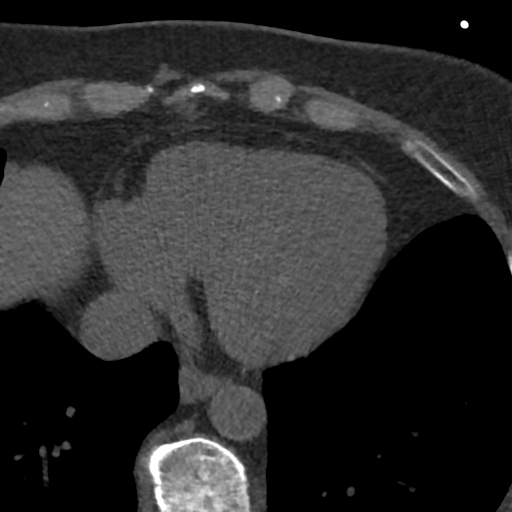
[im 13/38  vessel]
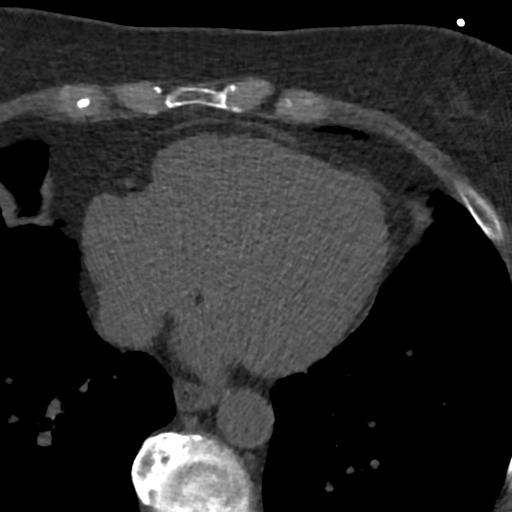
[im 17/38  vessel]
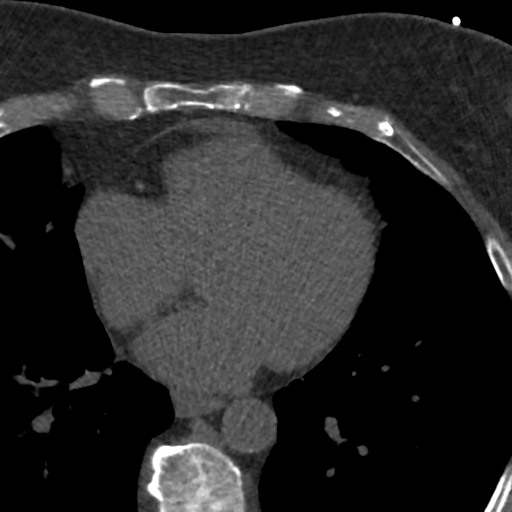
[im 21/38  vessel]
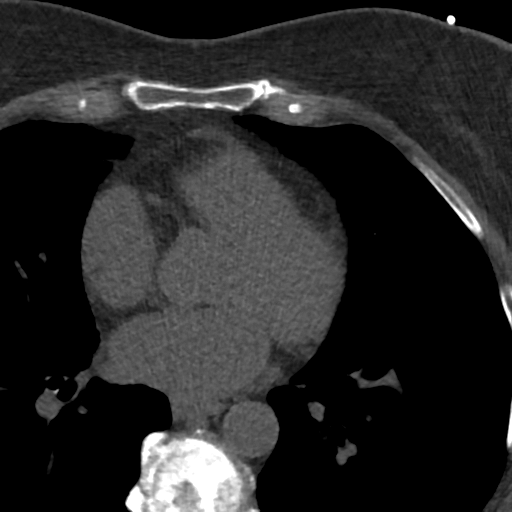
[im 21/38  lung]
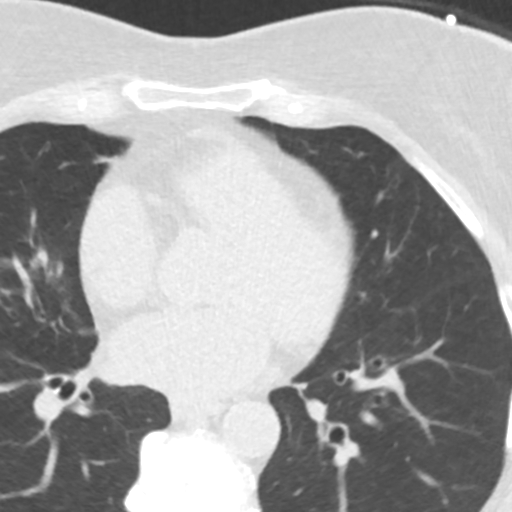
[im 25/38  vessel]
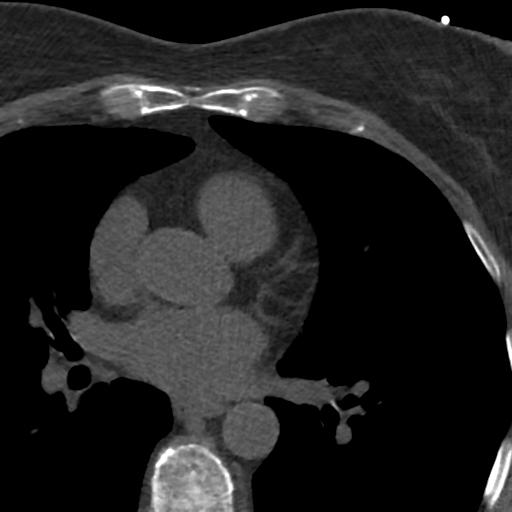
[im 29/38  vessel]
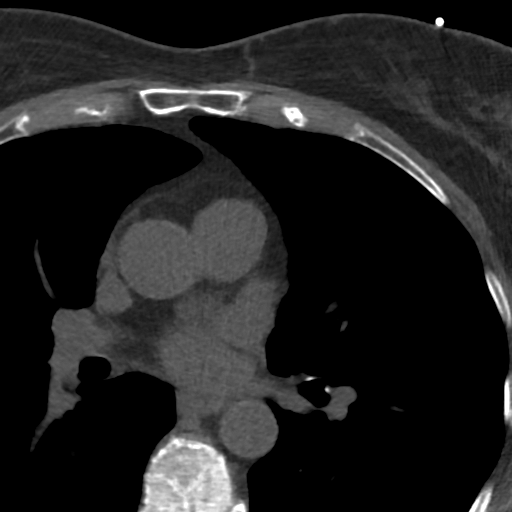
[im 33/38  vessel]
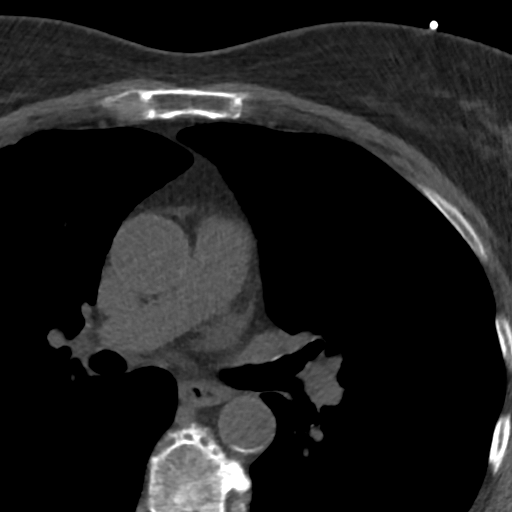

[Series 4: lung st 70 % · axial · 0.61mm/px · z∈[-124,-40]mm · 8 of 38 slices shown]
[im 5/38  lung]
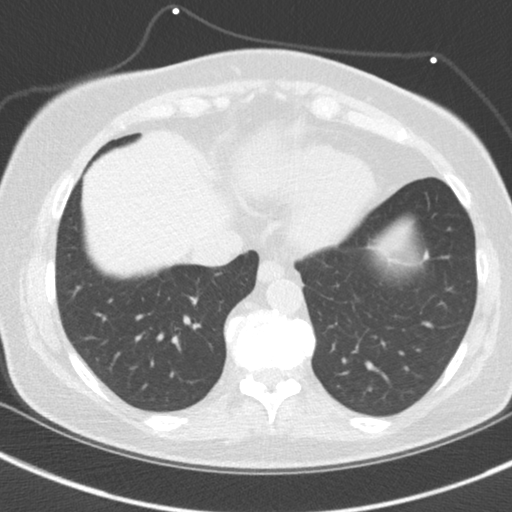
[im 9/38  lung]
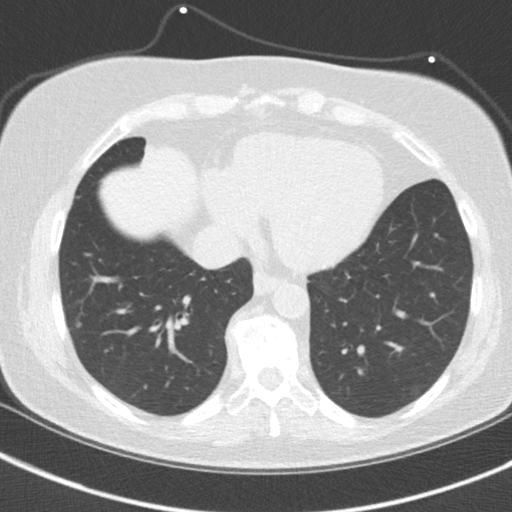
[im 13/38  lung]
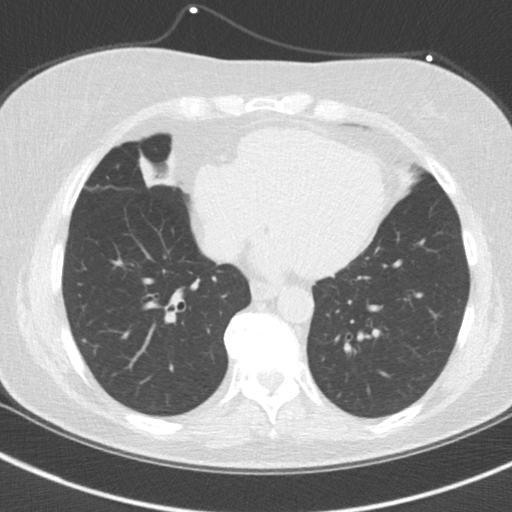
[im 17/38  lung]
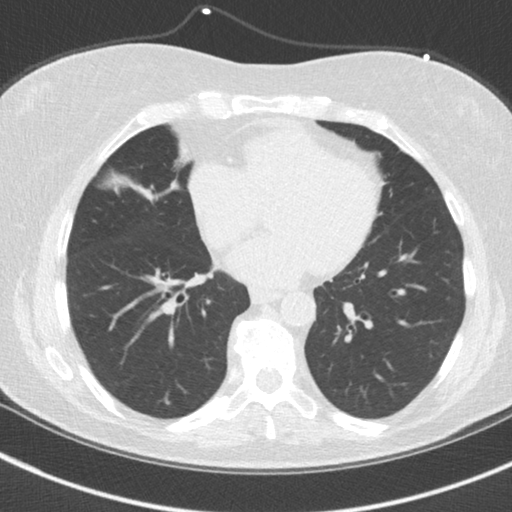
[im 21/38  lung]
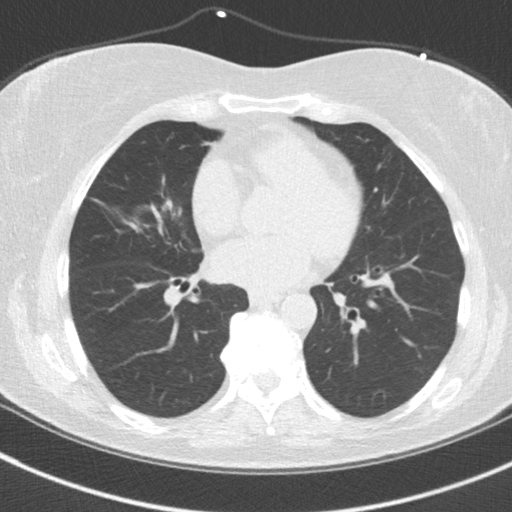
[im 25/38  lung]
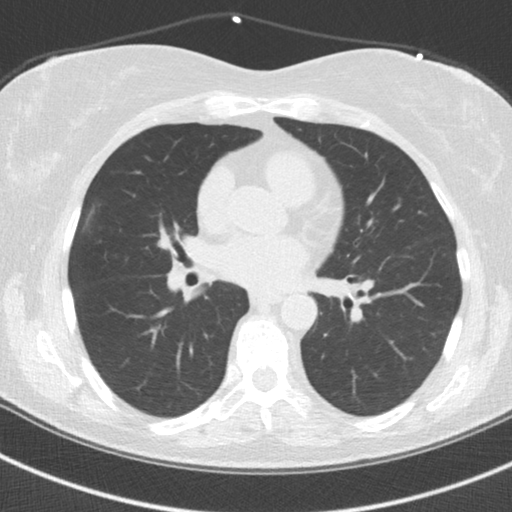
[im 29/38  lung]
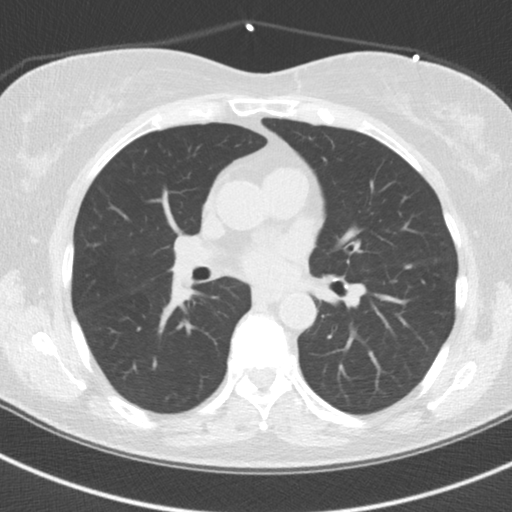
[im 33/38  lung]
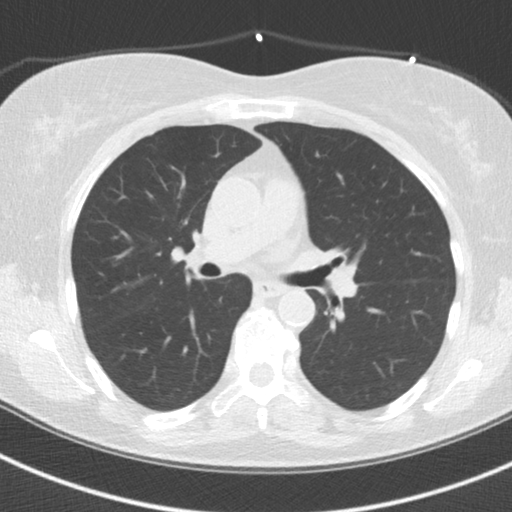

[16 of 20 positions shown; findings below may reference images not displayed]

FINDINGS: Mild linear scarring in the right middle lobe. No focal
consolidation.

No suspicious pulmonary nodules.

No suspicious mediastinal lymphadenopathy. Visualized upper abdomen
is unremarkable.

Degenerative changes of the visualized thoracolumbar spine.
IMPRESSION: No significant extracardiac findings.
FINDINGS: Non-cardiac: No significant non cardiac findings on limited lung and
soft tissue windows. See separate report from [REDACTED].

Ascending Aorta:  29 mm

Pericardium: Normal

Coronary arteries:  No calcium detected
IMPRESSION: Coronary calcium score of 0 .

Sibah Rez

## 2018-01-14 ENCOUNTER — Other Ambulatory Visit: Payer: Self-pay | Admitting: Internal Medicine

## 2018-05-03 ENCOUNTER — Other Ambulatory Visit: Payer: Self-pay | Admitting: Internal Medicine

## 2018-05-03 NOTE — Telephone Encounter (Signed)
Patient is calling to see if these can be refilled since she has an appointment on 11/13. She is completely out.

## 2018-05-04 ENCOUNTER — Other Ambulatory Visit: Payer: Self-pay | Admitting: Internal Medicine

## 2018-05-11 ENCOUNTER — Encounter: Payer: Self-pay | Admitting: Internal Medicine

## 2018-05-11 ENCOUNTER — Ambulatory Visit (INDEPENDENT_AMBULATORY_CARE_PROVIDER_SITE_OTHER): Payer: Medicare Other | Admitting: Internal Medicine

## 2018-05-11 ENCOUNTER — Other Ambulatory Visit (INDEPENDENT_AMBULATORY_CARE_PROVIDER_SITE_OTHER): Payer: Medicare Other

## 2018-05-11 VITALS — BP 122/68 | HR 75 | Temp 97.8°F | Ht 67.0 in | Wt 157.0 lb

## 2018-05-11 DIAGNOSIS — Z0000 Encounter for general adult medical examination without abnormal findings: Secondary | ICD-10-CM

## 2018-05-11 DIAGNOSIS — F411 Generalized anxiety disorder: Secondary | ICD-10-CM | POA: Diagnosis not present

## 2018-05-11 DIAGNOSIS — Z8249 Family history of ischemic heart disease and other diseases of the circulatory system: Secondary | ICD-10-CM

## 2018-05-11 DIAGNOSIS — D509 Iron deficiency anemia, unspecified: Secondary | ICD-10-CM | POA: Diagnosis not present

## 2018-05-11 DIAGNOSIS — I10 Essential (primary) hypertension: Secondary | ICD-10-CM | POA: Diagnosis not present

## 2018-05-11 DIAGNOSIS — M545 Low back pain, unspecified: Secondary | ICD-10-CM

## 2018-05-11 DIAGNOSIS — R079 Chest pain, unspecified: Secondary | ICD-10-CM

## 2018-05-11 LAB — BASIC METABOLIC PANEL
BUN: 18 mg/dL (ref 6–23)
CALCIUM: 9.3 mg/dL (ref 8.4–10.5)
CO2: 29 meq/L (ref 19–32)
CREATININE: 0.91 mg/dL (ref 0.40–1.20)
Chloride: 105 mEq/L (ref 96–112)
GFR: 64.69 mL/min (ref >60.00)
GLUCOSE: 78 mg/dL (ref 70–99)
Potassium: 4.8 mEq/L (ref 3.5–5.1)
SODIUM: 140 meq/L (ref 135–145)

## 2018-05-11 LAB — CBC WITH DIFFERENTIAL/PLATELET
BASOS ABS: 0.1 10*3/uL (ref 0.0–0.1)
Basophils Relative: 1.2 % (ref 0.0–3.0)
EOS PCT: 1.9 % (ref 0.0–5.0)
Eosinophils Absolute: 0.1 10*3/uL (ref 0.0–0.7)
HCT: 37.8 % (ref 36.0–46.0)
HEMOGLOBIN: 12.8 g/dL (ref 12.0–15.0)
Lymphocytes Relative: 28.6 % (ref 12.0–46.0)
Lymphs Abs: 2 10*3/uL (ref 0.7–4.0)
MCHC: 33.9 g/dL (ref 30.0–36.0)
MCV: 93.6 fl (ref 78.0–100.0)
MONOS PCT: 7.5 % (ref 3.0–12.0)
Monocytes Absolute: 0.5 10*3/uL (ref 0.1–1.0)
NEUTROS PCT: 60.8 % (ref 43.0–77.0)
Neutro Abs: 4.3 10*3/uL (ref 1.4–7.7)
Platelets: 282 10*3/uL (ref 150.0–400.0)
RBC: 4.03 Mil/uL (ref 3.87–5.11)
RDW: 14.4 % (ref 11.5–15.5)
WBC: 7.1 10*3/uL (ref 4.0–10.5)

## 2018-05-11 LAB — URINALYSIS
Bilirubin Urine: NEGATIVE
Hgb urine dipstick: NEGATIVE
KETONES UR: NEGATIVE
LEUKOCYTES UA: NEGATIVE
NITRITE: NEGATIVE
Specific Gravity, Urine: <=1.005 — AB (ref 1.000–1.030)
TOTAL PROTEIN, URINE-UPE24: NEGATIVE
Urine Glucose: NEGATIVE
Urobilinogen, UA: 0.2 (ref 0.0–1.0)
pH: 6 (ref 5.0–8.0)

## 2018-05-11 LAB — HEPATIC FUNCTION PANEL
ALBUMIN: 4.3 g/dL (ref 3.5–5.2)
ALK PHOS: 57 U/L (ref 39–117)
ALT: 19 U/L (ref 0–35)
AST: 26 U/L (ref 0–37)
BILIRUBIN TOTAL: 0.3 mg/dL (ref 0.2–1.2)
Bilirubin, Direct: 0.1 mg/dL (ref 0.0–0.3)
Total Protein: 6.7 g/dL (ref 6.0–8.3)

## 2018-05-11 LAB — TSH: TSH: 3.33 u[IU]/mL (ref 0.35–4.50)

## 2018-05-11 MED ORDER — PREGABALIN 150 MG PO CAPS: 150.0000 mg | ORAL_CAPSULE | Freq: Two times a day (BID) | ORAL | 1 refills | Status: DC

## 2018-05-11 MED ORDER — AMLODIPINE BESYLATE 2.5 MG PO TABS: 2.5000 mg | ORAL_TABLET | Freq: Every day | ORAL | 3 refills | Status: DC

## 2018-05-11 MED ORDER — ENALAPRIL MALEATE 5 MG PO TABS: ORAL_TABLET | ORAL | 3 refills | Status: DC

## 2018-05-11 MED ORDER — PANTOPRAZOLE SODIUM 40 MG PO TBEC: 40.0000 mg | DELAYED_RELEASE_TABLET | Freq: Every day | ORAL | 3 refills | Status: DC

## 2018-05-11 MED ORDER — MELOXICAM 15 MG PO TABS: ORAL_TABLET | ORAL | 3 refills | Status: DC

## 2018-05-11 MED ORDER — LEVOTHYROXINE SODIUM 25 MCG PO TABS: ORAL_TABLET | ORAL | 3 refills | Status: DC

## 2018-05-11 NOTE — Progress Notes (Signed)
Subjective:  Patient ID: Haley Armstrong, female    DOB: [birth year]  Age: 72 y.o. MRN: [id number]  CC: No chief complaint on file.   HPI Haley Armstrong presents for LBP, HTN, hypothyroidism f/u Husband is 62 Well exam  Outpatient Medications Prior to Visit  Medication Sig Dispense Refill  . Alum & Mag Hydroxide-Simeth (GI COCKTAIL) SUSP suspension 57ml of 2% viscous lidocaine  26ml of Dicyclomine (10mg /48ml)  27ml of Maalox (400mg )  Take 5-28ml every 4-6 hours for esophageal spasms 1200 mL 1  . AMBULATORY NON FORMULARY MEDICATION Medication Name: Mylanta and Lidocaine   Take 30-60 cc by mouth twice a day as needed 30 application 0  . amLODipine (NORVASC) 2.5 MG tablet TAKE 1 TABLET BY MOUTH EVERY DAY 90 tablet 0  . APAP-Isometheptene-Dichloral (EPIDRIN) 325-65-100 MG CAPS Take by mouth 2 (two) times daily as needed.     Marland Kitchen b complex vitamins tablet Take 1 tablet by mouth daily.      . cefdinir (OMNICEF) 300 MG capsule Take 1 capsule (300 mg total) by mouth 2 (two) times daily. 20 capsule 0  . Cholecalciferol 1000 UNITS tablet Take 1,000 Units by mouth daily.      . Diclofenac Sodium (PENNSAID) 1.5 % SOLN APPLY 3 TO 5 DROPS ON SKIN THREE TIMES A DAY FOR PAIN 150 mL 2  . diclofenac sodium (VOLTAREN) 1 % GEL Apply 4 g topically 4 (four) times daily. 100 Tube 11  . enalapril (VASOTEC) 5 MG tablet TAKE ONE (1) TABLET BY MOUTH TWO (2) TIMES DAILY 180 tablet 1  . enalapril (VASOTEC) 5 MG tablet TAKE 1 TABLET BY MOUTH TWICE A DAY 180 tablet 0  . HYDROcodone-homatropine (HYCODAN) 5-1.5 MG/5ML syrup Take 5 mLs by mouth every 8 (eight) hours as needed for cough. 75 mL 0  . hyoscyamine (LEVBID) 0.375 MG 12 hr tablet TAKE ONE (1) TABLET BY MOUTH TWO (2) TIMES DAILY 180 tablet 0  . hyoscyamine (LEVSIN SL) 0.125 MG SL tablet DISSOLVE 1-2 TABLETS UNDER THE TONGUE EVERY FOUR HOURS AS NEEDED FOR PAIN 100 tablet 2  . levothyroxine (SYNTHROID, LEVOTHROID) 25 MCG tablet TAKE ONE TABLET BY MOUTH  EVERY DAY BEFORE BREAKFAST 90 tablet 2  . LORazepam (ATIVAN) 0.5 MG tablet Take 1 by mouth every night at bedtime as needed for sleep 30 tablet 3  . Lutein-Zeaxanthin 25-5 MG CAPS Take by mouth. Takes one daily    . LYRICA 150 MG capsule TAKE 1 CAPSULE BY MOUTH TWICE A DAY 60 capsule 5  . meloxicam (MOBIC) 15 MG tablet TAKE 1/2-1 TABLET BY MOUTH ONCE DAILY. 90 tablet 0  . methocarbamol (ROBAXIN) 500 MG tablet TAKE ONE (1) TABLET BY MOUTH FOUR (4) TIMES DAILY AS NEEDED 120 tablet 1  . Multiple Vitamins-Minerals (MULTIVITAMIN,TX-MINERALS) tablet Take 1 tablet by mouth daily.      . Omega 3 1000 MG CAPS Take 2 capsules by mouth daily.    . pantoprazole (PROTONIX) 40 MG tablet Take 1 tablet (40 mg total) by mouth daily. 90 tablet 3  . polyethylene glycol powder (GLYCOLAX/MIRALAX) powder Take 255 g by mouth daily. Mix 17g of Miralax in 8oz of water daily 255 g 3  . Sennosides (SENNA LAX PO) Take by mouth. As needed     . aspirin 81 MG EC tablet Take 81 mg by mouth 2 (two) times daily.      . benzonatate (TESSALON) 100 MG capsule Take 1 capsule (100 mg total) by mouth 3 (three) times daily  as needed. (Patient not taking: Reported on 05/11/2018) 20 capsule 0  . Lactobacillus (PROBIOTIC ACIDOPHILUS PO) Take by mouth. Takes one daily     Facility-Administered Medications Prior to Visit  Medication Dose Route Frequency Provider Last Rate Last Dose  . 0.9 %  sodium chloride infusion  500 mL Intravenous Continuous Irene Shipper, MD      . triamcinolone acetonide (KENALOG) 10 MG/ML injection 10 mg  10 mg Other Once Regal, Norman S, DPM        ROS: Review of Systems  Constitutional: Negative for activity change, appetite change, chills, fatigue and unexpected weight change.  HENT: Negative for congestion, mouth sores and sinus pressure.   Eyes: Negative for visual disturbance.  Respiratory: Negative for cough and chest tightness.   Gastrointestinal: Negative for abdominal pain and nausea.    Genitourinary: Negative for difficulty urinating, frequency and vaginal pain.  Musculoskeletal: Positive for back pain. Negative for gait problem.  Skin: Negative for pallor and rash.  Neurological: Negative for dizziness, tremors, weakness, numbness and headaches.  Psychiatric/Behavioral: Negative for confusion, sleep disturbance and suicidal ideas. The patient is nervous/anxious.     Objective:  BP 122/68 (BP Location: Left Arm, Patient Position: Sitting, Cuff Size: Normal)   Pulse 75   Temp 97.8 F (36.6 C) (Oral)   Ht 5\' 7"  (1.702 m)   Wt 157 lb (71.2 kg)   SpO2 98%   BMI 24.59 kg/m   BP Readings from Last 3 Encounters:  05/11/18 122/68  11/26/17 124/68  11/17/17 110/62    Wt Readings from Last 3 Encounters:  05/11/18 157 lb (71.2 kg)  11/26/17 159 lb (72.1 kg)  11/17/17 158 lb 1.3 oz (71.7 kg)    Physical Exam  Constitutional: She appears well-developed. No distress.  HENT:  Head: Normocephalic.  Right Ear: External ear normal.  Left Ear: External ear normal.  Nose: Nose normal.  Mouth/Throat: Oropharynx is clear and moist.  Eyes: Pupils are equal, round, and reactive to light. Conjunctivae are normal. Right eye exhibits no discharge. Left eye exhibits no discharge.  Neck: Normal range of motion. Neck supple. No JVD present. No tracheal deviation present. No thyromegaly present.  Cardiovascular: Normal rate, regular rhythm and normal heart sounds.  Pulmonary/Chest: No stridor. No respiratory distress. She has no wheezes.  Abdominal: Soft. Bowel sounds are normal. She exhibits no distension and no mass. There is no tenderness. There is no rebound and no guarding.  Musculoskeletal: She exhibits no edema or tenderness.  Lymphadenopathy:    She has no cervical adenopathy.  Neurological: She displays normal reflexes. No cranial nerve deficit. She exhibits normal muscle tone. Coordination normal.  Skin: No rash noted. No erythema.  Psychiatric: She has a normal mood  and affect. Her behavior is normal. Judgment and thought content normal.  LS tender  Lab Results  Component Value Date   WBC 4.8 07/22/2017   HGB 13.8 07/22/2017   HCT 40.5 07/22/2017   PLT 253.0 07/22/2017   GLUCOSE 112 (H) 07/22/2017   CHOL 183 07/22/2017   TRIG 95.0 07/22/2017   HDL 56.40 07/22/2017   LDLCALC 107 (H) 07/22/2017   ALT 25 07/22/2017   AST 28 07/22/2017   NA 141 07/22/2017   K 4.6 07/22/2017   CL 106 07/22/2017   CREATININE 0.89 07/22/2017   BUN 20 07/22/2017   CO2 27 07/22/2017   TSH 2.50 11/19/2017   INR 0.90 08/05/2009   HGBA1C 5.8 05/08/2016    Ct Coronary Morph W/cta  Cor W/score W/ca W/cm &/or Wo/cm  Addendum Date: 06/25/2016   ADDENDUM REPORT: 06/25/2016 17:57 CLINICAL DATA:  Chest pain EXAM: Cardiac CTA MEDICATIONS: Sub lingual nitro. 4mg  and lopressor 2.5mg  IV TECHNIQUE: The patient was scanned on a Philips 962 slice scanner. Gantry rotation speed was 270 msecs. Collimation was .82mm. A 100 kV prospective scan was triggered in the descending thoracic aorta at 111 HU's with 5% padding centered around 78% of the R-R interval. Average HR during the scan was 60 bpm. The 3D data set was interpreted on a dedicated work station using MPR, MIP and VRT modes. A total of 80cc of contrast was used. FINDINGS: Non-cardiac: See separate report from [facility]. Calcium Score:  0 Agatston units. Coronary Arteries: Right dominant with no anomalies LM:  No plaque or stenosis. LAD system: Distal LAD was relatively small but no plaque or stenosis noted. There was a moderate to large first diagonal. Circumflex system: No plaque or stenosis. Small ramus and large PLOM noted. RCA:  No plaque or stenosis. IMPRESSION: 1. Coronary artery calcium score of 0 Agatston units, suggesting low risk for future cardiac events. 2.  No plaque or stenosis noted in the coronary arteries. Dalton Mclean Electronically Signed   By: Loralie Champagne M.D.   On: 06/25/2016 17:57   Result Date:  06/25/2016 EXAM: OVER-READ INTERPRETATION  CT CHEST The following report is an over-read performed by radiologist Dr. Collene Leyden [facility] on 06/25/2016. This over-read does not include interpretation of cardiac or coronary anatomy or pathology. The coronary CTA interpretation by the cardiologist is attached. COMPARISON:  Chest CT 06/27/2015 FINDINGS: Cardiovascular: Heart is normal size. Visualized aorta is normal caliber with scattered calcifications in the aortic arch and descending thoracic aorta. Mediastinum/Nodes: No adenopathy in the visualized axillae, mediastinum or hila. Lungs/Pleura: Linear densities in the right middle lobe are stable since prior study compatible with scarring. No pleural effusions. Visualized left lung clear. Upper Abdomen: Imaging into the upper abdomen shows no acute findings. Prior cholecystectomy. Musculoskeletal: Chest wall soft tissues are unremarkable. No acute bony abnormality or focal bone lesion. Degenerative spurring in the thoracic spine. IMPRESSION: No acute or significant extracardiac abnormality. Electronically Signed: By: Rolm Baptise M.D. On: 06/25/2016 10:39    Assessment & Plan:   There are no diagnoses linked to this encounter.   No orders of the defined types were placed in this encounter.    Follow-up: No follow-ups on file.  Walker Kehr, MD

## 2018-05-11 NOTE — Assessment & Plan Note (Signed)
2017 CT Ca score was "0" F/u w/Dr Radford Pax

## 2018-05-11 NOTE — Assessment & Plan Note (Signed)
Amlodipine, Enalapril

## 2018-05-11 NOTE — Patient Instructions (Addendum)
Health Maintenance for Postmenopausal Women Menopause is a normal process in which your reproductive ability comes to an end. This process happens gradually over a span of months to years, usually between the ages of 22 and 9. Menopause is complete when you have missed 12 consecutive menstrual periods. It is important to talk with your health care provider about some of the most common conditions that affect postmenopausal women, such as heart disease, cancer, and bone loss (osteoporosis). Adopting a healthy lifestyle and getting preventive care can help to promote your health and wellness. Those actions can also lower your chances of developing some of these common conditions. What should I know about menopause? During menopause, you may experience a number of symptoms, such as:  Moderate-to-severe hot flashes.  Night sweats.  Decrease in sex drive.  Mood swings.  Headaches.  Tiredness.  Irritability.  Memory problems.  Insomnia.  Choosing to treat or not to treat menopausal changes is an individual decision that you make with your health care provider. What should I know about hormone replacement therapy and supplements? Hormone therapy products are effective for treating symptoms that are associated with menopause, such as hot flashes and night sweats. Hormone replacement carries certain risks, especially as you become older. If you are thinking about using estrogen or estrogen with progestin treatments, discuss the benefits and risks with your health care provider. What should I know about heart disease and stroke? Heart disease, heart attack, and stroke become more likely as you age. This may be due, in part, to the hormonal changes that your body experiences during menopause. These can affect how your body processes dietary fats, triglycerides, and cholesterol. Heart attack and stroke are both medical emergencies. There are many things that you can do to help prevent heart disease  and stroke:  Have your blood pressure checked at least every 1-2 years. High blood pressure causes heart disease and increases the risk of stroke.  If you are 53-22 years old, ask your health care provider if you should take aspirin to prevent a heart attack or a stroke.  Do not use any tobacco products, including cigarettes, chewing tobacco, or electronic cigarettes. If you need help quitting, ask your health care provider.  It is important to eat a healthy diet and maintain a healthy weight. ? Be sure to include plenty of vegetables, fruits, low-fat dairy products, and lean protein. ? Avoid eating foods that are high in solid fats, added sugars, or salt (sodium).  Get regular exercise. This is one of the most important things that you can do for your health. ? Try to exercise for at least 150 minutes each week. The type of exercise that you do should increase your heart rate and make you sweat. This is known as moderate-intensity exercise. ? Try to do strengthening exercises at least twice each week. Do these in addition to the moderate-intensity exercise.  Know your numbers.Ask your health care provider to check your cholesterol and your blood glucose. Continue to have your blood tested as directed by your health care provider.  What should I know about cancer screening? There are several types of cancer. Take the following steps to reduce your risk and to catch any cancer development as early as possible. Breast Cancer  Practice breast self-awareness. ? This means understanding how your breasts normally appear and feel. ? It also means doing regular breast self-exams. Let your health care provider know about any changes, no matter how small.  If you are 40  or older, have a clinician do a breast exam (clinical breast exam or CBE) every year. Depending on your age, family history, and medical history, it may be recommended that you also have a yearly breast X-ray (mammogram).  If you  have a family history of breast cancer, talk with your health care provider about genetic screening.  If you are at high risk for breast cancer, talk with your health care provider about having an MRI and a mammogram every year.  Breast cancer (BRCA) gene test is recommended for women who have family members with BRCA-related cancers. Results of the assessment will determine the need for genetic counseling and BRCA1 and for BRCA2 testing. BRCA-related cancers include these types: ? Breast. This occurs in males or females. ? Ovarian. ? Tubal. This may also be called fallopian tube cancer. ? Cancer of the abdominal or pelvic lining (peritoneal cancer). ? Prostate. ? Pancreatic.  Cervical, Uterine, and Ovarian Cancer Your health care provider may recommend that you be screened regularly for cancer of the pelvic organs. These include your ovaries, uterus, and vagina. This screening involves a pelvic exam, which includes checking for microscopic changes to the surface of your cervix (Pap test).  For women ages 21-65, health care providers may recommend a pelvic exam and a Pap test every three years. For women ages 79-65, they may recommend the Pap test and pelvic exam, combined with testing for human papilloma virus (HPV), every five years. Some types of HPV increase your risk of cervical cancer. Testing for HPV may also be done on women of any age who have unclear Pap test results.  Other health care providers may not recommend any screening for nonpregnant women who are considered low risk for pelvic cancer and have no symptoms. Ask your health care provider if a screening pelvic exam is right for you.  If you have had past treatment for cervical cancer or a condition that could lead to cancer, you need Pap tests and screening for cancer for at least 20 years after your treatment. If Pap tests have been discontinued for you, your risk factors (such as having a new sexual partner) need to be  reassessed to determine if you should start having screenings again. Some women have medical problems that increase the chance of getting cervical cancer. In these cases, your health care provider may recommend that you have screening and Pap tests more often.  If you have a family history of uterine cancer or ovarian cancer, talk with your health care provider about genetic screening.  If you have vaginal bleeding after reaching menopause, tell your health care provider.  There are currently no reliable tests available to screen for ovarian cancer.  Lung Cancer Lung cancer screening is recommended for adults 69-62 years old who are at high risk for lung cancer because of a history of smoking. A yearly low-dose CT scan of the lungs is recommended if you:  Currently smoke.  Have a history of at least 30 pack-years of smoking and you currently smoke or have quit within the past 15 years. A pack-year is smoking an average of one pack of cigarettes per day for one year.  Yearly screening should:  Continue until it has been 15 years since you quit.  Stop if you develop a health problem that would prevent you from having lung cancer treatment.  Colorectal Cancer  This type of cancer can be detected and can often be prevented.  Routine colorectal cancer screening usually begins at  age 42 and continues through age 45.  If you have risk factors for colon cancer, your health care provider may recommend that you be screened at an earlier age.  If you have a family history of colorectal cancer, talk with your health care provider about genetic screening.  Your health care provider may also recommend using home test kits to check for hidden blood in your stool.  A small camera at the end of a tube can be used to examine your colon directly (sigmoidoscopy or colonoscopy). This is done to check for the earliest forms of colorectal cancer.  Direct examination of the colon should be repeated every  5-10 years until age 71. However, if early forms of precancerous polyps or small growths are found or if you have a family history or genetic risk for colorectal cancer, you may need to be screened more often.  Skin Cancer  Check your skin from head to toe regularly.  Monitor any moles. Be sure to tell your health care provider: ? About any new moles or changes in moles, especially if there is a change in a mole's shape or color. ? If you have a mole that is larger than the size of a pencil eraser.  If any of your family members has a history of skin cancer, especially at a young age, talk with your health care provider about genetic screening.  Always use sunscreen. Apply sunscreen liberally and repeatedly throughout the day.  Whenever you are outside, protect yourself by wearing long sleeves, pants, a wide-brimmed hat, and sunglasses.  What should I know about osteoporosis? Osteoporosis is a condition in which bone destruction happens more quickly than new bone creation. After menopause, you may be at an increased risk for osteoporosis. To help prevent osteoporosis or the bone fractures that can happen because of osteoporosis, the following is recommended:  If you are 46-71 years old, get at least 1,000 mg of calcium and at least 600 mg of vitamin D per day.  If you are older than age 55 but younger than age 65, get at least 1,200 mg of calcium and at least 600 mg of vitamin D per day.  If you are older than age 54, get at least 1,200 mg of calcium and at least 800 mg of vitamin D per day.  Smoking and excessive alcohol intake increase the risk of osteoporosis. Eat foods that are rich in calcium and vitamin D, and do weight-bearing exercises several times each week as directed by your health care provider. What should I know about how menopause affects my mental health? Depression may occur at any age, but it is more common as you become older. Common symptoms of depression  include:  Low or sad mood.  Changes in sleep patterns.  Changes in appetite or eating patterns.  Feeling an overall lack of motivation or enjoyment of activities that you previously enjoyed.  Frequent crying spells.  Talk with your health care provider if you think that you are experiencing depression. What should I know about immunizations? It is important that you get and maintain your immunizations. These include:  Tetanus, diphtheria, and pertussis (Tdap) booster vaccine.  Influenza every year before the flu season begins.  Pneumonia vaccine.  Shingles vaccine.  Your health care provider may also recommend other immunizations. This information is not intended to replace advice given to you by your health care provider. Make sure you discuss any questions you have with your health care provider. Document Released: 08/07/2005  Document Revised: 01/03/2016 Document Reviewed: 03/19/2015 Elsevier Interactive Patient Education  2018 [employer] Inc.  

## 2018-05-11 NOTE — Assessment & Plan Note (Signed)
CBC

## 2018-05-11 NOTE — Assessment & Plan Note (Signed)
Lyrica bid Meloxicam qd  Potential benefits of a long term NSAIDs use as well as potential risks  and complications were explained to the patient and were aknowledged.

## 2018-05-11 NOTE — Assessment & Plan Note (Signed)
Rare use of Lorazepam  Potential benefits of a long term benzodiazepines  use as well as potential risks  and complications were explained to the patient and were aknowledged. 

## 2018-05-30 ENCOUNTER — Other Ambulatory Visit: Payer: Medicare Other

## 2018-06-01 ENCOUNTER — Ambulatory Visit: Payer: Medicare Other | Admitting: Endocrinology

## 2018-06-16 ENCOUNTER — Other Ambulatory Visit: Payer: Self-pay | Admitting: Internal Medicine

## 2018-06-16 MED ORDER — HYOSCYAMINE SULFATE ER 0.375 MG PO TB12: ORAL_TABLET | ORAL | 3 refills | Status: DC

## 2018-06-16 NOTE — Telephone Encounter (Signed)
Requested medication (s) are due for refill today: yes  Requested medication (s) are on the active medication list: yes  Last refill:  06/17/198 for 180 tabs  Future visit scheduled: no  Notes to clinic:  Prescribed by a historical provider.  Requested Prescriptions  Pending Prescriptions Disp Refills   hyoscyamine (LEVBID) 0.375 MG 12 hr tablet 180 tablet 0     Gastroenterology:  Antispasmodic Agents Passed - 06/16/2018  9:45 AM      Passed - Last Heart Rate in normal range    Pulse Readings from Last 1 Encounters:  05/11/18 75         Passed - Valid encounter within last 12 months    Recent Outpatient Visits          1 month ago Well adult exam   [place], [place]   7 months ago Acute bronchitis, unspecified organism   [place], Marvis Repress, [place]   1 year ago Well adult exam   [facility] -[facility] [name], Evie Lacks, MD   1 year ago Acute bilateral low back pain without sciatica   [place], NP   2 years ago Feeling of chest tightness   [place], Charlene Brooke, NP

## 2018-06-16 NOTE — Telephone Encounter (Signed)
Copied from [place] [id number]. Topic: Quick Communication - Rx Refill/Question >> Jun 16, 2018  9:39 AM Rutherford Nail, NT wrote: **Patient states that Dr Alain Marion has refilled this before. Would like to know if he is willing to refill since she has not seen Dr Henrene Pastor in a while.**  Medication: hyoscyamine (LEVBID) 0.375 MG 12 hr tablet  Has the patient contacted their pharmacy? Yes.   (Agent: If no, request that the patient contact the pharmacy for the refill.) (Agent: If yes, when and what did the pharmacy advise?)  Preferred Pharmacy (with phone number or street name): [id number] - [place], [place]: Please be advised that RX refills may take up to 3 business days. We ask that you follow-up with your pharmacy.

## 2018-06-16 NOTE — Telephone Encounter (Signed)
Patient states that Dr Alain Marion has refilled this before. Would like to know if he is willing to refill since she has not seen Dr Henrene Pastor in a while

## 2018-08-23 ENCOUNTER — Other Ambulatory Visit: Payer: Self-pay | Admitting: Internal Medicine

## 2018-11-08 ENCOUNTER — Ambulatory Visit: Payer: Medicare Other | Admitting: Internal Medicine

## 2018-12-16 ENCOUNTER — Other Ambulatory Visit: Payer: Self-pay | Admitting: Internal Medicine

## 2019-01-31 DIAGNOSIS — D0472 Carcinoma in situ of skin of left lower limb, including hip: Secondary | ICD-10-CM | POA: Diagnosis not present

## 2019-01-31 DIAGNOSIS — L308 Other specified dermatitis: Secondary | ICD-10-CM | POA: Diagnosis not present

## 2019-02-13 ENCOUNTER — Other Ambulatory Visit: Payer: Self-pay | Admitting: Internal Medicine

## 2019-03-15 DIAGNOSIS — M7712 Lateral epicondylitis, left elbow: Secondary | ICD-10-CM | POA: Insufficient documentation

## 2019-03-15 DIAGNOSIS — M25511 Pain in right shoulder: Secondary | ICD-10-CM | POA: Diagnosis not present

## 2019-03-15 DIAGNOSIS — M25522 Pain in left elbow: Secondary | ICD-10-CM | POA: Diagnosis not present

## 2019-04-07 DIAGNOSIS — X32XXXD Exposure to sunlight, subsequent encounter: Secondary | ICD-10-CM | POA: Diagnosis not present

## 2019-04-07 DIAGNOSIS — L821 Other seborrheic keratosis: Secondary | ICD-10-CM | POA: Diagnosis not present

## 2019-04-07 DIAGNOSIS — Z85828 Personal history of other malignant neoplasm of skin: Secondary | ICD-10-CM | POA: Diagnosis not present

## 2019-04-07 DIAGNOSIS — L570 Actinic keratosis: Secondary | ICD-10-CM | POA: Diagnosis not present

## 2019-04-07 DIAGNOSIS — Z08 Encounter for follow-up examination after completed treatment for malignant neoplasm: Secondary | ICD-10-CM | POA: Diagnosis not present

## 2019-04-11 ENCOUNTER — Other Ambulatory Visit: Payer: Self-pay | Admitting: Sports Medicine

## 2019-04-11 DIAGNOSIS — G8929 Other chronic pain: Secondary | ICD-10-CM

## 2019-04-11 DIAGNOSIS — M25511 Pain in right shoulder: Secondary | ICD-10-CM

## 2019-04-13 ENCOUNTER — Other Ambulatory Visit: Payer: Self-pay

## 2019-04-13 ENCOUNTER — Ambulatory Visit (INDEPENDENT_AMBULATORY_CARE_PROVIDER_SITE_OTHER): Payer: Medicare Other

## 2019-04-13 DIAGNOSIS — Z23 Encounter for immunization: Secondary | ICD-10-CM | POA: Diagnosis not present

## 2019-04-18 ENCOUNTER — Other Ambulatory Visit: Payer: Self-pay

## 2019-04-18 ENCOUNTER — Ambulatory Visit
Admission: RE | Admit: 2019-04-18 | Discharge: 2019-04-18 | Disposition: A | Discharge status: Home / Self Care | Payer: Medicare Other | Source: Ambulatory Visit | Attending: Sports Medicine | Admitting: Sports Medicine

## 2019-04-18 DIAGNOSIS — G8929 Other chronic pain: Secondary | ICD-10-CM

## 2019-04-18 DIAGNOSIS — M25511 Pain in right shoulder: Secondary | ICD-10-CM | POA: Diagnosis not present

## 2019-04-27 ENCOUNTER — Other Ambulatory Visit: Payer: Medicare Other

## 2019-04-27 DIAGNOSIS — M25511 Pain in right shoulder: Secondary | ICD-10-CM | POA: Diagnosis not present

## 2019-05-09 ENCOUNTER — Other Ambulatory Visit: Payer: Self-pay | Admitting: Internal Medicine

## 2019-05-09 DIAGNOSIS — M25511 Pain in right shoulder: Secondary | ICD-10-CM | POA: Diagnosis not present

## 2019-05-09 DIAGNOSIS — S43431D Superior glenoid labrum lesion of right shoulder, subsequent encounter: Secondary | ICD-10-CM | POA: Diagnosis not present

## 2019-05-09 DIAGNOSIS — M19011 Primary osteoarthritis, right shoulder: Secondary | ICD-10-CM | POA: Diagnosis not present

## 2019-05-14 ENCOUNTER — Other Ambulatory Visit: Payer: Self-pay | Admitting: Internal Medicine

## 2019-05-18 ENCOUNTER — Ambulatory Visit (INDEPENDENT_AMBULATORY_CARE_PROVIDER_SITE_OTHER): Payer: Medicare Other | Admitting: Internal Medicine

## 2019-05-18 ENCOUNTER — Encounter: Payer: Self-pay | Admitting: Internal Medicine

## 2019-05-18 ENCOUNTER — Ambulatory Visit (INDEPENDENT_AMBULATORY_CARE_PROVIDER_SITE_OTHER)
Admission: RE | Admit: 2019-05-18 | Discharge: 2019-05-18 | Disposition: A | Discharge status: Home / Self Care | Payer: Medicare Other | Source: Ambulatory Visit | Attending: Internal Medicine | Admitting: Internal Medicine

## 2019-05-18 ENCOUNTER — Other Ambulatory Visit (INDEPENDENT_AMBULATORY_CARE_PROVIDER_SITE_OTHER): Payer: Medicare Other

## 2019-05-18 ENCOUNTER — Other Ambulatory Visit: Payer: Self-pay

## 2019-05-18 VITALS — BP 130/72 | HR 71 | Temp 97.8°F | Ht 67.0 in | Wt 155.0 lb

## 2019-05-18 DIAGNOSIS — F411 Generalized anxiety disorder: Secondary | ICD-10-CM

## 2019-05-18 DIAGNOSIS — Z0000 Encounter for general adult medical examination without abnormal findings: Secondary | ICD-10-CM | POA: Diagnosis not present

## 2019-05-18 DIAGNOSIS — M12811 Other specific arthropathies, not elsewhere classified, right shoulder: Secondary | ICD-10-CM | POA: Insufficient documentation

## 2019-05-18 DIAGNOSIS — N959 Unspecified menopausal and perimenopausal disorder: Secondary | ICD-10-CM | POA: Diagnosis not present

## 2019-05-18 DIAGNOSIS — M75101 Unspecified rotator cuff tear or rupture of right shoulder, not specified as traumatic: Secondary | ICD-10-CM

## 2019-05-18 DIAGNOSIS — E785 Hyperlipidemia, unspecified: Secondary | ICD-10-CM | POA: Diagnosis not present

## 2019-05-18 DIAGNOSIS — I10 Essential (primary) hypertension: Secondary | ICD-10-CM

## 2019-05-18 LAB — CBC WITH DIFFERENTIAL/PLATELET
Basophils Absolute: 0.1 10*3/uL (ref 0.0–0.1)
Basophils Relative: 0.9 % (ref 0.0–3.0)
Eosinophils Absolute: 0.1 10*3/uL (ref 0.0–0.7)
Eosinophils Relative: 1.5 % (ref 0.0–5.0)
HCT: 39.3 % (ref 36.0–46.0)
Hemoglobin: 13.3 g/dL (ref 12.0–15.0)
Lymphocytes Relative: 22.1 % (ref 12.0–46.0)
Lymphs Abs: 1.8 10*3/uL (ref 0.7–4.0)
MCHC: 33.7 g/dL (ref 30.0–36.0)
MCV: 95.8 fl (ref 78.0–100.0)
Monocytes Absolute: 0.8 10*3/uL (ref 0.1–1.0)
Monocytes Relative: 9.7 % (ref 3.0–12.0)
Neutro Abs: 5.3 10*3/uL (ref 1.4–7.7)
Neutrophils Relative %: 65.8 % (ref 43.0–77.0)
Platelets: 324 10*3/uL (ref 150.0–400.0)
RBC: 4.1 Mil/uL (ref 3.87–5.11)
RDW: 13.4 % (ref 11.5–15.5)
WBC: 8 10*3/uL (ref 4.0–10.5)

## 2019-05-18 LAB — BASIC METABOLIC PANEL
BUN: 15 mg/dL (ref 6–23)
CO2: 27 mEq/L (ref 19–32)
Calcium: 9.2 mg/dL (ref 8.4–10.5)
Chloride: 100 mEq/L (ref 96–112)
Creatinine, Ser: 0.94 mg/dL (ref 0.40–1.20)
GFR: 58.46 mL/min — ABNORMAL LOW (ref >60.00)
Glucose, Bld: 92 mg/dL (ref 70–99)
Potassium: 4.7 mEq/L (ref 3.5–5.1)
Sodium: 136 mEq/L (ref 135–145)

## 2019-05-18 LAB — HEPATIC FUNCTION PANEL
ALT: 19 U/L (ref 0–35)
AST: 22 U/L (ref 0–37)
Albumin: 4.2 g/dL (ref 3.5–5.2)
Alkaline Phosphatase: 56 U/L (ref 39–117)
Bilirubin, Direct: 0.1 mg/dL (ref 0.0–0.3)
Total Bilirubin: 0.5 mg/dL (ref 0.2–1.2)
Total Protein: 6.7 g/dL (ref 6.0–8.3)

## 2019-05-18 LAB — TSH: TSH: 3.26 u[IU]/mL (ref 0.35–4.50)

## 2019-05-18 LAB — LIPID PANEL
Cholesterol: 163 mg/dL (ref 0–200)
HDL: 62.5 mg/dL (ref >39.00)
LDL Cholesterol: 88 mg/dL (ref 0–99)
NonHDL: 100.96
Total CHOL/HDL Ratio: 3
Triglycerides: 63 mg/dL (ref 0.0–149.0)
VLDL: 12.6 mg/dL (ref 0.0–40.0)

## 2019-05-18 MED ORDER — HYOSCYAMINE SULFATE ER 0.375 MG PO TB12: ORAL_TABLET | ORAL | 3 refills | Status: DC

## 2019-05-18 MED ORDER — PANTOPRAZOLE SODIUM 40 MG PO TBEC: 40.0000 mg | DELAYED_RELEASE_TABLET | Freq: Every day | ORAL | 3 refills | Status: DC

## 2019-05-18 MED ORDER — ENALAPRIL MALEATE 5 MG PO TABS: ORAL_TABLET | ORAL | 3 refills | Status: DC

## 2019-05-18 MED ORDER — LEVOTHYROXINE SODIUM 25 MCG PO TABS: ORAL_TABLET | ORAL | 3 refills | Status: DC

## 2019-05-18 MED ORDER — MELOXICAM 15 MG PO TABS: ORAL_TABLET | ORAL | 3 refills | Status: DC

## 2019-05-18 MED ORDER — LORAZEPAM 0.5 MG PO TABS: ORAL_TABLET | ORAL | 3 refills | Status: DC

## 2019-05-18 MED ORDER — METHOCARBAMOL 500 MG PO TABS: ORAL_TABLET | ORAL | 0 refills | Status: DC

## 2019-05-18 MED ORDER — PREGABALIN 150 MG PO CAPS: 150.0000 mg | ORAL_CAPSULE | Freq: Two times a day (BID) | ORAL | 1 refills | Status: DC

## 2019-05-18 MED ORDER — AMLODIPINE BESYLATE 2.5 MG PO TABS: 2.5000 mg | ORAL_TABLET | Freq: Every day | ORAL | 3 refills | Status: DC

## 2019-05-18 NOTE — Patient Instructions (Signed)
If you have medicare related insurance (such as traditional Medicare, Blue Cross Medicare, United HealthCare Medicare, or similar), Please make an appointment at the scheduling desk with Jill, the Wellness Health Coach, for your Wellness visit in this office, which is a benefit with your insurance.  

## 2019-05-18 NOTE — Assessment & Plan Note (Signed)
F/u w/Dr Veverly Fells S/p injection

## 2019-05-18 NOTE — Progress Notes (Signed)
Subjective:  Patient ID: Haley Armstrong, female    DOB: 04-21-1946  Age: 73 y.o. MRN: [id number]  CC: No chief complaint on file.   HPI AILA SAGEL presents for a well exam C/o R shoulder pain - torn her rotator cuff. Seeing Dr Veverly Fells F/u HTN, anxiety Workes at [employer] now.  Outpatient Medications Prior to Visit  Medication Sig Dispense Refill  . Alum & Mag Hydroxide-Simeth (GI COCKTAIL) SUSP suspension 43ml of 2% viscous lidocaine  29ml of Dicyclomine (10mg /75ml)  29ml of Maalox (400mg )  Take 5-43ml every 4-6 hours for esophageal spasms 1200 mL 1  . amLODipine (NORVASC) 2.5 MG tablet TAKE 1 TABLET BY MOUTH EVERY DAY 90 tablet 3  . APAP-Isometheptene-Dichloral (EPIDRIN) 325-65-100 MG CAPS Take by mouth 2 (two) times daily as needed.     Marland Kitchen b complex vitamins tablet Take 1 tablet by mouth daily.      . Cholecalciferol 1000 UNITS tablet Take 1,000 Units by mouth daily.      . Diclofenac Sodium (PENNSAID) 1.5 % SOLN APPLY 3 TO 5 DROPS ON SKIN THREE TIMES A DAY FOR PAIN 150 mL 2  . diclofenac sodium (VOLTAREN) 1 % GEL Apply 4 g topically 4 (four) times daily. 100 Tube 11  . enalapril (VASOTEC) 5 MG tablet TAKE ONE (1) TABLET BY MOUTH TWO (2) TIMES DAILY 180 tablet 3  . HYDROcodone-homatropine (HYCODAN) 5-1.5 MG/5ML syrup Take 5 mLs by mouth every 8 (eight) hours as needed for cough. 75 mL 0  . hyoscyamine (LEVBID) 0.375 MG 12 hr tablet TAKE ONE (1) TABLET BY MOUTH TWO (2) TIMES DAILY 180 tablet 3  . hyoscyamine (LEVSIN SL) 0.125 MG SL tablet DISSOLVE 1-2 TABLETS UNDER THE TONGUE EVERY FOUR HOURS AS NEEDED FOR PAIN 100 tablet 2  . levothyroxine (SYNTHROID, LEVOTHROID) 25 MCG tablet TAKE ONE TABLET BY MOUTH EVERY DAY BEFORE BREAKFAST 90 tablet 3  . LORazepam (ATIVAN) 0.5 MG tablet Take 1 by mouth every night at bedtime as needed for sleep 30 tablet 3  . Lutein-Zeaxanthin 25-5 MG CAPS Take by mouth. Takes one daily    . meloxicam (MOBIC) 15 MG tablet 1 po qd pc prn  90 tablet 3  . methocarbamol (ROBAXIN) 500 MG tablet TAKE ONE TABLET BY MOUTH FOUR TIMES DAILY AS NEEDED 120 tablet 0  . Multiple Vitamins-Minerals (MULTIVITAMIN,TX-MINERALS) tablet Take 1 tablet by mouth daily.      . Omega 3 1000 MG CAPS Take 2 capsules by mouth daily.    . pantoprazole (PROTONIX) 40 MG tablet TAKE 1 TABLET BY MOUTH EVERY DAY 90 tablet 3  . polyethylene glycol powder (GLYCOLAX/MIRALAX) powder Take 255 g by mouth daily. Mix 17g of Miralax in 8oz of water daily 255 g 3  . pregabalin (LYRICA) 150 MG capsule TAKE 1 CAPSULE BY MOUTH TWICE A DAY 180 capsule 1  . Sennosides (SENNA LAX PO) Take by mouth. As needed      Facility-Administered Medications Prior to Visit  Medication Dose Route Frequency Provider Last Rate Last Dose  . 0.9 %  sodium chloride infusion  500 mL Intravenous Continuous Irene Shipper, MD      . triamcinolone acetonide (KENALOG) 10 MG/ML injection 10 mg  10 mg Other Once Regal, Norman S, DPM        ROS: Review of Systems  Constitutional: Negative for activity change, appetite change, chills, fatigue and unexpected weight change.  HENT: Negative for congestion, mouth sores and sinus pressure.  Eyes: Negative for visual disturbance.  Respiratory: Negative for cough and chest tightness.   Gastrointestinal: Negative for abdominal pain and nausea.  Genitourinary: Negative for difficulty urinating, frequency and vaginal pain.  Musculoskeletal: Positive for arthralgias. Negative for back pain and gait problem.  Skin: Negative for pallor and rash.  Neurological: Negative for dizziness, tremors, weakness, numbness and headaches.  Psychiatric/Behavioral: Negative for confusion and sleep disturbance. The patient is nervous/anxious.     Objective:  BP 130/72 (BP Location: Left Arm, Patient Position: Sitting, Cuff Size: Normal)   Pulse 71   Temp 97.8 F (36.6 C) (Oral)   Ht 5\' 7"  (1.702 m)   Wt 155 lb (70.3 kg)   SpO2 98%   BMI 24.28 kg/m   BP Readings  from Last 3 Encounters:  05/18/19 130/72  05/11/18 122/68  11/26/17 124/68    Wt Readings from Last 3 Encounters:  05/18/19 155 lb (70.3 kg)  05/11/18 157 lb (71.2 kg)  11/26/17 159 lb (72.1 kg)    Physical Exam Constitutional:      General: She is not in acute distress.    Appearance: She is well-developed.  HENT:     Head: Normocephalic.     Right Ear: External ear normal.     Left Ear: External ear normal.     Nose: Nose normal.  Eyes:     General:        Right eye: No discharge.        Left eye: No discharge.     Conjunctiva/sclera: Conjunctivae normal.     Pupils: Pupils are equal, round, and reactive to light.  Neck:     Musculoskeletal: Normal range of motion and neck supple.     Thyroid: No thyromegaly.     Vascular: No JVD.     Trachea: No tracheal deviation.  Cardiovascular:     Rate and Rhythm: Normal rate and regular rhythm.     Heart sounds: Normal heart sounds.  Pulmonary:     Effort: No respiratory distress.     Breath sounds: No stridor. No wheezing.  Abdominal:     General: Bowel sounds are normal. There is no distension.     Palpations: Abdomen is soft. There is no mass.     Tenderness: There is no abdominal tenderness. There is no guarding or rebound.  Musculoskeletal:        General: Tenderness present.  Lymphadenopathy:     Cervical: No cervical adenopathy.  Skin:    Findings: No erythema or rash.  Neurological:     Cranial Nerves: No cranial nerve deficit.     Motor: No abnormal muscle tone.     Coordination: Coordination normal.     Deep Tendon Reflexes: Reflexes normal.  Psychiatric:        Behavior: Behavior normal.        Thought Content: Thought content normal.        Judgment: Judgment normal.    R shoulder is tender w/ROM   Lab Results  Component Value Date   WBC 7.1 05/11/2018   HGB 12.8 05/11/2018   HCT 37.8 05/11/2018   PLT 282.0 05/11/2018   GLUCOSE 78 05/11/2018   CHOL 183 07/22/2017   TRIG 95.0 07/22/2017   HDL  56.40 07/22/2017   LDLCALC 107 (H) 07/22/2017   ALT 19 05/11/2018   AST 26 05/11/2018   NA 140 05/11/2018   K 4.8 05/11/2018   CL 105 05/11/2018   CREATININE 0.91 05/11/2018   BUN 18  05/11/2018   CO2 29 05/11/2018   TSH 3.33 05/11/2018   INR 0.90 08/05/2009   HGBA1C 5.8 05/08/2016    Mr Shoulder Right Wo Contrast  Result Date: 04/18/2019 CLINICAL DATA:  Right shoulder pain after recent fall. Limited range of motion. EXAM: MRI OF THE RIGHT SHOULDER WITHOUT CONTRAST TECHNIQUE: Multiplanar, multisequence MR imaging of the shoulder was performed. No intravenous contrast was administered. COMPARISON:  07/11/2007 FINDINGS: Rotator cuff: Supraspinatus tendinosis with low-grade articular surface tear with interstitial extension of the mid tendon in the region of the critical zone (series 5, image 9). Infraspinatus tendinosis without tear. Subscapularis tendinosis with high-grade partial thickness tear of the superior and mid portions of the distal tendon. Teres minor intact. Muscles: No atrophy or abnormal signal of the muscles of the rotator cuff. Biceps long head: Marked tendinosis. Tendon is perched medially at the groove entry without dislocation. Acromioclavicular Joint: Mild-moderate arthropathy of the acromioclavicular joint. No subacromial/subdeltoid bursal fluid. Glenohumeral Joint: Moderate joint effusion extending into the subcoracoid recess with synovitis. No chondral defect. Labrum:  Anteroinferior labrum is markedly heterogeneous. Bones:  No marrow abnormality, fracture or dislocation. Other: None. IMPRESSION: 1. High-grade partial thickness tear of the subscapularis tendon with resultant bicipital instability. 2. Moderate rotator cuff tendinosis with low grade articular surface and interstitial tear of the mid supraspinatus tendon. 3. Marked intra-articular biceps tendinosis. 4. Moderate joint effusion with synovitis. 5. Anteroinferior labral degeneration and tearing. Electronically Signed    By: Davina Poke M.D.   On: 04/18/2019 09:18    Assessment & Plan:   There are no diagnoses linked to this encounter.   No orders of the defined types were placed in this encounter.    Follow-up: No follow-ups on file.  Walker Kehr, MD

## 2019-05-18 NOTE — Assessment & Plan Note (Addendum)
We discussed age appropriate health related issues, including available/recomended screening tests and vaccinations. We discussed a need for adhering to healthy diet and exercise. Labs were ordered to be later reviewed . All questions were answered. 2017 CTA IMPRESSION: 1. Coronary artery calcium score of 0 Agatston units, suggesting low risk for future cardiac events. 2.  No plaque or stenosis noted in the coronary arteries. Shingrix Rx Colonoscopy 2017 DEXA scan

## 2019-05-18 NOTE — Assessment & Plan Note (Signed)
Chronic Rare use of Lorazepam  Potential benefits of a long term benzodiazepines  use as well as potential risks  and complications were explained to the patient and were aknowledged.

## 2019-05-18 NOTE — Assessment & Plan Note (Signed)
2017 CTA IMPRESSION: 1. Coronary artery calcium score of 0 Agatston units, suggesting low risk for future cardiac events. 2.  No plaque or stenosis noted in the coronary arteries. BP Readings from Last 3 Encounters:  05/18/19 130/72  05/11/18 122/68  11/26/17 124/68

## 2019-05-19 LAB — URINALYSIS, ROUTINE W REFLEX MICROSCOPIC
Bilirubin Urine: NEGATIVE
Hgb urine dipstick: NEGATIVE
Ketones, ur: NEGATIVE
Nitrite: NEGATIVE
Specific Gravity, Urine: <=1.005 — AB (ref 1.000–1.030)
Total Protein, Urine: NEGATIVE
Urine Glucose: NEGATIVE
Urobilinogen, UA: 0.2 (ref 0.0–1.0)
pH: 6 (ref 5.0–8.0)

## 2019-06-19 ENCOUNTER — Encounter (HOSPITAL_COMMUNITY): Payer: Self-pay

## 2019-06-19 ENCOUNTER — Ambulatory Visit: Payer: Self-pay

## 2019-06-19 ENCOUNTER — Other Ambulatory Visit: Payer: Self-pay

## 2019-06-19 DIAGNOSIS — I10 Essential (primary) hypertension: Secondary | ICD-10-CM | POA: Insufficient documentation

## 2019-06-19 DIAGNOSIS — Z20828 Contact with and (suspected) exposure to other viral communicable diseases: Secondary | ICD-10-CM | POA: Diagnosis not present

## 2019-06-19 DIAGNOSIS — Z87891 Personal history of nicotine dependence: Secondary | ICD-10-CM | POA: Insufficient documentation

## 2019-06-19 DIAGNOSIS — H6121 Impacted cerumen, right ear: Secondary | ICD-10-CM | POA: Diagnosis not present

## 2019-06-19 DIAGNOSIS — Z79899 Other long term (current) drug therapy: Secondary | ICD-10-CM | POA: Diagnosis not present

## 2019-06-19 DIAGNOSIS — H814 Vertigo of central origin: Secondary | ICD-10-CM | POA: Insufficient documentation

## 2019-06-19 DIAGNOSIS — Z5321 Procedure and treatment not carried out due to patient leaving prior to being seen by health care provider: Secondary | ICD-10-CM | POA: Diagnosis not present

## 2019-06-19 DIAGNOSIS — R42 Dizziness and giddiness: Secondary | ICD-10-CM | POA: Diagnosis present

## 2019-06-19 NOTE — Telephone Encounter (Signed)
noted 

## 2019-06-19 NOTE — ED Triage Notes (Signed)
Pt complains of being dizzy with nausea and vomiting for one day

## 2019-06-19 NOTE — Telephone Encounter (Signed)
Pt states she did not go to ED as directed in earlier triage assessment. States "Feel a little bit better." States she has to hold onto walls to get around. Pt with chills, nausea, vomiting subsided. Advised ED disposition still recommended. Pt concerned about ED visit "Crowded with Covid in the ED." Answered all questions regarding safety, precautions and process. Pt verbalizes understanding, states she will consider going. Husband will drive; made aware he will not be able to accompany her inside ED. Verbalizes understanding.

## 2019-06-19 NOTE — Telephone Encounter (Signed)
Patient called stating that yesterday she started to feel extremely lightheaded. She can not stand without support and she is very nauseated with sitting and standing.  She has vomited.  She has felt chills. Denies fever. Her HR today is 61 and BP 138/65. She states she does have a cold and feels that she has wax buildup in her ear. She states she is trying to drink liquids. She has has symptoms like this in the past but states it has been years. Per protocol patient will go to ER for evaluation. Care advice read to patient she verbalized understanding.  Reason for Disposition . SEVERE dizziness (e.g., unable to stand, requires support to walk, feels like passing out now)  Answer Assessment - Initial Assessment Questions 1. DESCRIPTION: "Describe your dizziness."     lightheaded 2. LIGHTHEADED: "Do you feel lightheaded?" (e.g., somewhat faint, woozy, weak upon standing)     Woozy and sick to stomach 3. VERTIGO: "Do you feel like either you or the room is spinning or tilting?" (i.e. vertigo)    no 4. SEVERITY: "How bad is it?"  "Do you feel like you are going to faint?" "Can you stand and walk?"   - MILD - walking normally   - MODERATE - interferes with normal activities (e.g., work, school)    - SEVERE - unable to stand, requires support to walk, feels like passing out now.      Support to walk 5. ONSET:  "When did the dizziness begin?"     yesterday 6. AGGRAVATING FACTORS: "Does anything make it worse?" (e.g., standing, change in head position)    Stand and sitting make it worse 7. HEART RATE: "Can you tell me your heart rate?" "How many beats in 15 seconds?"  (Note: not all patients can do this)       70's  HR61  BP 138/65 8. CAUSE: "What do you think is causing the dizziness?"     Unsure has sinus and wax in ear 9. RECURRENT SYMPTOM: "Have you had dizziness before?" If so, ask: "When was the last time?" "What happened that time?"    Years ago 10. OTHER SYMPTOMS: "Do you have any other  symptoms?" (e.g., fever, chest pain, vomiting, diarrhea, bleeding)       Chilled, vomiting,  11. PREGNANCY: "Is there any chance you are pregnant?" "When was your last menstrual period?"       N/A  Protocols used: DIZZINESS [facility]

## 2019-06-20 ENCOUNTER — Encounter (HOSPITAL_BASED_OUTPATIENT_CLINIC_OR_DEPARTMENT_OTHER): Payer: Self-pay

## 2019-06-20 ENCOUNTER — Ambulatory Visit (INDEPENDENT_AMBULATORY_CARE_PROVIDER_SITE_OTHER): Payer: Medicare Other | Admitting: Family

## 2019-06-20 ENCOUNTER — Emergency Department (HOSPITAL_BASED_OUTPATIENT_CLINIC_OR_DEPARTMENT_OTHER): Payer: Medicare Other

## 2019-06-20 ENCOUNTER — Emergency Department (HOSPITAL_COMMUNITY)
Admission: EM | Admit: 2019-06-20 | Discharge: 2019-06-20 | Disposition: A | Discharge status: Home / Self Care | Payer: Medicare Other | Source: Home / Self Care | Attending: Emergency Medicine | Admitting: Emergency Medicine

## 2019-06-20 ENCOUNTER — Emergency Department (HOSPITAL_COMMUNITY)
Admission: EM | Admit: 2019-06-20 | Discharge: 2019-06-20 | Discharge status: Against Medical Advice | Payer: Medicare Other | Attending: Emergency Medicine | Admitting: Emergency Medicine

## 2019-06-20 ENCOUNTER — Emergency Department (HOSPITAL_COMMUNITY): Payer: Medicare Other

## 2019-06-20 ENCOUNTER — Other Ambulatory Visit: Payer: Self-pay

## 2019-06-20 DIAGNOSIS — R42 Dizziness and giddiness: Secondary | ICD-10-CM

## 2019-06-20 DIAGNOSIS — Z87891 Personal history of nicotine dependence: Secondary | ICD-10-CM | POA: Insufficient documentation

## 2019-06-20 DIAGNOSIS — H814 Vertigo of central origin: Secondary | ICD-10-CM | POA: Insufficient documentation

## 2019-06-20 DIAGNOSIS — Z20828 Contact with and (suspected) exposure to other viral communicable diseases: Secondary | ICD-10-CM | POA: Insufficient documentation

## 2019-06-20 DIAGNOSIS — H6121 Impacted cerumen, right ear: Secondary | ICD-10-CM | POA: Insufficient documentation

## 2019-06-20 DIAGNOSIS — I10 Essential (primary) hypertension: Secondary | ICD-10-CM | POA: Insufficient documentation

## 2019-06-20 DIAGNOSIS — Z5321 Procedure and treatment not carried out due to patient leaving prior to being seen by health care provider: Secondary | ICD-10-CM | POA: Diagnosis not present

## 2019-06-20 DIAGNOSIS — Z79899 Other long term (current) drug therapy: Secondary | ICD-10-CM | POA: Insufficient documentation

## 2019-06-20 LAB — URINALYSIS, MICROSCOPIC (REFLEX)

## 2019-06-20 LAB — CBC WITH DIFFERENTIAL/PLATELET
Abs Immature Granulocytes: 0.01 10*3/uL (ref 0.00–0.07)
Basophils Absolute: 0 10*3/uL (ref 0.0–0.1)
Basophils Relative: 1 %
Eosinophils Absolute: 0.1 10*3/uL (ref 0.0–0.5)
Eosinophils Relative: 2 %
HCT: 42.7 % (ref 36.0–46.0)
Hemoglobin: 14.2 g/dL (ref 12.0–15.0)
Immature Granulocytes: 0 %
Lymphocytes Relative: 20 %
Lymphs Abs: 1.3 10*3/uL (ref 0.7–4.0)
MCH: 32.3 pg (ref 26.0–34.0)
MCHC: 33.3 g/dL (ref 30.0–36.0)
MCV: 97.3 fL (ref 80.0–100.0)
Monocytes Absolute: 0.5 10*3/uL (ref 0.1–1.0)
Monocytes Relative: 8 %
Neutro Abs: 4.5 10*3/uL (ref 1.7–7.7)
Neutrophils Relative %: 69 %
Platelets: 303 10*3/uL (ref 150–400)
RBC: 4.39 MIL/uL (ref 3.87–5.11)
RDW: 13.5 % (ref 11.5–15.5)
WBC: 6.5 10*3/uL (ref 4.0–10.5)
nRBC: 0 % (ref 0.0–0.2)

## 2019-06-20 LAB — COMPREHENSIVE METABOLIC PANEL
ALT: 29 U/L (ref 0–44)
AST: 32 U/L (ref 15–41)
Albumin: 3.9 g/dL (ref 3.5–5.0)
Alkaline Phosphatase: 59 U/L (ref 38–126)
Anion gap: 7 (ref 5–15)
BUN: 12 mg/dL (ref 8–23)
CO2: 27 mmol/L (ref 22–32)
Calcium: 8.9 mg/dL (ref 8.9–10.3)
Chloride: 103 mmol/L (ref 98–111)
Creatinine, Ser: 0.72 mg/dL (ref 0.44–1.00)
GFR calc Af Amer: >60 mL/min (ref >60)
GFR calc non Af Amer: >60 mL/min (ref >60)
Glucose, Bld: 110 mg/dL — ABNORMAL HIGH (ref 70–99)
Potassium: 4.4 mmol/L (ref 3.5–5.1)
Sodium: 137 mmol/L (ref 135–145)
Total Bilirubin: 0.6 mg/dL (ref 0.3–1.2)
Total Protein: 6.5 g/dL (ref 6.5–8.1)

## 2019-06-20 LAB — URINALYSIS, ROUTINE W REFLEX MICROSCOPIC
Bilirubin Urine: NEGATIVE
Glucose, UA: NEGATIVE mg/dL
Hgb urine dipstick: NEGATIVE
Ketones, ur: NEGATIVE mg/dL
Nitrite: NEGATIVE
Protein, ur: NEGATIVE mg/dL
Specific Gravity, Urine: <1.005 — ABNORMAL LOW (ref 1.005–1.030)
pH: 7 (ref 5.0–8.0)

## 2019-06-20 LAB — SARS CORONAVIRUS 2 AG (30 MIN TAT): SARS Coronavirus 2 Ag: NEGATIVE

## 2019-06-20 MED ORDER — SODIUM CHLORIDE 0.9 % IV BOLUS
1000.0000 mL | Freq: Once | INTRAVENOUS | Status: AC
  Administered 2019-06-20: 1000 mL via INTRAVENOUS

## 2019-06-20 MED ORDER — ONDANSETRON 4 MG PO TBDP: 4.0000 mg | ORAL_TABLET | ORAL | 0 refills | Status: DC | PRN

## 2019-06-20 MED ORDER — MECLIZINE HCL 25 MG PO TABS
25.0000 mg | ORAL_TABLET | Freq: Once | ORAL | Status: AC
  Administered 2019-06-20: 25 mg via ORAL
  Filled 2019-06-20: qty 1

## 2019-06-20 MED ORDER — MECLIZINE HCL 25 MG PO TABS
25.0000 mg | ORAL_TABLET | Freq: Once | ORAL | Status: AC
  Administered 2019-06-20: 22:00:00 25 mg via ORAL
  Filled 2019-06-20: qty 1

## 2019-06-20 MED ORDER — MECLIZINE HCL 25 MG PO TABS: 25.0000 mg | ORAL_TABLET | Freq: Three times a day (TID) | ORAL | 0 refills | Status: DC | PRN

## 2019-06-20 MED ORDER — DIAZEPAM 5 MG PO TABS: 2.5000 mg | ORAL_TABLET | Freq: Four times a day (QID) | ORAL | 0 refills | Status: DC | PRN

## 2019-06-20 MED ORDER — DIAZEPAM 2 MG PO TABS
2.0000 mg | ORAL_TABLET | Freq: Once | ORAL | Status: AC
  Administered 2019-06-20: 2 mg via ORAL
  Filled 2019-06-20: qty 1

## 2019-06-20 NOTE — ED Notes (Signed)
Walked patient to bathroom to get dressed. Patient still complaining of feeling "swimmy-headed" and dizzy.

## 2019-06-20 NOTE — ED Triage Notes (Addendum)
Pt c/o dizziness x 2 days-denies pain-denies fever/flu like sx-pt [facility] ED yesterday-pt NAD-to triage in w/c-pt later added feeling hot and cold and runny nose

## 2019-06-20 NOTE — ED Notes (Signed)
Patient transported to MRI 

## 2019-06-20 NOTE — ED Notes (Signed)
Discharge instructions and prescriptions discussed with Pt. Pt verbalized understanding. Pt stable and ambulatory but leaving via WC with husband due to dizziness.

## 2019-06-20 NOTE — ED Notes (Signed)
ED Provider at bedside. 

## 2019-06-20 NOTE — Discharge Instructions (Addendum)
1.  Schedule a follow-up appointment with the ear nose throat doctor. 2.  You may take meclizine every 8 hours for dizziness.  You may also take Valium as prescribed if symptoms not controlled by meclizine alone. 3.  Be very careful not to fall.  Make sure someone is always at your side when you are going to the bathroom or walking. 4.  Return to the emergency department if your symptoms are not improved, they are worsening or you have new concerning symptoms.

## 2019-06-20 NOTE — ED Provider Notes (Signed)
Patient is sent to [facility] for MRI.  She developed vertigo with ataxic gait. Physical Exam  BP (!) 134/57 (BP Location: Left Arm)   Pulse 65   Temp 97.9 F (36.6 C) (Oral)   Resp 16   Ht 5\' 2"  (1.575 m)   Wt 69.4 kg   SpO2 100%   BMI 27.98 kg/m   Physical Exam Patient is alert and appropriate.  No confusion.  No respiratory distress.  With ambulation, patient does remain slightly ataxic but can ambulate appropriately with assistant. ED Course/Procedures     Procedures  MDM  MRI is negative.  Symptoms are consistent with peripheral vertigo.  He is nontoxic and otherwise well in appearance.  Will prescribe for meclizine every 8 hours and also Valium 2.5 mg for additional symptom control.  Follow-up with ENT as discussed and return precautions reviewed.       Charlesetta Shanks, MD 06/20/19 2152

## 2019-06-20 NOTE — Progress Notes (Signed)
Haley Armstrong is a 73 y.o. female with the following history as recorded in EpicCare:  Patient Active Problem List   Diagnosis Date Noted  . Rotator cuff tear arthropathy, right 05/18/2019  . Family history of coronary artery disease 06/04/2016  . Cellulitis of foot 02/07/2016  . Chest pain 05/16/2015  . Well adult exam 03/09/2013  . Edema 03/09/2013  . Pain in left foot 03/08/2013  . ADD (attention deficit disorder) 06/15/2012  . Compulsive behavior disorder ([place]) 06/15/2012  . Acute left lumbar radiculopathy 10/18/2011  . Iron deficiency anemia 10/21/2010  . History of esophageal spasm 10/21/2010  . FATIGUE, ACUTE 07/02/2009  . TOBACCO USE, QUIT 07/02/2009  . NONSPEC ABN FINDNG RAD & OTH EXAM ABDOMINAL AREA 04/11/2009  . Diarrhea 04/09/2009  . ABDOMINAL PAIN-EPIGASTRIC 04/09/2009  . Anxiety state 02/24/2009  . Rash and other nonspecific skin eruption 09/30/2007  . Memory loss 09/21/2007  . Goiter, unspecified 09/09/2007  . Essential hypertension 05/24/2007  . PARESTHESIA 05/24/2007  . Cervicalgia 05/20/2007  . Other abnormal glucose 05/20/2007  . GERD 04/06/2007  . Irritable bowel syndrome 04/06/2007  . OSTEOARTHRITIS 04/06/2007  . LOW BACK PAIN 04/06/2007  . TROCHANTERIC BURSITIS 04/06/2007    Current Outpatient Medications  Medication Sig Dispense Refill  . Alum & Mag Hydroxide-Simeth (GI COCKTAIL) SUSP suspension 22ml of 2% viscous lidocaine  43ml of Dicyclomine (10mg /42ml)  248ml of Maalox (400mg )  Take 5-60ml every 4-6 hours for esophageal spasms 1200 mL 1  . amLODipine (NORVASC) 2.5 MG tablet Take 1 tablet (2.5 mg total) by mouth daily. 90 tablet 3  . b complex vitamins tablet Take 1 tablet by mouth daily.      . Cholecalciferol 1000 UNITS tablet Take 1,000 Units by mouth daily.      . Diclofenac Sodium (PENNSAID) 1.5 % SOLN APPLY 3 TO 5 DROPS ON SKIN THREE TIMES A DAY FOR PAIN 150 mL 2  . diclofenac sodium (VOLTAREN) 1 % GEL Apply 4 g topically 4 (four)  times daily. 100 Tube 11  . enalapril (VASOTEC) 5 MG tablet TAKE ONE (1) TABLET BY MOUTH TWO (2) TIMES DAILY 180 tablet 3  . hyoscyamine (LEVBID) 0.375 MG 12 hr tablet TAKE ONE (1) TABLET BY MOUTH TWO (2) TIMES DAILY 180 tablet 3  . hyoscyamine (LEVSIN SL) 0.125 MG SL tablet DISSOLVE 1-2 TABLETS UNDER THE TONGUE EVERY FOUR HOURS AS NEEDED FOR PAIN 100 tablet 2  . levothyroxine (SYNTHROID) 25 MCG tablet 1 po qam as directed 90 tablet 3  . LORazepam (ATIVAN) 0.5 MG tablet Take 1 by mouth every night at bedtime as needed for sleep 30 tablet 3  . Lutein-Zeaxanthin 25-5 MG CAPS Take by mouth. Takes one daily    . meloxicam (MOBIC) 15 MG tablet 1 po qd pc prn 90 tablet 3  . methocarbamol (ROBAXIN) 500 MG tablet TAKE ONE TABLET BY MOUTH FOUR TIMES DAILY AS NEEDED 120 tablet 0  . Multiple Vitamins-Minerals (MULTIVITAMIN,TX-MINERALS) tablet Take 1 tablet by mouth daily.      . Omega 3 1000 MG CAPS Take 2 capsules by mouth daily.    . pantoprazole (PROTONIX) 40 MG tablet Take 1 tablet (40 mg total) by mouth daily. 90 tablet 3  . polyethylene glycol powder (GLYCOLAX/MIRALAX) powder Take 255 g by mouth daily. Mix 17g of Miralax in 8oz of water daily 255 g 3  . pregabalin (LYRICA) 150 MG capsule Take 1 capsule (150 mg total) by mouth 2 (two) times daily. 180 capsule 1  .  Sennosides (SENNA LAX PO) Take by mouth. As needed      No current facility-administered medications for this visit.    Allergies: Patient has no known allergies.  Past Medical History:  Diagnosis Date  . Anemia, iron deficiency   . Anxiety   . Blood donor   . Family history of coronary artery disease 06/04/2016  . GERD (gastroesophageal reflux disease)   . Hypertension   . IBS (irritable bowel syndrome)   . Osteoarthritis     Past Surgical History:  Procedure Laterality Date  . APPENDECTOMY    . CERVICAL FUSION  2011   [facility]-5 Dr Marcial Pacas  . CHOLECYSTECTOMY    . ESOPHAGEAL MANOMETRY N/A 11/13/2013   Procedure: ESOPHAGEAL MANOMETRY  (EM);  Surgeon: Irene Shipper, MD;  Location: [facility];  Service: Endoscopy;  Laterality: N/A;    Family History  Problem Relation Age of Onset  . Lymphoma Mother   . Dementia Mother   . Uterine cancer Mother 67       uterine sarcoma  . Hypothyroidism Mother   . Parkinsonism Father   . Heart disease Brother        CAD  . Heart attack Brother   . Heart disease Maternal Aunt   . Heart attack Maternal Aunt   . Colon cancer Neg Hx   . Esophageal cancer Neg Hx   . Rectal cancer Neg Hx   . Stomach cancer Neg Hx     Social History   Tobacco Use  . Smoking status: Former Research scientist (life sciences)  . Smokeless tobacco: Never Used  Substance Use Topics  . Alcohol use: Yes    Alcohol/week: 3.0 standard drinks    Types: 3 Glasses of wine per week    Subjective:    I connected with Haley Armstrong on 06/20/19 at 12:20 PM EST by telephone call and verified that I am speaking with the correct person using two identifiers. Provider in office/ patient is at home; provider and patient are only 2 people on video call.     I discussed the limitations of evaluation and management by telemedicine and the availability of in person appointments. The patient expressed understanding and agreed to proceed.  2 day history of severe dizziness, nausea; notes she is having to hold onto walls to walk; has been taking Meclizine since Sunday evening with no benefit; has been drinking ginger ale but nausea is problematic; has no prior history of vertigo; no prior history of A. Fib; no vision changes; went to ER yesterday but did not feel comfortable waiting for 6+ hours; wondering if she could go to ER at [facility] today;     Objective:  There were no vitals filed for this visit.  Lungs: Respirations unlabored; Neurologic: Alert and oriented; speech intact;    1. Dizziness     Plan:  With limited response to Meclizine, agree that ER is appropriate; She will have her husband take her to ER. Follow-up to be  determined.   Time spent 12 minutes  No follow-ups on file.  No orders of the defined types were placed in this encounter.   Requested Prescriptions    No prescriptions requested or ordered in this encounter

## 2019-06-20 NOTE — ED Provider Notes (Signed)
[facility] EMERGENCY DEPARTMENT Provider Note   CSN: [id number] Arrival date & time: 06/20/19  1312     History Chief Complaint  Patient presents with  . Dizziness    Haley Armstrong is a 73 y.o. female.  HPI      Sunday developed swimmy headedness, dizziness Started out feeling off then got worse Off balance, husband had to help her to bathroom  Meclizine yesterday was a little better last night (no hx of dizziness, had it for flying) Did not feel better after taking it (took 3)-well then reports did feel a little better last night, took it before going to bed but it continued this morning  Act of laying down and bending over makes it worse.  Sitting still helps.  Not worse with head movements.  Off balance, swimmy headed, nauseas, difficulty walking No syncope or lightheaded   No numbness/weakness, ligament right shoulder problems,  No facial droop, trouble talking, change in vision  Co-worker had COVID, was out for a week then back yesterday, had potential customer exposure Friday  Htn No cholesterol, no DM, no hx of rhythm problems , GFR was worsened a little on last check After neck surgery reports they thought she maybe had a stroke but did not have diagnosis or work up.   Past Medical History:  Diagnosis Date  . Anemia, iron deficiency   . Anxiety   . Blood donor   . Family history of coronary artery disease 06/04/2016  . GERD (gastroesophageal reflux disease)   . Hypertension   . IBS (irritable bowel syndrome)   . Osteoarthritis     Patient Active Problem List   Diagnosis Date Noted  . Rotator cuff tear arthropathy, right 05/18/2019  . Family history of coronary artery disease 06/04/2016  . Cellulitis of foot 02/07/2016  . Chest pain 05/16/2015  . Well adult exam 03/09/2013  . Edema 03/09/2013  . Pain in left foot 03/08/2013  . ADD (attention deficit disorder) 06/15/2012  . Compulsive behavior disorder ([place]) 06/15/2012  . Acute left  lumbar radiculopathy 10/18/2011  . Iron deficiency anemia 10/21/2010  . History of esophageal spasm 10/21/2010  . FATIGUE, ACUTE 07/02/2009  . TOBACCO USE, QUIT 07/02/2009  . NONSPEC ABN FINDNG RAD & OTH EXAM ABDOMINAL AREA 04/11/2009  . Diarrhea 04/09/2009  . ABDOMINAL PAIN-EPIGASTRIC 04/09/2009  . Anxiety state 02/24/2009  . Rash and other nonspecific skin eruption 09/30/2007  . Memory loss 09/21/2007  . Goiter, unspecified 09/09/2007  . Essential hypertension 05/24/2007  . PARESTHESIA 05/24/2007  . Cervicalgia 05/20/2007  . Other abnormal glucose 05/20/2007  . GERD 04/06/2007  . Irritable bowel syndrome 04/06/2007  . OSTEOARTHRITIS 04/06/2007  . LOW BACK PAIN 04/06/2007  . TROCHANTERIC BURSITIS 04/06/2007    Past Surgical History:  Procedure Laterality Date  . APPENDECTOMY    . CERVICAL FUSION  2011   C4-5 Dr Marcial Pacas  . CHOLECYSTECTOMY    . ESOPHAGEAL MANOMETRY N/A 11/13/2013   Procedure: ESOPHAGEAL MANOMETRY (EM);  Surgeon: Irene Shipper, MD;  Location: [facility];  Service: Endoscopy;  Laterality: N/A;     OB History   No obstetric history on file.     Family History  Problem Relation Age of Onset  . Lymphoma Mother   . Dementia Mother   . Uterine cancer Mother 34       uterine sarcoma  . Hypothyroidism Mother   . Parkinsonism Father   . Heart disease Brother        CAD  .  Heart attack Brother   . Heart disease Maternal Aunt   . Heart attack Maternal Aunt   . Colon cancer Neg Hx   . Esophageal cancer Neg Hx   . Rectal cancer Neg Hx   . Stomach cancer Neg Hx     Social History   Tobacco Use  . Smoking status: Former Research scientist (life sciences)  . Smokeless tobacco: Never Used  Substance Use Topics  . Alcohol use: Yes    Comment: occ  . Drug use: No    Home Medications Prior to Admission medications   Medication Sig Start Date End Date Taking? Authorizing Provider  Alum & Mag Hydroxide-Simeth (GI COCKTAIL) SUSP suspension 41ml of 2% viscous lidocaine  4ml of  Dicyclomine (10mg /22ml)  260ml of Maalox (400mg )  Take 5-38ml every 4-6 hours for esophageal spasms 11/19/16   Levin Erp, PA  amLODipine (NORVASC) 2.5 MG tablet Take 1 tablet (2.5 mg total) by mouth daily. 05/18/19   [name], Evie Lacks, MD  b complex vitamins tablet Take 1 tablet by mouth daily.      [provider]  Cholecalciferol 1000 UNITS tablet Take 1,000 Units by mouth daily.      [provider]  Diclofenac Sodium (PENNSAID) 1.5 % SOLN APPLY 3 TO 5 DROPS ON SKIN THREE TIMES A DAY FOR PAIN 03/08/13   [name], Evie Lacks, MD  diclofenac sodium (VOLTAREN) 1 % GEL Apply 4 g topically 4 (four) times daily. 01/08/16   Wallene Huh, DPM  enalapril (VASOTEC) 5 MG tablet TAKE ONE (1) TABLET BY MOUTH TWO (2) TIMES DAILY 05/18/19   [name], Evie Lacks, MD  hyoscyamine (LEVBID) 0.375 MG 12 hr tablet TAKE ONE (1) TABLET BY MOUTH TWO (2) TIMES DAILY 05/18/19   [name], Evie Lacks, MD  hyoscyamine (LEVSIN SL) 0.125 MG SL tablet DISSOLVE 1-2 TABLETS UNDER THE TONGUE EVERY FOUR HOURS AS NEEDED FOR PAIN 04/26/17   [name], Evie Lacks, MD  levothyroxine (SYNTHROID) 25 MCG tablet 1 po qam as directed 05/18/19   [name], Evie Lacks, MD  LORazepam (ATIVAN) 0.5 MG tablet Take 1 by mouth every night at bedtime as needed for sleep 05/18/19   [name], Evie Lacks, MD  Lutein-Zeaxanthin 25-5 MG CAPS Take by mouth. Takes one daily    [provider]  meloxicam (MOBIC) 15 MG tablet 1 po qd pc prn 05/18/19   [name], Evie Lacks, MD  methocarbamol (ROBAXIN) 500 MG tablet TAKE ONE TABLET BY MOUTH FOUR TIMES DAILY AS NEEDED 05/18/19   [name], Evie Lacks, MD  Multiple Vitamins-Minerals (MULTIVITAMIN,TX-MINERALS) tablet Take 1 tablet by mouth daily.      [provider]  Omega 3 1000 MG CAPS Take 2 capsules by mouth daily.    [provider]  pantoprazole (PROTONIX) 40 MG tablet Take 1 tablet (40 mg total) by mouth daily. 05/18/19   [name], Evie Lacks,  MD  polyethylene glycol powder (GLYCOLAX/MIRALAX) powder Take 255 g by mouth daily. Mix 17g of Miralax in 8oz of water daily 11/19/16   Levin Erp, PA  pregabalin (LYRICA) 150 MG capsule Take 1 capsule (150 mg total) by mouth 2 (two) times daily. 05/18/19   [name], Evie Lacks, MD  Sennosides (SENNA LAX PO) Take by mouth. As needed     [provider]    Allergies    Patient has no known allergies.  Review of Systems   Review of Systems  Constitutional: Positive for appetite change, chills and fatigue. Negative for fever.  HENT: Positive for  congestion and rhinorrhea. Negative for sore throat.   Eyes: Negative for visual disturbance.  Respiratory: Negative for cough and shortness of breath.   Cardiovascular: Negative for chest pain.  Gastrointestinal: Positive for nausea and vomiting (once two nights ago). Negative for abdominal pain (IBS), blood in stool, constipation and diarrhea.  Genitourinary: Negative for difficulty urinating and dysuria.  Musculoskeletal: Negative for back pain and neck pain.  Skin: Negative for rash.  Neurological: Positive for dizziness. Negative for syncope, speech difficulty, weakness, light-headedness, numbness and headaches.    Physical Exam Updated Vital Signs BP (!) 156/71   Pulse (!) 59   Temp 97.7 F (36.5 C) (Oral)   Resp 13   Ht 5\' 2"  (1.575 m)   Wt 69.4 kg   SpO2 100%   BMI 27.98 kg/m   Physical Exam Vitals and nursing note reviewed.  Constitutional:      General: She is not in acute distress.    Appearance: She is well-developed. She is not diaphoretic.  HENT:     Head: Normocephalic and atraumatic.     Right Ear: There is impacted cerumen (sheets of cerumen unable to visualize TM).  Eyes:     General: No visual field deficit.    Conjunctiva/sclera: Conjunctivae normal.  Cardiovascular:     Rate and Rhythm: Normal rate and regular rhythm.     Heart sounds: Normal heart sounds. No murmur. No friction rub. No  gallop.   Pulmonary:     Effort: Pulmonary effort is normal. No respiratory distress.     Breath sounds: Normal breath sounds. No wheezing or rales.  Abdominal:     General: There is no distension.     Palpations: Abdomen is soft.     Tenderness: There is no abdominal tenderness. There is no guarding.  Musculoskeletal:        General: No tenderness.     Cervical back: Normal range of motion.  Skin:    General: Skin is warm and dry.     Findings: No erythema or rash.  Neurological:     Mental Status: She is alert and oriented to person, place, and time.     GCS: GCS eye subscore is 4. GCS verbal subscore is 5. GCS motor subscore is 6.     Cranial Nerves: Cranial nerves are intact. No cranial nerve deficit, dysarthria or facial asymmetry.     Sensory: Sensation is intact. No sensory deficit.     Motor: Motor function is intact. No weakness or tremor.     Coordination: Coordination normal. Heel to Shin Test normal.     Gait: Abnormal gait: slow and wide based, still symptomatic with dizziness limiting exam.     ED Results / Procedures / Treatments   Labs (all labs ordered are listed, but only abnormal results are displayed) Labs Reviewed  COMPREHENSIVE METABOLIC PANEL - Abnormal; Notable for the following components:      Result Value   Glucose, Bld 110 (*)    All other components within normal limits  URINALYSIS, ROUTINE W REFLEX MICROSCOPIC - Abnormal; Notable for the following components:   Specific Gravity, Urine <1.005 (*)    Leukocytes,Ua TRACE (*)    All other components within normal limits  URINALYSIS, MICROSCOPIC (REFLEX) - Abnormal; Notable for the following components:   Bacteria, UA FEW (*)    All other components within normal limits  SARS CORONAVIRUS 2 AG (30 MIN TAT)  URINE CULTURE  CBC WITH DIFFERENTIAL/PLATELET    EKG EKG Interpretation  Date/Time:  Tuesday June 20 2019 14:26:31 EST Ventricular Rate:  61 PR Interval:    QRS Duration: 81 QT  Interval:  403 QTC Calculation: 406 R Axis:   83 Text Interpretation: Sinus rhythm Borderline right axis deviation No significant change since last tracing Confirmed by Gareth Morgan [phone]) on 06/20/2019 4:06:01 PM   Radiology No results found.  Procedures Procedures (including critical care time)  Medications Ordered in ED Medications  sodium chloride 0.9 % bolus 1,000 mL (1,000 mLs Intravenous New Bag/Given 06/20/19 1446)  meclizine (ANTIVERT) tablet 25 mg (25 mg Oral Given 06/20/19 1428)    ED Course  I have reviewed the triage vital signs and the nursing notes.  Pertinent labs & imaging results that were available during my care of the patient were reviewed by me and considered in my medical decision making (see chart for details).    MDM Rules/Calculators/A&P                       73yo female with history of hypertension presents with concern for dizziness and nausea since Sunday. Differential diagnosis for dizziness includes central causes such as stroke, intracranial bleed, mass and peripheral causes such as BPPV, meniere's disease, viral.   Is noted to have cerumen impaction, attempted removal with curette without success, will irrigate.   Labs without anemia or significant electrolyte abnormalities. No signs of UTI. No signs of cardiac arrhythmia.   While symptoms by history have historical components consistent with peripheral vertigo, she has had constant dizziness for days, has risk factors for CVA and has persistent symptoms here after meclizine and will order additional CT and MRI brain to evaluate for central etiology.  CT ordered and pending at time of transfer of care to Dr. Alvino Chapel. Dr. Sherry Ruffing is accepting [facility], if CT shows any significant emergent abnormality, Dr. Alvino Chapel will update regarding plan of care.    Final Clinical Impression(s) / ED Diagnoses Final diagnoses:  Dizziness  Vertigo    Rx / DC Orders ED Discharge Orders      None       Gareth Morgan, MD 06/20/19 1606

## 2019-06-20 NOTE — ED Notes (Signed)
Report to [facility] staff at bedside, pt is aware of plan to transfer to [facility] for mri, she has updated her family.

## 2019-06-20 NOTE — ED Triage Notes (Signed)
Pt called from triage with no answer and pt not seen in lobby

## 2019-06-20 NOTE — ED Notes (Signed)
Called [facility] spoke with doug for transfer ed to ed  For mri

## 2019-06-21 ENCOUNTER — Telehealth (HOSPITAL_BASED_OUTPATIENT_CLINIC_OR_DEPARTMENT_OTHER): Payer: Self-pay | Admitting: Emergency Medicine

## 2019-06-21 LAB — URINE CULTURE: Culture: NO GROWTH

## 2019-06-27 DIAGNOSIS — H8120 Vestibular neuronitis, unspecified ear: Secondary | ICD-10-CM | POA: Diagnosis not present

## 2019-06-27 DIAGNOSIS — R42 Dizziness and giddiness: Secondary | ICD-10-CM | POA: Diagnosis not present

## 2019-07-26 ENCOUNTER — Encounter: Payer: Self-pay | Admitting: Internal Medicine

## 2019-07-26 ENCOUNTER — Ambulatory Visit: Payer: Medicare Other | Admitting: Internal Medicine

## 2019-07-26 ENCOUNTER — Other Ambulatory Visit: Payer: Self-pay

## 2019-07-26 DIAGNOSIS — R21 Rash and other nonspecific skin eruption: Secondary | ICD-10-CM | POA: Diagnosis not present

## 2019-07-26 DIAGNOSIS — M545 Low back pain, unspecified: Secondary | ICD-10-CM

## 2019-07-26 DIAGNOSIS — M79605 Pain in left leg: Secondary | ICD-10-CM

## 2019-07-26 LAB — BASIC METABOLIC PANEL
BUN: 15 mg/dL (ref 6–23)
CO2: 27 mEq/L (ref 19–32)
Calcium: 9.4 mg/dL (ref 8.4–10.5)
Chloride: 101 mEq/L (ref 96–112)
Creatinine, Ser: 0.92 mg/dL (ref 0.40–1.20)
GFR: 59.89 mL/min — ABNORMAL LOW (ref >60.00)
Glucose, Bld: 87 mg/dL (ref 70–99)
Potassium: 4.1 mEq/L (ref 3.5–5.1)
Sodium: 135 mEq/L (ref 135–145)

## 2019-07-26 LAB — CBC WITH DIFFERENTIAL/PLATELET
Basophils Absolute: 0.1 10*3/uL (ref 0.0–0.1)
Basophils Relative: 1 % (ref 0.0–3.0)
Eosinophils Absolute: 0.2 10*3/uL (ref 0.0–0.7)
Eosinophils Relative: 2.6 % (ref 0.0–5.0)
HCT: 36.9 % (ref 36.0–46.0)
Hemoglobin: 12.5 g/dL (ref 12.0–15.0)
Lymphocytes Relative: 34.5 % (ref 12.0–46.0)
Lymphs Abs: 2.2 10*3/uL (ref 0.7–4.0)
MCHC: 33.7 g/dL (ref 30.0–36.0)
MCV: 95.5 fl (ref 78.0–100.0)
Monocytes Absolute: 0.6 10*3/uL (ref 0.1–1.0)
Monocytes Relative: 9.9 % (ref 3.0–12.0)
Neutro Abs: 3.4 10*3/uL (ref 1.4–7.7)
Neutrophils Relative %: 52 % (ref 43.0–77.0)
Platelets: 264 10*3/uL (ref 150.0–400.0)
RBC: 3.87 Mil/uL (ref 3.87–5.11)
RDW: 13.4 % (ref 11.5–15.5)
WBC: 6.5 10*3/uL (ref 4.0–10.5)

## 2019-07-26 LAB — URINALYSIS, ROUTINE W REFLEX MICROSCOPIC
Bilirubin Urine: NEGATIVE
Hgb urine dipstick: NEGATIVE
Nitrite: NEGATIVE
RBC / HPF: NONE SEEN (ref 0–?)
Specific Gravity, Urine: <=1.005 — AB (ref 1.000–1.030)
Total Protein, Urine: NEGATIVE
Urine Glucose: NEGATIVE
Urobilinogen, UA: 0.2 (ref 0.0–1.0)
pH: 6 (ref 5.0–8.0)

## 2019-07-26 LAB — C-REACTIVE PROTEIN: CRP: 2.5 mg/dL (ref 0.5–20.0)

## 2019-07-26 LAB — SEDIMENTATION RATE: Sed Rate: 16 mm/hr (ref 0–30)

## 2019-07-26 MED ORDER — METHYLPREDNISOLONE 4 MG PO TBPK: ORAL_TABLET | ORAL | 0 refills | Status: DC

## 2019-07-26 MED ORDER — TRAMADOL HCL 50 MG PO TABS: 50.0000 mg | ORAL_TABLET | Freq: Four times a day (QID) | ORAL | 0 refills | Status: AC | PRN

## 2019-07-26 NOTE — Progress Notes (Signed)
Subjective:  Patient ID: Haley Armstrong, female    DOB: [birth year]  Age: 74 y.o. MRN: [id number]  CC: Leg Swelling (Bilateral leg swelling, red and warm to touch)   HPI Haley Armstrong presents for leg rash on B legs x weeks - worse lately C/o LBP C/o LLE pain after a fall - worse  Outpatient Medications Prior to Visit  Medication Sig Dispense Refill  . Alum & Mag Hydroxide-Simeth (GI COCKTAIL) SUSP suspension 75ml of 2% viscous lidocaine  1ml of Dicyclomine (10mg /83ml)  254ml of Maalox (400mg )  Take 5-53ml every 4-6 hours for esophageal spasms 1200 mL 1  . amLODipine (NORVASC) 2.5 MG tablet Take 1 tablet (2.5 mg total) by mouth daily. 90 tablet 3  . b complex vitamins tablet Take 1 tablet by mouth daily.      . B Complex-Biotin-FA (SUPER QUINTS B-50) TABS Take by mouth.    . Cholecalciferol 1000 UNITS tablet Take 1,000 Units by mouth daily.      . diazepam (VALIUM) 5 MG tablet Take 0.5 tablets (2.5 mg total) by mouth every 6 (six) hours as needed (VERTIGO). 15 tablet 0  . Diclofenac Sodium (PENNSAID) 1.5 % SOLN APPLY 3 TO 5 DROPS ON SKIN THREE TIMES A DAY FOR PAIN 150 mL 2  . diclofenac sodium (VOLTAREN) 1 % GEL Apply 4 g topically 4 (four) times daily. 100 Tube 11  . enalapril (VASOTEC) 5 MG tablet TAKE ONE (1) TABLET BY MOUTH TWO (2) TIMES DAILY 180 tablet 3  . hyoscyamine (LEVBID) 0.375 MG 12 hr tablet TAKE ONE (1) TABLET BY MOUTH TWO (2) TIMES DAILY 180 tablet 3  . hyoscyamine (LEVSIN SL) 0.125 MG SL tablet DISSOLVE 1-2 TABLETS UNDER THE TONGUE EVERY FOUR HOURS AS NEEDED FOR PAIN 100 tablet 2  . levothyroxine (SYNTHROID) 25 MCG tablet 1 po qam as directed 90 tablet 3  . LORazepam (ATIVAN) 0.5 MG tablet Take 1 by mouth every night at bedtime as needed for sleep 30 tablet 3  . Lutein-Zeaxanthin 25-5 MG CAPS Take by mouth. Takes one daily    . meclizine (ANTIVERT) 25 MG tablet Take 1 tablet (25 mg total) by mouth 3 (three) times daily as needed for dizziness. 30 tablet 0  .  meloxicam (MOBIC) 15 MG tablet 1 po qd pc prn 90 tablet 3  . methocarbamol (ROBAXIN) 500 MG tablet TAKE ONE TABLET BY MOUTH FOUR TIMES DAILY AS NEEDED 120 tablet 0  . Multiple Vitamins-Minerals (MULTIVITAMIN,TX-MINERALS) tablet Take 1 tablet by mouth daily.      . Omega 3 1000 MG CAPS Take 2 capsules by mouth daily.    . ondansetron (ZOFRAN ODT) 4 MG disintegrating tablet Take 1 tablet (4 mg total) by mouth every 4 (four) hours as needed for nausea or vomiting. 20 tablet 0  . pantoprazole (PROTONIX) 40 MG tablet Take 1 tablet (40 mg total) by mouth daily. 90 tablet 3  . polyethylene glycol powder (GLYCOLAX/MIRALAX) powder Take 255 g by mouth daily. Mix 17g of Miralax in 8oz of water daily 255 g 3  . pregabalin (LYRICA) 150 MG capsule Take 1 capsule (150 mg total) by mouth 2 (two) times daily. 180 capsule 1  . Sennosides (SENNA LAX PO) Take by mouth. As needed     . triamcinolone cream (KENALOG) 0.1 % APPLY TO AFFECTED AREA UP TO TWICE DAILY AS NEEDED, AVOID FACE/GROIN/UNDERARMS     No facility-administered medications prior to visit.    ROS: Review of Systems  Constitutional: Positive  for fatigue. Negative for activity change, appetite change, chills and unexpected weight change.  HENT: Negative for congestion, mouth sores and sinus pressure.   Eyes: Negative for visual disturbance.  Respiratory: Negative for cough and chest tightness.   Gastrointestinal: Negative for abdominal pain and nausea.  Genitourinary: Negative for difficulty urinating, frequency and vaginal pain.  Musculoskeletal: Positive for arthralgias, back pain, gait problem, myalgias and neck stiffness.  Skin: Negative for pallor and rash.  Neurological: Negative for dizziness, tremors, weakness, numbness and headaches.  Psychiatric/Behavioral: Negative for confusion and sleep disturbance. The patient is nervous/anxious.     Objective:  BP 130/70 (BP Location: Left Arm, Patient Position: Sitting, Cuff Size: Large)   Pulse  67   Temp 98 F (36.7 C) (Oral)   Ht 5\' 2"  (1.575 m)   Wt 156 lb 6.4 oz (70.9 kg)   SpO2 98%   BMI 28.61 kg/m   BP Readings from Last 3 Encounters:  07/26/19 130/70  06/20/19 (!) 134/57  06/19/19 (!) 169/76    Wt Readings from Last 3 Encounters:  07/26/19 156 lb 6.4 oz (70.9 kg)  06/20/19 153 lb (69.4 kg)  06/19/19 153 lb 8 oz (69.6 kg)    Physical Exam Constitutional:      General: She is not in acute distress.    Appearance: She is well-developed.  HENT:     Head: Normocephalic.     Right Ear: External ear normal.     Left Ear: External ear normal.     Nose: Nose normal.  Eyes:     General:        Right eye: No discharge.        Left eye: No discharge.     Conjunctiva/sclera: Conjunctivae normal.     Pupils: Pupils are equal, round, and reactive to light.  Neck:     Thyroid: No thyromegaly.     Vascular: No JVD.     Trachea: No tracheal deviation.  Cardiovascular:     Rate and Rhythm: Normal rate and regular rhythm.     Heart sounds: Normal heart sounds.  Pulmonary:     Effort: No respiratory distress.     Breath sounds: No stridor. No wheezing.  Abdominal:     General: Bowel sounds are normal. There is no distension.     Palpations: Abdomen is soft. There is no mass.     Tenderness: There is no abdominal tenderness. There is no guarding or rebound.  Musculoskeletal:        General: Tenderness present.     Cervical back: Normal range of motion and neck supple.     Right lower leg: Edema present.     Left lower leg: Edema present.  Lymphadenopathy:     Cervical: No cervical adenopathy.  Skin:    Findings: Rash present. No erythema.  Neurological:     Mental Status: She is oriented to person, place, and time.     Cranial Nerves: No cranial nerve deficit.     Motor: No abnormal muscle tone.     Coordination: Coordination normal.     Deep Tendon Reflexes: Reflexes normal.  Psychiatric:        Behavior: Behavior normal.        Thought Content: Thought  content normal.        Judgment: Judgment normal.   trace edema B Petechial rash B LE       Lab Results  Component Value Date   WBC 6.5 06/20/2019   HGB  14.2 06/20/2019   HCT 42.7 06/20/2019   PLT 303 06/20/2019   GLUCOSE 110 (H) 06/20/2019   CHOL 163 05/18/2019   TRIG 63.0 05/18/2019   HDL 62.50 05/18/2019   LDLCALC 88 05/18/2019   ALT 29 06/20/2019   AST 32 06/20/2019   NA 137 06/20/2019   K 4.4 06/20/2019   CL 103 06/20/2019   CREATININE 0.72 06/20/2019   BUN 12 06/20/2019   CO2 27 06/20/2019   TSH 3.26 05/18/2019   INR 0.90 08/05/2009   HGBA1C 5.8 05/08/2016    CT Head Wo Contrast  Result Date: 06/20/2019 CLINICAL DATA:  74 year old female with vertigo and dizziness. EXAM: CT HEAD WITHOUT CONTRAST TECHNIQUE: Contiguous axial images were obtained from the base of the skull through the vertex without intravenous contrast. COMPARISON:  Brain MRI dated 07/26/2009. FINDINGS: Brain: The ventricles and sulci appropriate size for patient's age. Minimal periventricular and deep white matter chronic microvascular ischemic changes noted. There is no acute intracranial hemorrhage. No mass effect or midline shift. No extra-axial fluid collection. Vascular: No hyperdense vessel or unexpected calcification. Skull: Normal. Negative for fracture or focal lesion. Sinuses/Orbits: No acute finding. Other: None IMPRESSION: No acute intracranial pathology. Electronically Signed   By: Anner Crete M.D.   On: 06/20/2019 16:35   MR BRAIN WO CONTRAST  Result Date: 06/20/2019 CLINICAL DATA:  Dizziness with nausea and vomiting EXAM: MRI HEAD WITHOUT CONTRAST TECHNIQUE: Multiplanar, multiecho pulse sequences of the brain and surrounding structures were obtained without intravenous contrast. COMPARISON:  Head CT 06/20/2019 FINDINGS: BRAIN: There is no acute infarct, acute hemorrhage or extra-axial collection. The white matter signal is normal for the patient's age. The cerebral and cerebellar  volume are age-appropriate. There is no hydrocephalus. The midline structures are normal. VASCULAR: The major intracranial arterial and venous sinus flow voids are normal. Susceptibility-sensitive sequences show no chronic microhemorrhage or superficial siderosis. SKULL AND UPPER CERVICAL SPINE: Calvarial bone marrow signal is normal. There is no skull base mass. The visualized upper cervical spine and soft tissues are normal. SINUSES/ORBITS: There are no fluid levels or advanced mucosal thickening. Small amount of bilateral mastoid fluid. The orbits are normal. IMPRESSION: Normal MRI of the brain. Electronically Signed   By: Ulyses Jarred M.D.   On: 06/20/2019 19:45    Assessment & Plan:   There are no diagnoses linked to this encounter.   No orders of the defined types were placed in this encounter.    Follow-up: No follow-ups on file.  Walker Kehr, MD

## 2019-07-26 NOTE — Assessment & Plan Note (Addendum)
- ?  vasculitis of ?etiology - confluent petechiae Sed rate, CRP, CBC Skin biopsy if not better.

## 2019-07-26 NOTE — Assessment & Plan Note (Signed)
L lat shin pain, post-fall in the Fall 2/20 Dr Veverly Fells  Keep ROV w/Dr Veverly Fells 2/9  Medrol dose pack

## 2019-07-26 NOTE — Assessment & Plan Note (Signed)
Medrol dose pack Tramadol prn  Potential benefits of a short/long term opioids use as well as potential risks (i.e. addiction risk, apnea etc) and complications (i.e. Somnolence, constipation and others) were explained to the patient and were aknowledged.

## 2019-07-27 ENCOUNTER — Encounter: Payer: Self-pay | Admitting: Internal Medicine

## 2019-08-07 ENCOUNTER — Encounter: Payer: Self-pay | Admitting: Podiatry

## 2019-08-07 ENCOUNTER — Other Ambulatory Visit: Payer: Self-pay

## 2019-08-07 ENCOUNTER — Ambulatory Visit: Payer: Medicare Other | Admitting: Podiatry

## 2019-08-07 VITALS — Temp 97.2°F

## 2019-08-07 DIAGNOSIS — M205X9 Other deformities of toe(s) (acquired), unspecified foot: Secondary | ICD-10-CM | POA: Diagnosis not present

## 2019-08-07 DIAGNOSIS — L600 Ingrowing nail: Secondary | ICD-10-CM | POA: Diagnosis not present

## 2019-08-07 MED ORDER — NEOMYCIN-POLYMYXIN-HC 3.5-10000-1 OT SOLN: OTIC | 1 refills | Status: DC

## 2019-08-07 MED ORDER — NEOMYCIN-POLYMYXIN-HC 3.5-10000-1 OT SOLN: OTIC | 0 refills | Status: DC

## 2019-08-07 NOTE — Patient Instructions (Signed)

## 2019-08-07 NOTE — Progress Notes (Signed)
Subjective:   Patient ID: Haley Armstrong, female   DOB: 74 y.o.   MRN: [id number]   HPI Patient presents stating she is developed a painful ingrown toenail of her right big toe and it is making it hard for her to wear shoe gear comfortably and also states the left one has had occasional drainage.  The right 1 bothers her more currently   ROS      Objective:  Physical Exam  Neurovascular status intact no other change in health history with patient also complaining of bunion deformity right over left with pain into the metatarsal phalangeal joint with inflammation of the joint surface noted with patient found to have incurvated right hallux lateral border with pain with palpation     Assessment:  Chronic ingrown toenail deformity right hallux with pain with patient also noted to have structural bunion deformity moderately tender     Plan:  H&P reviewed both conditions separately and today for the nail I recommended excision and explained procedure risk and infiltrated the right hallux 60 mg Xylocaine Marcaine mixture sterile prep and removed the lateral border exposed matrix and applied phenol 3 applications 30 seconds followed by alcohol lavage and sterile dressing.  Gave instructions on soaks and encouraged her to call with questions and wrote prescription for drops at this time.  Bunion I do not recommend treatment but did discuss possible injection in future like we did the left and she will wear orthotics to try to reduce stress against the joint

## 2019-08-16 ENCOUNTER — Other Ambulatory Visit: Payer: Self-pay | Admitting: Internal Medicine

## 2019-09-20 ENCOUNTER — Other Ambulatory Visit: Payer: Self-pay | Admitting: Internal Medicine

## 2019-10-27 ENCOUNTER — Telehealth: Payer: Self-pay | Admitting: Internal Medicine

## 2019-10-27 NOTE — Telephone Encounter (Signed)
New message:   Pt is calling and states she is having trouble with her legs and is wondering should she go and see a vein specialists. She states she is wearing compression hose. Please advise.

## 2019-10-27 NOTE — Telephone Encounter (Signed)
She can go to a Vein clinic.  Thanks

## 2019-10-27 NOTE — Telephone Encounter (Signed)
Please advise 

## 2019-10-31 NOTE — Telephone Encounter (Signed)
LMTCB to get right reason for referral

## 2019-11-01 ENCOUNTER — Ambulatory Visit: Payer: Medicare Other | Admitting: Podiatry

## 2019-11-01 ENCOUNTER — Other Ambulatory Visit: Payer: Self-pay

## 2019-11-01 ENCOUNTER — Encounter: Payer: Self-pay | Admitting: Podiatry

## 2019-11-01 VITALS — Temp 97.5°F

## 2019-11-01 DIAGNOSIS — D169 Benign neoplasm of bone and articular cartilage, unspecified: Secondary | ICD-10-CM

## 2019-11-01 DIAGNOSIS — M779 Enthesopathy, unspecified: Secondary | ICD-10-CM

## 2019-11-01 DIAGNOSIS — L84 Corns and callosities: Secondary | ICD-10-CM

## 2019-11-01 NOTE — Progress Notes (Signed)
Subjective:   Patient ID: Haley Armstrong, female   DOB: 74 y.o.   MRN: [id number]   HPI Patient presents stating she has had a painful lesion on the second digit of her right foot that is been inflamed and she has inflammation around the big toe joint left foot.  Patient is concerned about bone spur or other pathology and states her ingrown toenail that was fixed is doing well   ROS      Objective:  Physical Exam  Neurovascular status intact with patient found to have a keratotic lesion on the medial side digit to right hip and distal to phalangeal joint with keratotic tissue formation and is found to have inflammation of the first MPJ right foot with pain     Assessment:  Inflammatory capsulitis first MPJ left along with keratotic lesion digit to right and exostotic area     Plan:  H&P all conditions reviewed and today sterile debridement accomplished second digit explained excess doses and the possibility for exostectomy if symptoms persist and educated her on the results of x-ray.  I then went ahead and applied cushion to take pressure off the second digit right and I did do sterile prep and injected the first MPJ left but both dorsal medial and lateral capsular 3 mg Dexasone Kenalog 5 mg Xylocaine

## 2019-12-04 ENCOUNTER — Telehealth: Payer: Self-pay

## 2019-12-04 NOTE — Telephone Encounter (Signed)
1.Medication Requested: methyl- prednisolone  Dosage pack   2. Pharmacy (Name, [place], City):[id number] - [place], [place]  3. On Med List: Yes   4. Last Visit with PCP: 1.27.21   5. Next visit date with PCP: n/a    Agent: Please be advised that RX refills may take up to 3 business days. We ask that you follow-up with your pharmacy.

## 2019-12-05 MED ORDER — METHYLPREDNISOLONE 4 MG PO TBPK: ORAL_TABLET | ORAL | 0 refills | Status: DC

## 2019-12-05 NOTE — Telephone Encounter (Signed)
Md sent depo medrol to pof. Please schedule pt a f/u appt...Haley Armstrong

## 2019-12-05 NOTE — Telephone Encounter (Signed)
LVM for patient to call back and set up an appointment.

## 2019-12-05 NOTE — Telephone Encounter (Signed)
[name]

## 2019-12-15 ENCOUNTER — Other Ambulatory Visit: Payer: Self-pay | Admitting: Internal Medicine

## 2019-12-20 ENCOUNTER — Other Ambulatory Visit: Payer: Self-pay | Admitting: Internal Medicine

## 2019-12-20 NOTE — Telephone Encounter (Signed)
[place] Controlled Database Checked Last filled: 05/13/2019 180 LOV w/you:07/26/2019 Next appt w/you:01/04/2020

## 2019-12-28 ENCOUNTER — Telehealth: Payer: Self-pay | Admitting: Podiatry

## 2019-12-28 NOTE — Telephone Encounter (Signed)
Left vm for patient to call the office and schedule an appt. Pt had sent a MyChart message requesting an appt for a cortisone shot.

## 2020-01-04 ENCOUNTER — Other Ambulatory Visit: Payer: Self-pay

## 2020-01-04 ENCOUNTER — Encounter: Payer: Self-pay | Admitting: Internal Medicine

## 2020-01-04 ENCOUNTER — Ambulatory Visit: Payer: Medicare Other | Admitting: Internal Medicine

## 2020-01-04 VITALS — BP 136/72 | HR 66 | Temp 98.2°F | Ht 62.0 in | Wt 149.2 lb

## 2020-01-04 DIAGNOSIS — G43109 Migraine with aura, not intractable, without status migrainosus: Secondary | ICD-10-CM | POA: Diagnosis not present

## 2020-01-04 DIAGNOSIS — M2550 Pain in unspecified joint: Secondary | ICD-10-CM | POA: Diagnosis not present

## 2020-01-04 DIAGNOSIS — I10 Essential (primary) hypertension: Secondary | ICD-10-CM | POA: Diagnosis not present

## 2020-01-04 DIAGNOSIS — M545 Low back pain, unspecified: Secondary | ICD-10-CM

## 2020-01-04 DIAGNOSIS — R609 Edema, unspecified: Secondary | ICD-10-CM | POA: Diagnosis not present

## 2020-01-04 DIAGNOSIS — G43909 Migraine, unspecified, not intractable, without status migrainosus: Secondary | ICD-10-CM | POA: Insufficient documentation

## 2020-01-04 MED ORDER — VITAMIN D3 50 MCG (2000 UT) PO CAPS
2000.0000 [IU] | ORAL_CAPSULE | Freq: Every day | ORAL | 3 refills | Status: AC
Start: 2020-01-04 — End: ?

## 2020-01-04 MED ORDER — METHOCARBAMOL 500 MG PO TABS: ORAL_TABLET | ORAL | 0 refills | Status: DC

## 2020-01-04 MED ORDER — SUMATRIPTAN SUCCINATE 100 MG PO TABS
100.0000 mg | ORAL_TABLET | ORAL | 2 refills | Status: DC | PRN
Start: 2020-01-04 — End: 2021-01-03

## 2020-01-04 NOTE — Progress Notes (Signed)
Subjective:  Patient ID: Haley Armstrong, female    DOB: 1945/10/29  Age: 74 y.o. MRN: [id number]  CC: Follow-up   HPI Haley Armstrong presents for worsening HAs daily, IBS, arthralgias/FMS symptoms Working 40-45 h/wk at [employer], standing, walking and lifting heavy boxes all day. The pt had a head CT and brain MRI in 2020   Outpatient Medications Prior to Visit  Medication Sig Dispense Refill  . Alum & Mag Hydroxide-Simeth (GI COCKTAIL) SUSP suspension 12ml of 2% viscous lidocaine  59ml of Dicyclomine (10mg /28ml)  211ml of Maalox (400mg )  Take 5-23ml every 4-6 hours for esophageal spasms 1200 mL 1  . amLODipine (NORVASC) 2.5 MG tablet Take 1 tablet (2.5 mg total) by mouth daily. 90 tablet 3  . Apoaequorin (PREVAGEN) 10 MG CAPS Take by mouth.    Marland Kitchen b complex vitamins tablet Take 1 tablet by mouth daily.      . B Complex-Biotin-FA (SUPER QUINTS B-50) TABS Take by mouth.    . Cholecalciferol 1000 UNITS tablet Take 1,000 Units by mouth daily.      . diclofenac sodium (VOLTAREN) 1 % GEL Apply 4 g topically 4 (four) times daily. 100 Tube 11  . enalapril (VASOTEC) 5 MG tablet TAKE ONE (1) TABLET BY MOUTH TWO (2) TIMES DAILY 180 tablet 3  . hyoscyamine (LEVBID) 0.375 MG 12 hr tablet TAKE 1 TABLET BY MOUTH TWO TIMES A DAY 60 tablet 1  . hyoscyamine (LEVSIN SL) 0.125 MG SL tablet DISSOLVE 1-2 TABLETS UNDER THE TONGUE EVERY FOUR HOURS AS NEEDED FOR PAIN 100 tablet 2  . ipratropium (ATROVENT) 0.03 % nasal spray ipratropium bromide 21 mcg (0.03 %) nasal spray    . levothyroxine (SYNTHROID) 25 MCG tablet TAKE 1 TABLET BY MOUTH EVERY MORNING AS DIRECTED 90 tablet 3  . Lutein-Zeaxanthin 25-5 MG CAPS Take by mouth. Takes one daily    . meloxicam (MOBIC) 15 MG tablet 1 po qd pc prn 90 tablet 3  . methocarbamol (ROBAXIN) 500 MG tablet TAKE ONE TABLET BY MOUTH FOUR TIMES DAILY AS NEEDED 120 tablet 0  . Multiple Vitamins-Minerals (MULTIVITAMIN,TX-MINERALS) tablet Take 1 tablet by mouth daily.        Marland Kitchen neomycin-polymyxin-hydrocortisone (CORTISPORIN) OTIC solution Apply 1-2 drops to toe after soaking BID 10 mL 1  . NON FORMULARY Vitamin C, Osteo BiFlex, Vitamin B12 - PO daily    . Omega 3 1000 MG CAPS Take 2 capsules by mouth daily.    . pantoprazole (PROTONIX) 40 MG tablet Take 1 tablet (40 mg total) by mouth daily. 90 tablet 3  . polyethylene glycol powder (GLYCOLAX/MIRALAX) powder Take 255 g by mouth daily. Mix 17g of Miralax in 8oz of water daily 255 g 3  . pregabalin (LYRICA) 150 MG capsule TAKE 1 CAPSULE BY MOUTH TWICE A DAY 180 capsule 1  . Sennosides (SENNA LAX PO) Take by mouth. As needed     . triamcinolone cream (KENALOG) 0.1 % APPLY TO AFFECTED AREA UP TO TWICE DAILY AS NEEDED, AVOID FACE/GROIN/UNDERARMS    . LORazepam (ATIVAN) 0.5 MG tablet Take 1 by mouth every night at bedtime as needed for sleep (Patient not taking: Reported on 01/04/2020) 30 tablet 3  . methylPREDNISolone (MEDROL DOSEPAK) 4 MG TBPK tablet As directed (Patient not taking: Reported on 01/04/2020) 21 tablet 0  . traMADol (ULTRAM) 50 MG tablet tramadol 50 mg tablet  TAKE 1 TABLET (50 MG TOTAL) BY MOUTH EVERY 6 (SIX) HOURS AS NEEDED FOR UP TO 5 DAYS FOR SEVERE  PAIN. (Patient not taking: Reported on 01/04/2020)    . neomycin-polymyxin-hydrocortisone (CORTISPORIN) OTIC solution Apply 1-2 drops to toe after soaking twice a day 10 mL 0   No facility-administered medications prior to visit.    ROS: Review of Systems  Constitutional: Positive for fatigue and unexpected weight change. Negative for activity change, appetite change and chills.  HENT: Negative for congestion, mouth sores and sinus pressure.   Eyes: Negative for visual disturbance.  Respiratory: Negative for cough and chest tightness.   Gastrointestinal: Positive for abdominal distention, abdominal pain and constipation. Negative for anal bleeding, diarrhea, nausea and vomiting.  Genitourinary: Negative for difficulty urinating, frequency and vaginal pain.   Musculoskeletal: Positive for arthralgias and back pain. Negative for gait problem.  Skin: Negative for pallor and rash.  Neurological: Positive for dizziness and headaches. Negative for tremors, weakness and numbness.  Psychiatric/Behavioral: Negative for confusion and sleep disturbance.    Objective:  BP 136/72 (BP Location: Left Arm, Patient Position: Sitting, Cuff Size: Normal)   Pulse 66   Temp 98.2 F (36.8 C) (Oral)   Ht 5\' 2"  (1.575 m)   Wt 149 lb 3.2 oz (67.7 kg)   SpO2 98%   BMI 27.29 kg/m   BP Readings from Last 3 Encounters:  01/04/20 136/72  07/26/19 130/70  06/20/19 (!) 134/57    Wt Readings from Last 3 Encounters:  01/04/20 149 lb 3.2 oz (67.7 kg)  07/26/19 156 lb 6.4 oz (70.9 kg)  06/20/19 153 lb (69.4 kg)    Physical Exam Constitutional:      General: She is not in acute distress.    Appearance: She is well-developed.  HENT:     Head: Normocephalic.     Right Ear: External ear normal.     Left Ear: External ear normal.     Nose: Nose normal.  Eyes:     General:        Right eye: No discharge.        Left eye: No discharge.     Conjunctiva/sclera: Conjunctivae normal.     Pupils: Pupils are equal, round, and reactive to light.  Neck:     Thyroid: No thyromegaly.     Vascular: No JVD.     Trachea: No tracheal deviation.  Cardiovascular:     Rate and Rhythm: Normal rate and regular rhythm.     Heart sounds: Normal heart sounds.  Pulmonary:     Effort: No respiratory distress.     Breath sounds: No stridor. No wheezing.  Abdominal:     General: Bowel sounds are normal. There is no distension.     Palpations: Abdomen is soft. There is no mass.     Tenderness: There is no abdominal tenderness. There is no guarding or rebound.  Musculoskeletal:        General: Tenderness present.     Cervical back: Normal range of motion and neck supple.  Lymphadenopathy:     Cervical: No cervical adenopathy.  Skin:    Findings: No erythema or rash.   Neurological:     Mental Status: She is oriented to person, place, and time.     Cranial Nerves: No cranial nerve deficit.     Motor: No abnormal muscle tone.     Coordination: Coordination normal.     Deep Tendon Reflexes: Reflexes normal.  Psychiatric:        Behavior: Behavior normal.        Thought Content: Thought content normal.  Judgment: Judgment normal.     Lab Results  Component Value Date   WBC 6.5 07/26/2019   HGB 12.5 07/26/2019   HCT 36.9 07/26/2019   PLT 264.0 07/26/2019   GLUCOSE 87 07/26/2019   CHOL 163 05/18/2019   TRIG 63.0 05/18/2019   HDL 62.50 05/18/2019   LDLCALC 88 05/18/2019   ALT 29 06/20/2019   AST 32 06/20/2019   NA 135 07/26/2019   K 4.1 07/26/2019   CL 101 07/26/2019   CREATININE 0.92 07/26/2019   BUN 15 07/26/2019   CO2 27 07/26/2019   TSH 3.26 05/18/2019   INR 0.90 08/05/2009   HGBA1C 5.8 05/08/2016    CT Head Wo Contrast  Result Date: 06/20/2019 CLINICAL DATA:  74 year old female with vertigo and dizziness. EXAM: CT HEAD WITHOUT CONTRAST TECHNIQUE: Contiguous axial images were obtained from the base of the skull through the vertex without intravenous contrast. COMPARISON:  Brain MRI dated 07/26/2009. FINDINGS: Brain: The ventricles and sulci appropriate size for patient's age. Minimal periventricular and deep white matter chronic microvascular ischemic changes noted. There is no acute intracranial hemorrhage. No mass effect or midline shift. No extra-axial fluid collection. Vascular: No hyperdense vessel or unexpected calcification. Skull: Normal. Negative for fracture or focal lesion. Sinuses/Orbits: No acute finding. Other: None IMPRESSION: No acute intracranial pathology. Electronically Signed   By: Anner Crete M.D.   On: 06/20/2019 16:35   MR BRAIN WO CONTRAST  Result Date: 06/20/2019 CLINICAL DATA:  Dizziness with nausea and vomiting EXAM: MRI HEAD WITHOUT CONTRAST TECHNIQUE: Multiplanar, multiecho pulse sequences of the  brain and surrounding structures were obtained without intravenous contrast. COMPARISON:  Head CT 06/20/2019 FINDINGS: BRAIN: There is no acute infarct, acute hemorrhage or extra-axial collection. The white matter signal is normal for the patient's age. The cerebral and cerebellar volume are age-appropriate. There is no hydrocephalus. The midline structures are normal. VASCULAR: The major intracranial arterial and venous sinus flow voids are normal. Susceptibility-sensitive sequences show no chronic microhemorrhage or superficial siderosis. SKULL AND UPPER CERVICAL SPINE: Calvarial bone marrow signal is normal. There is no skull base mass. The visualized upper cervical spine and soft tissues are normal. SINUSES/ORBITS: There are no fluid levels or advanced mucosal thickening. Small amount of bilateral mastoid fluid. The orbits are normal. IMPRESSION: Normal MRI of the brain. Electronically Signed   By: Ulyses Jarred M.D.   On: 06/20/2019 19:45    Assessment & Plan:

## 2020-01-04 NOTE — Assessment & Plan Note (Signed)
Worse Imtrex prn Reduce stress

## 2020-01-04 NOTE — Assessment & Plan Note (Signed)
BP Readings from Last 3 Encounters:  01/04/20 136/72  07/26/19 130/70  06/20/19 (!) 134/57

## 2020-01-04 NOTE — Assessment & Plan Note (Signed)
Work less Vit D

## 2020-01-04 NOTE — Assessment & Plan Note (Signed)
?  worse. Discussed

## 2020-01-04 NOTE — Assessment & Plan Note (Signed)
Medrol pack if worse

## 2020-01-05 LAB — URINALYSIS
Bilirubin Urine: NEGATIVE
Glucose, UA: NEGATIVE
Hgb urine dipstick: NEGATIVE
Ketones, ur: NEGATIVE
Nitrite: NEGATIVE
Protein, ur: NEGATIVE
Specific Gravity, Urine: 1.006 (ref 1.001–1.03)
pH: 7 (ref 5.0–8.0)

## 2020-01-05 LAB — CBC WITH DIFFERENTIAL/PLATELET
Absolute Monocytes: 732 cells/uL (ref 200–950)
Basophils Absolute: 62 cells/uL (ref 0–200)
Basophils Relative: 0.8 %
Eosinophils Absolute: 169 cells/uL (ref 15–500)
Eosinophils Relative: 2.2 %
HCT: 39.2 % (ref 35.0–45.0)
Hemoglobin: 13.2 g/dL (ref 11.7–15.5)
Lymphs Abs: 2056 cells/uL (ref 850–3900)
MCH: 31.8 pg (ref 27.0–33.0)
MCHC: 33.7 g/dL (ref 32.0–36.0)
MCV: 94.5 fL (ref 80.0–100.0)
MPV: 10.3 fL (ref 7.5–12.5)
Monocytes Relative: 9.5 %
Neutro Abs: 4682 cells/uL (ref 1500–7800)
Neutrophils Relative %: 60.8 %
Platelets: 308 10*3/uL (ref 140–400)
RBC: 4.15 10*6/uL (ref 3.80–5.10)
RDW: 12.2 % (ref 11.0–15.0)
Total Lymphocyte: 26.7 %
WBC: 7.7 10*3/uL (ref 3.8–10.8)

## 2020-01-05 LAB — HEPATIC FUNCTION PANEL
AG Ratio: 2 (calc) (ref 1.0–2.5)
ALT: 18 U/L (ref 6–29)
AST: 26 U/L (ref 10–35)
Albumin: 4.3 g/dL (ref 3.6–5.1)
Alkaline phosphatase (APISO): 55 U/L (ref 37–153)
Bilirubin, Direct: 0.1 mg/dL (ref 0.0–0.2)
Globulin: 2.2 g/dL (calc) (ref 1.9–3.7)
Indirect Bilirubin: 0.4 mg/dL (calc) (ref 0.2–1.2)
Total Bilirubin: 0.5 mg/dL (ref 0.2–1.2)
Total Protein: 6.5 g/dL (ref 6.1–8.1)

## 2020-01-05 LAB — BASIC METABOLIC PANEL
BUN: 9 mg/dL (ref 7–25)
CO2: 28 mmol/L (ref 20–32)
Calcium: 9.5 mg/dL (ref 8.6–10.4)
Chloride: 100 mmol/L (ref 98–110)
Creat: 0.85 mg/dL (ref 0.60–0.93)
Glucose, Bld: 94 mg/dL (ref 65–99)
Potassium: 5.1 mmol/L (ref 3.5–5.3)
Sodium: 135 mmol/L (ref 135–146)

## 2020-01-05 LAB — TSH: TSH: 2.06 mIU/L (ref 0.40–4.50)

## 2020-01-12 ENCOUNTER — Telehealth: Payer: Self-pay | Admitting: Podiatry

## 2020-01-12 NOTE — Telephone Encounter (Signed)
Pt left message asking if the orthotics she received could be trimmed down. She has started working a job and on her feet for 9 hrs and the orthotics only fit in one of her shoes.  I called cell number and the voicemail is full. I called pts home and left message that I would be contacting her next week after discussing with Liliane Channel her options but it would depend on  the pts condition.

## 2020-01-17 ENCOUNTER — Telehealth: Payer: Self-pay | Admitting: Podiatry

## 2020-01-17 NOTE — Telephone Encounter (Signed)
Discussed with pt and she is going to call back and schedule an appt with Liliane Channel to see if he can cut down the orthotics or make any adjustments. She needs a 30 minute appt.

## 2020-01-21 ENCOUNTER — Other Ambulatory Visit: Payer: Self-pay

## 2020-01-21 ENCOUNTER — Emergency Department (HOSPITAL_BASED_OUTPATIENT_CLINIC_OR_DEPARTMENT_OTHER)
Admission: EM | Admit: 2020-01-21 | Discharge: 2020-01-21 | Disposition: A | Discharge status: Home / Self Care | Payer: Medicare Other | Attending: Emergency Medicine | Admitting: Emergency Medicine

## 2020-01-21 ENCOUNTER — Encounter (HOSPITAL_BASED_OUTPATIENT_CLINIC_OR_DEPARTMENT_OTHER): Payer: Self-pay | Admitting: *Deleted

## 2020-01-21 DIAGNOSIS — Z79899 Other long term (current) drug therapy: Secondary | ICD-10-CM | POA: Diagnosis not present

## 2020-01-21 DIAGNOSIS — M79674 Pain in right toe(s): Secondary | ICD-10-CM

## 2020-01-21 DIAGNOSIS — Z87891 Personal history of nicotine dependence: Secondary | ICD-10-CM | POA: Diagnosis not present

## 2020-01-21 DIAGNOSIS — I10 Essential (primary) hypertension: Secondary | ICD-10-CM | POA: Insufficient documentation

## 2020-01-21 MED ORDER — CEPHALEXIN 500 MG PO CAPS
500.0000 mg | ORAL_CAPSULE | Freq: Two times a day (BID) | ORAL | 0 refills | Status: AC
Start: 2020-01-21 — End: 2020-01-28

## 2020-01-21 NOTE — ED Provider Notes (Signed)
[facility] EMERGENCY DEPARTMENT Provider Note   CSN: [id number] Arrival date & time: 01/21/20  1309     History Chief Complaint  Patient presents with  . Toe Pain    Haley Armstrong is a 74 y.o. female.  74 yo F with a chief complaints of right fifth digit pain.  Is been going on for about a week or so.  She denies any trauma to the toe.  States that she did have some bruising to it initially but has had some persistent pain.  She just realized that she had some antibiotic drops of been scribed by a podiatrist in the past and she tried those yesterday without significant improvement.  She does think it works better currently than it has.  Denies fevers denies rapid spreading redness.  The history is provided by the patient.  Toe Pain This is a new problem. The current episode started 2 days ago. The problem occurs constantly. The problem has not changed since onset.Pertinent negatives include no chest pain, no headaches and no shortness of breath. Nothing aggravates the symptoms. Nothing relieves the symptoms. She has tried nothing for the symptoms. The treatment provided no relief.       Past Medical History:  Diagnosis Date  . Anemia, iron deficiency   . Anxiety   . Blood donor   . Family history of coronary artery disease 06/04/2016  . GERD (gastroesophageal reflux disease)   . Hypertension   . IBS (irritable bowel syndrome)   . Osteoarthritis     Patient Active Problem List   Diagnosis Date Noted  . Migraines 01/04/2020  . Arthralgia 01/04/2020  . Left leg pain 07/26/2019  . Rotator cuff tear arthropathy, right 05/18/2019  . Family history of coronary artery disease 06/04/2016  . Cellulitis of foot 02/07/2016  . Chest pain 05/16/2015  . Well adult exam 03/09/2013  . Edema 03/09/2013  . Pain in left foot 03/08/2013  . ADD (attention deficit disorder) 06/15/2012  . Compulsive behavior disorder ([place]) 06/15/2012  . Acute left lumbar radiculopathy  10/18/2011  . Iron deficiency anemia 10/21/2010  . History of esophageal spasm 10/21/2010  . FATIGUE, ACUTE 07/02/2009  . TOBACCO USE, QUIT 07/02/2009  . NONSPEC ABN FINDNG RAD & OTH EXAM ABDOMINAL AREA 04/11/2009  . Diarrhea 04/09/2009  . ABDOMINAL PAIN-EPIGASTRIC 04/09/2009  . Anxiety state 02/24/2009  . Rash and nonspecific skin eruption 09/30/2007  . Memory loss 09/21/2007  . Goiter, unspecified 09/09/2007  . Essential hypertension 05/24/2007  . PARESTHESIA 05/24/2007  . Cervicalgia 05/20/2007  . Other abnormal glucose 05/20/2007  . GERD 04/06/2007  . Irritable bowel syndrome 04/06/2007  . OSTEOARTHRITIS 04/06/2007  . LOW BACK PAIN 04/06/2007  . TROCHANTERIC BURSITIS 04/06/2007    Past Surgical History:  Procedure Laterality Date  . APPENDECTOMY    . CERVICAL FUSION  2011   C4-5 Dr Marcial Pacas  . CHOLECYSTECTOMY    . ESOPHAGEAL MANOMETRY N/A 11/13/2013   Procedure: ESOPHAGEAL MANOMETRY (EM);  Surgeon: Irene Shipper, MD;  Location: [facility];  Service: Endoscopy;  Laterality: N/A;     OB History   No obstetric history on file.     Family History  Problem Relation Age of Onset  . Lymphoma Mother   . Dementia Mother   . Uterine cancer Mother 26       uterine sarcoma  . Hypothyroidism Mother   . Parkinsonism Father   . Heart disease Brother        CAD  .  Heart attack Brother   . Heart disease Maternal Aunt   . Heart attack Maternal Aunt   . Colon cancer Neg Hx   . Esophageal cancer Neg Hx   . Rectal cancer Neg Hx   . Stomach cancer Neg Hx     Social History   Tobacco Use  . Smoking status: Former Research scientist (life sciences)  . Smokeless tobacco: Never Used  Vaping Use  . Vaping Use: Never used  Substance Use Topics  . Alcohol use: Yes    Comment: occ  . Drug use: No    Home Medications Prior to Admission medications   Medication Sig Start Date End Date Taking? Authorizing Provider  Alum & Mag Hydroxide-Simeth (GI COCKTAIL) SUSP suspension 46ml of 2% viscous  lidocaine  26ml of Dicyclomine (10mg /29ml)  250ml of Maalox (400mg )  Take 5-75ml every 4-6 hours for esophageal spasms 11/19/16   Levin Erp, PA  amLODipine (NORVASC) 2.5 MG tablet Take 1 tablet (2.5 mg total) by mouth daily. 05/18/19   [name], Evie Lacks, MD  Apoaequorin (PREVAGEN) 10 MG CAPS Take by mouth.    [provider]  b complex vitamins tablet Take 1 tablet by mouth daily.      [provider]  cephALEXin (KEFLEX) 500 MG capsule Take 1 capsule (500 mg total) by mouth 2 (two) times daily for 7 days. 01/21/20 01/28/20  Deno Etienne, DO  Cholecalciferol (VITAMIN D3) 50 MCG (2000 UT) capsule Take 1 capsule (2,000 Units total) by mouth daily. 01/04/20   [name], Evie Lacks, MD  diclofenac sodium (VOLTAREN) 1 % GEL Apply 4 g topically 4 (four) times daily. 01/08/16   Wallene Huh, DPM  enalapril (VASOTEC) 5 MG tablet TAKE ONE (1) TABLET BY MOUTH TWO (2) TIMES DAILY 05/18/19   [name], Evie Lacks, MD  hyoscyamine (LEVSIN SL) 0.125 MG SL tablet DISSOLVE 1-2 TABLETS UNDER THE TONGUE EVERY FOUR HOURS AS NEEDED FOR PAIN 04/26/17   [name], Evie Lacks, MD  ipratropium (ATROVENT) 0.03 % nasal spray ipratropium bromide 21 mcg (0.03 %) nasal spray    [provider]  levothyroxine (SYNTHROID) 25 MCG tablet TAKE 1 TABLET BY MOUTH EVERY MORNING AS DIRECTED 09/21/19   [name], Evie Lacks, MD  LORazepam (ATIVAN) 0.5 MG tablet Take 1 by mouth every night at bedtime as needed for sleep Patient not taking: Reported on 01/04/2020 05/18/19   [name], Evie Lacks, MD  Lutein-Zeaxanthin 25-5 MG CAPS Take by mouth. Takes one daily    [provider]  meloxicam (MOBIC) 15 MG tablet 1 po qd pc prn 05/18/19   [name], Evie Lacks, MD  methocarbamol (ROBAXIN) 500 MG tablet TAKE ONE TABLET BY MOUTH FOUR TIMES DAILY AS NEEDED 01/04/20   [name], Evie Lacks, MD  methylPREDNISolone (MEDROL DOSEPAK) 4 MG TBPK tablet As directed Patient not taking: Reported on 01/04/2020 12/05/19    [name], Evie Lacks, MD  Multiple Vitamins-Minerals (MULTIVITAMIN,TX-MINERALS) tablet Take 1 tablet by mouth daily.      [provider]  neomycin-polymyxin-hydrocortisone (CORTISPORIN) OTIC solution Apply 1-2 drops to toe after soaking BID 08/07/19   Regal, Tamala Fothergill, DPM  NON FORMULARY Vitamin C, Osteo BiFlex, Vitamin B12 - PO daily    [provider]  Omega 3 1000 MG CAPS Take 2 capsules by mouth daily.    [provider]  pantoprazole (PROTONIX) 40 MG tablet Take 1 tablet (40 mg total) by mouth daily. 05/18/19   [name], Evie Lacks, MD  polyethylene glycol powder (GLYCOLAX/MIRALAX) powder Take 255 g by  mouth daily. Mix 17g of Miralax in 8oz of water daily 11/19/16   Levin Erp, PA  pregabalin (LYRICA) 150 MG capsule TAKE 1 CAPSULE BY MOUTH TWICE A DAY 12/20/19   [name], Evie Lacks, MD  Sennosides (SENNA LAX PO) Take by mouth. As needed     [provider]  SUMAtriptan (IMITREX) 100 MG tablet Take 1 tablet (100 mg total) by mouth every 2 (two) hours as needed for migraine. May repeat in 2 hours if headache persists or recurs. 01/04/20   [name], Evie Lacks, MD  traMADol (ULTRAM) 50 MG tablet tramadol 50 mg tablet  TAKE 1 TABLET (50 MG TOTAL) BY MOUTH EVERY 6 (SIX) HOURS AS NEEDED FOR UP TO 5 DAYS FOR SEVERE PAIN. Patient not taking: Reported on 01/04/2020    [provider]  triamcinolone cream (KENALOG) 0.1 % APPLY TO AFFECTED AREA UP TO TWICE DAILY AS NEEDED, AVOID FACE/GROIN/UNDERARMS 01/31/19   [provider]    Allergies    Patient has no known allergies.  Review of Systems   Review of Systems  Constitutional: Negative for chills and fever.  HENT: Negative for congestion and rhinorrhea.   Eyes: Negative for redness and visual disturbance.  Respiratory: Negative for shortness of breath and wheezing.   Cardiovascular: Negative for chest pain and palpitations.  Gastrointestinal: Negative for nausea and vomiting.   Genitourinary: Negative for dysuria and urgency.  Musculoskeletal: Positive for arthralgias. Negative for myalgias.  Skin: Negative for pallor and wound.  Neurological: Negative for dizziness and headaches.    Physical Exam Updated Vital Signs BP (!) 144/77 (BP Location: Right Arm)   Pulse 62   Temp 98.3 F (36.8 C) (Oral)   Resp 18   Ht 5\' 4"  (1.626 m)   Wt 65.8 kg   SpO2 100%   BMI 24.89 kg/m   Physical Exam Vitals and nursing note reviewed.  Constitutional:      General: She is not in acute distress.    Appearance: She is well-developed. She is not diaphoretic.  HENT:     Head: Normocephalic and atraumatic.  Eyes:     Pupils: Pupils are equal, round, and reactive to light.  Cardiovascular:     Rate and Rhythm: Normal rate and regular rhythm.     Heart sounds: No murmur heard.  No friction rub. No gallop.   Pulmonary:     Effort: Pulmonary effort is normal.     Breath sounds: No wheezing or rales.  Abdominal:     General: There is no distension.     Palpations: Abdomen is soft.     Tenderness: There is no abdominal tenderness.  Musculoskeletal:        General: No tenderness.     Cervical back: Normal range of motion and neck supple.     Comments: Diffuse fungal disease to the nails of the toes of the right foot.  Mild erythema to the nailbed and extending distally.  No obvious tenderness on my exam.  Full range of motion of the toe.  Intact pulse motor and sensation.  No fluctuance or induration.  No drainage.  Skin:    General: Skin is warm and dry.  Neurological:     Mental Status: She is alert and oriented to person, place, and time.  Psychiatric:        Behavior: Behavior normal.     ED Results / Procedures / Treatments   Labs (all labs ordered are listed, but only abnormal results are displayed)  Labs Reviewed - No data to display  EKG None  Radiology No results found.  Procedures Procedures (including critical care time)  Medications Ordered  in ED Medications - No data to display  ED Course  I have reviewed the triage vital signs and the nursing notes.  Pertinent labs & imaging results that were available during my care of the patient were reviewed by me and considered in my medical decision making (see chart for details).    MDM Rules/Calculators/A&P                          74 yo F with a chief complaints of right fifth digit pain of the foot.  This is been going on for about a week now.  No known injury.  She describes what sounds like a hematoma initially.  Could be bruised from an injury that she did not realize.  She is concerned it might be infected.  Will try a course of antibiotics warm soaks podiatry follow-up.  4:02 PM:  I have discussed the diagnosis/risks/treatment options with the patient and believe the pt to be eligible for discharge home to follow-up with PCP. We also discussed returning to the ED immediately if new or worsening sx occur. We discussed the sx which are most concerning (e.g., sudden worsening pain, fever, inability to tolerate by mouth) that necessitate immediate return. Medications administered to the patient during their visit and any new prescriptions provided to the patient are listed below.  Medications given during this visit Medications - No data to display   The patient appears reasonably screen and/or stabilized for discharge and I doubt any other medical condition or other [facility] requiring further screening, evaluation, or treatment in the ED at this time prior to discharge.   Final Clinical Impression(s) / ED Diagnoses Final diagnoses:  Pain of toe of right foot    Rx / DC Orders ED Discharge Orders         Ordered    cephALEXin (KEFLEX) 500 MG capsule  2 times daily     Discontinue  Reprint     01/21/20 [place], Lynn Sissel, DO 01/21/20 [phone]

## 2020-01-21 NOTE — ED Triage Notes (Signed)
Pt c/o pain to her right pinky toe x 3-5 days. States the toenail is discolored and the pain is worse when she is standing. She has been using drops she got from her podiatrist for a different toe

## 2020-01-21 NOTE — Discharge Instructions (Signed)
Try warm soaks or compresses at least 4 times a day.  Take the antibiotics as prescribed.  Return for rapid spreading redness or if you get a fever.  Follow-up with your podiatrist.

## 2020-02-01 ENCOUNTER — Ambulatory Visit: Payer: Medicare Other | Admitting: Podiatry

## 2020-02-01 ENCOUNTER — Encounter: Payer: Self-pay | Admitting: Podiatry

## 2020-02-01 ENCOUNTER — Other Ambulatory Visit: Payer: Self-pay

## 2020-02-01 DIAGNOSIS — L600 Ingrowing nail: Secondary | ICD-10-CM | POA: Diagnosis not present

## 2020-02-01 DIAGNOSIS — M779 Enthesopathy, unspecified: Secondary | ICD-10-CM | POA: Diagnosis not present

## 2020-02-05 NOTE — Progress Notes (Signed)
Subjective:   Patient ID: Haley Armstrong, female   DOB: 74 y.o.   MRN: [id number]   HPI Patient presents stating that she has tried multiple pair of shoes orthotics do not seem to be taking pressure off currently and she has problems with her right fifth nail and it had turned dark and she was concerned.  Patient states that the forefoot has been very sore and the pain seems to come and go currently and she cannot describe it exactly   ROS      Objective:  Physical Exam  Neurovascular status intact muscle strength adequate with quite a bit of discomfort around the second MPJ right with fluid buildup noted around the joint surface that is painful when pressed and making walking difficult.  There is some thickness to the right fifth nail but I did not note any drainage erythema edema with this condition     Assessment:  Inflammatory capsulitis of the right second MPJ with nail disease right fifth nail that may be ingrown or may be a damaged nailbed with possible underlying infection     Plan:  H&P reviewed both conditions separately.  For the nail I did debrided I advised on soaks and cushioning and it may need to be removed which I explained to her leg.  For the right forefoot I did do a forefoot block 60 mg like Marcaine mixture sterile prep done and using sterile instrumentation I aspirated the joint getting out a small amount of clear fluid injected with quarter cc dexamethasone Kenalog and applied thick padding to reduce pressure on the joint surface

## 2020-02-22 ENCOUNTER — Ambulatory Visit: Payer: Medicare Other | Admitting: Podiatry

## 2020-02-23 ENCOUNTER — Encounter: Payer: Self-pay | Admitting: Podiatry

## 2020-02-23 ENCOUNTER — Ambulatory Visit: Payer: Medicare Other | Admitting: Podiatry

## 2020-02-23 ENCOUNTER — Other Ambulatory Visit: Payer: Self-pay

## 2020-02-23 DIAGNOSIS — L600 Ingrowing nail: Secondary | ICD-10-CM | POA: Diagnosis not present

## 2020-02-23 MED ORDER — NEOMYCIN-POLYMYXIN-HC 3.5-10000-1 OT SOLN
3.0000 [drp] | Freq: Four times a day (QID) | OTIC | 0 refills | Status: DC
Start: 2020-02-23 — End: 2021-07-29

## 2020-02-23 NOTE — Patient Instructions (Signed)

## 2020-02-26 NOTE — Progress Notes (Signed)
Subjective:   Patient ID: Haley Armstrong, female   DOB: 74 y.o.   MRN: [id number]   HPI Patient presents stating that the second nail has been by far the worst for her get her other problem is doing well she thinks she just wants that nail removed not the small nail   ROS      Objective:  Physical Exam  Neurovascular status intact negative Bevelyn Buckles' sign noted with patient's second nail right found to be very thickened and dystrophic and it is painful when pressed fifth nail as thickness but not painful and her joint is doing much better from previous treatment     Assessment:  Chronic nail disease with thickness and pain second right along with inflammatory capsulitis right     Plan:  H&P reviewed conditions and recommended nail removal explaining procedure risk and allowing her to sign consent form after review.  Today I infiltrated the second toe 60 mg like Marcaine mixture sterile prep done and using sterile instrumentation I removed the second nail exposed matrix and applied phenol 3 applications 30 seconds followed by alcohol lavage and sterile dressing.  Gave instructions on soaks and reappoint

## 2020-03-12 ENCOUNTER — Telehealth: Payer: Self-pay | Admitting: Internal Medicine

## 2020-03-12 NOTE — Telephone Encounter (Signed)
[id number] [address], [place] Phone:  [phone]  Fax:  [phone]     hyoscyamine (LEVSIN SL) 0.125 MG SL tablet  Patient requesting a refill

## 2020-03-13 MED ORDER — HYOSCYAMINE SULFATE 0.125 MG SL SUBL: SUBLINGUAL_TABLET | SUBLINGUAL | 2 refills | Status: DC

## 2020-03-13 NOTE — Telephone Encounter (Signed)
See med refill 03/13/2020

## 2020-06-03 ENCOUNTER — Other Ambulatory Visit: Payer: Self-pay | Admitting: Internal Medicine

## 2020-06-13 ENCOUNTER — Encounter: Payer: Self-pay | Admitting: Internal Medicine

## 2020-06-13 ENCOUNTER — Telehealth (INDEPENDENT_AMBULATORY_CARE_PROVIDER_SITE_OTHER): Payer: Medicare Other | Admitting: Internal Medicine

## 2020-06-13 DIAGNOSIS — J019 Acute sinusitis, unspecified: Secondary | ICD-10-CM | POA: Insufficient documentation

## 2020-06-13 DIAGNOSIS — J0110 Acute frontal sinusitis, unspecified: Secondary | ICD-10-CM | POA: Diagnosis not present

## 2020-06-13 MED ORDER — AMOXICILLIN-POT CLAVULANATE 875-125 MG PO TABS: 1.0000 | ORAL_TABLET | Freq: Two times a day (BID) | ORAL | 0 refills | Status: DC

## 2020-06-13 NOTE — Progress Notes (Signed)
Virtual Visit via Video Note  I connected with Haley Armstrong on 06/13/20 at  1:20 PM EST by a video enabled telemedicine application and verified that I am speaking with the correct person using two identifiers.  The patient and the provider were at separate locations throughout the entire encounter. Patient location: home, Provider location: work   I discussed the limitations of evaluation and management by telemedicine and the availability of in person appointments. The patient expressed understanding and agreed to proceed. The patient and the provider were the only parties present for the visit unless noted in HPI below.  History of Present Illness: The patient is a 74 y.o. female with visit for cough, sinus symptoms. Started about a week ago. Has been blowing her nose and some yellow stuff. Taking tusin over the counter and alka seltzer plus. Overall worsening. Mild SOB due to air not flowing well. Denies fevers or chills. Denies muscle aches or change in taste/smell. Overall it is not improving and worsening. Has had 3 shots vaccinated against covid-19 booster 05/31/20. Denies known sick contacts.  Observations/Objective: Appearance: normal, no coughing during visit, breathing appears normal speaking in full sentences without dyspnea, casual grooming, abdomen does not appear distended, throat not visualized due to mask, mental status is A and O times 3  Assessment and Plan: See problem oriented charting  Follow Up Instructions: rx augmentin  I discussed the assessment and treatment plan with the patient. The patient was provided an opportunity to ask questions and all were answered. The patient agreed with the plan and demonstrated an understanding of the instructions.   The patient was advised to call back or seek an in-person evaluation if the symptoms worsen or if the condition fails to improve as anticipated.  Hoyt Koch, MD

## 2020-06-13 NOTE — Assessment & Plan Note (Signed)
Rx augmentin.  

## 2020-07-03 ENCOUNTER — Telehealth: Payer: Self-pay | Admitting: Podiatry

## 2020-07-03 ENCOUNTER — Encounter: Payer: Self-pay | Admitting: Podiatry

## 2020-07-03 NOTE — Telephone Encounter (Signed)
Patient called and stated that her toe looks infected/its red on the bottom and on the top. She is using the drops and she is also soaking the toe. She is wondering what she should do? She is wondering if you can do a virtual visit since her work schedule is hectic? I did let her know to send a mychart picture

## 2020-07-04 ENCOUNTER — Other Ambulatory Visit: Payer: Self-pay | Admitting: Podiatry

## 2020-07-04 MED ORDER — DOXYCYCLINE HYCLATE 100 MG PO TABS: 100.0000 mg | ORAL_TABLET | Freq: Two times a day (BID) | ORAL | 0 refills | Status: DC

## 2020-07-04 NOTE — Telephone Encounter (Signed)
Called her in a antibiotic

## 2020-07-08 NOTE — Telephone Encounter (Signed)
If not improved she can start doxycycline

## 2020-07-17 ENCOUNTER — Other Ambulatory Visit: Payer: Self-pay | Admitting: Internal Medicine

## 2020-07-31 ENCOUNTER — Telehealth: Payer: Self-pay | Admitting: Internal Medicine

## 2020-07-31 NOTE — Telephone Encounter (Signed)
LVM for pt to rtn my call to schedule awv with nha. Please schedule this appt if pt calls the office.

## 2020-08-09 ENCOUNTER — Other Ambulatory Visit: Payer: Self-pay | Admitting: Internal Medicine

## 2020-09-01 ENCOUNTER — Other Ambulatory Visit: Payer: Self-pay | Admitting: Internal Medicine

## 2020-09-03 ENCOUNTER — Other Ambulatory Visit: Payer: Self-pay

## 2020-09-03 ENCOUNTER — Ambulatory Visit: Payer: Medicare Other | Admitting: Internal Medicine

## 2020-09-03 ENCOUNTER — Ambulatory Visit (INDEPENDENT_AMBULATORY_CARE_PROVIDER_SITE_OTHER): Payer: Medicare Other

## 2020-09-03 ENCOUNTER — Encounter: Payer: Self-pay | Admitting: Internal Medicine

## 2020-09-03 VITALS — BP 126/70 | HR 78 | Temp 98.3°F | Ht 62.0 in | Wt 140.8 lb

## 2020-09-03 VITALS — BP 126/70 | HR 78 | Temp 98.3°F | Ht 62.0 in | Wt 144.2 lb

## 2020-09-03 DIAGNOSIS — Z0000 Encounter for general adult medical examination without abnormal findings: Secondary | ICD-10-CM

## 2020-09-03 DIAGNOSIS — N3946 Mixed incontinence: Secondary | ICD-10-CM | POA: Insufficient documentation

## 2020-09-03 DIAGNOSIS — M2550 Pain in unspecified joint: Secondary | ICD-10-CM | POA: Diagnosis not present

## 2020-09-03 DIAGNOSIS — L03039 Cellulitis of unspecified toe: Secondary | ICD-10-CM | POA: Diagnosis not present

## 2020-09-03 DIAGNOSIS — I10 Essential (primary) hypertension: Secondary | ICD-10-CM | POA: Diagnosis not present

## 2020-09-03 DIAGNOSIS — Z0001 Encounter for general adult medical examination with abnormal findings: Secondary | ICD-10-CM

## 2020-09-03 DIAGNOSIS — D509 Iron deficiency anemia, unspecified: Secondary | ICD-10-CM

## 2020-09-03 DIAGNOSIS — N3281 Overactive bladder: Secondary | ICD-10-CM

## 2020-09-03 DIAGNOSIS — F413 Other mixed anxiety disorders: Secondary | ICD-10-CM

## 2020-09-03 DIAGNOSIS — E785 Hyperlipidemia, unspecified: Secondary | ICD-10-CM | POA: Diagnosis not present

## 2020-09-03 LAB — COMPREHENSIVE METABOLIC PANEL
ALT: 16 U/L (ref 0–35)
AST: 23 U/L (ref 0–37)
Albumin: 4.1 g/dL (ref 3.5–5.2)
Alkaline Phosphatase: 59 U/L (ref 39–117)
BUN: 17 mg/dL (ref 6–23)
CO2: 30 mEq/L (ref 19–32)
Calcium: 9.3 mg/dL (ref 8.4–10.5)
Chloride: 103 mEq/L (ref 96–112)
Creatinine, Ser: 0.82 mg/dL (ref 0.40–1.20)
GFR: 70.78 mL/min (ref >60.00)
Glucose, Bld: 94 mg/dL (ref 70–99)
Potassium: 5.3 mEq/L — ABNORMAL HIGH (ref 3.5–5.1)
Sodium: 139 mEq/L (ref 135–145)
Total Bilirubin: 0.3 mg/dL (ref 0.2–1.2)
Total Protein: 6.6 g/dL (ref 6.0–8.3)

## 2020-09-03 LAB — URINALYSIS, ROUTINE W REFLEX MICROSCOPIC
Bilirubin Urine: NEGATIVE
Hgb urine dipstick: NEGATIVE
Ketones, ur: NEGATIVE
Nitrite: NEGATIVE
RBC / HPF: NONE SEEN (ref 0–?)
Specific Gravity, Urine: 1.01 (ref 1.000–1.030)
Total Protein, Urine: NEGATIVE
Urine Glucose: NEGATIVE
Urobilinogen, UA: 0.2 (ref 0.0–1.0)
pH: 6.5 (ref 5.0–8.0)

## 2020-09-03 LAB — CBC WITH DIFFERENTIAL/PLATELET
Basophils Absolute: 0.1 10*3/uL (ref 0.0–0.1)
Basophils Relative: 1 % (ref 0.0–3.0)
Eosinophils Absolute: 0.2 10*3/uL (ref 0.0–0.7)
Eosinophils Relative: 2.9 % (ref 0.0–5.0)
HCT: 36.8 % (ref 36.0–46.0)
Hemoglobin: 12.6 g/dL (ref 12.0–15.0)
Lymphocytes Relative: 29.6 % (ref 12.0–46.0)
Lymphs Abs: 1.7 10*3/uL (ref 0.7–4.0)
MCHC: 34.3 g/dL (ref 30.0–36.0)
MCV: 94 fl (ref 78.0–100.0)
Monocytes Absolute: 0.6 10*3/uL (ref 0.1–1.0)
Monocytes Relative: 10.2 % (ref 3.0–12.0)
Neutro Abs: 3.3 10*3/uL (ref 1.4–7.7)
Neutrophils Relative %: 56.3 % (ref 43.0–77.0)
Platelets: 272 10*3/uL (ref 150.0–400.0)
RBC: 3.92 Mil/uL (ref 3.87–5.11)
RDW: 13.3 % (ref 11.5–15.5)
WBC: 5.8 10*3/uL (ref 4.0–10.5)

## 2020-09-03 LAB — LIPID PANEL
Cholesterol: 168 mg/dL (ref 0–200)
HDL: 67.1 mg/dL (ref >39.00)
LDL Cholesterol: 90 mg/dL (ref 0–99)
NonHDL: 100.4
Total CHOL/HDL Ratio: 2
Triglycerides: 54 mg/dL (ref 0.0–149.0)
VLDL: 10.8 mg/dL (ref 0.0–40.0)

## 2020-09-03 LAB — TSH: TSH: 3.42 u[IU]/mL (ref 0.35–4.50)

## 2020-09-03 MED ORDER — ENALAPRIL MALEATE 5 MG PO TABS: ORAL_TABLET | ORAL | 3 refills | Status: DC

## 2020-09-03 MED ORDER — DOXYCYCLINE HYCLATE 100 MG PO TABS: 100.0000 mg | ORAL_TABLET | Freq: Two times a day (BID) | ORAL | 1 refills | Status: DC

## 2020-09-03 MED ORDER — LEVOTHYROXINE SODIUM 25 MCG PO TABS: ORAL_TABLET | ORAL | 3 refills | Status: DC

## 2020-09-03 MED ORDER — AMLODIPINE BESYLATE 2.5 MG PO TABS: 2.5000 mg | ORAL_TABLET | Freq: Every day | ORAL | 3 refills | Status: DC

## 2020-09-03 NOTE — Assessment & Plan Note (Signed)
Cont w/Amlodipine, Enalapril

## 2020-09-03 NOTE — Progress Notes (Addendum)
Subjective:   Haley Armstrong is a 75 y.o. female who presents for Medicare Annual (Subsequent) preventive examination.  Review of Systems    No ROS. Medicare Wellness Visit. Additional risk factors are reflected in social history. Cardiac Risk Factors include: advanced age (>89men, >58 women);hypertension;family history of premature cardiovascular disease     Objective:    Today's Vitals   09/03/20 1041  BP: 126/70  Pulse: 78  Temp: 98.3 F (36.8 C)  SpO2: 98%  Weight: 140 lb 12.8 oz (63.9 kg)  Height: 5\' 2"  (1.575 m)  PainSc: 0-No pain   Body mass index is 25.75 kg/m.  Advanced Directives 09/03/2020 01/21/2020 06/20/2019  Does Patient Have a Medical Advance Directive? No No No  Would patient like information on creating a medical advance directive? No - Patient declined - -    Current Medications (verified) Outpatient Encounter Medications as of 09/03/2020  Medication Sig   Apoaequorin (PREVAGEN) 10 MG CAPS Take by mouth.   Cholecalciferol (VITAMIN D3) 50 MCG (2000 UT) capsule Take 1 capsule (2,000 Units total) by mouth daily.   diclofenac sodium (VOLTAREN) 1 % GEL Apply 4 g topically 4 (four) times daily.   hyoscyamine (LEVSIN SL) 0.125 MG SL tablet DISSOLVE 1-2 TABLETS UNDER THE TONGUE EVERY FOUR HOURS AS NEEDED FOR PAIN   ipratropium (ATROVENT) 0.03 % nasal spray ipratropium bromide 21 mcg (0.03 %) nasal spray   LORazepam (ATIVAN) 0.5 MG tablet Take 1 by mouth every night at bedtime as needed for sleep   Lutein-Zeaxanthin 25-5 MG CAPS Take by mouth. Takes one daily   meloxicam (MOBIC) 15 MG tablet TAKE 1 TABLET BY MOUTH EVERY DAY AFTER SURGERY AS NEEDED   methocarbamol (ROBAXIN) 500 MG tablet TAKE ONE TABLET BY MOUTH FOUR TIMES DAILY AS NEEDED   Multiple Vitamins-Minerals (MULTIVITAMIN,TX-MINERALS) tablet Take 1 tablet by mouth daily.     neomycin-polymyxin-hydrocortisone (CORTISPORIN) OTIC solution Place 3 drops into both ears 4 (four) times daily.   NON FORMULARY  Vitamin C, Osteo BiFlex, Vitamin B12 - PO daily   Omega 3 1000 MG CAPS Take 2 capsules by mouth daily.   pantoprazole (PROTONIX) 40 MG tablet TAKE 1 TABLET BY MOUTH EVERY DAY   polyethylene glycol powder (GLYCOLAX/MIRALAX) powder Take 255 g by mouth daily. Mix 17g of Miralax in 8oz of water daily   Sennosides (SENNA LAX PO) Take by mouth. As needed   SUMAtriptan (IMITREX) 100 MG tablet Take 1 tablet (100 mg total) by mouth every 2 (two) hours as needed for migraine. May repeat in 2 hours if headache persists or recurs.   traMADol (ULTRAM) 50 MG tablet    triamcinolone cream (KENALOG) 0.1 % APPLY TO AFFECTED AREA UP TO TWICE DAILY AS NEEDED, AVOID FACE/GROIN/UNDERARMS   [DISCONTINUED] Alum & Mag Hydroxide-Simeth (GI COCKTAIL) SUSP suspension 72ml of 2% viscous lidocaine  66ml of Dicyclomine (10mg /51ml)  233ml of Maalox (400mg )  Take 5-16ml every 4-6 hours for esophageal spasms   [DISCONTINUED] amLODipine (NORVASC) 2.5 MG tablet TAKE 1 TABLET BY MOUTH EVERY DAY   [DISCONTINUED] amoxicillin-clavulanate (AUGMENTIN) 875-125 MG tablet Take 1 tablet by mouth 2 (two) times daily.   [DISCONTINUED] b complex vitamins tablet Take 1 tablet by mouth daily.   (Patient not taking: No sig reported)   [DISCONTINUED] doxycycline (VIBRA-TABS) 100 MG tablet Take 1 tablet (100 mg total) by mouth 2 (two) times daily. (Patient not taking: Reported on 09/03/2020)   [DISCONTINUED] enalapril (VASOTEC) 5 MG tablet TAKE 1 TABLET 2 TIMES DAILY   [  DISCONTINUED] levothyroxine (SYNTHROID) 25 MCG tablet TAKE ONE TABLET BY MOUTH EVERY DAY BEFORE BREAKFAST   [DISCONTINUED] methylPREDNISolone (MEDROL DOSEPAK) 4 MG TBPK tablet As directed (Patient not taking: Reported on 09/03/2020)   [DISCONTINUED] neomycin-polymyxin-hydrocortisone (CORTISPORIN) OTIC solution Apply 1-2 drops to toe after soaking BID   [DISCONTINUED] pregabalin (LYRICA) 150 MG capsule TAKE 1 CAPSULE BY MOUTH TWICE A DAY   No facility-administered encounter  medications on file as of 09/03/2020.    Allergies (verified) Patient has no known allergies.   History: Past Medical History:  Diagnosis Date   Anemia, iron deficiency    Anxiety    Blood donor    Family history of coronary artery disease 06/04/2016   GERD (gastroesophageal reflux disease)    Hypertension    IBS (irritable bowel syndrome)    Osteoarthritis    Past Surgical History:  Procedure Laterality Date   APPENDECTOMY     CERVICAL FUSION  2011   C4-5 Dr Marcial Pacas   CHOLECYSTECTOMY     ESOPHAGEAL MANOMETRY N/A 11/13/2013   Procedure: ESOPHAGEAL MANOMETRY (EM);  Surgeon: Irene Shipper, MD;  Location: [facility];  Service: Endoscopy;  Laterality: N/A;   Family History  Problem Relation Age of Onset   Lymphoma Mother    Dementia Mother    Uterine cancer Mother 12       uterine sarcoma   Hypothyroidism Mother    Parkinsonism Father    Heart disease Brother        CAD   Heart attack Brother    Heart disease Maternal Aunt    Heart attack Maternal Aunt    Colon cancer Neg Hx    Esophageal cancer Neg Hx    Rectal cancer Neg Hx    Stomach cancer Neg Hx    Social History   Socioeconomic History   Marital status: Married    Spouse name: Not on file   Number of children: Not on file   Years of education: Not on file   Highest education level: Not on file  Occupational History   Occupation: Nutritional therapist    Comment: 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         Social Determinants of Health   Financial Resource Strain: Low Risk    Difficulty of Paying Living Expenses: Not hard at all  Food Insecurity: No Food  Insecurity   Worried About Charity fundraiser in the Last Year: Never true   [place] in the Last Year: Never true  Transportation Needs: No Transportation Needs   Lack of Transportation (Medical): No   Lack of Transportation (Non-Medical): No  Physical Activity: Sufficiently Active   Days of Exercise per Week: 5 days   Minutes of Exercise per Session: 30 min  Stress: No Stress Concern Present   Feeling of Stress : Not at all  Social Connections: Socially Integrated   Frequency of Communication with Friends and Family: More than three times a week   Frequency of Social Gatherings with Friends and Family: More than three times  a week   Attends Religious Services: More than 4 times per year   Active Member of Clubs or Organizations: Yes   Attends Music therapist: More than 4 times per year   Marital Status: Married    Tobacco Counseling Counseling given: Not Answered   Clinical Intake:  Pre-visit preparation completed: Yes  Pain : No/denies pain Pain Score: 0-No pain     BMI - recorded: 26.37 Nutritional Status: BMI 25 -29 Overweight Nutritional Risks: None Diabetes: No  How often do you need to have someone help you when you read instructions, pamphlets, or other written materials from your doctor or pharmacy?: 1 - Never What is the last grade level you completed in school?: Bachelor's Degree in Accounting  Diabetic? no  Interpreter Needed?: No  Information entered by :: Lisette Abu, LPN   Activities of Daily Living In your present state of health, do you have any difficulty performing the following activities: 09/03/2020  Hearing? N  Vision? N  Difficulty concentrating or making decisions? N  Walking or climbing stairs? N  Dressing or bathing? N  Doing errands, shopping? N  Preparing Food and eating ? N  Using the Toilet? N  In the past six months, have you accidently leaked urine? N  Do you have problems with loss of bowel control?  N  Managing your Medications? N  Managing your Finances? N  Housekeeping or managing your Housekeeping? N  Some recent data might be hidden    Patient Care Team: [name], Evie Lacks, MD as PCP - General Pedro Earls, MD as Attending Physician (Family Medicine) Erline Levine, MD as Consulting Physician (Neurosurgery) Sueanne Margarita, MD as Consulting Physician (Cardiology)  Indicate any recent Medical Services you may have received from other than [facility] providers in the past year (date may be approximate).     Assessment:   This is a routine wellness examination for [place].  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 0 0 0 No No  Number falls in past yr: 0 0 - - -  Injury with Fall? 0 0 - - -  Risk for fall due to : No Fall Risks No Fall Risks - - -  Follow up Falls evaluation completed - - - -    FALL RISK PREVENTION PERTAINING TO THE HOME:  Any stairs in or around the home? No  If so, are there any without handrails? No  Home free of loose throw rugs in walkways, pet beds, electrical cords, etc? Yes  Adequate lighting in your home to reduce risk of falls? Yes   ASSISTIVE DEVICES UTILIZED TO PREVENT FALLS:  Life alert? No  Use of a cane, walker or w/c? No  Grab bars in the bathroom? No   Shower chair  or bench in shower? No  Elevated toilet seat or a handicapped toilet? No   TIMED UP AND GO:  Was the test performed? No .  Length of time to ambulate 10 feet: 0 sec.   Gait steady and fast without use of assistive device  Cognitive Function: MMSE - Mini Mental State Exam 03/05/2016  Not completed: (No Data)        Immunizations Immunization History  Administered Date(s) Administered   Fluad Quad(high Dose 65+) 04/13/2019, 04/26/2020   Influenza Split 03/16/2011   Influenza Whole 05/24/2007, 03/21/2008, 05/29/2009, 05/06/2010   Influenza, High Dose Seasonal PF 04/26/2017   Influenza,inj,Quad PF,6+ Mos 03/25/2013, 03/21/2014, 04/18/2015, 03/05/2016   PFIZER(Purple Top)SARS-COV-2 Vaccination 05/31/2020   Pneumococcal Conjugate-13 04/18/2015   Pneumococcal Polysaccharide-23 06/07/2013   Td 04/02/2004   Tdap 03/25/2013   Zoster 11/09/2007   Zoster Recombinat (Shingrix) 11/09/2017, 01/14/2018    TDAP status: Up to date  Flu Vaccine status: Up to date  Pneumococcal vaccine status: Up to date  Covid-19 vaccine status: Completed vaccines  Qualifies for Shingles Vaccine? Yes   Zostavax completed Yes   Shingrix Completed?: Yes  Screening Tests Health Maintenance  Topic Date Due   MAMMOGRAM  10/28/2015   COVID-19 Vaccine (2 - Pfizer 3-dose series) 06/21/2020   TETANUS/TDAP  03/26/2023   COLONOSCOPY (Pts 45-4yrs Insurance coverage will need to be confirmed)  02/27/2026   INFLUENZA VACCINE  Completed   DEXA SCAN  Completed   Hepatitis C Screening  Completed   PNA vac Low Risk Adult  Completed   HPV VACCINES  Aged Out    Health Maintenance  Health Maintenance Due  Topic Date Due   MAMMOGRAM  10/28/2015   COVID-19 Vaccine (2 - Pfizer 3-dose series) 06/21/2020    Colorectal cancer screening: Type of screening: Colonoscopy. Completed 02/28/2016. Repeat every 10 years  Mammogram status: Completed 10/27/2013. Repeat every year  Bone Density status:  Completed 05/18/2019. Results reflect: Bone density results: NORMAL. Repeat every 0 years.  (no longer needed)  Lung Cancer Screening: (Low Dose CT Chest recommended if Age 60-80 years, 30 pack-year currently smoking OR have quit w/in 15years.) does qualify.   Lung Cancer Screening Referral: no  Additional Screening:  Hepatitis C Screening: does not qualify; Completed no  Vision Screening: Recommended annual ophthalmology exams for early detection of glaucoma and other disorders of the eye. Is the patient up to date with their annual eye exam?  Yes  Who is the provider or what is the name of the office in which the patient attends annual eye exams? Leighton Ruff, OD If pt is not established with a provider, would they like to be referred to a provider to establish care? No .   Dental Screening: Recommended annual dental exams for proper oral hygiene  Community Resource Referral / Chronic Care Management: CRR required this visit?  No   CCM required this visit?  No      Plan:     I have personally reviewed and noted the following in the patient's chart:   Medical and social history Use of alcohol, tobacco or illicit drugs  Current medications and supplements Functional ability and status Nutritional status Physical activity Advanced directives List of other physicians Hospitalizations, surgeries, and ER visits in previous 12 months Vitals Screenings to include cognitive, depression, and falls Referrals and appointments  In addition, I have reviewed and discussed with patient certain preventive protocols, quality metrics, and best practice recommendations. A written personalized care plan for preventive services  as well as general preventive health recommendations were provided to patient.     Sheral Flow, LPN   09/27/9377   Nurse Notes:  Medications reviewed with patient; no opioid use noted. Normal cognitive status assessed by direct observation by this Nurse  Health Advisor. No abnormalities found.    Medical screening examination/treatment/procedure(s) were performed by non-physician practitioner and as supervising physician I was immediately available for consultation/collaboration.  I agree with above. Lew Dawes, MD

## 2020-09-03 NOTE — Patient Instructions (Signed)
Paronychia Paronychia is an infection of the skin that surrounds a nail. It usually affects the skin around a fingernail, but it may also occur near a toenail. It often causes pain and swelling around the nail. In some cases, a collection of pus (abscess) can form near or under the nail.  This condition may develop suddenly, or it may develop gradually over a longer period. In most cases, paronychia is not serious, and it will clear up with treatment. What are the causes? This condition may be caused by bacteria or a fungus. These germs can enter the body through an opening in the skin, such as a cut or a hangnail. What increases the risk? This condition is more likely to develop in people who:  Get their hands wet often, such as those who work as Designer, industrial/product, bartenders, or nurses.  Bite their fingernails or suck their thumbs.  Trim their nails very short.  Have hangnails or injured fingertips.  Get manicures.  Have diabetes. What are the signs or symptoms? Symptoms of this condition include:  Redness and swelling of the skin near the nail.  Tenderness around the nail when you touch the area.  Pus-filled bumps under the skin at the base and sides of the nail (cuticle).  Fluid or pus under the nail.  Throbbing pain in the area. How is this diagnosed? This condition is diagnosed with a physical exam. In some cases, a sample of pus may be tested to determine what type of bacteria or fungus is causing the condition. How is this treated? Treatment depends on the cause and severity of your condition. If your condition is mild, it may clear up on its own in a few days or after soaking in warm water. If needed, treatment may include:  Antibiotic medicine, if your infection is caused by bacteria.  Antifungal medicine, if your infection is caused by a fungus.  A procedure to drain pus from an abscess.  Anti-inflammatory medicine (corticosteroids). Follow these instructions at  home: Wound care  Keep the affected area clean.  Soak the affected area in warm water, if told to do so by your health care provider. You may be told to do this for 20 minutes, 2-3 times a day.  Keep the area dry when you are not soaking it.  Do not try to drain an abscess yourself.  Follow instructions from your health care provider about how to take care of the affected area. Make sure you: ? Wash your hands with soap and water before you change your bandage (dressing). If soap and water are not available, use hand sanitizer. ? Change your dressing as told by your health care provider.  If you had an abscess drained, check the area every day for signs of infection. Check for: ? Redness, swelling, or pain. ? Fluid or blood. ? Warmth. ? Pus or a bad smell. Medicines  Take over-the-counter and prescription medicines only as told by your health care provider.  If you were prescribed an antibiotic medicine, take it as told by your health care provider. Do not stop taking the antibiotic even if you start to feel better.   General instructions  Avoid contact with harsh chemicals.  Do not pick at the affected area. Prevention  To prevent this condition from happening again: ? Wear rubber gloves when washing dishes or doing other tasks that require your hands to get wet. ? Wear gloves if your hands might come in contact with cleaners or other chemicals. ?  Avoid injuring your nails or fingertips. ? Do not bite your nails or tear hangnails. ? Do not cut your nails very short. ? Do not cut your cuticles. ? Use clean nail clippers or scissors when trimming nails. Contact a health care provider if:  Your symptoms get worse or do not improve with treatment.  You have continued or increased fluid, blood, or pus coming from the affected area.  Your finger or knuckle becomes swollen or difficult to move. Get help right away if you have:  A fever or chills.  Redness spreading away  from the affected area.  Joint or muscle pain. Summary  Paronychia is an infection of the skin that surrounds a nail. It often causes pain and swelling around the nail. In some cases, a collection of pus (abscess) can form near or under the nail.  This condition may be caused by bacteria or a fungus. These germs can enter the body through an opening in the skin, such as a cut or a hangnail.  If your condition is mild, it may clear up on its own in a few days. If needed, treatment may include medicine or a procedure to drain pus from an abscess.  To prevent this condition from happening again, wear gloves if doing tasks that require your hands to get wet or to come in contact with chemicals. Also avoid injuring your nails or fingertips. This information is not intended to replace advice given to you by your health care provider. Make sure you discuss any questions you have with your health care provider. Document Revised: 04/10/2020 Document Reviewed: 04/10/2020 Elsevier Patient Education  2021 [employer].

## 2020-09-03 NOTE — Assessment & Plan Note (Signed)
Tramadol prn - rare use  Potential benefits of a long term opioids use as well as potential risks (i.e. addiction risk, apnea etc) and complications (i.e. Somnolence, constipation and others) were explained to the patient and were aknowledged.

## 2020-09-03 NOTE — Assessment & Plan Note (Signed)
Chronic AZO prn taking BID

## 2020-09-03 NOTE — Assessment & Plan Note (Signed)
Rare use of Lorazepam  Potential benefits of a long term benzodiazepines  use as well as potential risks  and complications were explained to the patient and were aknowledged.

## 2020-09-03 NOTE — Assessment & Plan Note (Signed)
>>  ASSESSMENT AND PLAN FOR OAB (OVERACTIVE BLADDER) WRITTEN ON 09/03/2020 11:55 AM BY [name], Georgina Quint, MD  Chronic AZO prn taking BID

## 2020-09-03 NOTE — Assessment & Plan Note (Signed)
Doxy po x 7 d Soaks

## 2020-09-03 NOTE — Progress Notes (Addendum)
Subjective:  Patient ID: Haley Armstrong, female    DOB: [birth year]  Age: 75 y.o. MRN: [id number]  CC: Follow-up (6 month f/u)   HPI Haley Armstrong presents for R ingrown toenail F/u HTN, OA She is here with for a exam  Outpatient Medications Prior to Visit  Medication Sig Dispense Refill  . amLODipine (NORVASC) 2.5 MG tablet TAKE 1 TABLET BY MOUTH EVERY DAY 90 tablet 3  . amoxicillin-clavulanate (AUGMENTIN) 875-125 MG tablet Take 1 tablet by mouth 2 (two) times daily. 20 tablet 0  . Apoaequorin (PREVAGEN) 10 MG CAPS Take by mouth.    . Cholecalciferol (VITAMIN D3) 50 MCG (2000 UT) capsule Take 1 capsule (2,000 Units total) by mouth daily. 100 capsule 3  . diclofenac sodium (VOLTAREN) 1 % GEL Apply 4 g topically 4 (four) times daily. 100 Tube 11  . enalapril (VASOTEC) 5 MG tablet TAKE 1 TABLET 2 TIMES DAILY 60 tablet 5  . hyoscyamine (LEVSIN SL) 0.125 MG SL tablet DISSOLVE 1-2 TABLETS UNDER THE TONGUE EVERY FOUR HOURS AS NEEDED FOR PAIN 100 tablet 2  . ipratropium (ATROVENT) 0.03 % nasal spray ipratropium bromide 21 mcg (0.03 %) nasal spray    . levothyroxine (SYNTHROID) 25 MCG tablet TAKE ONE TABLET BY MOUTH EVERY DAY BEFORE BREAKFAST 90 tablet 3  . LORazepam (ATIVAN) 0.5 MG tablet Take 1 by mouth every night at bedtime as needed for sleep 30 tablet 3  . Lutein-Zeaxanthin 25-5 MG CAPS Take by mouth. Takes one daily    . meloxicam (MOBIC) 15 MG tablet TAKE 1 TABLET BY MOUTH EVERY DAY AFTER SURGERY AS NEEDED 90 tablet 2  . methocarbamol (ROBAXIN) 500 MG tablet TAKE ONE TABLET BY MOUTH FOUR TIMES DAILY AS NEEDED 120 tablet 0  . Multiple Vitamins-Minerals (MULTIVITAMIN,TX-MINERALS) tablet Take 1 tablet by mouth daily.      Marland Kitchen neomycin-polymyxin-hydrocortisone (CORTISPORIN) OTIC solution Apply 1-2 drops to toe after soaking BID 10 mL 1  . neomycin-polymyxin-hydrocortisone (CORTISPORIN) OTIC solution Place 3 drops into both ears 4 (four) times daily. 10 mL 0  . NON FORMULARY Vitamin C,  Osteo BiFlex, Vitamin B12 - PO daily    . Omega 3 1000 MG CAPS Take 2 capsules by mouth daily.    . pantoprazole (PROTONIX) 40 MG tablet TAKE 1 TABLET BY MOUTH EVERY DAY 90 tablet 3  . polyethylene glycol powder (GLYCOLAX/MIRALAX) powder Take 255 g by mouth daily. Mix 17g of Miralax in 8oz of water daily 255 g 3  . pregabalin (LYRICA) 150 MG capsule TAKE 1 CAPSULE BY MOUTH TWICE A DAY 180 capsule 1  . Sennosides (SENNA LAX PO) Take by mouth. As needed    . SUMAtriptan (IMITREX) 100 MG tablet Take 1 tablet (100 mg total) by mouth every 2 (two) hours as needed for migraine. May repeat in 2 hours if headache persists or recurs. 10 tablet 2  . traMADol (ULTRAM) 50 MG tablet     . triamcinolone cream (KENALOG) 0.1 % APPLY TO AFFECTED AREA UP TO TWICE DAILY AS NEEDED, AVOID FACE/GROIN/UNDERARMS    . Alum & Mag Hydroxide-Simeth (GI COCKTAIL) SUSP suspension 31ml of 2% viscous lidocaine  54ml of Dicyclomine (10mg /25ml)  240ml of Maalox (400mg )  Take 5-41ml every 4-6 hours for esophageal spasms 1200 mL 1  . b complex vitamins tablet Take 1 tablet by mouth daily.   (Patient not taking: No sig reported)    . doxycycline (VIBRA-TABS) 100 MG tablet Take 1 tablet (100 mg total) by mouth 2 (  two) times daily. (Patient not taking: Reported on 09/03/2020) 20 tablet 0  . methylPREDNISolone (MEDROL DOSEPAK) 4 MG TBPK tablet As directed (Patient not taking: Reported on 09/03/2020) 21 tablet 0   No facility-administered medications prior to visit.    ROS: Review of Systems  Constitutional: Negative for activity change, appetite change, chills, fatigue and unexpected weight change.  HENT: Negative for congestion, mouth sores and sinus pressure.   Eyes: Negative for visual disturbance.  Respiratory: Negative for cough and chest tightness.   Gastrointestinal: Negative for abdominal pain and nausea.  Genitourinary: Negative for difficulty urinating, frequency and vaginal pain.  Musculoskeletal: Positive for  arthralgias and back pain. Negative for gait problem.  Skin: Negative for pallor and rash.  Neurological: Positive for headaches. Negative for dizziness, tremors, weakness and numbness.  Psychiatric/Behavioral: Negative for confusion and sleep disturbance.    Objective:  BP 126/70 (BP Location: Left Arm)   Pulse 78   Temp 98.3 F (36.8 C) (Oral)   Ht 5\' 2"  (1.575 m)   Wt 144 lb 3.2 oz (65.4 kg)   SpO2 98%   BMI 26.37 kg/m   BP Readings from Last 3 Encounters:  09/03/20 126/70  09/03/20 126/70  01/21/20 (!) 144/77    Wt Readings from Last 3 Encounters:  09/03/20 144 lb 3.2 oz (65.4 kg)  09/03/20 140 lb 12.8 oz (63.9 kg)  01/21/20 145 lb (65.8 kg)    Physical Exam Constitutional:      General: She is not in acute distress.    Appearance: She is well-developed.  HENT:     Head: Normocephalic.     Right Ear: External ear normal.     Left Ear: External ear normal.     Nose: Nose normal.  Eyes:     General:        Right eye: No discharge.        Left eye: No discharge.     Conjunctiva/sclera: Conjunctivae normal.     Pupils: Pupils are equal, round, and reactive to light.  Neck:     Thyroid: No thyromegaly.     Vascular: No JVD.     Trachea: No tracheal deviation.  Cardiovascular:     Rate and Rhythm: Normal rate and regular rhythm.     Heart sounds: Normal heart sounds.  Pulmonary:     Effort: No respiratory distress.     Breath sounds: No stridor. No wheezing.  Abdominal:     General: Bowel sounds are normal. There is no distension.     Palpations: Abdomen is soft. There is no mass.     Tenderness: There is no abdominal tenderness. There is no guarding or rebound.  Musculoskeletal:        General: No tenderness.     Cervical back: Normal range of motion and neck supple.  Lymphadenopathy:     Cervical: No cervical adenopathy.  Skin:    Findings: No erythema or rash.  Neurological:     Mental Status: She is oriented to person, place, and time.     Cranial  Nerves: No cranial nerve deficit.     Motor: No abnormal muscle tone.     Coordination: Coordination normal.     Deep Tendon Reflexes: Reflexes normal.  Psychiatric:        Behavior: Behavior normal.        Thought Content: Thought content normal.        Judgment: Judgment normal.     Lab Results  Component Value Date  WBC 7.7 01/04/2020   HGB 13.2 01/04/2020   HCT 39.2 01/04/2020   PLT 308 01/04/2020   GLUCOSE 94 01/04/2020   CHOL 163 05/18/2019   TRIG 63.0 05/18/2019   HDL 62.50 05/18/2019   LDLCALC 88 05/18/2019   ALT 18 01/04/2020   AST 26 01/04/2020   NA 135 01/04/2020   K 5.1 01/04/2020   CL 100 01/04/2020   CREATININE 0.85 01/04/2020   BUN 9 01/04/2020   CO2 28 01/04/2020   TSH 2.06 01/04/2020   INR 0.90 08/05/2009   HGBA1C 5.8 05/08/2016    No results found.  Assessment & Plan:    Walker Kehr, MD

## 2020-09-03 NOTE — Patient Instructions (Signed)
Haley Armstrong , Thank you for taking time to come for your Medicare Wellness Visit. I appreciate your ongoing commitment to your health goals. Please review the following plan we discussed and let me know if I can assist you in the future.   Screening recommendations/referrals: Colonoscopy: 02/28/2016; due every 10 years Mammogram: 10/27/2013; due every year Bone Density: 05/18/2019; no longer needed Recommended yearly ophthalmology/optometry visit for glaucoma screening and checkup Recommended yearly dental visit for hygiene and checkup  Vaccinations: Influenza vaccine: 04/26/2020 Pneumococcal vaccine: 06/07/2013, 10/202016 Tdap vaccine: 03/25/2013 Shingles vaccine: 11/09/2017, 01/14/2018   Covid-19: 08/02/2019, 09/06/2019, 05/31/2020  Advanced directives: Advance directive discussed with you today. Even though you declined this today please call our office should you change your mind and we can give you the proper paperwork for you to fill out.  Conditions/risks identified: Yes; Reviewed health maintenance screenings with patient today and relevant education, vaccines, and/or referrals were provided. Please continue to do your personal lifestyle choices by: daily care of teeth and gums, regular physical activity (goal should be 5 days a week for 30 minutes), eat a healthy diet, avoid tobacco and drug use, limiting any alcohol intake, taking a low-dose aspirin (if not allergic or have been advised by your provider otherwise) and taking vitamins and minerals as recommended by your provider. Continue doing brain stimulating activities (puzzles, reading, adult coloring books, staying active) to keep memory sharp. Continue to eat heart healthy diet (full of fruits, vegetables, whole grains, lean protein, water--limit salt, fat, and sugar intake) and increase physical activity as tolerated.  Next appointment: Please schedule your next Medicare Wellness Visit with your Nurse Health Advisor in 1 year by calling  [phone].  Preventive Care 40 Years and Older, Female Preventive care refers to lifestyle choices and visits with your health care provider that can promote health and wellness. What does preventive care include?  A yearly physical exam. This is also called an annual well check.  Dental exams once or twice a year.  Routine eye exams. Ask your health care provider how often you should have your eyes checked.  Personal lifestyle choices, including:  Daily care of your teeth and gums.  Regular physical activity.  Eating a healthy diet.  Avoiding tobacco and drug use.  Limiting alcohol use.  Practicing safe sex.  Taking low-dose aspirin every day.  Taking vitamin and mineral supplements as recommended by your health care provider. What happens during an annual well check? The services and screenings done by your health care provider during your annual well check will depend on your age, overall health, lifestyle risk factors, and family history of disease. Counseling  Your health care provider may ask you questions about your:  Alcohol use.  Tobacco use.  Drug use.  Emotional well-being.  Home and relationship well-being.  Sexual activity.  Eating habits.  History of falls.  Memory and ability to understand (cognition).  Work and work Statistician.  Reproductive health. Screening  You may have the following tests or measurements:  Height, weight, and BMI.  Blood pressure.  Lipid and cholesterol levels. These may be checked every 5 years, or more frequently if you are over 66 years old.  Skin check.  Lung cancer screening. You may have this screening every year starting at age 32 if you have a 30-pack-year history of smoking and currently smoke or have quit within the past 15 years.  Fecal occult blood test (FOBT) of the stool. You may have this test every year starting at  age 77.  Flexible sigmoidoscopy or colonoscopy. You may have a sigmoidoscopy  every 5 years or a colonoscopy every 10 years starting at age 67.  Hepatitis C blood test.  Hepatitis B blood test.  Sexually transmitted disease (STD) testing.  Diabetes screening. This is done by checking your blood sugar (glucose) after you have not eaten for a while (fasting). You may have this done every 1-3 years.  Bone density scan. This is done to screen for osteoporosis. You may have this done starting at age 34.  Mammogram. This may be done every 1-2 years. Talk to your health care provider about how often you should have regular mammograms. Talk with your health care provider about your test results, treatment options, and if necessary, the need for more tests. Vaccines  Your health care provider may recommend certain vaccines, such as:  Influenza vaccine. This is recommended every year.  Tetanus, diphtheria, and acellular pertussis (Tdap, Td) vaccine. You may need a Td booster every 10 years.  Zoster vaccine. You may need this after age 35.  Pneumococcal 13-valent conjugate (PCV13) vaccine. One dose is recommended after age 4.  Pneumococcal polysaccharide (PPSV23) vaccine. One dose is recommended after age 40. Talk to your health care provider about which screenings and vaccines you need and how often you need them. This information is not intended to replace advice given to you by your health care provider. Make sure you discuss any questions you have with your health care provider. Document Released: 07/12/2015 Document Revised: 03/04/2016 Document Reviewed: 04/16/2015 Elsevier Interactive Patient Education  2017 [place] Prevention in the Home Falls can cause injuries. They can happen to people of all ages. There are many things you can do to make your home safe and to help prevent falls. What can I do on the outside of my home?  Regularly fix the edges of walkways and driveways and fix any cracks.  Remove anything that might make you trip as you walk  through a door, such as a raised step or threshold.  Trim any bushes or trees on the path to your home.  Use bright outdoor lighting.  Clear any walking paths of anything that might make someone trip, such as rocks or tools.  Regularly check to see if handrails are loose or broken. Make sure that both sides of any steps have handrails.  Any raised decks and porches should have guardrails on the edges.  Have any leaves, snow, or ice cleared regularly.  Use sand or salt on walking paths during winter.  Clean up any spills in your garage right away. This includes oil or grease spills. What can I do in the bathroom?  Use night lights.  Install grab bars by the toilet and in the tub and shower. Do not use towel bars as grab bars.  Use non-skid mats or decals in the tub or shower.  If you need to sit down in the shower, use a plastic, non-slip stool.  Keep the floor dry. Clean up any water that spills on the floor as soon as it happens.  Remove soap buildup in the tub or shower regularly.  Attach bath mats securely with double-sided non-slip rug tape.  Do not have throw rugs and other things on the floor that can make you trip. What can I do in the bedroom?  Use night lights.  Make sure that you have a light by your bed that is easy to reach.  Do not use any  sheets or blankets that are too big for your bed. They should not hang down onto the floor.  Have a firm chair that has side arms. You can use this for support while you get dressed.  Do not have throw rugs and other things on the floor that can make you trip. What can I do in the kitchen?  Clean up any spills right away.  Avoid walking on wet floors.  Keep items that you use a lot in easy-to-reach places.  If you need to reach something above you, use a strong step stool that has a grab bar.  Keep electrical cords out of the way.  Do not use floor polish or wax that makes floors slippery. If you must use wax,  use non-skid floor wax.  Do not have throw rugs and other things on the floor that can make you trip. What can I do with my stairs?  Do not leave any items on the stairs.  Make sure that there are handrails on both sides of the stairs and use them. Fix handrails that are broken or loose. Make sure that handrails are as long as the stairways.  Check any carpeting to make sure that it is firmly attached to the stairs. Fix any carpet that is loose or worn.  Avoid having throw rugs at the top or bottom of the stairs. If you do have throw rugs, attach them to the floor with carpet tape.  Make sure that you have a light switch at the top of the stairs and the bottom of the stairs. If you do not have them, ask someone to add them for you. What else can I do to help prevent falls?  Wear shoes that:  Do not have high heels.  Have rubber bottoms.  Are comfortable and fit you well.  Are closed at the toe. Do not wear sandals.  If you use a stepladder:  Make sure that it is fully opened. Do not climb a closed stepladder.  Make sure that both sides of the stepladder are locked into place.  Ask someone to hold it for you, if possible.  Clearly mark and make sure that you can see:  Any grab bars or handrails.  First and last steps.  Where the edge of each step is.  Use tools that help you move around (mobility aids) if they are needed. These include:  Canes.  Walkers.  Scooters.  Crutches.  Turn on the lights when you go into a dark area. Replace any light bulbs as soon as they burn out.  Set up your furniture so you have a clear path. Avoid moving your furniture around.  If any of your floors are uneven, fix them.  If there are any pets around you, be aware of where they are.  Review your medicines with your doctor. Some medicines can make you feel dizzy. This can increase your chance of falling. Ask your doctor what other things that you can do to help prevent  falls. This information is not intended to replace advice given to you by your health care provider. Make sure you discuss any questions you have with your health care provider. Document Released: 04/11/2009 Document Revised: 11/21/2015 Document Reviewed: 07/20/2014 Elsevier Interactive Patient Education  2017 [employer].

## 2020-09-04 ENCOUNTER — Other Ambulatory Visit: Payer: Self-pay | Admitting: Internal Medicine

## 2020-09-16 DIAGNOSIS — N814 Uterovaginal prolapse, unspecified: Secondary | ICD-10-CM | POA: Insufficient documentation

## 2020-09-23 NOTE — Assessment & Plan Note (Signed)
  We discussed age appropriate health related issues, including available/recomended screening tests and vaccinations. Labs were ordered to be later reviewed . All questions were answered. We discussed one or more of the following - seat belt use, use of sunscreen/sun exposure exercise, safe sex, fall risk reduction, second hand smoke exposure, firearm use and storage, seat belt use, a need for adhering to healthy diet and exercise. Labs were ordered.  All questions were answered.  2017 CTA IMPRESSION: Coronary artery calcium score of 0 Agatston units, suggesting low risk for future cardiac events. Colonoscopy 2017

## 2020-09-23 NOTE — Addendum Note (Signed)
Addended by: Cassandria Anger on: 09/23/2020 07:13 AM   Modules accepted: Level of Service

## 2021-01-03 ENCOUNTER — Telehealth: Payer: Self-pay | Admitting: Internal Medicine

## 2021-01-03 MED ORDER — SUMATRIPTAN SUCCINATE 100 MG PO TABS: 100.0000 mg | ORAL_TABLET | ORAL | 2 refills | Status: AC | PRN

## 2021-01-03 NOTE — Telephone Encounter (Signed)
1.Medication Requested: SUMAtriptan (IMITREX) 100 MG tablet   2. Pharmacy (Name, Street, [place]): [id number] - [place], [place]  3. On Med List: yes   4. Last Visit with PCP: 09-03-20  5. Next visit date with PCP: n/a    Agent: Please be advised that RX refills may take up to 3 business days. We ask that you follow-up with your pharmacy.

## 2021-01-03 NOTE — Telephone Encounter (Signed)
Reviewed chart pt is up-to-date sent refills to [facility].../lmb  

## 2021-01-06 ENCOUNTER — Telehealth: Payer: Self-pay | Admitting: Internal Medicine

## 2021-01-06 NOTE — Telephone Encounter (Signed)
   Patient called and was wondering if something for Vasculitis could be called in to [id number] - [place], [place].

## 2021-01-07 NOTE — Telephone Encounter (Signed)
I am not sure what Ginelle means by vasculitis.  Please schedule an office visit.  Thanks

## 2021-01-08 ENCOUNTER — Other Ambulatory Visit: Payer: Self-pay | Admitting: Internal Medicine

## 2021-01-08 NOTE — Telephone Encounter (Signed)
Called pt, she will call back to schedule an appointment

## 2021-01-13 ENCOUNTER — Ambulatory Visit (INDEPENDENT_AMBULATORY_CARE_PROVIDER_SITE_OTHER): Payer: Medicare Other

## 2021-01-13 ENCOUNTER — Encounter: Payer: Self-pay | Admitting: Podiatry

## 2021-01-13 ENCOUNTER — Other Ambulatory Visit: Payer: Self-pay

## 2021-01-13 ENCOUNTER — Ambulatory Visit: Payer: Medicare Other | Admitting: Podiatry

## 2021-01-13 DIAGNOSIS — L84 Corns and callosities: Secondary | ICD-10-CM | POA: Diagnosis not present

## 2021-01-13 DIAGNOSIS — M79672 Pain in left foot: Secondary | ICD-10-CM

## 2021-01-13 DIAGNOSIS — M79671 Pain in right foot: Secondary | ICD-10-CM

## 2021-01-13 DIAGNOSIS — M779 Enthesopathy, unspecified: Secondary | ICD-10-CM | POA: Diagnosis not present

## 2021-01-13 DIAGNOSIS — M2041 Other hammer toe(s) (acquired), right foot: Secondary | ICD-10-CM | POA: Diagnosis not present

## 2021-01-13 DIAGNOSIS — D169 Benign neoplasm of bone and articular cartilage, unspecified: Secondary | ICD-10-CM

## 2021-01-13 MED ORDER — TRIAMCINOLONE ACETONIDE 10 MG/ML IJ SUSP
10.0000 mg | Freq: Once | INTRAMUSCULAR | Status: AC
  Administered 2021-01-13: 10 mg

## 2021-01-14 ENCOUNTER — Ambulatory Visit: Payer: Medicare Other | Admitting: Internal Medicine

## 2021-01-14 ENCOUNTER — Encounter: Payer: Self-pay | Admitting: Internal Medicine

## 2021-01-14 DIAGNOSIS — Z299 Encounter for prophylactic measures, unspecified: Secondary | ICD-10-CM

## 2021-01-14 DIAGNOSIS — I10 Essential (primary) hypertension: Secondary | ICD-10-CM

## 2021-01-14 DIAGNOSIS — IMO0001 Reserved for inherently not codable concepts without codable children: Secondary | ICD-10-CM | POA: Insufficient documentation

## 2021-01-14 DIAGNOSIS — F902 Attention-deficit hyperactivity disorder, combined type: Secondary | ICD-10-CM | POA: Diagnosis not present

## 2021-01-14 DIAGNOSIS — F413 Other mixed anxiety disorders: Secondary | ICD-10-CM

## 2021-01-14 DIAGNOSIS — R413 Other amnesia: Secondary | ICD-10-CM | POA: Diagnosis not present

## 2021-01-14 MED ORDER — MELOXICAM 15 MG PO TABS: ORAL_TABLET | ORAL | 2 refills | Status: DC

## 2021-01-14 MED ORDER — METHYLPREDNISOLONE 4 MG PO TBPK: ORAL_TABLET | ORAL | 0 refills | Status: DC

## 2021-01-14 NOTE — Assessment & Plan Note (Signed)
Discussed treatment options like Wellbutrin, Nicoderm etc discussed

## 2021-01-14 NOTE — Assessment & Plan Note (Addendum)
Continue with amlodipine, Enalapril

## 2021-01-14 NOTE — Progress Notes (Signed)
Subjective:   Patient ID: Haley Armstrong, female   DOB: 74 y.o.   MRN: [id number]   HPI Patient presents with numerous different problems with the worst being a keratotic lesion on the fourth digit right foot is very painful with rotated fourth digit pressing against the third toe with incurvated nail bed right hallux medial border and on the left there is inflammation fluid around the fifth MPJ with pain.  Patient states these been going on for a while she is just been holding off   ROS      Objective:  Physical Exam  Neurovascular status intact with keratotic lesion on the distal medial aspect of digit 4 right with rotational component of the toe and pain with palpation.  I also noted incurvated medial border of the right hallux that is painful when pressed with no active drainage or redness and inflammation pain around the fifth metatarsal head left     Assessment:  Hammertoe deformity right along with rotational component exostotic keratotic lesion inner side fourth digit right with inflamed fifth MPJ left and ingrown toenail deformity right hallux     Plan:  H&P x-rays reviewed discussed condition debrided lesion right debrided out the nailbed on the medial border right hallux and did sterile prep and injected the fifth MPJ left 3 mg Dexasone Kenalog.  I do think the ingrown need to be fixed but we will see how she responds to this conservative treatment and possibly for distal hammertoe and exostectomy may need to be done and we will evaluate the left foot response to medication  X-rays indicate rotated toe with pressure occurring between the 2 digits right foot and prominence around the fifth metatarsal head left

## 2021-01-14 NOTE — Assessment & Plan Note (Addendum)
Continue with rare use of Lorazepam  Potential benefits of a long term benzodiazepines  use as well as potential risks  and complications were explained to the patient and were aknowledged.

## 2021-01-14 NOTE — Progress Notes (Signed)
Subjective:  Patient ID: Haley Armstrong, female    DOB: 20-Mar-1946  Age: 75 y.o. MRN: [id number]  CC: Follow-up (4 month f/u)   HPI Haley Armstrong presents for forgetfulness, poor focus, stress - work, sick husband C/o recurrent leg rash  Pt is planning to go to [place] - take ASA for travel   Outpatient Medications Prior to Visit  Medication Sig Dispense Refill   amLODipine (NORVASC) 2.5 MG tablet Take 1 tablet (2.5 mg total) by mouth daily. 90 tablet 3   Apoaequorin (PREVAGEN) 10 MG CAPS Take by mouth.     Cholecalciferol (VITAMIN D3) 50 MCG (2000 UT) capsule Take 1 capsule (2,000 Units total) by mouth daily. 100 capsule 3   diclofenac sodium (VOLTAREN) 1 % GEL Apply 4 g topically 4 (four) times daily. 100 Tube 11   doxycycline (VIBRA-TABS) 100 MG tablet Take 1 tablet (100 mg total) by mouth 2 (two) times daily. 14 tablet 1   enalapril (VASOTEC) 5 MG tablet TAKE 1 TABLET 2 TIMES DAILY 180 tablet 3   hyoscyamine (LEVBID) 0.375 MG 12 hr tablet TAKE ONE TABLET BY MOUTH TWICE A DAY 60 tablet 1   hyoscyamine (LEVSIN SL) 0.125 MG SL tablet DISSOLVE 1-2 TABLETS UNDER THE TONGUE EVERY FOUR HOURS AS NEEDED FOR PAIN 100 tablet 2   ipratropium (ATROVENT) 0.03 % nasal spray ipratropium bromide 21 mcg (0.03 %) nasal spray     levothyroxine (SYNTHROID) 25 MCG tablet TAKE ONE TABLET BY MOUTH EVERY DAY BEFORE BREAKFAST 90 tablet 3   LORazepam (ATIVAN) 0.5 MG tablet Take 1 by mouth every night at bedtime as needed for sleep 30 tablet 3   Lutein-Zeaxanthin 25-5 MG CAPS Take by mouth. Takes one daily     meloxicam (MOBIC) 15 MG tablet TAKE 1 TABLET BY MOUTH EVERY DAY AFTER SURGERY AS NEEDED 90 tablet 2   methocarbamol (ROBAXIN) 500 MG tablet TAKE ONE TABLET BY MOUTH FOUR TIMES DAILY AS NEEDED 120 tablet 0   Multiple Vitamins-Minerals (MULTIVITAMIN,TX-MINERALS) tablet Take 1 tablet by mouth daily.       neomycin-polymyxin-hydrocortisone (CORTISPORIN) OTIC solution Place 3 drops into both ears 4 (four)  times daily. 10 mL 0   NON FORMULARY Vitamin C, Osteo BiFlex, Vitamin B12 - PO daily     Nutritional Supplements (VITAL HIGH PROTEIN PO) Take 2 Scoops by mouth daily.     Omega 3 1000 MG CAPS Take 2 capsules by mouth daily.     pantoprazole (PROTONIX) 40 MG tablet TAKE 1 TABLET BY MOUTH EVERY DAY 90 tablet 3   polyethylene glycol powder (GLYCOLAX/MIRALAX) powder Take 255 g by mouth daily. Mix 17g of Miralax in 8oz of water daily 255 g 3   Sennosides (SENNA LAX PO) Take by mouth. As needed     SUMAtriptan (IMITREX) 100 MG tablet Take 1 tablet (100 mg total) by mouth every 2 (two) hours as needed for migraine. May repeat in 2 hours if headache persists or recurs. 10 tablet 2   traMADol (ULTRAM) 50 MG tablet      triamcinolone cream (KENALOG) 0.1 % APPLY TO AFFECTED AREA UP TO TWICE DAILY AS NEEDED, AVOID FACE/GROIN/UNDERARMS     No facility-administered medications prior to visit.    ROS: Review of Systems  Constitutional:  Negative for activity change, appetite change, chills, fatigue and unexpected weight change.  HENT:  Negative for congestion, mouth sores and sinus pressure.   Eyes:  Negative for visual disturbance.  Respiratory:  Negative for cough and  chest tightness.   Gastrointestinal:  Negative for abdominal pain and nausea.  Genitourinary:  Negative for difficulty urinating, frequency and vaginal pain.  Musculoskeletal:  Positive for arthralgias. Negative for back pain and gait problem.  Skin:  Positive for rash. Negative for pallor.  Neurological:  Negative for dizziness, tremors, weakness, numbness and headaches.  Psychiatric/Behavioral:  Positive for decreased concentration. Negative for confusion and sleep disturbance. The patient is nervous/anxious.    Objective:  BP 138/60 (BP Location: Left Arm)   Pulse 76   Temp 98.6 F (37 C) (Oral)   Ht 5\' 2"  (1.575 m)   Wt 142 lb (64.4 kg)   SpO2 97%   BMI 25.97 kg/m   BP Readings from Last 3 Encounters:  01/14/21 138/60   09/03/20 126/70  09/03/20 126/70    Wt Readings from Last 3 Encounters:  01/14/21 142 lb (64.4 kg)  09/03/20 144 lb 3.2 oz (65.4 kg)  09/03/20 140 lb 12.8 oz (63.9 kg)    Physical Exam Constitutional:      General: She is not in acute distress.    Appearance: She is well-developed.  HENT:     Head: Normocephalic.     Right Ear: External ear normal.     Left Ear: External ear normal.     Nose: Nose normal.  Eyes:     General:        Right eye: No discharge.        Left eye: No discharge.     Conjunctiva/sclera: Conjunctivae normal.     Pupils: Pupils are equal, round, and reactive to light.  Neck:     Thyroid: No thyromegaly.     Vascular: No JVD.     Trachea: No tracheal deviation.  Cardiovascular:     Rate and Rhythm: Normal rate and regular rhythm.     Heart sounds: Normal heart sounds.  Pulmonary:     Effort: No respiratory distress.     Breath sounds: No stridor. No wheezing.  Abdominal:     General: Bowel sounds are normal. There is no distension.     Palpations: Abdomen is soft. There is no mass.     Tenderness: There is no abdominal tenderness. There is no guarding or rebound.  Musculoskeletal:        General: No tenderness.     Cervical back: Normal range of motion and neck supple. No rigidity.  Lymphadenopathy:     Cervical: No cervical adenopathy.  Skin:    Findings: No erythema or rash.  Neurological:     Mental Status: She is oriented to person, place, and time.     Cranial Nerves: No cranial nerve deficit.     Motor: No abnormal muscle tone.     Coordination: Coordination normal.     Deep Tendon Reflexes: Reflexes normal.  Psychiatric:        Behavior: Behavior normal.        Thought Content: Thought content normal.        Judgment: Judgment normal.    Lab Results  Component Value Date   WBC 5.8 09/03/2020   HGB 12.6 09/03/2020   HCT 36.8 09/03/2020   PLT 272.0 09/03/2020   GLUCOSE 94 09/03/2020   CHOL 168 09/03/2020   TRIG 54.0  09/03/2020   HDL 67.10 09/03/2020   LDLCALC 90 09/03/2020   ALT 16 09/03/2020   AST 23 09/03/2020   NA 139 09/03/2020   K 5.3 (H) 09/03/2020   CL 103 09/03/2020  CREATININE 0.82 09/03/2020   BUN 17 09/03/2020   CO2 30 09/03/2020   TSH 3.42 09/03/2020   INR 0.90 08/05/2009   HGBA1C 5.8 05/08/2016    No results found.  Assessment & Plan:     Walker Kehr, MD

## 2021-01-14 NOTE — Assessment & Plan Note (Addendum)
On Prevagen now.  Haley Armstrong can continue with Prevagen.  Lions main mushroom capsules option discussed. I think a lot of her problems are stress related.

## 2021-01-14 NOTE — Assessment & Plan Note (Addendum)
Pt is planning to go to [place] - take ASA for travel Compression socks

## 2021-01-14 NOTE — Patient Instructions (Signed)
You can try [place] [place] Mushroom capsules for memory problems, decreased focus, mental fog, neuropathy ([email or web address])   

## 2021-02-10 ENCOUNTER — Ambulatory Visit: Payer: Medicare Other | Admitting: Podiatry

## 2021-02-10 ENCOUNTER — Other Ambulatory Visit: Payer: Self-pay

## 2021-02-10 DIAGNOSIS — D169 Benign neoplasm of bone and articular cartilage, unspecified: Secondary | ICD-10-CM

## 2021-02-10 DIAGNOSIS — M2041 Other hammer toe(s) (acquired), right foot: Secondary | ICD-10-CM

## 2021-02-10 DIAGNOSIS — L84 Corns and callosities: Secondary | ICD-10-CM

## 2021-02-10 DIAGNOSIS — M779 Enthesopathy, unspecified: Secondary | ICD-10-CM

## 2021-02-10 NOTE — Progress Notes (Signed)
Subjective:   Patient ID: Haley Armstrong, female   DOB: 75 y.o.   MRN: [id number]   HPI Patient presents stating he is doing pretty well with pain but it has improved   ROS      Objective:  Physical Exam  Neurovascular status intact with patient's left fifth MPJ doing well interdigital keratotic lesions improving and generalized structure minimally discomforting      Assessment:  Doing well post procedure for fifth metatarsal with chronic interdigital lesions which are so far under control     Plan:  H&P explained that this will recur again but at this point we will just give and take a wait-and-see attitude with this with the consideration long to return for surgical intervention.  I educated her on surgery discussed different treatment options available for the future and we will just keep an eye on this and decide how it does.  All questions answered today signed visit

## 2021-03-04 ENCOUNTER — Other Ambulatory Visit: Payer: Self-pay | Admitting: Internal Medicine

## 2021-03-20 ENCOUNTER — Other Ambulatory Visit: Payer: Self-pay

## 2021-03-20 ENCOUNTER — Encounter: Payer: Self-pay | Admitting: Podiatry

## 2021-03-20 ENCOUNTER — Ambulatory Visit: Payer: Medicare Other | Admitting: Podiatry

## 2021-03-20 DIAGNOSIS — M778 Other enthesopathies, not elsewhere classified: Secondary | ICD-10-CM | POA: Diagnosis not present

## 2021-03-20 MED ORDER — TRIAMCINOLONE ACETONIDE 10 MG/ML IJ SUSP
10.0000 mg | Freq: Once | INTRAMUSCULAR | Status: AC
Start: 2021-03-20 — End: 2021-03-20
  Administered 2021-03-20: 10 mg

## 2021-03-20 NOTE — Progress Notes (Signed)
Subjective:   Patient ID: Haley Armstrong, female   DOB: 75 y.o.   MRN: [id number]   HPI Patient states my big toe joint left has started to become really tender and I have this problem with the outside of my left foot.  Patient states I may have to have surgery on that someday neuro   ROS      Objective:  Physical Exam  Vascular status intact with the first MPJ left inflamed with reduced range of motion around the joint surface and some redness and irritation of the left fifth metatarsal head     Assessment:  Likes limitus deformity left with inflammatory capsulitis around the joint surface along with probable tailor's bunion deformity irritated by walking differently because of the forefoot pain     Plan:  H&P educated patient on condition sterile prep injected the first MPJ periarticular 3 mg Dexasone Kenalog 5 mg Xylocaine we will see how this does and if 1 point in future may require surgical intervention of conditions signed this signed the

## 2021-04-30 ENCOUNTER — Encounter: Payer: Self-pay | Admitting: Podiatry

## 2021-04-30 ENCOUNTER — Telehealth: Payer: Self-pay | Admitting: Podiatry

## 2021-04-30 NOTE — Telephone Encounter (Signed)
Called patient lvm to reschedule 11/23 appt

## 2021-05-01 ENCOUNTER — Ambulatory Visit: Payer: Medicare Other | Admitting: Podiatry

## 2021-05-04 ENCOUNTER — Other Ambulatory Visit: Payer: Self-pay | Admitting: Internal Medicine

## 2021-05-14 ENCOUNTER — Encounter: Payer: Self-pay | Admitting: Family

## 2021-05-14 ENCOUNTER — Other Ambulatory Visit: Payer: Self-pay

## 2021-05-14 ENCOUNTER — Ambulatory Visit: Payer: Medicare Other | Admitting: Family

## 2021-05-14 VITALS — BP 124/66 | HR 72 | Temp 97.6°F | Wt 141.4 lb

## 2021-05-14 DIAGNOSIS — J0110 Acute frontal sinusitis, unspecified: Secondary | ICD-10-CM

## 2021-05-14 DIAGNOSIS — J3489 Other specified disorders of nose and nasal sinuses: Secondary | ICD-10-CM

## 2021-05-14 LAB — POCT INFLUENZA A/B
Influenza A, POC: NEGATIVE
Influenza B, POC: NEGATIVE

## 2021-05-14 MED ORDER — MUPIROCIN 2 % EX OINT: 1.0000 "application " | TOPICAL_OINTMENT | Freq: Two times a day (BID) | CUTANEOUS | 0 refills | Status: DC

## 2021-05-14 MED ORDER — AMOXICILLIN-POT CLAVULANATE 875-125 MG PO TABS: 1.0000 | ORAL_TABLET | Freq: Two times a day (BID) | ORAL | 0 refills | Status: DC

## 2021-05-14 NOTE — Assessment & Plan Note (Signed)
Sending bactroban ointment to try bid, call her PCP if still not resolving after 1-2 weeks, may need ENT referral.

## 2021-05-14 NOTE — Patient Instructions (Signed)
It was very nice to see you today!  I have sent an antibiotic to treat your sinus infection and an antitbiotic ointment for your nasal sore. Start these today. You can take continue to take Tylenol or Ibuprofen up to 600mg  twice a day for your headache, or the Methocarbamol as needed. If headache is not resolved in 2-3 days call back.   PLEASE NOTE:  If you had any lab tests please let us know if you have not heard back within a few days. You may see your results on MyChart before we have a chance to review them but we will give you a call once they are reviewed by [place]. If we ordered any referrals today, please let us know if you have not heard from their office within the next week.   Please try these tips to maintain a healthy lifestyle:  Eat most of your calories during the day when you are active. Eliminate processed foods including packaged sweets (pies, cakes, cookies), reduce intake of potatoes, white bread, white pasta, and white rice. Look for whole grain options, oat flour or almond flour.  Each meal should contain half fruits/vegetables, one quarter protein, and one quarter carbs (no bigger than a computer mouse).  Cut down on sweet beverages. This includes juice, soda, and sweet tea. Also watch fruit intake, though this is a healthier sweet option, it still contains natural sugar! Limit to 3 servings daily.  Drink at least 1 glass of water with each meal and aim for at least 8 glasses per day  Exercise at least 150 minutes every week.

## 2021-05-14 NOTE — Progress Notes (Signed)
Subjective:     Patient ID: Haley Armstrong, female    DOB: 1945-09-10, 75 y.o.   MRN: [id number]  Chief Complaint  Patient presents with   Headache    Ongoing for a week, across whole forehead    HPI Sinus Pain: Patient complains of headache described as forehead pain, nasal congestion, purulent nasal discharge, and sinus pressure. Onset of symptoms was 2 weeks ago, with gradually worsening headache since that time. She is drinking moderate amounts of fluids.  Past history is significant for no history of pneumonia or bronchitis. Patient is non-smoker.  Nasal sore: pt reports having a sore for a long time in upper left nasal passage. Reports tenderness at times, non-bleeding, has not grown in size. She has been applying neosporin ointment, but it is not going away.  Health Maintenance Due  Topic Date Due   MAMMOGRAM  10/28/2015   COVID-19 Vaccine (5 - Booster for [employer] series) 03/13/2021    Past Medical History:  Diagnosis Date   Anemia, iron deficiency    Anxiety    Blood donor    Family history of coronary artery disease 06/04/2016   GERD (gastroesophageal reflux disease)    Hypertension    IBS (irritable bowel syndrome)    Osteoarthritis     Past Surgical History:  Procedure Laterality Date   APPENDECTOMY     CERVICAL FUSION  2011   C4-5 Dr Marcial Pacas   CHOLECYSTECTOMY     ESOPHAGEAL MANOMETRY N/A 11/13/2013   Procedure: ESOPHAGEAL MANOMETRY (EM);  Surgeon: Irene Shipper, MD;  Location: [facility];  Service: Endoscopy;  Laterality: N/A;    Outpatient Medications Prior to Visit  Medication Sig Dispense Refill   amLODipine (NORVASC) 2.5 MG tablet Take 1 tablet (2.5 mg total) by mouth daily. 90 tablet 3   Apoaequorin (PREVAGEN) 10 MG CAPS Take by mouth.     Cholecalciferol (VITAMIN D3) 50 MCG (2000 UT) capsule Take 1 capsule (2,000 Units total) by mouth daily. 100 capsule 3   diclofenac sodium (VOLTAREN) 1 % GEL Apply 4 g topically 4 (four) times daily. 100 Tube 11    enalapril (VASOTEC) 5 MG tablet TAKE 1 TABLET 2 TIMES DAILY 180 tablet 3   hyoscyamine (LEVBID) 0.375 MG 12 hr tablet TAKE ONE TABLET BY MOUTH TWICE A DAY 60 tablet 1   hyoscyamine (LEVSIN SL) 0.125 MG SL tablet DISSOLVE 1-2 TABLETS UNDER THE TONGUE EVERY FOUR HOURS AS NEEDED FOR PAIN 100 tablet 2   ipratropium (ATROVENT) 0.03 % nasal spray ipratropium bromide 21 mcg (0.03 %) nasal spray     levothyroxine (SYNTHROID) 25 MCG tablet TAKE ONE TABLET BY MOUTH EVERY DAY BEFORE BREAKFAST 90 tablet 3   LORazepam (ATIVAN) 0.5 MG tablet Take 1 by mouth every night at bedtime as needed for sleep 30 tablet 3   Lutein-Zeaxanthin 25-5 MG CAPS Take by mouth. Takes one daily     meloxicam (MOBIC) 15 MG tablet TAKE 1 TABLET BY MOUTH EVERY DAY AFTER SURGERY AS NEEDED (Patient taking differently: TAKE 1 TABLET BY MOUTH EVERY DAY) 90 tablet 2   methocarbamol (ROBAXIN) 500 MG tablet TAKE ONE TABLET BY MOUTH FOUR TIMES DAILY AS NEEDED 120 tablet 0   Multiple Vitamins-Minerals (MULTIVITAMIN,TX-MINERALS) tablet Take 1 tablet by mouth daily.       NON FORMULARY Vitamin C, Osteo BiFlex, Vitamin B12 - PO daily     Nutritional Supplements (VITAL HIGH PROTEIN PO) Take 2 Scoops by mouth daily.     Omega 3  1000 MG CAPS Take 2 capsules by mouth daily.     pantoprazole (PROTONIX) 40 MG tablet TAKE 1 TABLET BY MOUTH EVERY DAY 90 tablet 3   SUMAtriptan (IMITREX) 100 MG tablet Take 1 tablet (100 mg total) by mouth every 2 (two) hours as needed for migraine. May repeat in 2 hours if headache persists or recurs. 10 tablet 2   doxycycline (VIBRA-TABS) 100 MG tablet Take 1 tablet (100 mg total) by mouth 2 (two) times daily. (Patient not taking: Reported on 05/14/2021) 14 tablet 1   methylPREDNISolone (MEDROL DOSEPAK) 4 MG TBPK tablet As directed (Patient not taking: Reported on 05/14/2021) 21 tablet 0   neomycin-polymyxin-hydrocortisone (CORTISPORIN) OTIC solution Place 3 drops into both ears 4 (four) times daily. (Patient not taking:  Reported on 05/14/2021) 10 mL 0   polyethylene glycol powder (GLYCOLAX/MIRALAX) powder Take 255 g by mouth daily. Mix 17g of Miralax in 8oz of water daily (Patient not taking: Reported on 05/14/2021) 255 g 3   Sennosides (SENNA LAX PO) Take by mouth. As needed (Patient not taking: Reported on 05/14/2021)     traMADol (ULTRAM) 50 MG tablet  (Patient not taking: Reported on 05/14/2021)     triamcinolone cream (KENALOG) 0.1 % APPLY TO AFFECTED AREA UP TO TWICE DAILY AS NEEDED, AVOID FACE/GROIN/UNDERARMS (Patient not taking: Reported on 05/14/2021)     No facility-administered medications prior to visit.    No Known Allergies      Objective:    Physical Exam Vitals and nursing note reviewed.  Constitutional:      Appearance: Normal appearance.  HENT:     Right Ear: Tympanic membrane and ear canal normal.     Left Ear: Tympanic membrane and ear canal normal.     Nose:     Right Sinus: Frontal sinus tenderness present.     Left Sinus: Frontal sinus tenderness present.     Comments: Unable to visualize sore in left upper outer nasal passage.    Mouth/Throat:     Pharynx: No pharyngeal swelling or posterior oropharyngeal erythema.     Tonsils: No tonsillar exudate or tonsillar abscesses.  Cardiovascular:     Rate and Rhythm: Normal rate and regular rhythm.  Pulmonary:     Effort: Pulmonary effort is normal.     Breath sounds: Normal breath sounds.  Musculoskeletal:        General: Normal range of motion.  Skin:    General: Skin is warm and dry.  Neurological:     Mental Status: She is alert.  Psychiatric:        Mood and Affect: Mood normal.        Behavior: Behavior normal.    BP 124/66   Pulse 72   Temp 97.6 F (36.4 C) (Temporal)   Wt 141 lb 6.4 oz (64.1 kg)   SpO2 98%   BMI 25.86 kg/m  Wt Readings from Last 3 Encounters:  05/14/21 141 lb 6.4 oz (64.1 kg)  01/14/21 142 lb (64.4 kg)  09/03/20 144 lb 3.2 oz (65.4 kg)       Assessment & Plan:   Problem List Items  Addressed This Visit       Respiratory   Acute non-recurrent frontal sinusitis - Primary    Reports having sinus drainage starting about 2 weeks ago, then developed an intractable headache with sinus pain and pressure, severe when bending over. She has taken tylenol sinus OTC,  Imitrex and Methocarbamol which eases but does not get rid of pain.  Advised ok to continue tylenol sinus, Methocarbamol prn, and can add Ibuprofen up to 600mg  bid for pain until Augmentin kicks in, if headache persists past 2 or 3 days, call the office.      Relevant Medications   amoxicillin-clavulanate (AUGMENTIN) 875-125 MG tablet   Other Relevant Orders   POCT Influenza A/B (Completed)     Other   Nasal sore    Sending bactroban ointment to try bid, call her PCP if still not resolving after 1-2 weeks, may need ENT referral.      Relevant Medications   mupirocin ointment (BACTROBAN) 2 %    Meds ordered this encounter  Medications   amoxicillin-clavulanate (AUGMENTIN) 875-125 MG tablet    Sig: Take 1 tablet by mouth 2 (two) times daily after a meal.    Dispense:  10 tablet    Refill:  0    Order Specific Question:   Supervising Provider    Answer:   ANDY, CAMILLE L [[id number]]   mupirocin ointment (BACTROBAN) 2 %    Sig: Place 1 application into the nose 2 (two) times daily.    Dispense:  22 g    Refill:  0    Order Specific Question:   Supervising Provider    Answer:   ANDY, CAMILLE L [[id number]]

## 2021-05-14 NOTE — Assessment & Plan Note (Signed)
Reports having sinus drainage starting about 2 weeks ago, then developed an intractable headache with sinus pain and pressure, severe when bending over. She has taken tylenol sinus OTC,  Imitrex and Methocarbamol which eases but does not get rid of pain. Advised ok to continue tylenol sinus, Methocarbamol prn, and can add Ibuprofen up to 600mg  bid for pain until Augmentin kicks in, if headache persists past 2 or 3 days, call the office.

## 2021-05-21 ENCOUNTER — Ambulatory Visit: Payer: Medicare Other | Admitting: Podiatry

## 2021-07-18 ENCOUNTER — Encounter: Payer: Self-pay | Admitting: Internal Medicine

## 2021-07-19 ENCOUNTER — Other Ambulatory Visit: Payer: Self-pay | Admitting: Internal Medicine

## 2021-07-19 DIAGNOSIS — M542 Cervicalgia: Secondary | ICD-10-CM

## 2021-07-19 NOTE — Progress Notes (Signed)
Neurosurgery referral.

## 2021-07-23 DIAGNOSIS — Z1231 Encounter for screening mammogram for malignant neoplasm of breast: Secondary | ICD-10-CM | POA: Diagnosis not present

## 2021-07-29 ENCOUNTER — Encounter: Payer: Self-pay | Admitting: Internal Medicine

## 2021-07-29 ENCOUNTER — Other Ambulatory Visit: Payer: Self-pay

## 2021-07-29 ENCOUNTER — Ambulatory Visit (INDEPENDENT_AMBULATORY_CARE_PROVIDER_SITE_OTHER): Payer: Medicare Other | Admitting: Internal Medicine

## 2021-07-29 VITALS — BP 138/70 | HR 70 | Temp 97.9°F | Ht 62.0 in | Wt 141.8 lb

## 2021-07-29 DIAGNOSIS — F329 Major depressive disorder, single episode, unspecified: Secondary | ICD-10-CM | POA: Diagnosis not present

## 2021-07-29 DIAGNOSIS — F413 Other mixed anxiety disorders: Secondary | ICD-10-CM | POA: Diagnosis not present

## 2021-07-29 DIAGNOSIS — M542 Cervicalgia: Secondary | ICD-10-CM

## 2021-07-29 DIAGNOSIS — I10 Essential (primary) hypertension: Secondary | ICD-10-CM

## 2021-07-29 DIAGNOSIS — N3281 Overactive bladder: Secondary | ICD-10-CM | POA: Diagnosis not present

## 2021-07-29 LAB — URINALYSIS, ROUTINE W REFLEX MICROSCOPIC
Bilirubin Urine: NEGATIVE
Hgb urine dipstick: NEGATIVE
Ketones, ur: NEGATIVE
Nitrite: NEGATIVE
RBC / HPF: NONE SEEN (ref 0–?)
Specific Gravity, Urine: <=1.005 — AB (ref 1.000–1.030)
Total Protein, Urine: NEGATIVE
Urine Glucose: NEGATIVE
Urobilinogen, UA: 0.2 (ref 0.0–1.0)
pH: 6 (ref 5.0–8.0)

## 2021-07-29 LAB — CBC WITH DIFFERENTIAL/PLATELET
Basophils Absolute: 0.1 10*3/uL (ref 0.0–0.1)
Basophils Relative: 0.7 % (ref 0.0–3.0)
Eosinophils Absolute: 0.2 10*3/uL (ref 0.0–0.7)
Eosinophils Relative: 2.9 % (ref 0.0–5.0)
HCT: 38.8 % (ref 36.0–46.0)
Hemoglobin: 12.7 g/dL (ref 12.0–15.0)
Lymphocytes Relative: 24.3 % (ref 12.0–46.0)
Lymphs Abs: 2 10*3/uL (ref 0.7–4.0)
MCHC: 32.8 g/dL (ref 30.0–36.0)
MCV: 94.9 fl (ref 78.0–100.0)
Monocytes Absolute: 0.7 10*3/uL (ref 0.1–1.0)
Monocytes Relative: 8 % (ref 3.0–12.0)
Neutro Abs: 5.3 10*3/uL (ref 1.4–7.7)
Neutrophils Relative %: 64.1 % (ref 43.0–77.0)
Platelets: 265 10*3/uL (ref 150.0–400.0)
RBC: 4.09 Mil/uL (ref 3.87–5.11)
RDW: 13.3 % (ref 11.5–15.5)
WBC: 8.2 10*3/uL (ref 4.0–10.5)

## 2021-07-29 LAB — COMPREHENSIVE METABOLIC PANEL
ALT: 14 U/L (ref 0–35)
AST: 21 U/L (ref 0–37)
Albumin: 4 g/dL (ref 3.5–5.2)
Alkaline Phosphatase: 56 U/L (ref 39–117)
BUN: 21 mg/dL (ref 6–23)
CO2: 32 mEq/L (ref 19–32)
Calcium: 9 mg/dL (ref 8.4–10.5)
Chloride: 103 mEq/L (ref 96–112)
Creatinine, Ser: 0.95 mg/dL (ref 0.40–1.20)
GFR: 58.95 mL/min — ABNORMAL LOW (ref >60.00)
Glucose, Bld: 99 mg/dL (ref 70–99)
Potassium: 4.3 mEq/L (ref 3.5–5.1)
Sodium: 140 mEq/L (ref 135–145)
Total Bilirubin: 0.3 mg/dL (ref 0.2–1.2)
Total Protein: 6.5 g/dL (ref 6.0–8.3)

## 2021-07-29 MED ORDER — BUPROPION HCL ER (XL) 150 MG PO TB24: 150.0000 mg | ORAL_TABLET | Freq: Every day | ORAL | 3 refills | Status: DC

## 2021-07-29 MED ORDER — METHOCARBAMOL 500 MG PO TABS: ORAL_TABLET | ORAL | 0 refills | Status: DC
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
## 2021-07-29 NOTE — Progress Notes (Signed)
Subjective:  Patient ID: Haley Armstrong, female    DOB: 28-Sep-1945  Age: 76 y.o. MRN: [id number]  CC: Follow-up (6 month f/u)   HPI Haley Armstrong presents for cervical grinding sound on ROM, popping.   F/u HTN, stress - husband broke 2 hands; anxiety. C/o depression  Outpatient Medications Prior to Visit  Medication Sig Dispense Refill   amLODipine (NORVASC) 2.5 MG tablet Take 1 tablet (2.5 mg total) by mouth daily. 90 tablet 3   Apoaequorin (PREVAGEN) 10 MG CAPS Take by mouth.     Cholecalciferol (VITAMIN D3) 50 MCG (2000 UT) capsule Take 1 capsule (2,000 Units total) by mouth daily. 100 capsule 3   diclofenac sodium (VOLTAREN) 1 % GEL Apply 4 g topically 4 (four) times daily. 100 Tube 11   enalapril (VASOTEC) 5 MG tablet TAKE 1 TABLET 2 TIMES DAILY 180 tablet 3   hyoscyamine (LEVBID) 0.375 MG 12 hr tablet TAKE ONE TABLET BY MOUTH TWICE A DAY 60 tablet 1   hyoscyamine (LEVSIN SL) 0.125 MG SL tablet DISSOLVE 1-2 TABLETS UNDER THE TONGUE EVERY FOUR HOURS AS NEEDED FOR PAIN 100 tablet 2   ipratropium (ATROVENT) 0.03 % nasal spray ipratropium bromide 21 mcg (0.03 %) nasal spray     levothyroxine (SYNTHROID) 25 MCG tablet TAKE ONE TABLET BY MOUTH EVERY DAY BEFORE BREAKFAST 90 tablet 3   LORazepam (ATIVAN) 0.5 MG tablet Take 1 by mouth every night at bedtime as needed for sleep 30 tablet 3   Lutein-Zeaxanthin 25-5 MG CAPS Take by mouth. Takes one daily     meloxicam (MOBIC) 15 MG tablet TAKE 1 TABLET BY MOUTH EVERY DAY AFTER SURGERY AS NEEDED (Patient taking differently: TAKE 1 TABLET BY MOUTH EVERY DAY) 90 tablet 2   Multiple Vitamins-Minerals (MULTIVITAMIN,TX-MINERALS) tablet Take 1 tablet by mouth daily.       NON FORMULARY Vitamin C, Osteo BiFlex, Vitamin B12 - PO daily     Nutritional Supplements (VITAL HIGH PROTEIN PO) Take 2 Scoops by mouth daily.     Omega 3 1000 MG CAPS Take 2 capsules by mouth daily.     pantoprazole (PROTONIX) 40 MG tablet TAKE 1 TABLET BY MOUTH EVERY DAY 90  tablet 3   polyethylene glycol powder (GLYCOLAX/MIRALAX) powder Take 255 g by mouth daily. Mix 17g of Miralax in 8oz of water daily 255 g 3   Sennosides (SENNA LAX PO) Take by mouth. As needed     SUMAtriptan (IMITREX) 100 MG tablet Take 1 tablet (100 mg total) by mouth every 2 (two) hours as needed for migraine. May repeat in 2 hours if headache persists or recurs. 10 tablet 2   traMADol (ULTRAM) 50 MG tablet      triamcinolone cream (KENALOG) 0.1 %      methocarbamol (ROBAXIN) 500 MG tablet TAKE ONE TABLET BY MOUTH FOUR TIMES DAILY AS NEEDED 120 tablet 0   amoxicillin-clavulanate (AUGMENTIN) 875-125 MG tablet Take 1 tablet by mouth 2 (two) times daily after a meal. (Patient not taking: Reported on 07/29/2021) 10 tablet 0   doxycycline (VIBRA-TABS) 100 MG tablet Take 1 tablet (100 mg total) by mouth 2 (two) times daily. (Patient not taking: Reported on 05/14/2021) 14 tablet 1   methylPREDNISolone (MEDROL DOSEPAK) 4 MG TBPK tablet As directed (Patient not taking: Reported on 07/29/2021) 21 tablet 0   mupirocin ointment (BACTROBAN) 2 % Place 1 application into the nose 2 (two) times daily. (Patient not taking: Reported on 07/29/2021) 22 g 0   neomycin-polymyxin-hydrocortisone (  CORTISPORIN) OTIC solution Place 3 drops into both ears 4 (four) times daily. (Patient not taking: Reported on 05/14/2021) 10 mL 0   No facility-administered medications prior to visit.    ROS: Review of Systems  Constitutional:  Positive for fatigue. Negative for activity change, appetite change, chills and unexpected weight change.  HENT:  Negative for congestion, mouth sores and sinus pressure.   Eyes:  Negative for visual disturbance.  Respiratory:  Negative for cough and chest tightness.   Cardiovascular:  Negative for palpitations.  Gastrointestinal:  Negative for abdominal pain and nausea.  Genitourinary:  Negative for difficulty urinating, frequency and vaginal pain.  Musculoskeletal:  Positive for arthralgias and  neck stiffness. Negative for back pain and gait problem.  Skin:  Negative for pallor and rash.  Neurological:  Negative for dizziness, tremors, weakness, numbness and headaches.  Psychiatric/Behavioral:  Positive for decreased concentration. Negative for confusion, sleep disturbance and suicidal ideas. The patient is nervous/anxious.    Objective:  BP 138/70 (BP Location: Left Arm)    Pulse 70    Temp 97.9 F (36.6 C) (Oral)    Ht 5\' 2"  (1.575 m)    Wt 141 lb 12.8 oz (64.3 kg)    SpO2 97%    BMI 25.94 kg/m   BP Readings from Last 3 Encounters:  07/29/21 138/70  05/14/21 124/66  01/14/21 138/60    Wt Readings from Last 3 Encounters:  07/29/21 141 lb 12.8 oz (64.3 kg)  05/14/21 141 lb 6.4 oz (64.1 kg)  01/14/21 142 lb (64.4 kg)    Physical Exam Constitutional:      General: She is not in acute distress.    Appearance: She is well-developed.  HENT:     Head: Normocephalic.     Right Ear: External ear normal.     Left Ear: External ear normal.     Nose: Nose normal.  Eyes:     General:        Right eye: No discharge.        Left eye: No discharge.     Conjunctiva/sclera: Conjunctivae normal.     Pupils: Pupils are equal, round, and reactive to light.  Neck:     Thyroid: No thyromegaly.     Vascular: No JVD.     Trachea: No tracheal deviation.  Cardiovascular:     Rate and Rhythm: Normal rate and regular rhythm.     Heart sounds: Normal heart sounds.  Pulmonary:     Effort: No respiratory distress.     Breath sounds: No stridor. No wheezing.  Abdominal:     General: Bowel sounds are normal. There is no distension.     Palpations: Abdomen is soft. There is no mass.     Tenderness: There is no abdominal tenderness. There is no guarding or rebound.  Musculoskeletal:        General: No tenderness.     Cervical back: Normal range of motion and neck supple. No rigidity.  Lymphadenopathy:     Cervical: No cervical adenopathy.  Skin:    Findings: No erythema or rash.   Neurological:     Cranial Nerves: No cranial nerve deficit.     Motor: No abnormal muscle tone.     Coordination: Coordination normal.     Gait: Gait normal.     Deep Tendon Reflexes: Reflexes normal.  Psychiatric:        Behavior: Behavior normal.        Thought Content: Thought content normal.  Judgment: Judgment normal.    Lab Results  Component Value Date   WBC 5.8 09/03/2020   HGB 12.6 09/03/2020   HCT 36.8 09/03/2020   PLT 272.0 09/03/2020   GLUCOSE 94 09/03/2020   CHOL 168 09/03/2020   TRIG 54.0 09/03/2020   HDL 67.10 09/03/2020   LDLCALC 90 09/03/2020   ALT 16 09/03/2020   AST 23 09/03/2020   NA 139 09/03/2020   K 5.3 (H) 09/03/2020   CL 103 09/03/2020   CREATININE 0.82 09/03/2020   BUN 17 09/03/2020   CO2 30 09/03/2020   TSH 3.42 09/03/2020   INR 0.90 08/05/2009   HGBA1C 5.8 05/08/2016    No results found.  Assessment & Plan:   Problem List Items Addressed This Visit     Anxiety disorder    Worse - husband broke 2 hands Stress discussed      Relevant Medications   buPROPion (WELLBUTRIN XL) 150 MG 24 hr tablet   Other Relevant Orders   TSH   Urinalysis   CBC with Differential/Platelet   Comprehensive metabolic panel   Cervicalgia - Primary    Worsening cervical grinding sound on ROM, popping.  NS referral. Methocarbamol prn. Stress discussed      Essential hypertension   Relevant Orders   TSH   Urinalysis   CBC with Differential/Platelet   Comprehensive metabolic panel   OAB (overactive bladder)   Reactive depression (situational)    Worse - husband broke 2 hands Stress discussed Start Wellbutrin XL 150 mg/d      Relevant Medications   buPROPion (WELLBUTRIN XL) 150 MG 24 hr tablet   Other Relevant Orders   TSH   Urinalysis   CBC with Differential/Platelet   Comprehensive metabolic panel      Meds ordered this encounter  Medications   buPROPion (WELLBUTRIN XL) 150 MG 24 hr tablet    Sig: Take 1 tablet (150 mg total)  by mouth daily. Take in am    Dispense:  30 tablet    Refill:  3   methocarbamol (ROBAXIN) 500 MG tablet    Sig: TAKE ONE TABLET BY MOUTH FOUR TIMES DAILY AS NEEDED    Dispense:  120 tablet    Refill:  0      Follow-up: Return in about 2 months (around 09/26/2021) for a follow-up visit.  Walker Kehr, MD

## 2021-07-29 NOTE — Assessment & Plan Note (Signed)
Worsening cervical grinding sound on ROM, popping.  NS referral. Methocarbamol prn. Stress discussed

## 2021-07-30 ENCOUNTER — Other Ambulatory Visit: Payer: Self-pay | Admitting: Internal Medicine

## 2021-07-30 LAB — TSH: TSH: 3.27 u[IU]/mL (ref 0.35–5.50)

## 2021-07-30 MED ORDER — CEFUROXIME AXETIL 250 MG PO TABS: 250.0000 mg | ORAL_TABLET | Freq: Two times a day (BID) | ORAL | 0 refills | Status: DC

## 2021-08-12 ENCOUNTER — Other Ambulatory Visit: Payer: Self-pay | Admitting: Internal Medicine

## 2021-08-16 ENCOUNTER — Other Ambulatory Visit: Payer: Self-pay | Admitting: Internal Medicine

## 2021-08-21 ENCOUNTER — Other Ambulatory Visit: Payer: Self-pay | Admitting: Internal Medicine

## 2021-08-26 ENCOUNTER — Telehealth: Payer: Self-pay | Admitting: Internal Medicine

## 2021-08-26 NOTE — Telephone Encounter (Signed)
Left message for patient to call back to schedule Medicare Annual Wellness Visit   Last AWV  09/03/20  Please schedule at anytime with [facility] if patient calls the office back.    40 Minutes appointment   Any questions, please call me at [phone]

## 2021-09-03 ENCOUNTER — Encounter: Payer: Self-pay | Admitting: Internal Medicine

## 2021-09-04 ENCOUNTER — Encounter: Payer: Self-pay | Admitting: Family Medicine

## 2021-09-04 ENCOUNTER — Telehealth (INDEPENDENT_AMBULATORY_CARE_PROVIDER_SITE_OTHER): Payer: Medicare Other | Admitting: Family Medicine

## 2021-09-04 VITALS — BP 104/61 | HR 86 | Wt 139.6 lb

## 2021-09-04 DIAGNOSIS — U071 COVID-19: Secondary | ICD-10-CM | POA: Diagnosis not present

## 2021-09-04 MED ORDER — NIRMATRELVIR/RITONAVIR (PAXLOVID) TABLET (RENAL DOSING): 2.0000 | ORAL_TABLET | Freq: Two times a day (BID) | ORAL | 0 refills | Status: AC

## 2021-09-04 MED ORDER — BENZONATATE 100 MG PO CAPS: ORAL_CAPSULE | ORAL | 0 refills | Status: DC

## 2021-09-04 NOTE — Progress Notes (Signed)
Virtual Visit via Video Note ? ?I connected with Haley Armstrong ? on 09/04/21 at 10:00 AM EST by a video enabled telemedicine application and verified that I am speaking with the correct person using two identifiers. ? Location patient: [place] ?Location provider:work or home office ?Persons participating in the virtual visit: patient, provider ? ?I discussed the limitations and requested verbal permission for telemedicine visit. The patient expressed understanding and agreed to proceed. ? ? ?HPI: ? ?Acute telemedicine visit for Covid: ?-Onset:2 -4 days ago ?-Symptoms include: cough, nasal congestion, chills, sore throat, diarrhea, nausea ?-Denies: fever, CP, SOB, diarrhea, vomiting ?-GFR 58 recently ?-Pertinent past medical history: see below ?-does not take ativan or tramadol on a regular basis - reports has not taken in some time ?-Pertinent medication allergies:No Known Allergies ?-COVID-19 vaccine status: 2 doses and 2 boosters ?Immunization History  ?Administered Date(s) Administered  ? Fluad Quad(high Dose 65+) 04/13/2019, 04/26/2020, 05/03/2021  ? Influenza Split 03/16/2011  ? Influenza Whole 05/24/2007, 03/21/2008, 05/29/2009, 05/06/2010  ? Influenza, High Dose Seasonal PF 04/26/2017  ? Influenza,inj,Quad PF,6+ Mos 03/25/2013, 03/21/2014, 04/18/2015, 03/05/2016  ? PFIZER Comirnaty(Gray Top)Covid-19 Tri-Sucrose Vaccine 01/16/2021  ? PFIZER(Purple Top)SARS-COV-2 Vaccination 08/12/2019, 09/06/2019, 05/31/2020  ? Pneumococcal Conjugate-13 04/18/2015  ? Pneumococcal Polysaccharide-23 06/07/2013  ? Td 04/02/2004  ? Tdap 03/25/2013  ? Zoster Recombinat (Shingrix) 11/09/2017, 01/14/2018  ? Zoster, Live 11/09/2007  ? ? ? ?ROS: See pertinent positives and negatives per HPI. ? ?Past Medical History:  ?Diagnosis Date  ? Anemia, iron deficiency   ? Anxiety   ? Blood donor   ? Family history of coronary artery disease 06/04/2016  ? GERD (gastroesophageal reflux disease)   ? Hypertension   ? IBS (irritable bowel syndrome)   ?  Osteoarthritis   ? ? ?Past Surgical History:  ?Procedure Laterality Date  ? APPENDECTOMY    ? CERVICAL FUSION  2011  ? C4-5 Dr Marcial Pacas  ? CHOLECYSTECTOMY    ? ESOPHAGEAL MANOMETRY N/A 11/13/2013  ? Procedure: ESOPHAGEAL MANOMETRY (EM);  Surgeon: Irene Shipper, MD;  Location: [facility];  Service: Endoscopy;  Laterality: N/A;  ? ? ? ?Current Outpatient Medications:  ?  amLODipine (NORVASC) 2.5 MG tablet, Take 1 tablet (2.5 mg total) by mouth daily., Disp: 90 tablet, Rfl: 3 ?  Apoaequorin (PREVAGEN) 10 MG CAPS, Take by mouth., Disp: , Rfl:  ?  benzonatate (TESSALON PERLES) 100 MG capsule, 1-2 capsules up to twice daily as needed for cough, Disp: 30 capsule, Rfl: 0 ?  buPROPion (WELLBUTRIN XL) 150 MG 24 hr tablet, TAKE 1 TABLET (150 MG TOTAL) BY MOUTH DAILY. TAKE IN AM, Disp: 90 tablet, Rfl: 2 ?  Cholecalciferol (VITAMIN D3) 50 MCG (2000 UT) capsule, Take 1 capsule (2,000 Units total) by mouth daily., Disp: 100 capsule, Rfl: 3 ?  diclofenac sodium (VOLTAREN) 1 % GEL, Apply 4 g topically 4 (four) times daily., Disp: 100 Tube, Rfl: 11 ?  enalapril (VASOTEC) 5 MG tablet, TAKE 1 TABLET 2 TIMES DAILY, Disp: 180 tablet, Rfl: 3 ?  hyoscyamine (LEVBID) 0.375 MG 12 hr tablet, TAKE ONE TABLET BY MOUTH TWICE A DAY, Disp: 60 tablet, Rfl: 1 ?  hyoscyamine (LEVSIN SL) 0.125 MG SL tablet, DISSOLVE 1-2 TABLETS UNDER THE TONGUE EVERY FOUR HOURS AS NEEDED FOR PAIN, Disp: 100 tablet, Rfl: 2 ?  ipratropium (ATROVENT) 0.03 % nasal spray, ipratropium bromide 21 mcg (0.03 %) nasal spray, Disp: , Rfl:  ?  levothyroxine (SYNTHROID) 25 MCG tablet, TAKE ONE TABLET BY MOUTH EVERY DAY BEFORE BREAKFAST, Disp:  90 tablet, Rfl: 3 ?  LORazepam (ATIVAN) 0.5 MG tablet, Take 1 by mouth every night at bedtime as needed for sleep, Disp: 30 tablet, Rfl: 3 ?  Lutein-Zeaxanthin 25-5 MG CAPS, Take by mouth. Takes one daily, Disp: , Rfl:  ?  meloxicam (MOBIC) 15 MG tablet, TAKE 1 TABLET BY MOUTH EVERY DAY AFTER SURGERY AS NEEDED (Patient taking differently: TAKE  0.5 TABLET BY MOUTH BID), Disp: 90 tablet, Rfl: 2 ?  methocarbamol (ROBAXIN) 500 MG tablet, TAKE ONE TABLET BY MOUTH FOUR TIMES DAILY AS NEEDED, Disp: 120 tablet, Rfl: 0 ?  Multiple Vitamins-Minerals (MULTIVITAMIN,TX-MINERALS) tablet, Take 1 tablet by mouth daily.  , Disp: , Rfl:  ?  nirmatrelvir/ritonavir EUA, renal dosing, (PAXLOVID) 10 x 150 MG & 10 x '100MG'$  TABS, Take 2 tablets by mouth 2 (two) times daily for 5 days. (Take nirmatrelvir 150 mg one tablet twice daily for 5 days and ritonavir 100 mg one tablet twice daily for 5 days) Patient GFR is 56, Disp: 20 tablet, Rfl: 0 ?  NON FORMULARY, Vitamin C, Osteo BiFlex, Vitamin B12 - PO daily, Disp: , Rfl:  ?  Omega 3 1000 MG CAPS, Take 2 capsules by mouth daily., Disp: , Rfl:  ?  pantoprazole (PROTONIX) 40 MG tablet, TAKE 1 TABLET BY MOUTH EVERY DAY, Disp: 90 tablet, Rfl: 3 ?  SUMAtriptan (IMITREX) 100 MG tablet, Take 1 tablet (100 mg total) by mouth every 2 (two) hours as needed for migraine. May repeat in 2 hours if headache persists or recurs., Disp: 10 tablet, Rfl: 2 ?  traMADol (ULTRAM) 50 MG tablet, , Disp: , Rfl:  ?  triamcinolone cream (KENALOG) 0.1 %, , Disp: , Rfl:  ? ?EXAM: ? ?VITALS per patient if applicable: ? ?GENERAL: alert, oriented, appears well and in no acute distress ? ?HEENT: atraumatic, conjunttiva clear, no obvious abnormalities on inspection of external nose and ears ? ?NECK: normal movements of the head and neck ? ?LUNGS: on inspection no signs of respiratory distress, breathing rate appears normal, no obvious gross SOB, gasping or wheezing ? ?CV: no obvious cyanosis ? ?MS: moves all visible extremities without noticeable abnormality ? ?PSYCH/NEURO: pleasant and cooperative, no obvious depression or anxiety, speech and thought processing grossly intact ? ?ASSESSMENT AND PLAN: ? ?Discussed the following assessment and plan: ? ?COVID-19 ? ? Discussed treatment options and risk of drug interactions, ideal treatment window, potential  complications, isolation and precautions for COVID-19.  Discussed possibility of rebound with antivirals and the need to reisolate if it should occur for 5 days. Checked for/reviewed any labs done in the last 90 days with GFR listed in HPI if available.  ?After lengthy discussion, the patient opted for treatment with Paxlovid due to being higher risk for complications of covid or severe disease and other factors. Discussed EUA status of this drug and the fact that there is preliminary limited knowledge of risks/interactions/side effects per EUA document vs possible benefits and precautions. This information was shared with patient during the visit and also was provided in patient instructions.  ?The patient did want a prescription for cough, Tessalon Rx sent.  Other symptomatic care measures summarized in patient instructions. ?Work/School slipped offered: provided in patient instructions   ?Advised to seek prompt virtual visit or in person care if worsening, new symptoms arise, or if is not improving with treatment as expected per our conversation of expected course. Discussed options for follow up care. Did let this patient know that I do telemedicine on Tuesdays and  Thursdays for [place] and those are the days I am logged into the system. Advised to schedule follow up visit with PCP, [place] virtual visits or [facility] if any further questions or concerns to avoid delays in care. ?  ?I discussed the assessment and treatment plan with the patient. The patient was provided an opportunity to ask questions and all were answered. The patient agreed with the plan and demonstrated an understanding of the instructions. ?  ? ? ?Lucretia Kern, DO  ? ?

## 2021-09-04 NOTE — Patient Instructions (Addendum)
---------------------------------------------------------------------------------------------------------------------------    WORK SLIP:  Patient Haley Armstrong,  1946/06/08, was seen for a medical visit today, 09/04/21 . Please excuse from work for a COVID  illness. The patient will likely be contagious for 7-10 days on average.  Will defer to employer for a sooner return to work if patient has 2 negative covid tests 48 hours apart and is feeling better, or if symptoms have resolved OR it is greater than 5 days since the positive test and the patient can wear a high-quality, tight fitting mask such as N95 or KN95 at all times for an additional 5 days.   Sincerely: E-signature: Dr. Colin Benton, DO [facility] Ph: [phone]   ------------------------------------------------------------------------------------------------------------------------------   HOME CARE TIPS:   -I sent the medication(s) we discussed to your pharmacy: Meds ordered this encounter  Medications   nirmatrelvir/ritonavir EUA, renal dosing, (PAXLOVID) 10 x 150 MG & 10 x 100MG TABS    Sig: Take 2 tablets by mouth 2 (two) times daily for 5 days. (Take nirmatrelvir 150 mg one tablet twice daily for 5 days and ritonavir 100 mg one tablet twice daily for 5 days) Patient GFR is 56    Dispense:  20 tablet    Refill:  0   benzonatate (TESSALON PERLES) 100 MG capsule    Sig: 1-2 capsules up to twice daily as needed for cough    Dispense:  30 capsule    Refill:  0     -I sent in the [facility] treatment or referral you requested per our discussion. Please see the information provided below and discuss further with the pharmacist/treatment team.  -If taking Paxlovid, please review all medications, supplement and over the counter drugs with your pharmacist and ask them to check for any interactions. Please make the following changes to your regular medications while taking Paxlovid: *Avoid using  the ativan or tramadol while on the Paxlovid  -there is a chance of rebound illness after finishing your treatment. If you become sick again please isolate for an additional 5 days, plus 5 more days of masking.   -can use tylenol if needed for fevers, aches and pains per instructions  -warm salt water gargles twice daily  -can use nasal saline rinses a few times per day if you have nasal congestion  -stay hydrated, drink plenty of fluids and eat small healthy meals - avoid dairy  -can take 1000 IU (34mg) Vit D3 and 100-500 mg of Vit C daily per instructions  -If the Covid test is positive, check out the [facility] for more information on home care, transmission and treatment for COVID19  -follow up with your doctor in 2-3 days unless improving and feeling better  -stay home while sick, except to seek medical care. If you have COVID19, you will likely be contagious for 7-10 days. Flu or Influenza is likely contagious for about 7 days. Other respiratory viral infections remain contagious for 5-10+ days depending on the virus and many other factors. Wear a good mask that fits snugly (such as N95 or KN95) if around others to reduce the risk of transmission.  It was nice to meet you today, and I really hope you are feeling better soon. I help [place] out with telemedicine visits on Tuesdays and Thursdays and am happy to help if you need a follow up virtual visit on those days. Otherwise, if you have any concerns or questions following this visit please schedule a follow up visit  with your Primary Care doctor or seek care at a local urgent care clinic to avoid delays in care.    Seek in person care or schedule a follow up video visit promptly if your symptoms worsen, new concerns arise or you are not improving with treatment. Call 911 and/or seek emergency care if your symptoms are severe or life threatening.  FACT SHEET FOR PATIENTS, PARENTS, AND CAREGIVERS EMERGENCY USE AUTHORIZATION (EUA)  OF PAXLOVID FOR CORONAVIRUS DISEASE 2019 (COVID-19) You are being given this Fact Sheet because your healthcare provider believes it is necessary to provide you with PAXLOVID for the treatment of mild-to-moderate coronavirus disease (COVID-19) caused by the SARS-CoV-2 virus. This Fact Sheet contains information to help you understand the risks and benefits of taking the PAXLOVID you have received or may receive. The U.S. Food and Drug Administration (FDA) has issued an Emergency Use Authorization (EUA) to make PAXLOVID available during the COVID-19 pandemic (for more details about an EUA please see What is an Emergency Use Authorization? at the end of this document). PAXLOVID is not an FDA-approved medicine in the [place]. Read this Fact Sheet for information about PAXLOVID. Talk to your healthcare provider about your options or if you have any questions. It is your choice to take PAXLOVID.  What is COVID-19? COVID-19 is caused by a virus called a coronavirus. You can get COVID-19 through close contact with another person who has the virus. COVID-19 illnesses have ranged from very mild-to-severe, including illness resulting in death. While information so far suggests that most COVID-19 illness is mild, serious illness can happen and may cause some of your other medical conditions to become worse. Older people and people of all ages with severe, long lasting (chronic) medical conditions like heart disease, lung disease, and diabetes, for example seem to be at higher risk of being hospitalized for COVID-19.  What is PAXLOVID? PAXLOVID is an investigational medicine used to treat mild-to-moderate COVID-19 in adults and children [38 years of age and older weighing at least 50 pounds (62 kg)] with positive results of direct SARS-CoV-2 viral testing, and who are at high risk for progression to severe COVID-19, including hospitalization or death. PAXLOVID is investigational because it  is still being studied. There is limited information about the safety and effectiveness of using PAXLOVID to treat people with mild-to-moderate COVID-19.  The FDA has authorized the emergency use of PAXLOVID for the treatment of mild-tomoderate COVID-19 in adults and children [68 years of age and older weighing at least 78 pounds (65 kg)] with a positive test for the virus that causes COVID-19, and who are at high risk for progression to severe COVID-19, including hospitalization or death, under an EUA. 1 Revised: 13 September 2020   What should I tell my healthcare provider before I take PAXLOVID? Tell your healthcare provider if you: ? Have any allergies ? Have liver or kidney disease ? Are pregnant or plan to become pregnant ? Are breastfeeding a child ? Have any serious illnesses  Tell your healthcare provider about all the medicines you take, including prescription and over-the-counter medicines, vitamins, and herbal supplements. Some medicines may interact with PAXLOVID and may cause serious side effects. Keep a list of your medicines to show your healthcare provider and pharmacist when you get a new medicine.  You can ask your healthcare provider or pharmacist for a list of medicines that interact with PAXLOVID. Do not start taking a new medicine without telling your healthcare provider. Your healthcare provider can tell  you if it is safe to take PAXLOVID with other medicines.  Tell your healthcare provider if you are taking combined hormonal contraceptive. PAXLOVID may affect how your birth control pills work. Females who are able to become pregnant should use another effective alternative form of contraception or an additional barrier method of contraception. Talk to your healthcare provider if you have any questions about contraceptive methods that might be right for you.  How do I take PAXLOVID? ? PAXLOVID consists of 2 medicines: nirmatrelvir and ritonavir. o Take 2 pink  tablets of nirmatrelvir with 1 white tablet of ritonavir by mouth 2 times each day (in the morning and in the evening) for 5 days. For each dose, take all 3 tablets at the same time. o If you have kidney disease, talk to your healthcare provider. You may need a different dose. ? Swallow the tablets whole. Do not chew, break, or crush the tablets. ? Take PAXLOVID with or without food. ? Do not stop taking PAXLOVID without talking to your healthcare provider, even if you feel better. ? If you miss a dose of PAXLOVID within 8 hours of the time it is usually taken, take it as soon as you remember. If you miss a dose by more than 8 hours, skip the missed dose and take the next dose at your regular time. Do not take 2 doses of PAXLOVID at the same time. ? If you take too much PAXLOVID, call your healthcare provider or go to the nearest hospital emergency room right away. ? If you are taking a ritonavir- or cobicistat-containing medicine to treat hepatitis C or Human Immunodeficiency Virus (HIV), you should continue to take your medicine as prescribed by your healthcare provider. 2 Revised: 13 September 2020    Talk to your healthcare provider if you do not feel better or if you feel worse after 5 days.  Who should generally not take PAXLOVID? Do not take PAXLOVID if: ? You are allergic to nirmatrelvir, ritonavir, or any of the ingredients in PAXLOVID. ? You are taking any of the following medicines: o Alfuzosin o Pethidine, propoxyphene o Ranolazine o Amiodarone, dronedarone, flecainide, propafenone, quinidine o Colchicine o Lurasidone, pimozide, clozapine o Dihydroergotamine, ergotamine, methylergonovine o Lovastatin, simvastatin o Sildenafil (Revatio) for pulmonary arterial hypertension (PAH) o Triazolam, oral midazolam o Apalutamide o Carbamazepine, phenobarbital, phenytoin o Rifampin o St. Johns Wort (hypericum perforatum) Taking PAXLOVID with these medicines may cause serious or  life-threatening side effects or affect how PAXLOVID works.  These are not the only medicines that may cause serious side effects if taken with PAXLOVID. PAXLOVID may increase or decrease the levels of multiple other medicines. It is very important to tell your healthcare provider about all of the medicines you are taking because additional laboratory tests or changes in the dose of your other medicines may be necessary while you are taking PAXLOVID. Your healthcare provider may also tell you about specific symptoms to watch out for that may indicate that you need to stop or decrease the dose of some of your other medicines.  What are the important possible side effects of PAXLOVID? Possible side effects of PAXLOVID are: ? Allergic Reactions. Allergic reactions can happen in people taking PAXLOVID, even after only 1 dose. Stop taking PAXLOVID and call your healthcare provider right away if you get any of the following symptoms of an allergic reaction: o hives o trouble swallowing or breathing o swelling of the mouth, lips, or face o throat tightness o hoarseness 3  Revised: 13 September 2020  o skin rash ? Liver Problems. Tell your healthcare provider right away if you have any of these signs and symptoms of liver problems: loss of appetite, yellowing of your skin and the whites of eyes (jaundice), dark-colored urine, pale colored stools and itchy skin, stomach area (abdominal) pain. ? Resistance to HIV Medicines. If you have untreated HIV infection, PAXLOVID may lead to some HIV medicines not working as well in the future. ? Other possible side effects include: o altered sense of taste o diarrhea o high blood pressure o muscle aches These are not all the possible side effects of PAXLOVID. Not many people have taken PAXLOVID. Serious and unexpected side effects may happen. PAXLOVID is still being studied, so it is possible that all of the risks are not known at this time.  What  other treatment choices are there? Veklury (remdesivir) is FDA-approved for the treatment of mild-to-moderate [id number]-24 in certain adults and children. Talk with your doctor to see if Haley Armstrong is appropriate for you. Like PAXLOVID, FDA may also allow for the emergency use of other medicines to treat people with COVID-19. Go to https://price.info/ for information on the emergency use of other medicines that are authorized by FDA to treat people with COVID-19. Your healthcare provider may talk with you about clinical trials for which you may be eligible. It is your choice to be treated or not to be treated with PAXLOVID. Should you decide not to receive it or for your child not to receive it, it will not change your standard medical care.  What if I am pregnant or breastfeeding? There is no experience treating pregnant women or breastfeeding mothers with PAXLOVID. For a mother and unborn baby, the benefit of taking PAXLOVID may be greater than the risk from the treatment. If you are pregnant, discuss your options and specific situation with your healthcare provider. It is recommended that you use effective barrier contraception or do not have sexual activity while taking PAXLOVID. If you are breastfeeding, discuss your options and specific situation with your healthcare provider. 4 Revised: 13 September 2020   How do I report side effects with PAXLOVID? Contact your healthcare provider if you have any side effects that bother you or do not go away. Report side effects to FDA MedWatch at SmoothHits.hu or call 1-800-FDA1088 or you can report side effects to [employer]. at the contact information provided below. Website Fax number Telephone number www.pfizersafetyreporting.com ([phone] How should I store [place]? Store PAXLOVID tablets at room temperature,  between 68?F to 77?F (20?C to 25?C). How can I learn more about COVID-19? ? Ask your healthcare provider. ? Visit [email or web address]-johnson.[email or web address] ? Contact your local or state public health department. What is an Emergency Use Authorization (EUA)? The [place] FDA has made PAXLOVID available under an emergency access mechanism called an Emergency Use Authorization (EUA). The EUA is supported by a Education officer, museum and Human Service (HHS) declaration that circumstances exist to justify the emergency use of drugs and biological products during the COVID-19 pandemic. PAXLOVID for the treatment of mild-to-moderate COVID-19 in adults and children [35 years of age and older weighing at least 31 pounds (2 kg)] with positive results of direct SARS-CoV-2 viral testing, and who are at high risk for progression to severe COVID-19, including hospitalization or death, has not undergone the same type of review as an FDA-approved product. In issuing an EUA under the DEYCX-44 public health emergency, the FDA has determined, among other things, that  based on the total amount of scientific evidence available including data from adequate and well-controlled clinical trials, if available, it is reasonable to believe that the product may be effective for diagnosing, treating, or preventing COVID-19, or a serious or life-threatening disease or condition caused by COVID-19; that the known and potential benefits of the product, when used to diagnose, treat, or prevent such disease or condition, outweigh the known and potential risks of such product; and that there are no adequate, approved, and available alternatives. All of these criteria must be met to allow for the product to be used in the treatment of patients during the COVID-19 pandemic. The EUA for PAXLOVID is in effect for the duration of the COVID-19 declaration justifying emergency use of this product, unless terminated or revoked (after which the  products may no longer be used under the EUA). 5 Revised: 13 September 2020     Additional Information For general questions, visit the website or call the telephone number provided below. Website Telephone number www.COVID19oralRx.com [phone] (1-877-C19-PACK) You can also go to www.pfizermedinfo.com or call [phone] for more information. [id number] Revised: 13 September 2020

## 2021-09-08 ENCOUNTER — Encounter: Payer: Self-pay | Admitting: Family Medicine

## 2021-09-09 NOTE — Telephone Encounter (Signed)
Called and left message for patient to call office . To see what symptoms she is having and to schedule a VV with a provider to be seen  ?

## 2021-09-15 ENCOUNTER — Other Ambulatory Visit: Payer: Self-pay

## 2021-09-15 ENCOUNTER — Emergency Department (HOSPITAL_BASED_OUTPATIENT_CLINIC_OR_DEPARTMENT_OTHER)
Admission: EM | Admit: 2021-09-15 | Discharge: 2021-09-16 | Disposition: A | Discharge status: Home / Self Care | Payer: Medicare Other | Attending: Emergency Medicine | Admitting: Emergency Medicine

## 2021-09-15 ENCOUNTER — Emergency Department (HOSPITAL_BASED_OUTPATIENT_CLINIC_OR_DEPARTMENT_OTHER): Payer: Medicare Other

## 2021-09-15 ENCOUNTER — Encounter (HOSPITAL_BASED_OUTPATIENT_CLINIC_OR_DEPARTMENT_OTHER): Payer: Self-pay | Admitting: Emergency Medicine

## 2021-09-15 ENCOUNTER — Emergency Department (HOSPITAL_COMMUNITY): Payer: Medicare Other

## 2021-09-15 DIAGNOSIS — Z8616 Personal history of COVID-19: Secondary | ICD-10-CM | POA: Insufficient documentation

## 2021-09-15 DIAGNOSIS — E871 Hypo-osmolality and hyponatremia: Secondary | ICD-10-CM | POA: Insufficient documentation

## 2021-09-15 DIAGNOSIS — I6622 Occlusion and stenosis of left posterior cerebral artery: Secondary | ICD-10-CM | POA: Insufficient documentation

## 2021-09-15 DIAGNOSIS — Z79899 Other long term (current) drug therapy: Secondary | ICD-10-CM | POA: Insufficient documentation

## 2021-09-15 DIAGNOSIS — I6522 Occlusion and stenosis of left carotid artery: Secondary | ICD-10-CM | POA: Diagnosis not present

## 2021-09-15 DIAGNOSIS — I10 Essential (primary) hypertension: Secondary | ICD-10-CM | POA: Insufficient documentation

## 2021-09-15 DIAGNOSIS — R42 Dizziness and giddiness: Secondary | ICD-10-CM | POA: Diagnosis not present

## 2021-09-15 DIAGNOSIS — D72829 Elevated white blood cell count, unspecified: Secondary | ICD-10-CM | POA: Diagnosis not present

## 2021-09-15 DIAGNOSIS — R519 Headache, unspecified: Secondary | ICD-10-CM | POA: Diagnosis not present

## 2021-09-15 LAB — URINALYSIS, ROUTINE W REFLEX MICROSCOPIC
Bilirubin Urine: NEGATIVE
Glucose, UA: NEGATIVE mg/dL
Hgb urine dipstick: NEGATIVE
Ketones, ur: NEGATIVE mg/dL
Leukocytes,Ua: NEGATIVE
Nitrite: NEGATIVE
Protein, ur: NEGATIVE mg/dL
Specific Gravity, Urine: 1.015 (ref 1.005–1.030)
pH: 7 (ref 5.0–8.0)

## 2021-09-15 LAB — CBC WITH DIFFERENTIAL/PLATELET
Abs Immature Granulocytes: 0.04 10*3/uL (ref 0.00–0.07)
Basophils Absolute: 0.1 10*3/uL (ref 0.0–0.1)
Basophils Relative: 1 %
Eosinophils Absolute: 0.1 10*3/uL (ref 0.0–0.5)
Eosinophils Relative: 1 %
HCT: 39 % (ref 36.0–46.0)
Hemoglobin: 13.2 g/dL (ref 12.0–15.0)
Immature Granulocytes: 0 %
Lymphocytes Relative: 10 %
Lymphs Abs: 1.2 10*3/uL (ref 0.7–4.0)
MCH: 31.7 pg (ref 26.0–34.0)
MCHC: 33.8 g/dL (ref 30.0–36.0)
MCV: 93.5 fL (ref 80.0–100.0)
Monocytes Absolute: 0.9 10*3/uL (ref 0.1–1.0)
Monocytes Relative: 7 %
Neutro Abs: 10.1 10*3/uL — ABNORMAL HIGH (ref 1.7–7.7)
Neutrophils Relative %: 81 %
Platelets: 382 10*3/uL (ref 150–400)
RBC: 4.17 MIL/uL (ref 3.87–5.11)
RDW: 13 % (ref 11.5–15.5)
WBC: 12.4 10*3/uL — ABNORMAL HIGH (ref 4.0–10.5)
nRBC: 0 % (ref 0.0–0.2)

## 2021-09-15 LAB — COMPREHENSIVE METABOLIC PANEL
ALT: 16 U/L (ref 0–44)
AST: 21 U/L (ref 15–41)
Albumin: 3.6 g/dL (ref 3.5–5.0)
Alkaline Phosphatase: 58 U/L (ref 38–126)
Anion gap: 8 (ref 5–15)
BUN: 15 mg/dL (ref 8–23)
CO2: 26 mmol/L (ref 22–32)
Calcium: 8.8 mg/dL — ABNORMAL LOW (ref 8.9–10.3)
Chloride: 100 mmol/L (ref 98–111)
Creatinine, Ser: 0.83 mg/dL (ref 0.44–1.00)
GFR, Estimated: >60 mL/min (ref >60)
Glucose, Bld: 114 mg/dL — ABNORMAL HIGH (ref 70–99)
Potassium: 4.8 mmol/L (ref 3.5–5.1)
Sodium: 134 mmol/L — ABNORMAL LOW (ref 135–145)
Total Bilirubin: 0.4 mg/dL (ref 0.3–1.2)
Total Protein: 7.1 g/dL (ref 6.5–8.1)

## 2021-09-15 MED ORDER — DIAZEPAM 5 MG/ML IJ SOLN
2.5000 mg | Freq: Once | INTRAMUSCULAR | Status: AC
  Administered 2021-09-15: 2.5 mg via INTRAVENOUS
  Filled 2021-09-15: qty 2

## 2021-09-15 MED ORDER — SODIUM CHLORIDE 0.9 % IV BOLUS
1000.0000 mL | Freq: Once | INTRAVENOUS | Status: AC
Start: 2021-09-15 — End: 2021-09-15
  Administered 2021-09-15: 1000 mL via INTRAVENOUS

## 2021-09-15 MED ORDER — ACETAMINOPHEN 500 MG PO TABS
1000.0000 mg | ORAL_TABLET | Freq: Once | ORAL | Status: AC
  Administered 2021-09-15: 1000 mg via ORAL

## 2021-09-15 MED ORDER — ACETAMINOPHEN 500 MG PO TABS
ORAL_TABLET | ORAL | Status: AC
  Filled 2021-09-15: qty 2

## 2021-09-15 MED ORDER — GADOBUTROL 1 MMOL/ML IV SOLN
6.5000 mL | Freq: Once | INTRAVENOUS | Status: AC | PRN
  Administered 2021-09-15: 6.5 mL via INTRAVENOUS

## 2021-09-15 MED ORDER — MECLIZINE HCL 25 MG PO TABS
25.0000 mg | ORAL_TABLET | Freq: Once | ORAL | Status: AC
  Administered 2021-09-15: 25 mg via ORAL
  Filled 2021-09-15: qty 1

## 2021-09-15 NOTE — ED Triage Notes (Signed)
Pt arrives pov with c/o dizziness that started at 0430 upon waking. Reports HA  and n/v ~ 42mn pta. Felt like left leg "went to sleep" at ~ 11. Pt AOx4, bilaterally equal. Recent dx with covid x 1 weeks pta ?

## 2021-09-15 NOTE — ED Notes (Signed)
ED Provider at bedside. 

## 2021-09-15 NOTE — ED Provider Notes (Signed)
?[facility]PART[facility] ?Provider Note ? ? ?CSN: [id number] ?Arrival date & time: 09/15/21  1634 ? ?  ? ?History ? ?Chief Complaint  ?Patient presents with  ? Dizziness  ? ? ?Haley Armstrong is a 76 y.o. female. ? ?Patient here with dizziness that started when she first woke up at 4:30 AM about 13 hours ago.  She has felt swimmy headed she states all day.  She has had some intermittent nausea feelings.  She had a headache for some portion of today that has now resolved.  She thought maybe she had a headache for 30 minutes earlier this afternoon.  At 11 AM she felt like her left lower leg felt sleepy.  She states that she has been able to walk but does feel swimmy headed.  She states that she had COVID over a week ago.  Currently she has no weakness or numbness or headache.  She is feeling nauseous and feels lightheaded.She thinks that she has been diagnosed with vertigo in the past.  Patient has a history of hypertension, anxiety, irritable bowel syndrome. ? ?The history is provided by the patient.  ?Dizziness ?Quality:  Lightheadedness ?Severity:  Mild ?Onset quality:  Gradual ?Duration:  14 hours ?Timing:  Constant ?Progression:  Waxing and waning ?Chronicity:  New ?Context: head movement   ?Relieved by:  Nothing ?Worsened by:  Nothing ?Associated symptoms: headaches, nausea and vomiting   ?Associated symptoms: no blood in stool, no chest pain, no diarrhea, no hearing loss, no palpitations, no shortness of breath, no syncope, no tinnitus, no vision changes and no weakness   ?Risk factors: hx of vertigo   ?Risk factors: no hx of stroke   ? ?  ? ?Home Medications ?Prior to Admission medications   ?Medication Sig Start Date End Date Taking? Authorizing Provider  ?amLODipine (NORVASC) 2.5 MG tablet Take 1 tablet (2.5 mg total) by mouth daily. 09/03/20   [name], Evie Lacks, MD  ?Apoaequorin (PREVAGEN) 10 MG CAPS Take by mouth.    [provider]  ?benzonatate (TESSALON PERLES) 100 MG capsule  1-2 capsules up to twice daily as needed for cough 09/04/21   Lucretia Kern, DO  ?buPROPion (WELLBUTRIN XL) 150 MG 24 hr tablet TAKE 1 TABLET (150 MG TOTAL) BY MOUTH DAILY. TAKE IN AM 08/22/21   [name], Evie Lacks, MD  ?Cholecalciferol (VITAMIN D3) 50 MCG (2000 UT) capsule Take 1 capsule (2,000 Units total) by mouth daily. 01/04/20   [name], Evie Lacks, MD  ?diclofenac sodium (VOLTAREN) 1 % GEL Apply 4 g topically 4 (four) times daily. 01/08/16   Wallene Huh, DPM  ?enalapril (VASOTEC) 5 MG tablet TAKE 1 TABLET 2 TIMES DAILY 09/03/20   [name], Evie Lacks, MD  ?hyoscyamine (LEVBID) 0.375 MG 12 hr tablet TAKE ONE TABLET BY MOUTH TWICE A DAY 08/18/21   [name], Evie Lacks, MD  ?hyoscyamine (LEVSIN SL) 0.125 MG SL tablet DISSOLVE 1-2 TABLETS UNDER THE TONGUE EVERY FOUR HOURS AS NEEDED FOR PAIN 03/13/20   [name], Evie Lacks, MD  ?ipratropium (ATROVENT) 0.03 % nasal spray ipratropium bromide 21 mcg (0.03 %) nasal spray    [provider]  ?levothyroxine (SYNTHROID) 25 MCG tablet TAKE ONE TABLET BY MOUTH EVERY DAY BEFORE BREAKFAST 09/03/20   [name], Evie Lacks, MD  ?LORazepam (ATIVAN) 0.5 MG tablet Take 1 by mouth every night at bedtime as needed for sleep 05/18/19   [name], Evie Lacks, MD  ?Lutein-Zeaxanthin 25-5 MG CAPS Take by mouth. Takes one daily  [provider]  ?meloxicam (MOBIC) 15 MG tablet TAKE 1 TABLET BY MOUTH EVERY DAY AFTER SURGERY AS NEEDED ?Patient taking differently: TAKE 0.5 TABLET BY MOUTH BID 03/04/21   [name], Evie Lacks, MD  ?methocarbamol (ROBAXIN) 500 MG tablet TAKE ONE TABLET BY MOUTH FOUR TIMES DAILY AS NEEDED 07/29/21   [name], Evie Lacks, MD  ?Multiple Vitamins-Minerals (MULTIVITAMIN,TX-MINERALS) tablet Take 1 tablet by mouth daily.      [provider]  ?NON FORMULARY Vitamin C, Osteo BiFlex, Vitamin B12 - PO daily    [provider]  ?Omega 3 1000 MG CAPS Take 2 capsules by mouth daily.    [provider]  ?pantoprazole (PROTONIX) 40 MG  tablet TAKE 1 TABLET BY MOUTH EVERY DAY 08/12/21   [name], Evie Lacks, MD  ?SUMAtriptan (IMITREX) 100 MG tablet Take 1 tablet (100 mg total) by mouth every 2 (two) hours as needed for migraine. May repeat in 2 hours if headache persists or recurs. 01/03/21   [name], Evie Lacks, MD  ?traMADol Veatrice Bourbon) 50 MG tablet     [provider]  ?triamcinolone cream (KENALOG) 0.1 %  01/31/19   [provider]  ?   ? ?Allergies    ?Patient has no known allergies.   ? ?Review of Systems   ?Review of Systems  ?HENT:  Negative for hearing loss and tinnitus.   ?Respiratory:  Negative for shortness of breath.   ?Cardiovascular:  Negative for chest pain, palpitations and syncope.  ?Gastrointestinal:  Positive for nausea and vomiting. Negative for blood in stool and diarrhea.  ?Neurological:  Positive for dizziness and headaches. Negative for weakness.  ? ?Physical Exam ?Updated Vital Signs ?BP (!) 156/66   Pulse 69   Temp 97.6 ?F (36.4 ?C) (Oral)   Resp 14   Ht '5\' 4"'$  (1.626 m)   Wt 63 kg   SpO2 100%   BMI 23.86 kg/m?  ?Physical Exam ?Vitals and nursing note reviewed.  ?Constitutional:   ?   General: She is not in acute distress. ?   Appearance: She is well-developed.  ?HENT:  ?   Head: Normocephalic and atraumatic.  ?   Mouth/Throat:  ?   Mouth: Mucous membranes are moist.  ?Eyes:  ?   Extraocular Movements: Extraocular movements intact.  ?   Conjunctiva/sclera: Conjunctivae normal.  ?   Pupils: Pupils are equal, round, and reactive to light.  ?Cardiovascular:  ?   Rate and Rhythm: Normal rate and regular rhythm.  ?   Heart sounds: No murmur heard. ?Pulmonary:  ?   Effort: Pulmonary effort is normal. No respiratory distress.  ?   Breath sounds: Normal breath sounds.  ?Abdominal:  ?   Palpations: Abdomen is soft.  ?   Tenderness: There is no abdominal tenderness.  ?Musculoskeletal:     ?   General: No swelling.  ?   Cervical back: Neck supple.  ?Skin: ?   General: Skin is warm and dry.  ?   Capillary Refill:  Capillary refill takes less than 2 seconds.  ?Neurological:  ?   General: No focal deficit present.  ?   Mental Status: She is alert and oriented to person, place, and time.  ?   Comments: 5+ out of 5 strength throughout, normal sensation, no drift, normal finger-nose-finger, normal heel-to-shin, able to ambulate short distances but gets slightly off balance, normal visual fields, normal speech, normal visual acuity, no obvious vertical or horizontal nystagmus  ?Psychiatric:     ?  Mood and Affect: Mood normal.  ? ? ?ED Results / Procedures / Treatments   ?Labs ?(all labs ordered are listed, but only abnormal results are displayed) ?Labs Reviewed  ?CBC WITH DIFFERENTIAL/PLATELET - Abnormal; Notable for the following components:  ?    Result Value  ? WBC 12.4 (*)   ? Neutro Abs 10.1 (*)   ? All other components within normal limits  ?COMPREHENSIVE METABOLIC PANEL - Abnormal; Notable for the following components:  ? Sodium 134 (*)   ? Glucose, Bld 114 (*)   ? Calcium 8.8 (*)   ? All other components within normal limits  ?URINALYSIS, ROUTINE W REFLEX MICROSCOPIC  ? ? ?EKG ?EKG Interpretation ? ?Date/Time:  Monday September 15 2021 16:45:55 EDT ?Ventricular Rate:  69 ?PR Interval:  152 ?QRS Duration: 76 ?QT Interval:  414 ?QTC Calculation: 443 ?R Axis:   46 ?Text Interpretation: Normal sinus rhythm When compared with ECG of 20-Jun-2019 14:26, PREVIOUS ECG IS PRESENT Confirmed by Lennice Sites [phone]) on 09/15/2021 5:45:41 PM ? ?Radiology ?CT Head Wo Contrast ? ?Result Date: 09/15/2021 ?CLINICAL DATA:  Sudden onset of severe headache, new or worsening. EXAM: CT HEAD WITHOUT CONTRAST TECHNIQUE: Contiguous axial images were obtained from the base of the skull through the vertex without intravenous contrast. RADIATION DOSE REDUCTION: This exam was performed according to the departmental dose-optimization program which includes automated exposure control, adjustment of the mA and/or kV according to patient size and/or use of  iterative reconstruction technique. COMPARISON:  Head CT and brain MRI 06/20/2019 FINDINGS: Brain: No intracranial hemorrhage, mass effect, or midline shift. No hydrocephalus. The basilar cisterns are patent. Brain v

## 2021-09-15 NOTE — ED Notes (Signed)
Report given to CN, [place] at [facility] ED ?

## 2021-09-15 NOTE — ED Notes (Signed)
Patient transported to CT 

## 2021-09-16 ENCOUNTER — Telehealth: Payer: Self-pay

## 2021-09-16 MED ORDER — MECLIZINE HCL 12.5 MG PO TABS: 12.5000 mg | ORAL_TABLET | Freq: Three times a day (TID) | ORAL | 0 refills | Status: DC | PRN

## 2021-09-16 MED ORDER — ONDANSETRON 4 MG PO TBDP: 4.0000 mg | ORAL_TABLET | Freq: Three times a day (TID) | ORAL | 0 refills | Status: AC | PRN

## 2021-09-16 MED ORDER — FLUTICASONE PROPIONATE 50 MCG/ACT NA SUSP: 1.0000 | Freq: Every day | NASAL | 0 refills | Status: DC

## 2021-09-16 NOTE — ED Provider Notes (Signed)
Assumed care of patient in transfer from [facility] emergency department pending MRI and reassessment. ? ?Please see prior provider note for full H&P. ?Briefly patient is a 76 year old female who presented to the emergency department with acute onset dizziness that began at 430 this morning, further describes this as swimmy headed with associated nausea and headache.  She received Valium and meclizine as well as Tylenol at prior emergency department.  Currently she states she is feeling much better, states she feels like the meclizine really helps, she has been able to ambulate in the emergency department. ? ?Physical Exam  ?BP 138/73 (BP Location: Right Arm)   Pulse 63   Temp 97.6 ?F (36.4 ?C) (Oral)   Resp 12   Ht '5\' 4"'$  (1.626 m)   Wt 63 kg   SpO2 99%   BMI 23.86 kg/m?  ? ?Physical Exam ?Vitals and nursing note reviewed.  ?Constitutional:   ?   General: She is not in acute distress. ?   Appearance: She is well-developed.  ?HENT:  ?   Head: Normocephalic and atraumatic.  ?Eyes:  ?   General:     ?   Right eye: No discharge.     ?   Left eye: No discharge.  ?   Conjunctiva/sclera: Conjunctivae normal.  ?Pulmonary:  ?   Effort: No respiratory distress.  ?Neurological:  ?   Mental Status: She is alert.  ?   Comments: Clear speech.  No facial droop.  Extraocular movements intact without rotational or vertical nystagmus.  Sensation grossly tact bilateral upper and lower extremities.  5-5 symmetric grip strength.  Intact finger-nose.  Patient is ambulatory, not ataxic.  ?Psychiatric:     ?   Behavior: Behavior normal.     ?   Thought Content: Thought content normal.  ? ? ?Procedures  ?Procedures ? ?ED Course / MDM  ?  ?Results for orders placed or performed during the hospital encounter of 09/15/21  ?CBC with Differential  ?Result Value Ref Range  ? WBC 12.4 (H) 4.0 - 10.5 K/uL  ? RBC 4.17 3.87 - 5.11 MIL/uL  ? Hemoglobin 13.2 12.0 - 15.0 g/dL  ? HCT 39.0 36.0 - 46.0 %  ? MCV 93.5 80.0 - 100.0 fL  ? MCH  31.7 26.0 - 34.0 pg  ? MCHC 33.8 30.0 - 36.0 g/dL  ? RDW 13.0 11.5 - 15.5 %  ? Platelets 382 150 - 400 K/uL  ? nRBC 0.0 0.0 - 0.2 %  ? Neutrophils Relative % 81 %  ? Neutro Abs 10.1 (H) 1.7 - 7.7 K/uL  ? Lymphocytes Relative 10 %  ? Lymphs Abs 1.2 0.7 - 4.0 K/uL  ? Monocytes Relative 7 %  ? Monocytes Absolute 0.9 0.1 - 1.0 K/uL  ? Eosinophils Relative 1 %  ? Eosinophils Absolute 0.1 0.0 - 0.5 K/uL  ? Basophils Relative 1 %  ? Basophils Absolute 0.1 0.0 - 0.1 K/uL  ? Immature Granulocytes 0 %  ? Abs Immature Granulocytes 0.04 0.00 - 0.07 K/uL  ?Comprehensive metabolic panel  ?Result Value Ref Range  ? Sodium 134 (L) 135 - 145 mmol/L  ? Potassium 4.8 3.5 - 5.1 mmol/L  ? Chloride 100 98 - 111 mmol/L  ? CO2 26 22 - 32 mmol/L  ? Glucose, Bld 114 (H) 70 - 99 mg/dL  ? BUN 15 8 - 23 mg/dL  ? Creatinine, Ser 0.83 0.44 - 1.00 mg/dL  ? Calcium 8.8 (L) 8.9 - 10.3 mg/dL  ?  Total Protein 7.1 6.5 - 8.1 g/dL  ? Albumin 3.6 3.5 - 5.0 g/dL  ? AST 21 15 - 41 U/L  ? ALT 16 0 - 44 U/L  ? Alkaline Phosphatase 58 38 - 126 U/L  ? Total Bilirubin 0.4 0.3 - 1.2 mg/dL  ? GFR, Estimated >60 >60 mL/min  ? Anion gap 8 5 - 15  ?Urinalysis, Routine w reflex microscopic Urine, Clean Catch  ?Result Value Ref Range  ? Color, Urine YELLOW YELLOW  ? APPearance CLEAR CLEAR  ? Specific Gravity, Urine 1.015 1.005 - 1.030  ? pH 7.0 5.0 - 8.0  ? Glucose, UA NEGATIVE NEGATIVE mg/dL  ? Hgb urine dipstick NEGATIVE NEGATIVE  ? Bilirubin Urine NEGATIVE NEGATIVE  ? Ketones, ur NEGATIVE NEGATIVE mg/dL  ? Protein, ur NEGATIVE NEGATIVE mg/dL  ? Nitrite NEGATIVE NEGATIVE  ? Leukocytes,Ua NEGATIVE NEGATIVE  ? ?CT Head Wo Contrast ? ?Result Date: 09/15/2021 ?CLINICAL DATA:  Sudden onset of severe headache, new or worsening. EXAM: CT HEAD WITHOUT CONTRAST TECHNIQUE: Contiguous axial images were obtained from the base of the skull through the vertex without intravenous contrast. RADIATION DOSE REDUCTION: This exam was performed according to the departmental  dose-optimization program which includes automated exposure control, adjustment of the mA and/or kV according to patient size and/or use of iterative reconstruction technique. COMPARISON:  Head CT and brain MRI 06/20/2019 FINDINGS: Brain: No intracranial hemorrhage, mass effect, or midline shift. No hydrocephalus. The basilar cisterns are patent. Brain volume is normal for age. No evidence of territorial infarct or acute ischemia. No extra-axial or intracranial fluid collection. Vascular: Atherosclerosis of skullbase vasculature without hyperdense vessel or abnormal calcification. Skull: No fracture or focal lesion. Sinuses/Orbits: Chronic opacification of posterior lower left mastoid air cells. Paranasal sinuses are clear. No orbital abnormalities. Other: None. IMPRESSION: 1. No acute intracranial abnormality. 2. Chronic opacification of posterior lower left mastoid air cells. Electronically Signed   By: Keith Rake M.D.   On: 09/15/2021 18:11  ? ?MR ANGIO HEAD WO CONTRAST ? ?Result Date: 09/15/2021 ?CLINICAL DATA:  Dizziness and headache EXAM: MRI HEAD WITHOUT CONTRAST MRA HEAD WITHOUT CONTRAST MRA OF THE NECK WITHOUT AND WITH CONTRAST TECHNIQUE: Multiplanar, multi-echo pulse sequences of the brain and surrounding structures were acquired without intravenous contrast. Angiographic images of the Circle of Willis were acquired using MRA technique without intravenous contrast. Angiographic images of the neck were acquired using MRA technique without and with intravenous contrast. Carotid stenosis measurements (when applicable) are obtained utilizing NASCET criteria, using the distal internal carotid diameter as the denominator. CONTRAST:  6.81m GADAVIST GADOBUTROL 1 MMOL/ML IV SOLN COMPARISON:  No pertinent prior exam. FINDINGS: MR HEAD FINDINGS Brain: No acute infarct, mass effect or extra-axial collection. No acute or chronic hemorrhage. Normal white matter signal, parenchymal volume and CSF spaces. The midline  structures are normal. Vascular: Major flow voids are preserved. Skull and upper cervical spine: Normal calvarium and skull base. Visualized upper cervical spine and soft tissues are normal. Sinuses/Orbits:No paranasal sinus fluid levels or advanced mucosal thickening. No mastoid or middle ear effusion. Normal orbits. MRA HEAD FINDINGS POSTERIOR CIRCULATION: --Vertebral arteries: Normal --Inferior cerebellar arteries: Normal. --Basilar artery: Normal. --Superior cerebellar arteries: Normal. --Posterior cerebral arteries: Mild stenosis at the left P1-2 junction. Otherwise normal. ANTERIOR CIRCULATION: --Intracranial internal carotid arteries: Normal. --Anterior cerebral arteries (ACA): Normal. --Middle cerebral arteries (MCA): Normal. ANATOMIC VARIANTS: None MRA NECK FINDINGS Aortic arch: Normal Right carotid system: Normal Left carotid system: Normal Vertebral arteries: Codominant system.  Normal. Other:  None. IMPRESSION: 1. Normal MRI of the brain. 2. Mild stenosis at the left PCA P1-2 junction. 3. Normal MRA of the neck. Electronically Signed   By: Ulyses Jarred M.D.   On: 09/15/2021 22:56  ? ?MR Angiogram Neck W or Wo Contrast ? ?Result Date: 09/15/2021 ?CLINICAL DATA:  Dizziness and headache EXAM: MRI HEAD WITHOUT CONTRAST MRA HEAD WITHOUT CONTRAST MRA OF THE NECK WITHOUT AND WITH CONTRAST TECHNIQUE: Multiplanar, multi-echo pulse sequences of the brain and surrounding structures were acquired without intravenous contrast. Angiographic images of the Circle of Willis were acquired using MRA technique without intravenous contrast. Angiographic images of the neck were acquired using MRA technique without and with intravenous contrast. Carotid stenosis measurements (when applicable) are obtained utilizing NASCET criteria, using the distal internal carotid diameter as the denominator. CONTRAST:  6.33m GADAVIST GADOBUTROL 1 MMOL/ML IV SOLN COMPARISON:  No pertinent prior exam. FINDINGS: MR HEAD FINDINGS Brain: No acute  infarct, mass effect or extra-axial collection. No acute or chronic hemorrhage. Normal white matter signal, parenchymal volume and CSF spaces. The midline structures are normal. Vascular: Major flow voids are preserved.

## 2021-09-16 NOTE — Discharge Instructions (Addendum)
You were seen in the emergency department today for dizziness.  Your MRI imaging did not show findings of an acute stroke.  We suspect that you have vertigo.  We are sending you home with meclizine to take every 8 hours as needed for dizziness.  We are also sending home with Zofran to take every 8 hours as needed for nausea and vomiting.  We have sent in a prescription for Flonase which is a nasal spray steroid to help with your postnasal drip. ? ?We have prescribed you new medication(s) today. Discuss the medications prescribed today with your pharmacist as they can have adverse effects and interactions with your other medicines including over the counter and prescribed medications. Seek medical evaluation if you start to experience new or abnormal symptoms after taking one of these medicines, seek care immediately if you start to experience difficulty breathing, feeling of your throat closing, facial swelling, or rash as these could be indications of a more serious allergic reaction ? ? ?Please follow-up with primary care soon as possible.  Return to the emergency department for new or worsening symptoms including but not limited to new or worsening pain, persistent dizziness after taking meclizine, new dizziness, numbness, weakness, difficulty walking, passing out, chest pain, trouble breathing, or any other concerns. ?

## 2021-09-16 NOTE — ED Notes (Signed)
Pt A&OX4 at d/c, verbalized understanding of d/c instructions, prescriptions and follow up care. Pt wheeled out of ED via wheelchair, pts husband to drive her home ?

## 2021-09-16 NOTE — Telephone Encounter (Signed)
Pt is requesting that Dr. Alain Marion take a look at her labs and CT scan from 3/20 and consider calling her something in for her sinus.  ? ?Pharmacy: ?[id number] [place] [address]? ?Please advise  ?

## 2021-09-18 NOTE — Telephone Encounter (Signed)
Haley Armstrong  I reviewed ER notes, CT and MRI/MRI reports. ?Please see me if problems. ?Thanks ?

## 2021-09-18 NOTE — Telephone Encounter (Signed)
Pt states that she has a FU scheduled on 4/4 and she discuss it then at that appt. I offered to rescheduled that to a sooner appt and pt declined. ? ?FYI  ?

## 2021-09-30 ENCOUNTER — Encounter: Payer: Self-pay | Admitting: Internal Medicine

## 2021-09-30 ENCOUNTER — Ambulatory Visit (INDEPENDENT_AMBULATORY_CARE_PROVIDER_SITE_OTHER): Payer: Medicare Other | Admitting: Internal Medicine

## 2021-09-30 DIAGNOSIS — Z8616 Personal history of COVID-19: Secondary | ICD-10-CM | POA: Diagnosis not present

## 2021-09-30 DIAGNOSIS — H9202 Otalgia, left ear: Secondary | ICD-10-CM

## 2021-09-30 DIAGNOSIS — R42 Dizziness and giddiness: Secondary | ICD-10-CM | POA: Diagnosis not present

## 2021-09-30 DIAGNOSIS — J0110 Acute frontal sinusitis, unspecified: Secondary | ICD-10-CM | POA: Diagnosis not present

## 2021-09-30 DIAGNOSIS — R300 Dysuria: Secondary | ICD-10-CM | POA: Insufficient documentation

## 2021-09-30 DIAGNOSIS — M542 Cervicalgia: Secondary | ICD-10-CM | POA: Diagnosis not present

## 2021-09-30 MED ORDER — AMOXICILLIN-POT CLAVULANATE 875-125 MG PO TABS
1.0000 | ORAL_TABLET | Freq: Two times a day (BID) | ORAL | 0 refills | Status: DC
Start: 2021-09-30 — End: 2021-10-29

## 2021-09-30 NOTE — Progress Notes (Signed)
? ?Subjective:  ?Patient ID: Haley Armstrong, female    DOB: [birth year]  Age: 76 y.o. MRN: [id number] ? ?CC: No chief complaint on file. ? ? ?HPI ?Haley Armstrong presents for sinusitis sx's, HAs on the L ?F/u on recent vertigo ?Haley Armstrong had COVID 19 in March 2023 ?Head CT w/Chronic opacification of posterior lower left mastoid air cells 09/15/21 ? ?Outpatient Medications Prior to Visit  ?Medication Sig Dispense Refill  ? amLODipine (NORVASC) 2.5 MG tablet Take 1 tablet (2.5 mg total) by mouth daily. 90 tablet 3  ? Apoaequorin (PREVAGEN) 10 MG CAPS Take by mouth.    ? buPROPion (WELLBUTRIN XL) 150 MG 24 hr tablet TAKE 1 TABLET (150 MG TOTAL) BY MOUTH DAILY. TAKE IN AM 90 tablet 2  ? Cholecalciferol (VITAMIN D3) 50 MCG (2000 UT) capsule Take 1 capsule (2,000 Units total) by mouth daily. 100 capsule 3  ? diclofenac sodium (VOLTAREN) 1 % GEL Apply 4 g topically 4 (four) times daily. 100 Tube 11  ? enalapril (VASOTEC) 5 MG tablet TAKE 1 TABLET 2 TIMES DAILY 180 tablet 3  ? fluticasone (FLONASE) 50 MCG/ACT nasal spray Place 1 spray into both nostrils daily. 16 g 0  ? hyoscyamine (LEVBID) 0.375 MG 12 hr tablet TAKE ONE TABLET BY MOUTH TWICE A DAY 60 tablet 1  ? hyoscyamine (LEVSIN SL) 0.125 MG SL tablet DISSOLVE 1-2 TABLETS UNDER THE TONGUE EVERY FOUR HOURS AS NEEDED FOR PAIN 100 tablet 2  ? ipratropium (ATROVENT) 0.03 % nasal spray ipratropium bromide 21 mcg (0.03 %) nasal spray    ? levothyroxine (SYNTHROID) 25 MCG tablet TAKE ONE TABLET BY MOUTH EVERY DAY BEFORE BREAKFAST 90 tablet 3  ? LORazepam (ATIVAN) 0.5 MG tablet Take 1 by mouth every night at bedtime as needed for sleep 30 tablet 3  ? Lutein-Zeaxanthin 25-5 MG CAPS Take by mouth. Takes one daily    ? meclizine (ANTIVERT) 12.5 MG tablet Take 1-2 tablets (12.5-25 mg total) by mouth 3 (three) times daily as needed for dizziness. 30 tablet 0  ? meloxicam (MOBIC) 15 MG tablet TAKE 1 TABLET BY MOUTH EVERY DAY AFTER SURGERY AS NEEDED (Patient taking differently: TAKE 0.5  TABLET BY MOUTH BID) 90 tablet 2  ? methocarbamol (ROBAXIN) 500 MG tablet TAKE ONE TABLET BY MOUTH FOUR TIMES DAILY AS NEEDED 120 tablet 0  ? Multiple Vitamins-Minerals (MULTIVITAMIN,TX-MINERALS) tablet Take 1 tablet by mouth daily.      ? NON FORMULARY Vitamin C, Osteo BiFlex, Vitamin B12 - PO daily    ? Omega 3 1000 MG CAPS Take 2 capsules by mouth daily.    ? ondansetron (ZOFRAN-ODT) 4 MG disintegrating tablet Take 1 tablet (4 mg total) by mouth every 8 (eight) hours as needed for nausea or vomiting. 5 tablet 0  ? pantoprazole (PROTONIX) 40 MG tablet TAKE 1 TABLET BY MOUTH EVERY DAY 90 tablet 3  ? SUMAtriptan (IMITREX) 100 MG tablet Take 1 tablet (100 mg total) by mouth every 2 (two) hours as needed for migraine. May repeat in 2 hours if headache persists or recurs. 10 tablet 2  ? traMADol (ULTRAM) 50 MG tablet     ? triamcinolone cream (KENALOG) 0.1 %     ? benzonatate (TESSALON PERLES) 100 MG capsule 1-2 capsules up to twice daily as needed for cough 30 capsule 0  ? ?No facility-administered medications prior to visit.  ? ? ?ROS: ?Review of Systems  ?Constitutional:  Negative for activity change, appetite change, chills, fatigue and unexpected weight change.  ?  HENT:  Negative for congestion, mouth sores and sinus pressure.   ?Eyes:  Negative for visual disturbance.  ?Respiratory:  Negative for cough and chest tightness.   ?Gastrointestinal:  Negative for abdominal pain and nausea.  ?Genitourinary:  Negative for difficulty urinating, frequency and vaginal pain.  ?Musculoskeletal:  Negative for back pain and gait problem.  ?Skin:  Negative for pallor, rash and wound.  ?Neurological:  Negative for dizziness, tremors, weakness, numbness and headaches.  ?Psychiatric/Behavioral:  Negative for confusion and sleep disturbance.   ? ?Objective:  ?BP 118/70 (BP Location: Left Arm, Patient Position: Sitting, Cuff Size: Large)   Pulse 69   Temp 97.6 ?F (36.4 ?C) (Oral)   Ht '5\' 4"'$  (1.626 m)   Wt 139 lb (63 kg)   SpO2  97%   BMI 23.86 kg/m?  ? ?BP Readings from Last 3 Encounters:  ?09/30/21 118/70  ?09/16/21 (!) 142/71  ?09/04/21 104/61  ? ? ?Wt Readings from Last 3 Encounters:  ?09/30/21 139 lb (63 kg)  ?09/15/21 139 lb (63 kg)  ?09/04/21 139 lb 9.6 oz (63.3 kg)  ? ? ?Physical Exam ?Constitutional:   ?   General: She is not in acute distress. ?   Appearance: She is well-developed. She is obese.  ?HENT:  ?   Head: Normocephalic.  ?   Right Ear: External ear normal.  ?   Left Ear: External ear normal.  ?   Nose: Nose normal.  ?Eyes:  ?   General:     ?   Right eye: No discharge.     ?   Left eye: No discharge.  ?   Conjunctiva/sclera: Conjunctivae normal.  ?   Pupils: Pupils are equal, round, and reactive to light.  ?Neck:  ?   Thyroid: No thyromegaly.  ?   Vascular: No JVD.  ?   Trachea: No tracheal deviation.  ?Cardiovascular:  ?   Rate and Rhythm: Normal rate and regular rhythm.  ?   Heart sounds: Normal heart sounds.  ?Pulmonary:  ?   Effort: No respiratory distress.  ?   Breath sounds: No stridor. No wheezing.  ?Abdominal:  ?   General: Bowel sounds are normal. There is no distension.  ?   Palpations: Abdomen is soft. There is no mass.  ?   Tenderness: There is no abdominal tenderness. There is no guarding or rebound.  ?Musculoskeletal:     ?   General: No tenderness.  ?   Cervical back: Normal range of motion and neck supple. No rigidity.  ?Lymphadenopathy:  ?   Cervical: No cervical adenopathy.  ?Skin: ?   Findings: No erythema or rash.  ?Neurological:  ?   Mental Status: She is oriented to person, place, and time.  ?   Cranial Nerves: No cranial nerve deficit.  ?   Motor: No abnormal muscle tone.  ?   Coordination: Coordination normal.  ?   Deep Tendon Reflexes: Reflexes normal.  ?Psychiatric:     ?   Behavior: Behavior normal.     ?   Thought Content: Thought content normal.     ?   Judgment: Judgment normal.  ? ?30  minutes were spent on this encounter.  The time was dedicated to reviewing laboratory data, disease status  update, treatment choices and addressing complications noted diagnosis of therapy. ? ? ?Lab Results  ?Component Value Date  ? WBC 12.4 (H) 09/15/2021  ? HGB 13.2 09/15/2021  ? HCT 39.0 09/15/2021  ? PLT 382 09/15/2021  ?  GLUCOSE 114 (H) 09/15/2021  ? CHOL 168 09/03/2020  ? TRIG 54.0 09/03/2020  ? HDL 67.10 09/03/2020  ? [place] 90 09/03/2020  ? ALT 16 09/15/2021  ? AST 21 09/15/2021  ? NA 134 (L) 09/15/2021  ? K 4.8 09/15/2021  ? CL 100 09/15/2021  ? CREATININE 0.83 09/15/2021  ? BUN 15 09/15/2021  ? CO2 26 09/15/2021  ? TSH 3.27 07/29/2021  ? INR 0.90 08/05/2009  ? HGBA1C 5.8 05/08/2016  ? ? ?CT Head Wo Contrast ? ?Result Date: 09/15/2021 ?CLINICAL DATA:  Sudden onset of severe headache, new or worsening. EXAM: CT HEAD WITHOUT CONTRAST TECHNIQUE: Contiguous axial images were obtained from the base of the skull through the vertex without intravenous contrast. RADIATION DOSE REDUCTION: This exam was performed according to the departmental dose-optimization program which includes automated exposure control, adjustment of the mA and/or kV according to patient size and/or use of iterative reconstruction technique. COMPARISON:  Head CT and brain MRI 06/20/2019 FINDINGS: Brain: No intracranial hemorrhage, mass effect, or midline shift. No hydrocephalus. The basilar cisterns are patent. Brain volume is normal for age. No evidence of territorial infarct or acute ischemia. No extra-axial or intracranial fluid collection. Vascular: Atherosclerosis of skullbase vasculature without hyperdense vessel or abnormal calcification. Skull: No fracture or focal lesion. Sinuses/Orbits: Chronic opacification of posterior lower left mastoid air cells. Paranasal sinuses are clear. No orbital abnormalities. Other: None. IMPRESSION: 1. No acute intracranial abnormality. 2. Chronic opacification of posterior lower left mastoid air cells. Electronically Signed   By: Keith Rake M.D.   On: 09/15/2021 18:11  ? ?MR ANGIO HEAD WO  CONTRAST ? ?Result Date: 09/15/2021 ?CLINICAL DATA:  Dizziness and headache EXAM: MRI HEAD WITHOUT CONTRAST MRA HEAD WITHOUT CONTRAST MRA OF THE NECK WITHOUT AND WITH CONTRAST TECHNIQUE: Multiplanar, multi-echo pulse sequ

## 2021-09-30 NOTE — Assessment & Plan Note (Signed)
Worse ?Blue-Emu cream was recommended to use 2-3 times a day ? ?

## 2021-09-30 NOTE — Patient Instructions (Signed)
Blue-Emu cream -- use 2-3 times a day ? ?

## 2021-09-30 NOTE — Assessment & Plan Note (Signed)
3/23 post-COVID ?ER note was reviewed ?

## 2021-09-30 NOTE — Assessment & Plan Note (Signed)
New ?Haley Armstrong had COVID 19 in March 2023 - post-covid sinusitis ?Star Augmentin ?

## 2021-09-30 NOTE — Assessment & Plan Note (Signed)
Haley Armstrong had COVID 19 in March 2023 ?

## 2021-09-30 NOTE — Assessment & Plan Note (Signed)
CT 3/23: Chronic opacification of posterior lower left mastoid air cells. ?Sx's are worse ?Empiric ABX: augmentin and probiotic ?

## 2021-10-08 DIAGNOSIS — M542 Cervicalgia: Secondary | ICD-10-CM | POA: Diagnosis not present

## 2021-10-08 DIAGNOSIS — M5412 Radiculopathy, cervical region: Secondary | ICD-10-CM | POA: Diagnosis not present

## 2021-10-22 ENCOUNTER — Other Ambulatory Visit: Payer: Self-pay | Admitting: Internal Medicine

## 2021-10-29 ENCOUNTER — Encounter: Payer: Self-pay | Admitting: Internal Medicine

## 2021-10-29 ENCOUNTER — Ambulatory Visit (INDEPENDENT_AMBULATORY_CARE_PROVIDER_SITE_OTHER): Payer: Medicare Other | Admitting: Internal Medicine

## 2021-10-29 DIAGNOSIS — M545 Low back pain, unspecified: Secondary | ICD-10-CM | POA: Diagnosis not present

## 2021-10-29 DIAGNOSIS — M2550 Pain in unspecified joint: Secondary | ICD-10-CM | POA: Diagnosis not present

## 2021-10-29 MED ORDER — METHOCARBAMOL 500 MG PO TABS: 500.0000 mg | ORAL_TABLET | Freq: Four times a day (QID) | ORAL | 3 refills | Status: DC | PRN

## 2021-10-29 NOTE — Assessment & Plan Note (Signed)
Cont on Tramadol prn, Methocarbamol prn ? Potential benefits of a short/long term opioids use as well as potential risks (i.e. addiction risk, apnea etc) and complications (i.e. Somnolence, constipation and others) were explained to the patient and were aknowledged. ?Seeing Dr Ellene Route ?Blue-Emu cream was recommended to use 2-3 times a day ? ?

## 2021-10-29 NOTE — Assessment & Plan Note (Signed)
Tramadol prn - rare use ? Potential benefits of a long term opioids use as well as potential risks (i.e. addiction risk, apnea etc) and complications (i.e. Somnolence, constipation and others) were explained to the patient and were aknowledged. ?Blue-Emu cream was recommended to use 2-3 times a day ?

## 2021-10-29 NOTE — Progress Notes (Signed)
? ?Subjective:  ?Patient ID: Haley Armstrong, female    DOB: 1945/12/13  Age: 76 y.o. MRN: [id number] ? ?CC: No chief complaint on file. ? ? ?HPI ?Haley Armstrong presents for dizziness - better after ABX. ?F/u neck pain - Haley Armstrong saw Dr Ellene Route, GERD, anxiety ? ? ?Outpatient Medications Prior to Visit  ?Medication Sig Dispense Refill  ? amLODipine (NORVASC) 2.5 MG tablet Take 1 tablet (2.5 mg total) by mouth daily. 90 tablet 3  ? Apoaequorin (PREVAGEN) 10 MG CAPS Take by mouth.    ? buPROPion (WELLBUTRIN XL) 150 MG 24 hr tablet TAKE 1 TABLET (150 MG TOTAL) BY MOUTH DAILY. TAKE IN AM 90 tablet 2  ? Cholecalciferol (VITAMIN D3) 50 MCG (2000 UT) capsule Take 1 capsule (2,000 Units total) by mouth daily. 100 capsule 3  ? diclofenac sodium (VOLTAREN) 1 % GEL Apply 4 g topically 4 (four) times daily. 100 Tube 11  ? enalapril (VASOTEC) 5 MG tablet TAKE 1 TABLET 2 TIMES DAILY 180 tablet 3  ? fluticasone (FLONASE) 50 MCG/ACT nasal spray Place 1 spray into both nostrils daily. 16 g 0  ? hyoscyamine (LEVBID) 0.375 MG 12 hr tablet TAKE ONE TABLET BY MOUTH TWICE A DAY 60 tablet 1  ? hyoscyamine (LEVSIN SL) 0.125 MG SL tablet DISSOLVE 1-2 TABLETS UNDER THE TONGUE EVERY FOUR HOURS AS NEEDED FOR PAIN 100 tablet 2  ? ipratropium (ATROVENT) 0.03 % nasal spray ipratropium bromide 21 mcg (0.03 %) nasal spray    ? levothyroxine (SYNTHROID) 25 MCG tablet TAKE ONE TABLET BY MOUTH EVERY DAY BEFORE BREAKFAST 90 tablet 3  ? LORazepam (ATIVAN) 0.5 MG tablet Take 1 by mouth every night at bedtime as needed for sleep 30 tablet 3  ? Lutein-Zeaxanthin 25-5 MG CAPS Take by mouth. Takes one daily    ? meclizine (ANTIVERT) 12.5 MG tablet Take 1-2 tablets (12.5-25 mg total) by mouth 3 (three) times daily as needed for dizziness. 30 tablet 0  ? meloxicam (MOBIC) 15 MG tablet TAKE 1 TABLET BY MOUTH EVERY DAY AFTER SURGERY AS NEEDED (Patient taking differently: TAKE 0.5 TABLET BY MOUTH BID) 90 tablet 2  ? Multiple Vitamins-Minerals  (MULTIVITAMIN,TX-MINERALS) tablet Take 1 tablet by mouth daily.      ? NON FORMULARY Vitamin C, Osteo BiFlex, Vitamin B12 - PO daily    ? Omega 3 1000 MG CAPS Take 2 capsules by mouth daily.    ? ondansetron (ZOFRAN-ODT) 4 MG disintegrating tablet Take 1 tablet (4 mg total) by mouth every 8 (eight) hours as needed for nausea or vomiting. 5 tablet 0  ? pantoprazole (PROTONIX) 40 MG tablet TAKE 1 TABLET BY MOUTH EVERY DAY 90 tablet 3  ? SUMAtriptan (IMITREX) 100 MG tablet Take 1 tablet (100 mg total) by mouth every 2 (two) hours as needed for migraine. May repeat in 2 hours if headache persists or recurs. 10 tablet 2  ? traMADol (ULTRAM) 50 MG tablet     ? triamcinolone cream (KENALOG) 0.1 %     ? methocarbamol (ROBAXIN) 500 MG tablet TAKE ONE TABLET BY MOUTH FOUR TIMES DAILY AS NEEDED 120 tablet 0  ? amoxicillin-clavulanate (AUGMENTIN) 875-125 MG tablet Take 1 tablet by mouth 2 (two) times daily. 28 tablet 0  ? ?No facility-administered medications prior to visit.  ? ? ?ROS: ?Review of Systems  ?Constitutional:  Negative for activity change, appetite change, chills, fatigue and unexpected weight change.  ?HENT:  Negative for congestion, mouth sores, postnasal drip, sinus pressure and tinnitus.   ?  Eyes:  Negative for visual disturbance.  ?Respiratory:  Negative for cough, chest tightness and wheezing.   ?Cardiovascular:  Negative for palpitations.  ?Gastrointestinal:  Negative for abdominal pain and nausea.  ?Genitourinary:  Negative for difficulty urinating, frequency and vaginal pain.  ?Musculoskeletal:  Negative for back pain and gait problem.  ?Skin:  Negative for pallor and rash.  ?Neurological:  Negative for dizziness, tremors, weakness, light-headedness, numbness and headaches.  ?Psychiatric/Behavioral:  Negative for confusion and sleep disturbance.   ? ?Objective:  ?BP (!) 144/88 (BP Location: Left Arm, Patient Position: Sitting, Cuff Size: Normal)   Pulse 72   Temp 97.6 ?F (36.4 ?C) (Oral)   Ht '5\' 4"'$   (1.626 m)   Wt 142 lb (64.4 kg)   SpO2 98%   BMI 24.37 kg/m?  ? ?BP Readings from Last 3 Encounters:  ?10/29/21 (!) 144/88  ?09/30/21 118/70  ?09/16/21 (!) 142/71  ? ? ?Wt Readings from Last 3 Encounters:  ?10/29/21 142 lb (64.4 kg)  ?09/30/21 139 lb (63 kg)  ?09/15/21 139 lb (63 kg)  ? ? ?Physical Exam ?Constitutional:   ?   General: She is not in acute distress. ?   Appearance: She is well-developed.  ?HENT:  ?   Head: Normocephalic.  ?   Right Ear: External ear normal.  ?   Left Ear: External ear normal.  ?   Nose: Nose normal.  ?Eyes:  ?   General:     ?   Right eye: No discharge.     ?   Left eye: No discharge.  ?   Conjunctiva/sclera: Conjunctivae normal.  ?   Pupils: Pupils are equal, round, and reactive to light.  ?Neck:  ?   Thyroid: No thyromegaly.  ?   Vascular: No JVD.  ?   Trachea: No tracheal deviation.  ?Cardiovascular:  ?   Rate and Rhythm: Normal rate and regular rhythm.  ?   Heart sounds: Normal heart sounds.  ?Pulmonary:  ?   Effort: No respiratory distress.  ?   Breath sounds: No stridor. No wheezing.  ?Abdominal:  ?   General: Bowel sounds are normal. There is no distension.  ?   Palpations: Abdomen is soft. There is no mass.  ?   Tenderness: There is no abdominal tenderness. There is no guarding or rebound.  ?Musculoskeletal:     ?   General: No tenderness.  ?   Cervical back: Normal range of motion and neck supple. No rigidity.  ?Lymphadenopathy:  ?   Cervical: No cervical adenopathy.  ?Skin: ?   Findings: No erythema or rash.  ?Neurological:  ?   Cranial Nerves: No cranial nerve deficit.  ?   Motor: No weakness or abnormal muscle tone.  ?   Coordination: Coordination normal.  ?   Deep Tendon Reflexes: Reflexes normal.  ?Psychiatric:     ?   Behavior: Behavior normal.     ?   Thought Content: Thought content normal.     ?   Judgment: Judgment normal.  ? ? ?Lab Results  ?Component Value Date  ? WBC 12.4 (H) 09/15/2021  ? HGB 13.2 09/15/2021  ? HCT 39.0 09/15/2021  ? PLT 382 09/15/2021  ?  GLUCOSE 114 (H) 09/15/2021  ? CHOL 168 09/03/2020  ? TRIG 54.0 09/03/2020  ? HDL 67.10 09/03/2020  ? [place] 90 09/03/2020  ? ALT 16 09/15/2021  ? AST 21 09/15/2021  ? NA 134 (L) 09/15/2021  ? K 4.8 09/15/2021  ? CL  100 09/15/2021  ? CREATININE 0.83 09/15/2021  ? BUN 15 09/15/2021  ? CO2 26 09/15/2021  ? TSH 3.27 07/29/2021  ? INR 0.90 08/05/2009  ? HGBA1C 5.8 05/08/2016  ? ? ?CT Head Wo Contrast ? ?Result Date: 09/15/2021 ?CLINICAL DATA:  Sudden onset of severe headache, new or worsening. EXAM: CT HEAD WITHOUT CONTRAST TECHNIQUE: Contiguous axial images were obtained from the base of the skull through the vertex without intravenous contrast. RADIATION DOSE REDUCTION: This exam was performed according to the departmental dose-optimization program which includes automated exposure control, adjustment of the mA and/or kV according to patient size and/or use of iterative reconstruction technique. COMPARISON:  Head CT and brain MRI 06/20/2019 FINDINGS: Brain: No intracranial hemorrhage, mass effect, or midline shift. No hydrocephalus. The basilar cisterns are patent. Brain volume is normal for age. No evidence of territorial infarct or acute ischemia. No extra-axial or intracranial fluid collection. Vascular: Atherosclerosis of skullbase vasculature without hyperdense vessel or abnormal calcification. Skull: No fracture or focal lesion. Sinuses/Orbits: Chronic opacification of posterior lower left mastoid air cells. Paranasal sinuses are clear. No orbital abnormalities. Other: None. IMPRESSION: 1. No acute intracranial abnormality. 2. Chronic opacification of posterior lower left mastoid air cells. Electronically Signed   By: Keith Rake M.D.   On: 09/15/2021 18:11  ? ?MR ANGIO HEAD WO CONTRAST ? ?Result Date: 09/15/2021 ?CLINICAL DATA:  Dizziness and headache EXAM: MRI HEAD WITHOUT CONTRAST MRA HEAD WITHOUT CONTRAST MRA OF THE NECK WITHOUT AND WITH CONTRAST TECHNIQUE: Multiplanar, multi-echo pulse sequences of the  brain and surrounding structures were acquired without intravenous contrast. Angiographic images of the Circle of Willis were acquired using MRA technique without intravenous contrast. Angiographic images of the neck were acquired using

## 2021-11-19 ENCOUNTER — Other Ambulatory Visit: Payer: Self-pay | Admitting: Internal Medicine

## 2021-11-26 ENCOUNTER — Telehealth: Payer: Self-pay | Admitting: Internal Medicine

## 2021-11-26 ENCOUNTER — Other Ambulatory Visit: Payer: Self-pay | Admitting: Internal Medicine

## 2021-11-26 NOTE — Telephone Encounter (Signed)
Called pt to schedule AWV with [facility]. Patient stated she will call me back to schedule.

## 2021-12-09 ENCOUNTER — Other Ambulatory Visit: Payer: Self-pay | Admitting: Internal Medicine

## 2021-12-10 DIAGNOSIS — D225 Melanocytic nevi of trunk: Secondary | ICD-10-CM | POA: Diagnosis not present

## 2021-12-10 DIAGNOSIS — L570 Actinic keratosis: Secondary | ICD-10-CM | POA: Diagnosis not present

## 2021-12-10 DIAGNOSIS — L821 Other seborrheic keratosis: Secondary | ICD-10-CM | POA: Diagnosis not present

## 2021-12-10 DIAGNOSIS — X32XXXD Exposure to sunlight, subsequent encounter: Secondary | ICD-10-CM | POA: Diagnosis not present

## 2021-12-17 DIAGNOSIS — N8110 Cystocele, unspecified: Secondary | ICD-10-CM | POA: Diagnosis not present

## 2021-12-17 DIAGNOSIS — Z4689 Encounter for fitting and adjustment of other specified devices: Secondary | ICD-10-CM | POA: Diagnosis not present

## 2021-12-21 ENCOUNTER — Other Ambulatory Visit: Payer: Self-pay | Admitting: Internal Medicine

## 2022-02-05 ENCOUNTER — Telehealth: Payer: Self-pay

## 2022-02-05 MED ORDER — MECLIZINE HCL 12.5 MG PO TABS: 12.5000 mg | ORAL_TABLET | Freq: Three times a day (TID) | ORAL | 0 refills | Status: AC | PRN

## 2022-02-05 NOTE — Telephone Encounter (Signed)
Sent rx to [facility] 

## 2022-02-05 NOTE — Telephone Encounter (Signed)
Pt is requesting a refill: meclizine (ANTIVERT) 12.5 MG tablet  Pt was given this Rx from ER and have been taking this PRN.   Pharmacy: [id number] [place], [place] 10/29/21 ROV 03/04/22

## 2022-02-13 ENCOUNTER — Ambulatory Visit: Payer: Medicare Other | Admitting: Podiatry

## 2022-03-04 ENCOUNTER — Ambulatory Visit: Payer: Medicare Other | Admitting: Internal Medicine

## 2022-03-10 ENCOUNTER — Other Ambulatory Visit: Payer: Self-pay | Admitting: Internal Medicine

## 2022-03-11 ENCOUNTER — Ambulatory Visit (INDEPENDENT_AMBULATORY_CARE_PROVIDER_SITE_OTHER): Payer: Medicare Other | Admitting: Internal Medicine

## 2022-03-11 ENCOUNTER — Encounter: Payer: Self-pay | Admitting: Internal Medicine

## 2022-03-11 DIAGNOSIS — F413 Other mixed anxiety disorders: Secondary | ICD-10-CM | POA: Diagnosis not present

## 2022-03-11 DIAGNOSIS — R42 Dizziness and giddiness: Secondary | ICD-10-CM | POA: Diagnosis not present

## 2022-03-11 DIAGNOSIS — M545 Low back pain, unspecified: Secondary | ICD-10-CM | POA: Diagnosis not present

## 2022-03-11 DIAGNOSIS — I10 Essential (primary) hypertension: Secondary | ICD-10-CM

## 2022-03-11 DIAGNOSIS — M1990 Unspecified osteoarthritis, unspecified site: Secondary | ICD-10-CM

## 2022-03-11 MED ORDER — TRAMADOL HCL 50 MG PO TABS: ORAL_TABLET | ORAL | 1 refills | Status: DC

## 2022-03-11 NOTE — Assessment & Plan Note (Addendum)
Blue-Emu cream was recommended to use 2-3 times a day Tramadol prn  Potential benefits of a long term opioids use as well as potential risks (i.e. addiction risk, apnea etc) and complications (i.e. Somnolence, constipation and others) were explained to the patient and were aknowledged.

## 2022-03-11 NOTE — Progress Notes (Signed)
Subjective:  Patient ID: Haley Armstrong, female    DOB: [birth year]  Age: 76 y.o. MRN: [id number]  CC: Follow-up (4 month f/u)   HPI Haley Armstrong presents for hearing loss, occ dizziness, OA, depression Working 45 h/wk  Outpatient Medications Prior to Visit  Medication Sig Dispense Refill   amLODipine (NORVASC) 2.5 MG tablet TAKE 1 TABLET BY MOUTH EVERY DAY 90 tablet 1   Apoaequorin (PREVAGEN) 10 MG CAPS Take by mouth.     buPROPion (WELLBUTRIN XL) 150 MG 24 hr tablet TAKE 1 TABLET (150 MG TOTAL) BY MOUTH DAILY. TAKE IN AM 90 tablet 2   Cholecalciferol (VITAMIN D3) 50 MCG (2000 UT) capsule Take 1 capsule (2,000 Units total) by mouth daily. 100 capsule 3   diclofenac sodium (VOLTAREN) 1 % GEL Apply 4 g topically 4 (four) times daily. 100 Tube 11   enalapril (VASOTEC) 5 MG tablet TAKE 1 TABLET BY MOUTH TWICE A DAY 180 tablet 1   fluticasone (FLONASE) 50 MCG/ACT nasal spray Place 1 spray into both nostrils daily. 16 g 0   hyoscyamine (LEVBID) 0.375 MG 12 hr tablet TAKE ONE TABLET BY MOUTH TWICE A DAY 60 tablet 1   hyoscyamine (LEVSIN SL) 0.125 MG SL tablet DISSOLVE 1-2 TABLETS UNDER THE TONGUE EVERY FOUR HOURS AS NEEDED FOR PAIN 100 tablet 2   ipratropium (ATROVENT) 0.03 % nasal spray ipratropium bromide 21 mcg (0.03 %) nasal spray     levothyroxine (SYNTHROID) 25 MCG tablet TAKE ONE TABLET BY MOUTH EVERY DAY BEFORE BREAKFAST 90 tablet 1   LORazepam (ATIVAN) 0.5 MG tablet Take 1 by mouth every night at bedtime as needed for sleep 30 tablet 3   Lutein-Zeaxanthin 25-5 MG CAPS Take by mouth. Takes one daily     meclizine (ANTIVERT) 12.5 MG tablet Take 1-2 tablets (12.5-25 mg total) by mouth 3 (three) times daily as needed for dizziness. 30 tablet 0   meloxicam (MOBIC) 15 MG tablet TAKE 1 TABLET BY MOUTH EVERY DAY AFTER SURGERY AS NEEDED 90 tablet 0   methocarbamol (ROBAXIN) 500 MG tablet Take 1 tablet (500 mg total) by mouth every 6 (six) hours as needed. 120 tablet 3   Multiple  Vitamins-Minerals (MULTIVITAMIN,TX-MINERALS) tablet Take 1 tablet by mouth daily.       NON FORMULARY Vitamin C, Osteo BiFlex, Vitamin B12 - PO daily     Omega 3 1000 MG CAPS Take 2 capsules by mouth daily.     ondansetron (ZOFRAN-ODT) 4 MG disintegrating tablet Take 1 tablet (4 mg total) by mouth every 8 (eight) hours as needed for nausea or vomiting. 5 tablet 0   pantoprazole (PROTONIX) 40 MG tablet TAKE 1 TABLET BY MOUTH EVERY DAY 90 tablet 3   SUMAtriptan (IMITREX) 100 MG tablet Take 1 tablet (100 mg total) by mouth every 2 (two) hours as needed for migraine. May repeat in 2 hours if headache persists or recurs. 10 tablet 2   traMADol (ULTRAM) 50 MG tablet      triamcinolone cream (KENALOG) 0.1 %      No facility-administered medications prior to visit.    ROS: Review of Systems  Constitutional:  Negative for activity change, appetite change, chills, fatigue and unexpected weight change.  HENT:  Negative for congestion, mouth sores and sinus pressure.   Eyes:  Negative for visual disturbance.  Respiratory:  Negative for cough and chest tightness.   Gastrointestinal:  Negative for abdominal pain and nausea.  Genitourinary:  Negative for difficulty urinating, frequency and  vaginal pain.  Musculoskeletal:  Positive for arthralgias, back pain and neck pain. Negative for gait problem.  Skin:  Negative for pallor and rash.  Neurological:  Negative for dizziness, tremors, weakness, numbness and headaches.  Psychiatric/Behavioral:  Negative for confusion, sleep disturbance and suicidal ideas. The patient is nervous/anxious.     Objective:  BP (!) 142/62 (BP Location: Left Arm)   Pulse 76   Temp 98.2 F (36.8 C) (Oral)   Ht '5\' 4"'$  (1.626 m)   Wt 141 lb 3.2 oz (64 kg)   SpO2 98%   BMI 24.24 kg/m   BP Readings from Last 3 Encounters:  03/11/22 (!) 142/62  10/29/21 (!) 144/88  09/30/21 118/70    Wt Readings from Last 3 Encounters:  03/11/22 141 lb 3.2 oz (64 kg)  10/29/21 142 lb  (64.4 kg)  09/30/21 139 lb (63 kg)    Physical Exam Constitutional:      General: She is not in acute distress.    Appearance: She is well-developed.  HENT:     Head: Normocephalic.     Right Ear: External ear normal.     Left Ear: External ear normal.     Nose: Nose normal.  Eyes:     General:        Right eye: No discharge.        Left eye: No discharge.     Conjunctiva/sclera: Conjunctivae normal.     Pupils: Pupils are equal, round, and reactive to light.  Neck:     Thyroid: No thyromegaly.     Vascular: No JVD.     Trachea: No tracheal deviation.  Cardiovascular:     Rate and Rhythm: Normal rate and regular rhythm.     Heart sounds: Normal heart sounds.  Pulmonary:     Effort: No respiratory distress.     Breath sounds: No stridor. No wheezing.  Abdominal:     General: Bowel sounds are normal. There is no distension.     Palpations: Abdomen is soft. There is no mass.     Tenderness: There is no abdominal tenderness. There is no guarding or rebound.  Musculoskeletal:        General: Tenderness present.     Cervical back: Normal range of motion and neck supple. No rigidity.     Right lower leg: No edema.     Left lower leg: No edema.  Lymphadenopathy:     Cervical: No cervical adenopathy.  Skin:    Findings: No erythema or rash.  Neurological:     Cranial Nerves: No cranial nerve deficit.     Motor: No abnormal muscle tone.     Coordination: Coordination normal.     Gait: Gait normal.     Deep Tendon Reflexes: Reflexes normal.  Psychiatric:        Behavior: Behavior normal.        Thought Content: Thought content normal.        Judgment: Judgment normal.     Lab Results  Component Value Date   WBC 12.4 (H) 09/15/2021   HGB 13.2 09/15/2021   HCT 39.0 09/15/2021   PLT 382 09/15/2021   GLUCOSE 114 (H) 09/15/2021   CHOL 168 09/03/2020   TRIG 54.0 09/03/2020   HDL 67.10 09/03/2020   LDLCALC 90 09/03/2020   ALT 16 09/15/2021   AST 21 09/15/2021   NA  134 (L) 09/15/2021   K 4.8 09/15/2021   CL 100 09/15/2021   CREATININE 0.83 09/15/2021  BUN 15 09/15/2021   CO2 26 09/15/2021   TSH 3.27 07/29/2021   INR 0.90 08/05/2009   HGBA1C 5.8 05/08/2016    MR BRAIN WO CONTRAST  Result Date: 09/15/2021 CLINICAL DATA:  Dizziness and headache EXAM: MRI HEAD WITHOUT CONTRAST MRA HEAD WITHOUT CONTRAST MRA OF THE NECK WITHOUT AND WITH CONTRAST TECHNIQUE: Multiplanar, multi-echo pulse sequences of the brain and surrounding structures were acquired without intravenous contrast. Angiographic images of the Circle of Willis were acquired using MRA technique without intravenous contrast. Angiographic images of the neck were acquired using MRA technique without and with intravenous contrast. Carotid stenosis measurements (when applicable) are obtained utilizing NASCET criteria, using the distal internal carotid diameter as the denominator. CONTRAST:  6.51m GADAVIST GADOBUTROL 1 MMOL/ML IV SOLN COMPARISON:  No pertinent prior exam. FINDINGS: MR HEAD FINDINGS Brain: No acute infarct, mass effect or extra-axial collection. No acute or chronic hemorrhage. Normal white matter signal, parenchymal volume and CSF spaces. The midline structures are normal. Vascular: Major flow voids are preserved. Skull and upper cervical spine: Normal calvarium and skull base. Visualized upper cervical spine and soft tissues are normal. Sinuses/Orbits:No paranasal sinus fluid levels or advanced mucosal thickening. No mastoid or middle ear effusion. Normal orbits. MRA HEAD FINDINGS POSTERIOR CIRCULATION: --Vertebral arteries: Normal --Inferior cerebellar arteries: Normal. --Basilar artery: Normal. --Superior cerebellar arteries: Normal. --Posterior cerebral arteries: Mild stenosis at the left P1-2 junction. Otherwise normal. ANTERIOR CIRCULATION: --Intracranial internal carotid arteries: Normal. --Anterior cerebral arteries (ACA): Normal. --Middle cerebral arteries (MCA): Normal. ANATOMIC VARIANTS:  None MRA NECK FINDINGS Aortic arch: Normal Right carotid system: Normal Left carotid system: Normal Vertebral arteries: Codominant system.  Normal. Other: None. IMPRESSION: 1. Normal MRI of the brain. 2. Mild stenosis at the left PCA P1-2 junction. 3. Normal MRA of the neck. Electronically Signed   By: KUlyses JarredM.D.   On: 09/15/2021 22:56   MR ANGIO HEAD WO CONTRAST  Result Date: 09/15/2021 CLINICAL DATA:  Dizziness and headache EXAM: MRI HEAD WITHOUT CONTRAST MRA HEAD WITHOUT CONTRAST MRA OF THE NECK WITHOUT AND WITH CONTRAST TECHNIQUE: Multiplanar, multi-echo pulse sequences of the brain and surrounding structures were acquired without intravenous contrast. Angiographic images of the Circle of Willis were acquired using MRA technique without intravenous contrast. Angiographic images of the neck were acquired using MRA technique without and with intravenous contrast. Carotid stenosis measurements (when applicable) are obtained utilizing NASCET criteria, using the distal internal carotid diameter as the denominator. CONTRAST:  6.556mGADAVIST GADOBUTROL 1 MMOL/ML IV SOLN COMPARISON:  No pertinent prior exam. FINDINGS: MR HEAD FINDINGS Brain: No acute infarct, mass effect or extra-axial collection. No acute or chronic hemorrhage. Normal white matter signal, parenchymal volume and CSF spaces. The midline structures are normal. Vascular: Major flow voids are preserved. Skull and upper cervical spine: Normal calvarium and skull base. Visualized upper cervical spine and soft tissues are normal. Sinuses/Orbits:No paranasal sinus fluid levels or advanced mucosal thickening. No mastoid or middle ear effusion. Normal orbits. MRA HEAD FINDINGS POSTERIOR CIRCULATION: --Vertebral arteries: Normal --Inferior cerebellar arteries: Normal. --Basilar artery: Normal. --Superior cerebellar arteries: Normal. --Posterior cerebral arteries: Mild stenosis at the left P1-2 junction. Otherwise normal. ANTERIOR CIRCULATION:  --Intracranial internal carotid arteries: Normal. --Anterior cerebral arteries (ACA): Normal. --Middle cerebral arteries (MCA): Normal. ANATOMIC VARIANTS: None MRA NECK FINDINGS Aortic arch: Normal Right carotid system: Normal Left carotid system: Normal Vertebral arteries: Codominant system.  Normal. Other: None. IMPRESSION: 1. Normal MRI of the brain. 2. Mild stenosis at the left PCA P1-2 junction. 3.  Normal MRA of the neck. Electronically Signed   By: Ulyses Jarred M.D.   On: 09/15/2021 22:56   MR Angiogram Neck W or Wo Contrast  Result Date: 09/15/2021 CLINICAL DATA:  Dizziness and headache EXAM: MRI HEAD WITHOUT CONTRAST MRA HEAD WITHOUT CONTRAST MRA OF THE NECK WITHOUT AND WITH CONTRAST TECHNIQUE: Multiplanar, multi-echo pulse sequences of the brain and surrounding structures were acquired without intravenous contrast. Angiographic images of the Circle of Willis were acquired using MRA technique without intravenous contrast. Angiographic images of the neck were acquired using MRA technique without and with intravenous contrast. Carotid stenosis measurements (when applicable) are obtained utilizing NASCET criteria, using the distal internal carotid diameter as the denominator. CONTRAST:  6.26m GADAVIST GADOBUTROL 1 MMOL/ML IV SOLN COMPARISON:  No pertinent prior exam. FINDINGS: MR HEAD FINDINGS Brain: No acute infarct, mass effect or extra-axial collection. No acute or chronic hemorrhage. Normal white matter signal, parenchymal volume and CSF spaces. The midline structures are normal. Vascular: Major flow voids are preserved. Skull and upper cervical spine: Normal calvarium and skull base. Visualized upper cervical spine and soft tissues are normal. Sinuses/Orbits:No paranasal sinus fluid levels or advanced mucosal thickening. No mastoid or middle ear effusion. Normal orbits. MRA HEAD FINDINGS POSTERIOR CIRCULATION: --Vertebral arteries: Normal --Inferior cerebellar arteries: Normal. --Basilar artery:  Normal. --Superior cerebellar arteries: Normal. --Posterior cerebral arteries: Mild stenosis at the left P1-2 junction. Otherwise normal. ANTERIOR CIRCULATION: --Intracranial internal carotid arteries: Normal. --Anterior cerebral arteries (ACA): Normal. --Middle cerebral arteries (MCA): Normal. ANATOMIC VARIANTS: None MRA NECK FINDINGS Aortic arch: Normal Right carotid system: Normal Left carotid system: Normal Vertebral arteries: Codominant system.  Normal. Other: None. IMPRESSION: 1. Normal MRI of the brain. 2. Mild stenosis at the left PCA P1-2 junction. 3. Normal MRA of the neck. Electronically Signed   By: KUlyses JarredM.D.   On: 09/15/2021 22:56   CT Head Wo Contrast  Result Date: 09/15/2021 CLINICAL DATA:  Sudden onset of severe headache, new or worsening. EXAM: CT HEAD WITHOUT CONTRAST TECHNIQUE: Contiguous axial images were obtained from the base of the skull through the vertex without intravenous contrast. RADIATION DOSE REDUCTION: This exam was performed according to the departmental dose-optimization program which includes automated exposure control, adjustment of the mA and/or kV according to patient size and/or use of iterative reconstruction technique. COMPARISON:  Head CT and brain MRI 06/20/2019 FINDINGS: Brain: No intracranial hemorrhage, mass effect, or midline shift. No hydrocephalus. The basilar cisterns are patent. Brain volume is normal for age. No evidence of territorial infarct or acute ischemia. No extra-axial or intracranial fluid collection. Vascular: Atherosclerosis of skullbase vasculature without hyperdense vessel or abnormal calcification. Skull: No fracture or focal lesion. Sinuses/Orbits: Chronic opacification of posterior lower left mastoid air cells. Paranasal sinuses are clear. No orbital abnormalities. Other: None. IMPRESSION: 1. No acute intracranial abnormality. 2. Chronic opacification of posterior lower left mastoid air cells. Electronically Signed   By: MKeith RakeM.D.   On: 09/15/2021 18:11    Assessment & Plan:   Problem List Items Addressed This Visit     Anxiety disorder     Potential benefits of a long term benzodiazepines  use as well as potential risks  and complications were explained to the patient and were aknowledged.  Stress reduction discussed      Essential hypertension    Cont on Amlodipine, Enalapril      LOW BACK PAIN    Blue-Emu cream was recommended to use 2-3 times a day Tramadol prn  Potential benefits of a long term opioids use as well as potential risks (i.e. addiction risk, apnea etc) and complications (i.e. Somnolence, constipation and others) were explained to the patient and were aknowledged.      Osteoarthritis    Tramadol prn  Potential benefits of a long term opioids use as well as potential risks (i.e. addiction risk, apnea etc) and complications (i.e. Somnolence, constipation and others) were explained to the patient and were aknowledged.       Vertigo    Hearing test at [facility] Meclizine prn         No orders of the defined types were placed in this encounter.     Follow-up: Return in about 3 months (around 06/10/2022) for a follow-up visit.  Walker Kehr, MD

## 2022-03-11 NOTE — Assessment & Plan Note (Signed)
Cont on Amlodipine, Enalapril 

## 2022-03-11 NOTE — Assessment & Plan Note (Signed)
Potential benefits of a long term benzodiazepines  use as well as potential risks  and complications were explained to the patient and were aknowledged.  Stress reduction discussed

## 2022-03-11 NOTE — Assessment & Plan Note (Signed)
Tramadol prn ° Potential benefits of a long term opioids use as well as potential risks (i.e. addiction risk, apnea etc) and complications (i.e. Somnolence, constipation and others) were explained to the patient and were aknowledged. ° ° °

## 2022-03-11 NOTE — Assessment & Plan Note (Signed)
Hearing test at [facility] Meclizine prn

## 2022-04-14 DIAGNOSIS — N8110 Cystocele, unspecified: Secondary | ICD-10-CM | POA: Diagnosis not present

## 2022-04-14 DIAGNOSIS — Z4689 Encounter for fitting and adjustment of other specified devices: Secondary | ICD-10-CM | POA: Diagnosis not present

## 2022-04-17 ENCOUNTER — Other Ambulatory Visit: Payer: Self-pay | Admitting: Internal Medicine

## 2022-05-06 ENCOUNTER — Encounter: Payer: Self-pay | Admitting: Internal Medicine

## 2022-05-14 ENCOUNTER — Other Ambulatory Visit: Payer: Self-pay | Admitting: Internal Medicine

## 2022-05-17 ENCOUNTER — Other Ambulatory Visit: Payer: Self-pay | Admitting: Internal Medicine

## 2022-05-24 ENCOUNTER — Other Ambulatory Visit: Payer: Self-pay | Admitting: Internal Medicine

## 2022-05-29 ENCOUNTER — Encounter: Payer: Self-pay | Admitting: Podiatry

## 2022-05-29 ENCOUNTER — Ambulatory Visit: Payer: Medicare Other | Admitting: Podiatry

## 2022-05-29 ENCOUNTER — Ambulatory Visit (INDEPENDENT_AMBULATORY_CARE_PROVIDER_SITE_OTHER): Payer: Medicare Other

## 2022-05-29 DIAGNOSIS — M779 Enthesopathy, unspecified: Secondary | ICD-10-CM | POA: Diagnosis not present

## 2022-05-29 DIAGNOSIS — M778 Other enthesopathies, not elsewhere classified: Secondary | ICD-10-CM | POA: Diagnosis not present

## 2022-05-29 DIAGNOSIS — M7751 Other enthesopathy of right foot: Secondary | ICD-10-CM

## 2022-05-29 DIAGNOSIS — M2022 Hallux rigidus, left foot: Secondary | ICD-10-CM

## 2022-05-29 MED ORDER — TRIAMCINOLONE ACETONIDE 10 MG/ML IJ SUSP
20.0000 mg | Freq: Once | INTRAMUSCULAR | Status: AC
  Administered 2022-05-29: 20 mg

## 2022-05-29 NOTE — Progress Notes (Signed)
Subjective:   Patient ID: Haley Armstrong, female   DOB: 76 y.o.   MRN: [id number]   HPI Patient presents stating the second toe joint left has become tender and I am getting swelling of a generalized nature of my left foot and the right shows inflammation and pain around the first MPJ with bunion deformity   ROS      Objective:  Physical Exam  Neurovascular status intact inflammation pain second MPJ left moderate swelling of the left forefoot into the midfoot ankle negative Homans' sign noted right foot shows inflammatory inflammation around the first MPJ with structural deformity     Assessment:  Inflammatory capsulitis left swelling left inflammatory capsulitis first MPJ right structural bunion deformity     Plan:  H&P reviewed x-rays all conditions today I did sterile prep injected around the second MPJ left 3 mg Dexasone Kenalog 5 mg Xylocaine periarticular and around the first MPJ right 3 mg dexamethasone 5 mg Xylocaine Marcaine with Kenalog periarticular around the first MPJ dispense ankle compression stocking left to try to help with swelling and if any changes occur let us know  X-rays indicate that there is severe arthritis first MPJ left structural bunion deformity right

## 2022-06-04 ENCOUNTER — Telehealth: Payer: Self-pay | Admitting: Internal Medicine

## 2022-06-04 DIAGNOSIS — N8110 Cystocele, unspecified: Secondary | ICD-10-CM | POA: Diagnosis not present

## 2022-06-04 DIAGNOSIS — Z4689 Encounter for fitting and adjustment of other specified devices: Secondary | ICD-10-CM | POA: Diagnosis not present

## 2022-06-04 NOTE — Telephone Encounter (Signed)
LVM for pt to rtn my call to schedule AWV with [facility] call back # [phone] 

## 2022-06-09 ENCOUNTER — Other Ambulatory Visit: Payer: Self-pay | Admitting: Internal Medicine

## 2022-06-10 ENCOUNTER — Ambulatory Visit (INDEPENDENT_AMBULATORY_CARE_PROVIDER_SITE_OTHER): Payer: Medicare Other | Admitting: Internal Medicine

## 2022-06-10 ENCOUNTER — Encounter: Payer: Self-pay | Admitting: Internal Medicine

## 2022-06-10 VITALS — BP 140/68 | HR 72 | Temp 97.9°F | Ht 64.0 in | Wt 139.6 lb

## 2022-06-10 DIAGNOSIS — G43109 Migraine with aura, not intractable, without status migrainosus: Secondary | ICD-10-CM | POA: Diagnosis not present

## 2022-06-10 DIAGNOSIS — I10 Essential (primary) hypertension: Secondary | ICD-10-CM

## 2022-06-10 DIAGNOSIS — F413 Other mixed anxiety disorders: Secondary | ICD-10-CM

## 2022-06-10 DIAGNOSIS — R42 Dizziness and giddiness: Secondary | ICD-10-CM | POA: Diagnosis not present

## 2022-06-10 MED ORDER — BUPROPION HCL ER (XL) 150 MG PO TB24: 150.0000 mg | ORAL_TABLET | Freq: Every day | ORAL | 1 refills | Status: DC

## 2022-06-10 NOTE — Progress Notes (Signed)
Subjective:  Patient ID: Haley Armstrong, female    DOB: 03/12/1946  Age: 76 y.o. MRN: [id number]  CC: Follow-up (3 month f/u)   HPI SHEILIA REZNICK presents for HTN, depression, anxiety  Outpatient Medications Prior to Visit  Medication Sig Dispense Refill   amLODipine (NORVASC) 2.5 MG tablet TAKE 1 TABLET BY MOUTH EVERY DAY 90 tablet 1   Apoaequorin (PREVAGEN) 10 MG CAPS Take by mouth.     buPROPion (WELLBUTRIN XL) 150 MG 24 hr tablet TAKE 1 TABLET (150 MG TOTAL) BY MOUTH DAILY. TAKE IN AM 90 tablet 1   Cholecalciferol (VITAMIN D3) 50 MCG (2000 UT) capsule Take 1 capsule (2,000 Units total) by mouth daily. 100 capsule 3   diclofenac sodium (VOLTAREN) 1 % GEL Apply 4 g topically 4 (four) times daily. 100 Tube 11   enalapril (VASOTEC) 5 MG tablet TAKE 1 TABLET BY MOUTH TWICE A DAY 180 tablet 1   fluticasone (FLONASE) 50 MCG/ACT nasal spray Place 1 spray into both nostrils daily. 16 g 0   hyoscyamine (LEVBID) 0.375 MG 12 hr tablet TAKE 1 TABLET BY MOUTH TWICE A DAY 60 tablet 1   hyoscyamine (LEVSIN SL) 0.125 MG SL tablet DISSOLVE 1-2 TABLETS UNDER THE TONGUE EVERY FOUR HOURS AS NEEDED FOR PAIN 100 tablet 2   ipratropium (ATROVENT) 0.03 % nasal spray ipratropium bromide 21 mcg (0.03 %) nasal spray     levothyroxine (SYNTHROID) 25 MCG tablet TAKE ONE TABLET BY MOUTH EVERY DAY BEFORE BREAKFAST 90 tablet 1   LORazepam (ATIVAN) 0.5 MG tablet Take 1 by mouth every night at bedtime as needed for sleep 30 tablet 3   Lutein-Zeaxanthin 25-5 MG CAPS Take by mouth. Takes one daily     meclizine (ANTIVERT) 12.5 MG tablet Take 1-2 tablets (12.5-25 mg total) by mouth 3 (three) times daily as needed for dizziness. 30 tablet 0   meloxicam (MOBIC) 15 MG tablet TAKE 1 TABLET BY MOUTH EVERY DAY AFTER SURGERY AS NEEDED 90 tablet 0   methocarbamol (ROBAXIN) 500 MG tablet Take 1 tablet (500 mg total) by mouth every 6 (six) hours as needed. 120 tablet 3   Multiple Vitamins-Minerals (MULTIVITAMIN,TX-MINERALS)  tablet Take 1 tablet by mouth daily.       NON FORMULARY Vitamin C, Osteo BiFlex, Vitamin B12 - PO daily     Omega 3 1000 MG CAPS Take 2 capsules by mouth daily.     ondansetron (ZOFRAN-ODT) 4 MG disintegrating tablet Take 1 tablet (4 mg total) by mouth every 8 (eight) hours as needed for nausea or vomiting. 5 tablet 0   pantoprazole (PROTONIX) 40 MG tablet TAKE 1 TABLET BY MOUTH EVERY DAY 90 tablet 3   SUMAtriptan (IMITREX) 100 MG tablet Take 1 tablet (100 mg total) by mouth every 2 (two) hours as needed for migraine. May repeat in 2 hours if headache persists or recurs. 10 tablet 2   traMADol (ULTRAM) 50 MG tablet 1 po bid prn pain 60 tablet 1   triamcinolone cream (KENALOG) 0.1 %  (Patient not taking: Reported on 06/10/2022)     No facility-administered medications prior to visit.    ROS: Review of Systems  Constitutional:  Negative for activity change, appetite change, chills, fatigue and unexpected weight change.  HENT:  Negative for congestion, mouth sores and sinus pressure.   Eyes:  Negative for visual disturbance.  Respiratory:  Negative for cough and chest tightness.   Gastrointestinal:  Negative for abdominal pain and nausea.  Genitourinary:  Negative  for difficulty urinating, frequency and vaginal pain.  Musculoskeletal:  Negative for back pain and gait problem.  Skin:  Negative for pallor and rash.  Neurological:  Negative for dizziness, tremors, weakness, numbness and headaches.  Psychiatric/Behavioral:  Negative for confusion, sleep disturbance and suicidal ideas. The patient is nervous/anxious.     Objective:  BP (!) 140/68 (BP Location: Left Arm)   Pulse 72   Temp 97.9 F (36.6 C) (Oral)   Ht '5\' 4"'$  (1.626 m)   Wt 139 lb 9.6 oz (63.3 kg)   SpO2 97%   BMI 23.96 kg/m   BP Readings from Last 3 Encounters:  06/10/22 (!) 140/68  03/11/22 (!) 142/62  10/29/21 (!) 144/88    Wt Readings from Last 3 Encounters:  06/10/22 139 lb 9.6 oz (63.3 kg)  03/11/22 141 lb 3.2  oz (64 kg)  10/29/21 142 lb (64.4 kg)    Physical Exam Constitutional:      General: She is not in acute distress.    Appearance: Normal appearance. She is well-developed.  HENT:     Head: Normocephalic.     Right Ear: External ear normal.     Left Ear: External ear normal.     Nose: Nose normal.  Eyes:     General:        Right eye: No discharge.        Left eye: No discharge.     Conjunctiva/sclera: Conjunctivae normal.     Pupils: Pupils are equal, round, and reactive to light.  Neck:     Thyroid: No thyromegaly.     Vascular: No JVD.     Trachea: No tracheal deviation.  Cardiovascular:     Rate and Rhythm: Normal rate and regular rhythm.     Heart sounds: Normal heart sounds.  Pulmonary:     Effort: No respiratory distress.     Breath sounds: No stridor. No wheezing.  Abdominal:     General: Bowel sounds are normal. There is no distension.     Palpations: Abdomen is soft. There is no mass.     Tenderness: There is no abdominal tenderness. There is no guarding or rebound.  Musculoskeletal:        General: No tenderness.     Cervical back: Normal range of motion and neck supple. No rigidity.  Lymphadenopathy:     Cervical: No cervical adenopathy.  Skin:    Findings: No erythema or rash.  Neurological:     Cranial Nerves: No cranial nerve deficit.     Motor: No abnormal muscle tone.     Coordination: Coordination normal.     Deep Tendon Reflexes: Reflexes normal.  Psychiatric:        Behavior: Behavior normal.        Thought Content: Thought content normal.        Judgment: Judgment normal.     Lab Results  Component Value Date   WBC 12.4 (H) 09/15/2021   HGB 13.2 09/15/2021   HCT 39.0 09/15/2021   PLT 382 09/15/2021   GLUCOSE 114 (H) 09/15/2021   CHOL 168 09/03/2020   TRIG 54.0 09/03/2020   HDL 67.10 09/03/2020   LDLCALC 90 09/03/2020   ALT 16 09/15/2021   AST 21 09/15/2021   NA 134 (L) 09/15/2021   K 4.8 09/15/2021   CL 100 09/15/2021    CREATININE 0.83 09/15/2021   BUN 15 09/15/2021   CO2 26 09/15/2021   TSH 3.27 07/29/2021   INR 0.90 08/05/2009  HGBA1C 5.8 05/08/2016    MR BRAIN WO CONTRAST  Result Date: 09/15/2021 CLINICAL DATA:  Dizziness and headache EXAM: MRI HEAD WITHOUT CONTRAST MRA HEAD WITHOUT CONTRAST MRA OF THE NECK WITHOUT AND WITH CONTRAST TECHNIQUE: Multiplanar, multi-echo pulse sequences of the brain and surrounding structures were acquired without intravenous contrast. Angiographic images of the Circle of Willis were acquired using MRA technique without intravenous contrast. Angiographic images of the neck were acquired using MRA technique without and with intravenous contrast. Carotid stenosis measurements (when applicable) are obtained utilizing NASCET criteria, using the distal internal carotid diameter as the denominator. CONTRAST:  6.75m GADAVIST GADOBUTROL 1 MMOL/ML IV SOLN COMPARISON:  No pertinent prior exam. FINDINGS: MR HEAD FINDINGS Brain: No acute infarct, mass effect or extra-axial collection. No acute or chronic hemorrhage. Normal white matter signal, parenchymal volume and CSF spaces. The midline structures are normal. Vascular: Major flow voids are preserved. Skull and upper cervical spine: Normal calvarium and skull base. Visualized upper cervical spine and soft tissues are normal. Sinuses/Orbits:No paranasal sinus fluid levels or advanced mucosal thickening. No mastoid or middle ear effusion. Normal orbits. MRA HEAD FINDINGS POSTERIOR CIRCULATION: --Vertebral arteries: Normal --Inferior cerebellar arteries: Normal. --Basilar artery: Normal. --Superior cerebellar arteries: Normal. --Posterior cerebral arteries: Mild stenosis at the left P1-2 junction. Otherwise normal. ANTERIOR CIRCULATION: --Intracranial internal carotid arteries: Normal. --Anterior cerebral arteries (ACA): Normal. --Middle cerebral arteries (MCA): Normal. ANATOMIC VARIANTS: None MRA NECK FINDINGS Aortic arch: Normal Right carotid  system: Normal Left carotid system: Normal Vertebral arteries: Codominant system.  Normal. Other: None. IMPRESSION: 1. Normal MRI of the brain. 2. Mild stenosis at the left PCA P1-2 junction. 3. Normal MRA of the neck. Electronically Signed   By: KUlyses JarredM.D.   On: 09/15/2021 22:56   MR ANGIO HEAD WO CONTRAST  Result Date: 09/15/2021 CLINICAL DATA:  Dizziness and headache EXAM: MRI HEAD WITHOUT CONTRAST MRA HEAD WITHOUT CONTRAST MRA OF THE NECK WITHOUT AND WITH CONTRAST TECHNIQUE: Multiplanar, multi-echo pulse sequences of the brain and surrounding structures were acquired without intravenous contrast. Angiographic images of the Circle of Willis were acquired using MRA technique without intravenous contrast. Angiographic images of the neck were acquired using MRA technique without and with intravenous contrast. Carotid stenosis measurements (when applicable) are obtained utilizing NASCET criteria, using the distal internal carotid diameter as the denominator. CONTRAST:  6.583mGADAVIST GADOBUTROL 1 MMOL/ML IV SOLN COMPARISON:  No pertinent prior exam. FINDINGS: MR HEAD FINDINGS Brain: No acute infarct, mass effect or extra-axial collection. No acute or chronic hemorrhage. Normal white matter signal, parenchymal volume and CSF spaces. The midline structures are normal. Vascular: Major flow voids are preserved. Skull and upper cervical spine: Normal calvarium and skull base. Visualized upper cervical spine and soft tissues are normal. Sinuses/Orbits:No paranasal sinus fluid levels or advanced mucosal thickening. No mastoid or middle ear effusion. Normal orbits. MRA HEAD FINDINGS POSTERIOR CIRCULATION: --Vertebral arteries: Normal --Inferior cerebellar arteries: Normal. --Basilar artery: Normal. --Superior cerebellar arteries: Normal. --Posterior cerebral arteries: Mild stenosis at the left P1-2 junction. Otherwise normal. ANTERIOR CIRCULATION: --Intracranial internal carotid arteries: Normal. --Anterior  cerebral arteries (ACA): Normal. --Middle cerebral arteries (MCA): Normal. ANATOMIC VARIANTS: None MRA NECK FINDINGS Aortic arch: Normal Right carotid system: Normal Left carotid system: Normal Vertebral arteries: Codominant system.  Normal. Other: None. IMPRESSION: 1. Normal MRI of the brain. 2. Mild stenosis at the left PCA P1-2 junction. 3. Normal MRA of the neck. Electronically Signed   By: KeUlyses Jarred.D.   On: 09/15/2021 22:56  MR Angiogram Neck W or Wo Contrast  Result Date: 09/15/2021 CLINICAL DATA:  Dizziness and headache EXAM: MRI HEAD WITHOUT CONTRAST MRA HEAD WITHOUT CONTRAST MRA OF THE NECK WITHOUT AND WITH CONTRAST TECHNIQUE: Multiplanar, multi-echo pulse sequences of the brain and surrounding structures were acquired without intravenous contrast. Angiographic images of the Circle of Willis were acquired using MRA technique without intravenous contrast. Angiographic images of the neck were acquired using MRA technique without and with intravenous contrast. Carotid stenosis measurements (when applicable) are obtained utilizing NASCET criteria, using the distal internal carotid diameter as the denominator. CONTRAST:  6.97m GADAVIST GADOBUTROL 1 MMOL/ML IV SOLN COMPARISON:  No pertinent prior exam. FINDINGS: MR HEAD FINDINGS Brain: No acute infarct, mass effect or extra-axial collection. No acute or chronic hemorrhage. Normal white matter signal, parenchymal volume and CSF spaces. The midline structures are normal. Vascular: Major flow voids are preserved. Skull and upper cervical spine: Normal calvarium and skull base. Visualized upper cervical spine and soft tissues are normal. Sinuses/Orbits:No paranasal sinus fluid levels or advanced mucosal thickening. No mastoid or middle ear effusion. Normal orbits. MRA HEAD FINDINGS POSTERIOR CIRCULATION: --Vertebral arteries: Normal --Inferior cerebellar arteries: Normal. --Basilar artery: Normal. --Superior cerebellar arteries: Normal. --Posterior  cerebral arteries: Mild stenosis at the left P1-2 junction. Otherwise normal. ANTERIOR CIRCULATION: --Intracranial internal carotid arteries: Normal. --Anterior cerebral arteries (ACA): Normal. --Middle cerebral arteries (MCA): Normal. ANATOMIC VARIANTS: None MRA NECK FINDINGS Aortic arch: Normal Right carotid system: Normal Left carotid system: Normal Vertebral arteries: Codominant system.  Normal. Other: None. IMPRESSION: 1. Normal MRI of the brain. 2. Mild stenosis at the left PCA P1-2 junction. 3. Normal MRA of the neck. Electronically Signed   By: KUlyses JarredM.D.   On: 09/15/2021 22:56   CT Head Wo Contrast  Result Date: 09/15/2021 CLINICAL DATA:  Sudden onset of severe headache, new or worsening. EXAM: CT HEAD WITHOUT CONTRAST TECHNIQUE: Contiguous axial images were obtained from the base of the skull through the vertex without intravenous contrast. RADIATION DOSE REDUCTION: This exam was performed according to the departmental dose-optimization program which includes automated exposure control, adjustment of the mA and/or kV according to patient size and/or use of iterative reconstruction technique. COMPARISON:  Head CT and brain MRI 06/20/2019 FINDINGS: Brain: No intracranial hemorrhage, mass effect, or midline shift. No hydrocephalus. The basilar cisterns are patent. Brain volume is normal for age. No evidence of territorial infarct or acute ischemia. No extra-axial or intracranial fluid collection. Vascular: Atherosclerosis of skullbase vasculature without hyperdense vessel or abnormal calcification. Skull: No fracture or focal lesion. Sinuses/Orbits: Chronic opacification of posterior lower left mastoid air cells. Paranasal sinuses are clear. No orbital abnormalities. Other: None. IMPRESSION: 1. No acute intracranial abnormality. 2. Chronic opacification of posterior lower left mastoid air cells. Electronically Signed   By: MKeith RakeM.D.   On: 09/15/2021 18:11    Assessment & Plan:    Problem List Items Addressed This Visit     Vertigo - Primary    Recurrent BPV Meclizine prn Appt w/Dr RConstance Holsterpending      Migraines    Using Sumatriptan and a muscle relaxant      Essential hypertension    Cont on Amlodipine, Enalapril      Anxiety disorder    Chronic Using Lorazepam sometimes  Potential benefits of a long term benzodiazepines  use as well as potential risks  and complications were explained to the patient and were aknowledged. Stress reduction discussed  No orders of the defined types were placed in this encounter.     Follow-up: Return in about 3 months (around 09/09/2022) for a follow-up visit.  Walker Kehr, MD

## 2022-06-10 NOTE — Assessment & Plan Note (Signed)
Chronic Using Lorazepam sometimes  Potential benefits of a long term benzodiazepines  use as well as potential risks  and complications were explained to the patient and were aknowledged. Stress reduction discussed

## 2022-06-10 NOTE — Assessment & Plan Note (Signed)
Cont on Amlodipine, Enalapril

## 2022-06-10 NOTE — Assessment & Plan Note (Addendum)
Recurrent BPV Meclizine prn Appt w/Dr Constance Holster pending

## 2022-06-10 NOTE — Assessment & Plan Note (Signed)
Using Sumatriptan and a muscle relaxant

## 2022-07-06 DIAGNOSIS — H8110 Benign paroxysmal vertigo, unspecified ear: Secondary | ICD-10-CM | POA: Insufficient documentation

## 2022-07-06 DIAGNOSIS — H9113 Presbycusis, bilateral: Secondary | ICD-10-CM | POA: Diagnosis not present

## 2022-07-13 ENCOUNTER — Other Ambulatory Visit: Payer: Self-pay | Admitting: Internal Medicine

## 2022-08-05 ENCOUNTER — Other Ambulatory Visit: Payer: Self-pay | Admitting: Internal Medicine

## 2022-08-19 ENCOUNTER — Encounter: Payer: Self-pay | Admitting: Internal Medicine

## 2022-08-24 ENCOUNTER — Other Ambulatory Visit: Payer: Self-pay | Admitting: Internal Medicine

## 2022-09-02 DIAGNOSIS — M542 Cervicalgia: Secondary | ICD-10-CM | POA: Diagnosis not present

## 2022-09-02 DIAGNOSIS — M25512 Pain in left shoulder: Secondary | ICD-10-CM | POA: Diagnosis not present

## 2022-09-02 DIAGNOSIS — M25511 Pain in right shoulder: Secondary | ICD-10-CM | POA: Diagnosis not present

## 2022-09-07 DIAGNOSIS — N8110 Cystocele, unspecified: Secondary | ICD-10-CM | POA: Diagnosis not present

## 2022-09-07 DIAGNOSIS — R350 Frequency of micturition: Secondary | ICD-10-CM | POA: Diagnosis not present

## 2022-09-15 ENCOUNTER — Other Ambulatory Visit: Payer: Self-pay | Admitting: Internal Medicine

## 2022-09-16 DIAGNOSIS — N8110 Cystocele, unspecified: Secondary | ICD-10-CM | POA: Diagnosis not present

## 2022-09-23 DIAGNOSIS — Z1231 Encounter for screening mammogram for malignant neoplasm of breast: Secondary | ICD-10-CM | POA: Diagnosis not present

## 2022-09-26 DIAGNOSIS — M25512 Pain in left shoulder: Secondary | ICD-10-CM | POA: Diagnosis not present

## 2022-09-27 ENCOUNTER — Encounter: Payer: Self-pay | Admitting: Internal Medicine

## 2022-09-28 NOTE — Telephone Encounter (Signed)
FYI... pt also requesting labs prior to 5/20 visit

## 2022-10-01 ENCOUNTER — Other Ambulatory Visit: Payer: Self-pay | Admitting: Internal Medicine

## 2022-10-01 DIAGNOSIS — I10 Essential (primary) hypertension: Secondary | ICD-10-CM

## 2022-10-01 DIAGNOSIS — E785 Hyperlipidemia, unspecified: Secondary | ICD-10-CM

## 2022-10-01 DIAGNOSIS — D72829 Elevated white blood cell count, unspecified: Secondary | ICD-10-CM

## 2022-10-12 DIAGNOSIS — M25512 Pain in left shoulder: Secondary | ICD-10-CM | POA: Diagnosis not present

## 2022-10-15 ENCOUNTER — Telehealth: Payer: Self-pay | Admitting: Internal Medicine

## 2022-10-15 ENCOUNTER — Ambulatory Visit
Admission: EM | Admit: 2022-10-15 | Discharge: 2022-10-15 | Disposition: A | Discharge status: Home / Self Care | Payer: Medicare Other | Attending: Urgent Care | Admitting: Urgent Care

## 2022-10-15 DIAGNOSIS — M5412 Radiculopathy, cervical region: Secondary | ICD-10-CM | POA: Diagnosis not present

## 2022-10-15 DIAGNOSIS — S46012A Strain of muscle(s) and tendon(s) of the rotator cuff of left shoulder, initial encounter: Secondary | ICD-10-CM | POA: Insufficient documentation

## 2022-10-15 DIAGNOSIS — S46912A Strain of unspecified muscle, fascia and tendon at shoulder and upper arm level, left arm, initial encounter: Secondary | ICD-10-CM | POA: Diagnosis not present

## 2022-10-15 DIAGNOSIS — Z981 Arthrodesis status: Secondary | ICD-10-CM

## 2022-10-15 DIAGNOSIS — I10 Essential (primary) hypertension: Secondary | ICD-10-CM

## 2022-10-15 DIAGNOSIS — M5030 Other cervical disc degeneration, unspecified cervical region: Secondary | ICD-10-CM

## 2022-10-15 MED ORDER — TIZANIDINE HCL 2 MG PO TABS: 2.0000 mg | ORAL_TABLET | Freq: Every evening | ORAL | 0 refills | Status: DC | PRN

## 2022-10-15 NOTE — Telephone Encounter (Signed)
Pt just left [facility] her bp levels been high. Urgent care suggested pt to speak with Dr. Macario Golds about upping her dosage amLODipine (NORVASC) 2.5 MG tablet this has been going on for a few day.s Urgent care really think the spiking bp levels is coming from pain in her neck and shoulder which she is going to get that look at as of today 4/18. Pt also inquired about medication  LORazepam (ATIVAN) 0.5 MG tablet  because her stress levels has been really high. Please call pt back with update.

## 2022-10-15 NOTE — ED Provider Notes (Signed)
[name]  Note:  This document was prepared using Conservation officer, historic buildings and may include unintentional dictation errors.  MRN: [id number] DOB: October 12, 1945  Subjective:   Haley Armstrong is a 77 y.o. female presenting for 1 week history of persistent and worsening pain over the left trapezius that is now radiating into the shoulder and left upper arm. Has a history of cervical fusion C4-C5. Just had multiple local injections over the past month.  Was advised that she also has degenerative disc disease, was also told that this could be expected with her history of surgery.  Patient has become concerned as her pain is worsening and is causing her pressure to be more elevated.  She is taking her amlodipine.  Has done 2 doses of enalapril today.  She also took lorazepam to help her with her anxiety as she knows this can affect her blood pressure.  She does have a PCP that she can follow-up with closely and has just seen them.  No overt chest pain, shortness of breath, headache, confusion, weakness, numbness or tingling.  No current facility-administered medications for this encounter.  Current Outpatient Medications:    amLODipine (NORVASC) 2.5 MG tablet, TAKE 1 TABLET BY MOUTH EVERY DAY, Disp: 90 tablet, Rfl: 1   Apoaequorin (PREVAGEN) 10 MG CAPS, Take by mouth., Disp: , Rfl:    buPROPion (WELLBUTRIN XL) 150 MG 24 hr tablet, Take 1 tablet (150 mg total) by mouth daily. Take in am, Disp: 90 tablet, Rfl: 1   Cholecalciferol (VITAMIN D3) 50 MCG (2000 UT) capsule, Take 1 capsule (2,000 Units total) by mouth daily., Disp: 100 capsule, Rfl: 3   diclofenac sodium (VOLTAREN) 1 % GEL, Apply 4 g topically 4 (four) times daily., Disp: 100 Tube, Rfl: 11   enalapril (VASOTEC) 5 MG tablet, TAKE 1 TABLET BY MOUTH TWICE A DAY, Disp: 180 tablet, Rfl: 1   fluticasone (FLONASE) 50 MCG/ACT nasal spray, Place 1 spray into both nostrils daily., Disp: 16 g, Rfl: 0   hyoscyamine  (LEVBID) 0.375 MG 12 hr tablet, TAKE 1 TABLET BY MOUTH TWICE A DAY, Disp: 60 tablet, Rfl: 1   hyoscyamine (LEVSIN SL) 0.125 MG SL tablet, DISSOLVE 1-2 TABLETS UNDER THE TONGUE EVERY FOUR HOURS AS NEEDED FOR PAIN, Disp: 100 tablet, Rfl: 2   ipratropium (ATROVENT) 0.03 % nasal spray, ipratropium bromide 21 mcg (0.03 %) nasal spray, Disp: , Rfl:    levothyroxine (SYNTHROID) 25 MCG tablet, TAKE ONE TABLET BY MOUTH EVERY DAY BEFORE BREAKFAST, Disp: 90 tablet, Rfl: 1   LORazepam (ATIVAN) 0.5 MG tablet, Take 1 by mouth every night at bedtime as needed for sleep, Disp: 30 tablet, Rfl: 3   Lutein-Zeaxanthin 25-5 MG CAPS, Take by mouth. Takes one daily, Disp: , Rfl:    meclizine (ANTIVERT) 12.5 MG tablet, Take 1-2 tablets (12.5-25 mg total) by mouth 3 (three) times daily as needed for dizziness., Disp: 30 tablet, Rfl: 0   meloxicam (MOBIC) 15 MG tablet, TAKE 1 TABLET BY MOUTH EVERY DAY AFTER SURGERY AS NEEDED, Disp: 90 tablet, Rfl: 0   methocarbamol (ROBAXIN) 500 MG tablet, TAKE 1 TABLET BY MOUTH EVERY 6 HOURS AS NEEDED., Disp: 120 tablet, Rfl: 3   Multiple Vitamins-Minerals (MULTIVITAMIN,TX-MINERALS) tablet, Take 1 tablet by mouth daily.  , Disp: , Rfl:    NON FORMULARY, Vitamin C, Osteo BiFlex, Vitamin B12 - PO daily, Disp: , Rfl:    Omega 3 1000 MG CAPS, Take 2 capsules by mouth daily., Disp: ,  Rfl:    ondansetron (ZOFRAN-ODT) 4 MG disintegrating tablet, Take 1 tablet (4 mg total) by mouth every 8 (eight) hours as needed for nausea or vomiting., Disp: 5 tablet, Rfl: 0   pantoprazole (PROTONIX) 40 MG tablet, TAKE 1 TABLET BY MOUTH EVERY DAY, Disp: 90 tablet, Rfl: 3   SUMAtriptan (IMITREX) 100 MG tablet, Take 1 tablet (100 mg total) by mouth every 2 (two) hours as needed for migraine. May repeat in 2 hours if headache persists or recurs., Disp: 10 tablet, Rfl: 2   traMADol (ULTRAM) 50 MG tablet, 1 po bid prn pain, Disp: 60 tablet, Rfl: 1   No Known Allergies  Past Medical History:  Diagnosis Date    Anemia, iron deficiency    Anxiety    Blood donor    Family history of coronary artery disease 06/04/2016   GERD (gastroesophageal reflux disease)    Hypertension    IBS (irritable bowel syndrome)    Osteoarthritis      Past Surgical History:  Procedure Laterality Date   APPENDECTOMY     CERVICAL FUSION  2011   C4-5 Dr Alveda Reasons   CHOLECYSTECTOMY     ESOPHAGEAL MANOMETRY N/A 11/13/2013   Procedure: ESOPHAGEAL MANOMETRY (EM);  Surgeon: Hilarie Fredrickson, MD;  Location: [facility];  Service: Endoscopy;  Laterality: N/A;    Family History  Problem Relation Age of Onset   Lymphoma Mother    Dementia Mother    Uterine cancer Mother 62       uterine sarcoma   Hypothyroidism Mother    Parkinsonism Father    Heart disease Brother        CAD   Heart attack Brother    Heart disease Maternal Aunt    Heart attack Maternal Aunt    Colon cancer Neg Hx    Esophageal cancer Neg Hx    Rectal cancer Neg Hx    Stomach cancer Neg Hx     Social History   Tobacco Use   Smoking status: Former   Smokeless tobacco: Never  Building services engineer Use: Never used  Substance Use Topics   Alcohol use: Yes    Comment: occ   Drug use: No    ROS   Objective:   Vitals: BP (!) 205/83 (BP Location: Right Arm)   Pulse 66   Temp 97.9 F (36.6 C) (Oral)   Resp 16   SpO2 99%   Blood pressure recheck 187/79.  BP Readings from Last 3 Encounters:  10/15/22 (!) 205/83  06/10/22 (!) 140/68  03/11/22 (!) 142/62   Physical Exam Constitutional:      General: She is not in acute distress.    Appearance: Normal appearance. She is well-developed. She is not ill-appearing, toxic-appearing or diaphoretic.  HENT:     Head: Normocephalic and atraumatic.     Right Ear: External ear normal.     Left Ear: External ear normal.     Nose: Nose normal.     Mouth/Throat:     Mouth: Mucous membranes are moist.  Eyes:     General: No scleral icterus.       Right eye: No discharge.        Left eye: No  discharge.     Extraocular Movements: Extraocular movements intact.  Cardiovascular:     Rate and Rhythm: Normal rate and regular rhythm.     Heart sounds: Normal heart sounds. No murmur heard.    No friction rub. No gallop.  Pulmonary:  Effort: Pulmonary effort is normal. No respiratory distress.     Breath sounds: No stridor. No wheezing, rhonchi or rales.  Chest:     Chest wall: No tenderness.  Musculoskeletal:     Cervical back: Neck supple. Spasms and tenderness present. No swelling, edema, deformity, erythema, signs of trauma, lacerations, rigidity, torticollis, bony tenderness or crepitus. Pain with movement (positive Spurling maneuver to the left) present. No spinous process tenderness or muscular tenderness. Decreased range of motion.  Skin:    General: Skin is warm and dry.  Neurological:     General: No focal deficit present.     Mental Status: She is alert and oriented to person, place, and time.     Cranial Nerves: No cranial nerve deficit.     Motor: No weakness.     Coordination: Coordination normal.     Gait: Gait normal.     Deep Tendon Reflexes: Reflexes normal.     Comments: Negative Romberg and pronator drift, no facial asymmetry.  Psychiatric:        Mood and Affect: Mood normal.        Behavior: Behavior normal.    ED ECG REPORT   Date: 10/15/2022  EKG Time: 2:34 PM  Rate: 62bpm  Rhythm: normal sinus rhythm  Axis: normal  Intervals:none  ST&T Change: No T wave changes  Narrative Interpretation: Sinus rhythm at 62 bpm without any acute findings, no change from priors.  Assessment and Plan :   PDMP not reviewed this encounter.  1. Cervical radicular pain   2. Degenerative disc disease, cervical   3. History of fusion of cervical spine   4. Essential hypertension    No signs of an acute encephalopathy, low suspicion for ACS.  Suspect that her symptoms are primarily cervical radiculopathy.  We cannot risk the use of steroids exacerbating her blood  pressure further.  Emphasized need for follow-up with her orthopedist for further management.  Recommended Tylenol, tizanidine.  Maintain blood pressure medications and follow-up with PCP as soon as possible.  Counseled patient on potential for adverse effects with medications prescribed/recommended today, ER and return-to-clinic precautions discussed, patient verbalized understanding.    Wallis Bamberg, [place] 10/15/22 1436

## 2022-10-15 NOTE — ED Triage Notes (Signed)
Pt states she has chronic left shoulder pain and had dry needling on Tuesday.  States yesterday her shoulder pain was worse and now her pain is going down her arm to her elbow.  Pt also states she is having a lot of stress and that her BP was high states she didn't take her BP medication yesterday.  States this morning her BP was high 176/102 and she took her enalapril and lorazepam and it came down but now it is back up again.

## 2022-10-15 NOTE — Discharge Instructions (Addendum)
Please go to the [facility] urgent clinic as I suspect you might need further management with them given your degenerative disc disease, history of cervical fusion.   Continue taking your enalapril and amlodipine for your blood pressure. Follow up with your PCP as soon as possible for a recheck on your blood pressure.

## 2022-10-15 NOTE — Telephone Encounter (Signed)
Called pt she states her BP has been running high. Went to [facility] urgent care BP was 187/79 . This morning when she got up 176/102 took her enalapril and amlodipine drop to 166/80. Pt states her BP been running 144's/80. Requesting increase in BP med, also would like refill on Lorazepam since she is going threw so much.Marland KitchenRaechel Chute

## 2022-10-16 MED ORDER — LORAZEPAM 0.5 MG PO TABS: ORAL_TABLET | ORAL | 3 refills | Status: DC

## 2022-10-16 NOTE — Telephone Encounter (Signed)
Called pt and relayed dr's recommendations, she declined scheduling a sooner appt at this time as she states she has an appt next month and this morning she took her BP and it was "in my normal range in the 140s" she states she will continue to monitor and record them and bring them to her next visit with Dr. Macario Golds. I advised that if her BP continues to stay high to please call the office and schedule an appointment as high BP can be dangerous, pt verbalized understanding and states she will, she just thinks she "was having a bad couple days but will be sure to schedule if this continues"

## 2022-10-16 NOTE — Telephone Encounter (Signed)
Monitor blood pressure at home/at work and keep a record.  Office visit with blood pressure record. Lorazepam was renewed. Thank you

## 2022-10-17 ENCOUNTER — Encounter: Payer: Self-pay | Admitting: Internal Medicine

## 2022-10-19 ENCOUNTER — Encounter: Payer: Self-pay | Admitting: Internal Medicine

## 2022-10-19 ENCOUNTER — Other Ambulatory Visit: Payer: Self-pay | Admitting: Internal Medicine

## 2022-10-21 DIAGNOSIS — M25512 Pain in left shoulder: Secondary | ICD-10-CM | POA: Diagnosis not present

## 2022-10-21 DIAGNOSIS — M542 Cervicalgia: Secondary | ICD-10-CM | POA: Diagnosis not present

## 2022-10-24 ENCOUNTER — Other Ambulatory Visit: Payer: Self-pay | Admitting: Internal Medicine

## 2022-10-24 MED ORDER — TRAMADOL HCL 50 MG PO TABS: 50.0000 mg | ORAL_TABLET | Freq: Three times a day (TID) | ORAL | 1 refills | Status: DC | PRN

## 2022-10-28 ENCOUNTER — Encounter: Payer: Self-pay | Admitting: Internal Medicine

## 2022-10-29 ENCOUNTER — Other Ambulatory Visit: Payer: Self-pay | Admitting: Internal Medicine

## 2022-10-29 ENCOUNTER — Telehealth: Payer: Self-pay | Admitting: *Deleted

## 2022-10-29 MED ORDER — TIZANIDINE HCL 2 MG PO TABS: 2.0000 mg | ORAL_TABLET | Freq: Every evening | ORAL | 3 refills | Status: DC | PRN

## 2022-10-29 NOTE — Telephone Encounter (Signed)
Rec'd fax stating needing clarification on pt Tramadol. On the sig there is two directions. Needing to know if she suppose to tale 3 times a day, or twice a day

## 2022-10-30 MED ORDER — TRAMADOL HCL 50 MG PO TABS: 50.0000 mg | ORAL_TABLET | Freq: Three times a day (TID) | ORAL | 1 refills | Status: DC | PRN

## 2022-10-30 NOTE — Telephone Encounter (Signed)
Corrected. Thanks. 

## 2022-11-02 ENCOUNTER — Ambulatory Visit: Payer: Medicare Other | Admitting: Podiatry

## 2022-11-02 ENCOUNTER — Encounter: Payer: Self-pay | Admitting: Podiatry

## 2022-11-02 DIAGNOSIS — L600 Ingrowing nail: Secondary | ICD-10-CM | POA: Diagnosis not present

## 2022-11-04 NOTE — Progress Notes (Signed)
Subjective:   Patient ID: Haley Armstrong, female   DOB: 77 y.o.   MRN: [id number]   HPI Patient presents with ingrown toenail of the left big toe that is been sore and making it hard to wear shoe gear with some ridges of the nailbed also noted   ROS      Objective:  Physical Exam  Neurovascular status intact with patient found to have incurvated medial border left big toe that sore no active drainage or redness noted with mild ridging to the nailbed     Assessment:  Ingrown toenail deformity left hallux medial border with moderate pain     Plan:  H&P reviewed recommended correction due to the discomfort present.  No indication of infection and at this point I did go ahead I anesthetized 60 mg like Marcaine mixture sterile prep applied to the toe using sterile instrumentation remove the border exposed matrix applied phenol 3 applications 30 seconds followed by alcohol lavage sterile dressing gave instructions on soaks and to wear dressing 24 hours take it off earlier if throbbing were to occur encouraged her to call with all questions hopefully the ridge and will resolve but still may have some issues with the overlying nailbed

## 2022-11-05 ENCOUNTER — Other Ambulatory Visit: Payer: Self-pay | Admitting: Internal Medicine

## 2022-11-09 ENCOUNTER — Encounter: Payer: Self-pay | Admitting: Podiatry

## 2022-11-09 DIAGNOSIS — M25512 Pain in left shoulder: Secondary | ICD-10-CM | POA: Diagnosis not present

## 2022-11-09 NOTE — Telephone Encounter (Signed)
Please see pictures below.  Thanks

## 2022-11-11 ENCOUNTER — Telehealth: Payer: Self-pay | Admitting: Internal Medicine

## 2022-11-11 NOTE — Telephone Encounter (Signed)
Contacted Haley Armstrong to schedule their annual wellness visit. Appointment made for 11/26/2022.  [facility] Care Guide [facility] TEAM Direct Dial: [phone]

## 2022-11-12 ENCOUNTER — Other Ambulatory Visit: Payer: Self-pay | Admitting: Internal Medicine

## 2022-11-13 ENCOUNTER — Other Ambulatory Visit (INDEPENDENT_AMBULATORY_CARE_PROVIDER_SITE_OTHER): Payer: Medicare Other

## 2022-11-13 DIAGNOSIS — I10 Essential (primary) hypertension: Secondary | ICD-10-CM

## 2022-11-13 DIAGNOSIS — E785 Hyperlipidemia, unspecified: Secondary | ICD-10-CM

## 2022-11-13 DIAGNOSIS — M5412 Radiculopathy, cervical region: Secondary | ICD-10-CM | POA: Diagnosis not present

## 2022-11-13 DIAGNOSIS — D72829 Elevated white blood cell count, unspecified: Secondary | ICD-10-CM | POA: Diagnosis not present

## 2022-11-13 LAB — COMPREHENSIVE METABOLIC PANEL
ALT: 20 U/L (ref 0–35)
AST: 28 U/L (ref 0–37)
Albumin: 4 g/dL (ref 3.5–5.2)
Alkaline Phosphatase: 51 U/L (ref 39–117)
BUN: 18 mg/dL (ref 6–23)
CO2: 31 mEq/L (ref 19–32)
Calcium: 9.1 mg/dL (ref 8.4–10.5)
Chloride: 101 mEq/L (ref 96–112)
Creatinine, Ser: 0.92 mg/dL (ref 0.40–1.20)
GFR: 60.71 mL/min (ref >60.00)
Glucose, Bld: 84 mg/dL (ref 70–99)
Potassium: 5 mEq/L (ref 3.5–5.1)
Sodium: 137 mEq/L (ref 135–145)
Total Bilirubin: 0.4 mg/dL (ref 0.2–1.2)
Total Protein: 6.7 g/dL (ref 6.0–8.3)

## 2022-11-13 LAB — CBC WITH DIFFERENTIAL/PLATELET
Basophils Absolute: 0.1 10*3/uL (ref 0.0–0.1)
Basophils Relative: 0.9 % (ref 0.0–3.0)
Eosinophils Absolute: 0.2 10*3/uL (ref 0.0–0.7)
Eosinophils Relative: 3.1 % (ref 0.0–5.0)
HCT: 37.6 % (ref 36.0–46.0)
Hemoglobin: 12.9 g/dL (ref 12.0–15.0)
Lymphocytes Relative: 25.5 % (ref 12.0–46.0)
Lymphs Abs: 1.6 10*3/uL (ref 0.7–4.0)
MCHC: 34.3 g/dL (ref 30.0–36.0)
MCV: 96.1 fl (ref 78.0–100.0)
Monocytes Absolute: 0.5 10*3/uL (ref 0.1–1.0)
Monocytes Relative: 8.8 % (ref 3.0–12.0)
Neutro Abs: 3.8 10*3/uL (ref 1.4–7.7)
Neutrophils Relative %: 61.7 % (ref 43.0–77.0)
Platelets: 322 10*3/uL (ref 150.0–400.0)
RBC: 3.91 Mil/uL (ref 3.87–5.11)
RDW: 13.7 % (ref 11.5–15.5)
WBC: 6.2 10*3/uL (ref 4.0–10.5)

## 2022-11-13 LAB — LIPID PANEL
Cholesterol: 189 mg/dL (ref 0–200)
HDL: 66.6 mg/dL (ref >39.00)
LDL Cholesterol: 108 mg/dL — ABNORMAL HIGH (ref 0–99)
NonHDL: 122.45
Total CHOL/HDL Ratio: 3
Triglycerides: 72 mg/dL (ref 0.0–149.0)
VLDL: 14.4 mg/dL (ref 0.0–40.0)

## 2022-11-14 ENCOUNTER — Encounter: Payer: Self-pay | Admitting: Internal Medicine

## 2022-11-16 ENCOUNTER — Encounter: Payer: Self-pay | Admitting: Internal Medicine

## 2022-11-16 ENCOUNTER — Ambulatory Visit (INDEPENDENT_AMBULATORY_CARE_PROVIDER_SITE_OTHER): Payer: Medicare Other | Admitting: Internal Medicine

## 2022-11-16 ENCOUNTER — Other Ambulatory Visit: Payer: Self-pay | Admitting: Internal Medicine

## 2022-11-16 VITALS — BP 126/80 | HR 75 | Temp 98.2°F | Ht 64.0 in | Wt 140.0 lb

## 2022-11-16 DIAGNOSIS — M1990 Unspecified osteoarthritis, unspecified site: Secondary | ICD-10-CM

## 2022-11-16 DIAGNOSIS — M2550 Pain in unspecified joint: Secondary | ICD-10-CM

## 2022-11-16 DIAGNOSIS — F413 Other mixed anxiety disorders: Secondary | ICD-10-CM | POA: Diagnosis not present

## 2022-11-16 DIAGNOSIS — R600 Localized edema: Secondary | ICD-10-CM | POA: Diagnosis not present

## 2022-11-16 DIAGNOSIS — R7989 Other specified abnormal findings of blood chemistry: Secondary | ICD-10-CM

## 2022-11-16 DIAGNOSIS — I10 Essential (primary) hypertension: Secondary | ICD-10-CM

## 2022-11-16 LAB — SEDIMENTATION RATE: Sed Rate: 19 mm/hr (ref 0–30)

## 2022-11-16 MED ORDER — ENALAPRIL MALEATE 10 MG PO TABS
10.0000 mg | ORAL_TABLET | Freq: Two times a day (BID) | ORAL | 11 refills | Status: DC
Start: 2022-11-16 — End: 2023-11-10

## 2022-11-16 NOTE — Assessment & Plan Note (Signed)
Increase Enalapril to 10 mg bid

## 2022-11-16 NOTE — Assessment & Plan Note (Signed)
Severe Tramadol prn  Potential benefits of a long term opioids use as well as potential risks (i.e. addiction risk, apnea etc) and complications (i.e. Somnolence, constipation and others) were explained to the patient and were aknowledged. Work less!

## 2022-11-16 NOTE — Assessment & Plan Note (Signed)
Using Lorazepam sometimes  Potential benefits of a long term benzodiazepines  use as well as potential risks  and complications were explained to the patient and were aknowledged.

## 2022-11-16 NOTE — Assessment & Plan Note (Signed)
Tramadol prn - rare use ? Potential benefits of a long term opioids use as well as potential risks (i.e. addiction risk, apnea etc) and complications (i.e. Somnolence, constipation and others) were explained to the patient and were aknowledged. ?Blue-Emu cream was recommended to use 2-3 times a day ?

## 2022-11-16 NOTE — Progress Notes (Signed)
Subjective:  Patient ID: Haley Armstrong, female    DOB: [birth year]  Age: 77 y.o. MRN: [id number]  CC: Follow-up (6 mnth f/u)   HPI Haley Armstrong presents for arthralgias, feet/heels hurt, cervical pain, leg swelling L>>R Pt went to [facility] Pt saw Dr Charlsie Merles Pt saw Dr Danielle Dess   C/o elevated BP SBP140-160  Outpatient Medications Prior to Visit  Medication Sig Dispense Refill   amLODipine (NORVASC) 2.5 MG tablet Take 1 tablet (2.5 mg total) by mouth daily. Annual appt due in May must see provider for future refills 90 tablet 0   Apoaequorin (PREVAGEN) 10 MG CAPS Take by mouth.     buPROPion (WELLBUTRIN XL) 150 MG 24 hr tablet Take 1 tablet (150 mg total) by mouth daily. Take in am 90 tablet 1   Cholecalciferol (VITAMIN D3) 50 MCG (2000 UT) capsule Take 1 capsule (2,000 Units total) by mouth daily. 100 capsule 3   diclofenac sodium (VOLTAREN) 1 % GEL Apply 4 g topically 4 (four) times daily. 100 Tube 11   fluticasone (FLONASE) 50 MCG/ACT nasal spray Place 1 spray into both nostrils daily. 16 g 0   hyoscyamine (LEVBID) 0.375 MG 12 hr tablet TAKE 1 TABLET BY MOUTH TWICE A DAY Annual appt is due must see provider for future refills 60 tablet 0   hyoscyamine (LEVSIN SL) 0.125 MG SL tablet DISSOLVE 1-2 TABLETS UNDER THE TONGUE EVERY FOUR HOURS AS NEEDED FOR PAIN 100 tablet 2   ipratropium (ATROVENT) 0.03 % nasal spray ipratropium bromide 21 mcg (0.03 %) nasal spray     levothyroxine (SYNTHROID) 25 MCG tablet TAKE ONE TABLET BY MOUTH EVERY DAY BEFORE BREAKFAST 90 tablet 1   LORazepam (ATIVAN) 0.5 MG tablet Take 1 by mouth every night at bedtime as needed for sleep 30 tablet 3   Lutein-Zeaxanthin 25-5 MG CAPS Take by mouth. Takes one daily     meclizine (ANTIVERT) 12.5 MG tablet Take 1-2 tablets (12.5-25 mg total) by mouth 3 (three) times daily as needed for dizziness. 30 tablet 0   meloxicam (MOBIC) 15 MG tablet TAKE 1 TABLET BY MOUTH EVERY DAY AFTER SURGERY AS NEEDED 90 tablet 0    methocarbamol (ROBAXIN) 500 MG tablet TAKE 1 TABLET BY MOUTH EVERY 6 HOURS AS NEEDED. 120 tablet 3   Multiple Vitamins-Minerals (MULTIVITAMIN,TX-MINERALS) tablet Take 1 tablet by mouth daily.       NON FORMULARY Vitamin C, Osteo BiFlex, Vitamin B12 - PO daily     Omega 3 1000 MG CAPS Take 2 capsules by mouth daily.     ondansetron (ZOFRAN-ODT) 4 MG disintegrating tablet Take 1 tablet (4 mg total) by mouth every 8 (eight) hours as needed for nausea or vomiting. 5 tablet 0   pantoprazole (PROTONIX) 40 MG tablet TAKE 1 TABLET BY MOUTH EVERY DAY 90 tablet 3   pregabalin (LYRICA) 150 MG capsule Take 150 mg by mouth 2 (two) times daily.     SUMAtriptan (IMITREX) 100 MG tablet Take 1 tablet (100 mg total) by mouth every 2 (two) hours as needed for migraine. May repeat in 2 hours if headache persists or recurs. 10 tablet 2   traMADol (ULTRAM) 50 MG tablet Take 1-2 tablets (50-100 mg total) by mouth 3 (three) times daily as needed for severe pain. 90 tablet 1   enalapril (VASOTEC) 5 MG tablet TAKE 1 TABLET BY MOUTH TWICE A DAY 180 tablet 0   tiZANidine (ZANAFLEX) 2 MG tablet Take 1 tablet (2 mg total) by mouth at  bedtime as needed for muscle spasms. 30 tablet 3   No facility-administered medications prior to visit.    ROS: Review of Systems  Constitutional:  Positive for fatigue. Negative for activity change, appetite change, chills and unexpected weight change.  HENT:  Negative for congestion, mouth sores and sinus pressure.   Eyes:  Negative for visual disturbance.  Respiratory:  Negative for cough and chest tightness.   Cardiovascular:  Positive for leg swelling.  Gastrointestinal:  Negative for abdominal pain and nausea.  Genitourinary:  Negative for difficulty urinating, frequency and vaginal pain.  Musculoskeletal:  Positive for arthralgias, back pain, gait problem, joint swelling, neck pain and neck stiffness.  Skin:  Negative for pallor and rash.  Neurological:  Negative for dizziness,  tremors, weakness, numbness and headaches.  Hematological:  Does not bruise/bleed easily.  Psychiatric/Behavioral:  Negative for confusion, sleep disturbance and suicidal ideas. The patient is not nervous/anxious.     Objective:  BP 126/80 (BP Location: Right Arm, Patient Position: Sitting, Cuff Size: Large)   Pulse 75   Temp 98.2 F (36.8 C) (Oral)   Ht 5\' 4"  (1.626 m)   Wt 140 lb (63.5 kg)   SpO2 99%   BMI 24.03 kg/m   BP Readings from Last 3 Encounters:  11/16/22 126/80  06/10/22 (!) 140/68  03/11/22 (!) 142/62    Wt Readings from Last 3 Encounters:  11/16/22 140 lb (63.5 kg)  06/10/22 139 lb 9.6 oz (63.3 kg)  03/11/22 141 lb 3.2 oz (64 kg)    Physical Exam Constitutional:      General: She is not in acute distress.    Appearance: She is well-developed. She is obese.  HENT:     Head: Normocephalic.     Right Ear: External ear normal.     Left Ear: External ear normal.     Nose: Nose normal.  Eyes:     General:        Right eye: No discharge.        Left eye: No discharge.     Conjunctiva/sclera: Conjunctivae normal.     Pupils: Pupils are equal, round, and reactive to light.  Neck:     Thyroid: No thyromegaly.     Vascular: No JVD.     Trachea: No tracheal deviation.  Cardiovascular:     Rate and Rhythm: Normal rate and regular rhythm.     Heart sounds: Normal heart sounds.  Pulmonary:     Effort: No respiratory distress.     Breath sounds: No stridor. No wheezing.  Abdominal:     General: Bowel sounds are normal. There is no distension.     Palpations: Abdomen is soft. There is no mass.     Tenderness: There is no abdominal tenderness. There is no guarding or rebound.  Musculoskeletal:        General: Tenderness present.     Cervical back: Normal range of motion and neck supple. No rigidity.     Right lower leg: No edema.     Left lower leg: Edema present.  Lymphadenopathy:     Cervical: No cervical adenopathy.  Skin:    Findings: Erythema present.  No rash.  Neurological:     Cranial Nerves: No cranial nerve deficit.     Motor: No abnormal muscle tone.     Coordination: Coordination normal.     Gait: Gait abnormal.     Deep Tendon Reflexes: Reflexes normal.  Psychiatric:        Behavior:  Behavior normal.        Thought Content: Thought content normal.        Judgment: Judgment normal.    LLE w/swelling 1+  Lab Results  Component Value Date   WBC 6.2 11/13/2022   HGB 12.9 11/13/2022   HCT 37.6 11/13/2022   PLT 322.0 11/13/2022   GLUCOSE 84 11/13/2022   CHOL 189 11/13/2022   TRIG 72.0 11/13/2022   HDL 66.60 11/13/2022   LDLCALC 108 (H) 11/13/2022   ALT 20 11/13/2022   AST 28 11/13/2022   NA 137 11/13/2022   K 5.0 11/13/2022   CL 101 11/13/2022   CREATININE 0.92 11/13/2022   BUN 18 11/13/2022   CO2 31 11/13/2022   TSH 3.27 07/29/2021   INR 0.90 08/05/2009   HGBA1C 5.8 05/08/2016    No results found.  Assessment & Plan:   Problem List Items Addressed This Visit     Anxiety disorder    Using Lorazepam sometimes  Potential benefits of a long term benzodiazepines  use as well as potential risks  and complications were explained to the patient and were aknowledged.      HTN (hypertension)    Increase Enalapril to 10 mg bid      Relevant Medications   enalapril (VASOTEC) 10 MG tablet   Osteoarthritis    Severe Tramadol prn  Potential benefits of a long term opioids use as well as potential risks (i.e. addiction risk, apnea etc) and complications (i.e. Somnolence, constipation and others) were explained to the patient and were aknowledged. Work less!      Edema - Primary    L>>R LE Check D dimer Compr sock      Relevant Orders   D-dimer, quantitative   Arthralgia    Tramadol prn - rare use  Potential benefits of a long term opioids use as well as potential risks (i.e. addiction risk, apnea etc) and complications (i.e. Somnolence, constipation and others) were explained to the patient and were  aknowledged. Blue-Emu cream was recommended to use 2-3 times a day      Relevant Orders   Sedimentation rate      Meds ordered this encounter  Medications   enalapril (VASOTEC) 10 MG tablet    Sig: Take 1 tablet (10 mg total) by mouth 2 (two) times daily.    Dispense:  60 tablet    Refill:  11      Follow-up: Return in about 3 months (around 02/16/2023) for a follow-up visit.  Sonda Primes, MD

## 2022-11-16 NOTE — Assessment & Plan Note (Signed)
L>>R LE Check D dimer Compr sock

## 2022-11-17 LAB — D-DIMER, QUANTITATIVE: D-Dimer, Quant: 1.32 mcg/mL FEU — ABNORMAL HIGH (ref <0.50)

## 2022-11-17 NOTE — Addendum Note (Signed)
Addended by: Tresa Garter on: 11/17/2022 07:16 AM   Modules accepted: Orders

## 2022-11-17 NOTE — Addendum Note (Signed)
Addended by: Deatra James on: 11/17/2022 04:06 PM   Modules accepted: Orders

## 2022-11-18 NOTE — Addendum Note (Signed)
Addended by: Deatra James on: 11/18/2022 08:55 AM   Modules accepted: Orders

## 2022-11-21 DIAGNOSIS — M25512 Pain in left shoulder: Secondary | ICD-10-CM | POA: Diagnosis not present

## 2022-11-24 ENCOUNTER — Ambulatory Visit
Admission: RE | Admit: 2022-11-24 | Discharge: 2022-11-24 | Disposition: A | Discharge status: Home / Self Care | Payer: Medicare Other | Source: Ambulatory Visit | Attending: Nurse Practitioner | Admitting: Nurse Practitioner

## 2022-11-24 VITALS — BP 121/66 | HR 88 | Temp 98.8°F | Resp 18

## 2022-11-24 DIAGNOSIS — R600 Localized edema: Secondary | ICD-10-CM | POA: Insufficient documentation

## 2022-11-24 DIAGNOSIS — M5412 Radiculopathy, cervical region: Secondary | ICD-10-CM | POA: Diagnosis not present

## 2022-11-24 DIAGNOSIS — R7989 Other specified abnormal findings of blood chemistry: Secondary | ICD-10-CM | POA: Insufficient documentation

## 2022-11-24 DIAGNOSIS — M542 Cervicalgia: Secondary | ICD-10-CM | POA: Diagnosis not present

## 2022-11-24 DIAGNOSIS — N3001 Acute cystitis with hematuria: Secondary | ICD-10-CM | POA: Insufficient documentation

## 2022-11-24 LAB — POCT URINALYSIS DIP (MANUAL ENTRY)
Bilirubin, UA: NEGATIVE
Glucose, UA: NEGATIVE mg/dL
Ketones, POC UA: NEGATIVE mg/dL
Nitrite, UA: NEGATIVE
Protein Ur, POC: 30 mg/dL — AB
Spec Grav, UA: 1.02 (ref 1.010–1.025)
Urobilinogen, UA: 0.2 E.U./dL
pH, UA: 6 (ref 5.0–8.0)

## 2022-11-24 MED ORDER — CEPHALEXIN 500 MG PO CAPS
500.0000 mg | ORAL_CAPSULE | Freq: Two times a day (BID) | ORAL | 0 refills | Status: AC
Start: 2022-11-24 — End: 2022-12-01

## 2022-11-24 NOTE — Discharge Instructions (Addendum)
Keflex twice daily for 7 days The clinic will contact you with results of the urine culture done today if positive Rest and fluids Follow-up with your PCP if symptoms do not improve Please go to the ER for any worsening symptoms

## 2022-11-24 NOTE — ED Provider Notes (Signed)
[facility]    CSN: [id number] Arrival date & time: 11/24/22  0806      History   Chief Complaint Chief Complaint  Patient presents with   Urinary Frequency    HPI Haley Armstrong is a 77 y.o. female presents for evaluation of dysuria.  Patient reports 6 days of bladder spasms with urinary frequency.  Denies burning with urination, urgency, hematuria, fevers, nausea/vomiting, flank pain.  Does report she had chills last night but no documented fever.  No vaginal discharge or STD concern.  She does have a remote history of UTIs.  She has not taken any OTC medications for his symptoms.  No other concerns at this time.   Urinary Frequency    Past Medical History:  Diagnosis Date   Anemia, iron deficiency    Anxiety    Blood donor    Family history of coronary artery disease 06/04/2016   GERD (gastroesophageal reflux disease)    Hypertension    IBS (irritable bowel syndrome)    Osteoarthritis     Patient Active Problem List   Diagnosis Date Noted   Benign positional vertigo 07/06/2022   Presbycusis of both ears 07/06/2022   Dysuria 09/30/2021   History of COVID-19 09/30/2021   Vertigo 09/30/2021   Mastoid pain, left 09/30/2021   Reactive depression (situational) 07/29/2021   Acute non-recurrent frontal sinusitis 05/14/2021   Nasal sore 05/14/2021   DVT prophylaxis 01/14/2021   Uterine prolapse 09/16/2020   Paronychia of great toe 09/03/2020   OAB (overactive bladder) 09/03/2020   Acute sinusitis 06/13/2020   Migraines 01/04/2020   Arthralgia 01/04/2020   Left leg pain 07/26/2019   Vestibular neuritis 06/27/2019   Rotator cuff tear arthropathy, right 05/18/2019   Family history of coronary artery disease 06/04/2016   Cellulitis of foot 02/07/2016   Chest pain 05/16/2015   Degenerative spondylolisthesis 12/06/2013   Well adult exam 03/09/2013   Edema 03/09/2013   Pain in left foot 03/08/2013   ADD (attention deficit disorder) 06/15/2012    Compulsive behavior disorder (HCC) 06/15/2012   Acute left lumbar radiculopathy 10/18/2011   Iron deficiency anemia 10/21/2010   History of esophageal spasm 10/21/2010   FATIGUE, ACUTE 07/02/2009   TOBACCO USE, QUIT 07/02/2009   NONSPEC ABN FINDNG RAD & OTH EXAM ABDOMINAL AREA 04/11/2009   Diarrhea 04/09/2009   ABDOMINAL PAIN-EPIGASTRIC 04/09/2009   Anxiety disorder 02/24/2009   Rash and nonspecific skin eruption 09/30/2007   Memory loss 09/21/2007   Goiter, unspecified 09/09/2007   HTN (hypertension) 05/24/2007   PARESTHESIA 05/24/2007   Cervicalgia 05/20/2007   Other abnormal glucose 05/20/2007   GERD 04/06/2007   Irritable bowel syndrome 04/06/2007   Osteoarthritis 04/06/2007   LOW BACK PAIN 04/06/2007   TROCHANTERIC BURSITIS 04/06/2007    Past Surgical History:  Procedure Laterality Date   APPENDECTOMY     CERVICAL FUSION  2011   C4-5 Dr Alveda Reasons   CHOLECYSTECTOMY     ESOPHAGEAL MANOMETRY N/A 11/13/2013   Procedure: ESOPHAGEAL MANOMETRY (EM);  Surgeon: Hilarie Fredrickson, MD;  Location: [facility];  Service: Endoscopy;  Laterality: N/A;    OB History   No obstetric history on file.      Home Medications    Prior to Admission medications   Medication Sig Start Date End Date Taking? Authorizing Provider  cephALEXin (KEFLEX) 500 MG capsule Take 1 capsule (500 mg total) by mouth 2 (two) times daily for 7 days. 11/24/22 12/01/22 Yes Radford Pax, NP  amLODipine (NORVASC)  2.5 MG tablet Take 1 tablet (2.5 mg total) by mouth daily. 11/16/22   [name], Georgina Quint, MD  Apoaequorin (PREVAGEN) 10 MG CAPS Take by mouth.    [provider]  buPROPion (WELLBUTRIN XL) 150 MG 24 hr tablet Take 1 tablet (150 mg total) by mouth daily. Take in am 06/10/22   [name], Georgina Quint, MD  Cholecalciferol (VITAMIN D3) 50 MCG (2000 UT) capsule Take 1 capsule (2,000 Units total) by mouth daily. 01/04/20   [name], Georgina Quint, MD  diclofenac sodium (VOLTAREN) 1 % GEL Apply 4 g topically 4 (four)  times daily. 01/08/16   Lenn Sink, DPM  enalapril (VASOTEC) 10 MG tablet Take 1 tablet (10 mg total) by mouth 2 (two) times daily. 11/16/22   [name], Georgina Quint, MD  fluticasone (FLONASE) 50 MCG/ACT nasal spray Place 1 spray into both nostrils daily. 09/16/21   Petrucelli, Samantha R, PA-C  hyoscyamine (LEVBID) 0.375 MG 12 hr tablet TAKE 1 TABLET BY MOUTH TWICE A DAY Annual appt is due must see provider for future refills 11/12/22   [name], Georgina Quint, MD  hyoscyamine (LEVSIN SL) 0.125 MG SL tablet DISSOLVE 1-2 TABLETS UNDER THE TONGUE EVERY FOUR HOURS AS NEEDED FOR PAIN 03/13/20   [name], Georgina Quint, MD  ipratropium (ATROVENT) 0.03 % nasal spray ipratropium bromide 21 mcg (0.03 %) nasal spray    [provider]  levothyroxine (SYNTHROID) 25 MCG tablet TAKE ONE TABLET BY MOUTH EVERY DAY BEFORE BREAKFAST 11/16/22   [name], Georgina Quint, MD  LORazepam (ATIVAN) 0.5 MG tablet Take 1 by mouth every night at bedtime as needed for sleep 10/16/22   [name], Georgina Quint, MD  Lutein-Zeaxanthin 25-5 MG CAPS Take by mouth. Takes one daily    [provider]  meclizine (ANTIVERT) 12.5 MG tablet Take 1-2 tablets (12.5-25 mg total) by mouth 3 (three) times daily as needed for dizziness. 02/05/22   [name], Georgina Quint, MD  meloxicam (MOBIC) 15 MG tablet TAKE 1 TABLET BY MOUTH EVERY DAY AFTER SURGERY AS NEEDED 09/15/22   [name], Georgina Quint, MD  methocarbamol (ROBAXIN) 500 MG tablet TAKE 1 TABLET BY MOUTH EVERY 6 HOURS AS NEEDED. 08/24/22   [name], Georgina Quint, MD  Multiple Vitamins-Minerals (MULTIVITAMIN,TX-MINERALS) tablet Take 1 tablet by mouth daily.      [provider]  NON FORMULARY Vitamin C, Osteo BiFlex, Vitamin B12 - PO daily    [provider]  Omega 3 1000 MG CAPS Take 2 capsules by mouth daily.    [provider]  ondansetron (ZOFRAN-ODT) 4 MG disintegrating tablet Take 1 tablet (4 mg total) by mouth every 8 (eight) hours as needed for nausea or vomiting.  09/16/21   Petrucelli, Samantha R, PA-C  pantoprazole (PROTONIX) 40 MG tablet TAKE 1 TABLET BY MOUTH EVERY DAY 07/13/22   [name], Georgina Quint, MD  pregabalin (LYRICA) 150 MG capsule Take 150 mg by mouth 2 (two) times daily. 10/22/22   [provider]  SUMAtriptan (IMITREX) 100 MG tablet Take 1 tablet (100 mg total) by mouth every 2 (two) hours as needed for migraine. May repeat in 2 hours if headache persists or recurs. 01/03/21   [name], Georgina Quint, MD  traMADol (ULTRAM) 50 MG tablet Take 1-2 tablets (50-100 mg total) by mouth 3 (three) times daily as needed for severe pain. 10/30/22   [name], Georgina Quint, MD    Family History Family History  Problem Relation Age of Onset   Lymphoma Mother    Dementia Mother  Uterine cancer Mother 54       uterine sarcoma   Hypothyroidism Mother    Parkinsonism Father    Heart disease Brother        CAD   Heart attack Brother    Heart disease Maternal Aunt    Heart attack Maternal Aunt    Colon cancer Neg Hx    Esophageal cancer Neg Hx    Rectal cancer Neg Hx    Stomach cancer Neg Hx     Social History Social History   Tobacco Use   Smoking status: Former   Smokeless tobacco: Never  Building services engineer Use: Never used  Substance Use Topics   Alcohol use: Yes    Comment: occ   Drug use: No     Allergies   Patient has no known allergies.   Review of Systems Review of Systems  Genitourinary:  Positive for frequency.     Physical Exam Triage Vital Signs ED Triage Vitals [11/24/22 0821]  Enc Vitals Group     BP 121/66     Pulse Rate 88     Resp 18     Temp 98.8 F (37.1 C)     Temp Source Oral     SpO2 95 %     Weight      Height      Head Circumference      Peak Flow      Pain Score      Pain Loc      Pain Edu?      Excl. in GC?    No data found.  Updated Vital Signs BP 121/66 (BP Location: Right Arm)   Pulse 88   Temp 98.8 F (37.1 C) (Oral)   Resp 18   SpO2 95%   Visual Acuity Right Eye  Distance:   Left Eye Distance:   Bilateral Distance:    Right Eye Near:   Left Eye Near:    Bilateral Near:     Physical Exam Vitals and nursing note reviewed.  Constitutional:      Appearance: Normal appearance.  HENT:     Head: Normocephalic and atraumatic.  Eyes:     Pupils: Pupils are equal, round, and reactive to light.  Cardiovascular:     Rate and Rhythm: Normal rate.  Pulmonary:     Effort: Pulmonary effort is normal.  Abdominal:     Tenderness: There is no right CVA tenderness or left CVA tenderness.  Skin:    General: Skin is warm and dry.  Neurological:     General: No focal deficit present.     Mental Status: She is alert and oriented to person, place, and time.  Psychiatric:        Mood and Affect: Mood normal.        Behavior: Behavior normal.      UC Treatments / Results  Labs (all labs ordered are listed, but only abnormal results are displayed) Labs Reviewed  POCT URINALYSIS DIP (MANUAL ENTRY) - Abnormal; Notable for the following components:      Result Value   Clarity, UA turbid (*)    Blood, UA small (*)    Protein Ur, POC =30 (*)    Leukocytes, UA Large (3+) (*)    All other components within normal limits  URINE CULTURE   Comprehensive metabolic panel Order: [id number] Status: Final result     Visible to patient: Yes (seen)     Next appt: 11/25/2022 at  12:00 PM in Cardiology ([facility])     Dx: Essential hypertension   0 Result Notes          Component Ref Range & Units 11 d ago (11/13/22) 1 yr ago (09/15/21) 1 yr ago (07/29/21) 2 yr ago (09/03/20) 2 yr ago (01/04/20) 2 yr ago (01/04/20) 3 yr ago (07/26/19)  Sodium 135 - 145 mEq/L 137 134 Low  R 140 139 135 R  135  Potassium 3.5 - 5.1 mEq/L 5.0 4.8 R 4.3 5.3 High  5.1 R  4.1  Chloride 96 - 112 mEq/L 101 100 R 103 103 100 R  101  CO2 19 - 32 mEq/L 31 26 R 32 30 28 R  27  Glucose, Bld 70 - 99 mg/dL 84 161 High  CM 99 94 94 R, CM  87  BUN 6 - 23 mg/dL 18 15 R 21 17 9  R  15   Creatinine, Ser 0.40 - 1.20 mg/dL 0.96 0.45 R 4.09 8.11 9.14 R, CM  0.92  Total Bilirubin 0.2 - 1.2 mg/dL 0.4 0.4 R 0.3 0.3  0.5   Alkaline Phosphatase 39 - 117 U/L 51 58 R 56 59     AST 0 - 37 U/L 28 21 R 21 23  26  R   ALT 0 - 35 U/L 20 16 R 14 16  18  R   Total Protein 6.0 - 8.3 g/dL 6.7 7.1 R 6.5 6.6  6.5 R   Albumin 3.5 - 5.2 g/dL 4.0 3.6 R 4.0 4.1     GFR >60.00 mL/min 60.71  58.95 Low  CM 70.78 CM   59.89 Low   Comment: Calculated using the CKD-EPI Creatinine Equation (2021)  Calcium 8.4 - 10.5 mg/dL 9.1 8.8 Low  R 9.0 9.3 9.5 R  9.4  Resulting Agency [place] [facility] [place] [facility]              EKG   Radiology No results found.  Procedures Procedures (including critical care time)  Medications Ordered in UC Medications - No data to display  Initial Impression / Assessment and Plan / UC Course  I have reviewed the triage vital signs and the nursing notes.  Pertinent labs & imaging results that were available during my care of the patient were reviewed by me and considered in my medical decision making (see chart for details).     09 34: Reviewed exam and symptoms with patient.  After an hour and a half patient was still unable to leave a urine sample.  She states she had to go to work.  Advised patient she can return to clinic to provide sample and we will complete visit at that time.  Patient verbalized understanding.  1338: Patient returned to provide urine sample.  Positive UTI, will culture.  Start Keflex.  PCP follow-up if symptoms do not improve.  ER precautions reviewed and patient verbalized understanding Final Clinical Impressions(s) / UC Diagnoses   Final diagnoses:  Acute cystitis with hematuria     Discharge Instructions      Keflex twice daily for 7 days The clinic will contact you with results of the urine culture done today if positive Rest and fluids Follow-up with your PCP if  symptoms do not improve Please go to the ER for any worsening symptoms     ED Prescriptions     Medication Sig Dispense Auth. Provider   cephALEXin (KEFLEX) 500 MG capsule  Take 1 capsule (500 mg total) by mouth 2 (two) times daily for 7 days. 14 capsule Radford Pax, NP      PDMP not reviewed this encounter.   Radford Pax, NP 11/24/22 1339

## 2022-11-24 NOTE — ED Triage Notes (Signed)
Pt presents to UC w/ c/o bladder spasms, urinary frequency x6 days. States she had chills last night but unsure if she had a fever.

## 2022-11-25 ENCOUNTER — Ambulatory Visit (INDEPENDENT_AMBULATORY_CARE_PROVIDER_SITE_OTHER)
Admission: RE | Admit: 2022-11-25 | Discharge: 2022-11-25 | Disposition: A | Discharge status: Home / Self Care | Payer: Medicare Other | Source: Ambulatory Visit | Attending: Internal Medicine | Admitting: Internal Medicine

## 2022-11-25 DIAGNOSIS — R7989 Other specified abnormal findings of blood chemistry: Secondary | ICD-10-CM

## 2022-11-25 DIAGNOSIS — N3001 Acute cystitis with hematuria: Secondary | ICD-10-CM | POA: Diagnosis not present

## 2022-11-25 DIAGNOSIS — R600 Localized edema: Secondary | ICD-10-CM | POA: Diagnosis not present

## 2022-11-25 LAB — URINE CULTURE

## 2022-11-26 LAB — URINE CULTURE: Culture: >=100000 — AB

## 2022-11-27 ENCOUNTER — Ambulatory Visit (INDEPENDENT_AMBULATORY_CARE_PROVIDER_SITE_OTHER): Payer: Medicare Other

## 2022-11-27 ENCOUNTER — Other Ambulatory Visit: Payer: Self-pay | Admitting: Internal Medicine

## 2022-11-27 VITALS — Ht 64.0 in | Wt 140.0 lb

## 2022-11-27 DIAGNOSIS — Z0000 Encounter for general adult medical examination without abnormal findings: Secondary | ICD-10-CM

## 2022-11-27 DIAGNOSIS — M19012 Primary osteoarthritis, left shoulder: Secondary | ICD-10-CM | POA: Diagnosis not present

## 2022-11-27 NOTE — Progress Notes (Addendum)
Subjective:   Haley Armstrong is a 77 y.o. female who presents for Medicare Annual (Subsequent) preventive examination.  Review of Systems    Virtual Visit via Telephone Note  I connected with  Haley Armstrong on 11/27/22 at 11:15 AM EDT by telephone and verified that I am speaking with the correct person using two identifiers.  Location: Patient: Home Provider: Office Persons participating in the virtual visit: patient/Nurse Health Advisor   I discussed the limitations, risks, security and privacy concerns of performing an evaluation and management service by telephone and the availability of in person appointments. The patient expressed understanding and agreed to proceed.  Interactive audio and video telecommunications were attempted between this nurse and patient, however failed, due to patient having technical difficulties OR patient did not have access to video capability.  We continued and completed visit with audio only.  Some vital signs may be absent or patient reported.   Haley Rung, LPN  Cardiac Risk Factors include: advanced age (>14men, >31 women);hypertension     Objective:    Today's Vitals   11/27/22 1126 11/27/22 1128  Weight: 140 lb (63.5 kg)   Height: 5\' 4"  (1.626 m)   PainSc:  0-No pain   Body mass index is 24.03 kg/m.     11/27/2022   11:39 AM 09/15/2021    4:52 PM 09/03/2020   10:59 AM 01/21/2020    1:29 PM 06/20/2019    1:22 PM  Advanced Directives  Does Patient Have a Medical Advance Directive? No No No No No  Would patient like information on creating a medical advance directive? No - Patient declined  No - Patient declined      Current Medications (verified) Outpatient Encounter Medications as of 11/27/2022  Medication Sig   amLODipine (NORVASC) 2.5 MG tablet Take 1 tablet (2.5 mg total) by mouth daily.   Apoaequorin (PREVAGEN) 10 MG CAPS Take by mouth.   buPROPion (WELLBUTRIN XL) 150 MG 24 hr tablet TAKE 1 TABLET (150 MG TOTAL) BY  MOUTH DAILY. TAKE IN AM   cephALEXin (KEFLEX) 500 MG capsule Take 1 capsule (500 mg total) by mouth 2 (two) times daily for 7 days.   Cholecalciferol (VITAMIN D3) 50 MCG (2000 UT) capsule Take 1 capsule (2,000 Units total) by mouth daily.   diclofenac sodium (VOLTAREN) 1 % GEL Apply 4 g topically 4 (four) times daily.   enalapril (VASOTEC) 10 MG tablet Take 1 tablet (10 mg total) by mouth 2 (two) times daily.   fluticasone (FLONASE) 50 MCG/ACT nasal spray Place 1 spray into both nostrils daily.   hyoscyamine (LEVBID) 0.375 MG 12 hr tablet TAKE 1 TABLET BY MOUTH TWICE A DAY Annual appt is due must see provider for future refills   hyoscyamine (LEVSIN SL) 0.125 MG SL tablet DISSOLVE 1-2 TABLETS UNDER THE TONGUE EVERY FOUR HOURS AS NEEDED FOR PAIN   ipratropium (ATROVENT) 0.03 % nasal spray ipratropium bromide 21 mcg (0.03 %) nasal spray   levothyroxine (SYNTHROID) 25 MCG tablet TAKE ONE TABLET BY MOUTH EVERY DAY BEFORE BREAKFAST   LORazepam (ATIVAN) 0.5 MG tablet Take 1 by mouth every night at bedtime as needed for sleep   Lutein-Zeaxanthin 25-5 MG CAPS Take by mouth. Takes one daily   meclizine (ANTIVERT) 12.5 MG tablet Take 1-2 tablets (12.5-25 mg total) by mouth 3 (three) times daily as needed for dizziness.   meloxicam (MOBIC) 15 MG tablet TAKE 1 TABLET BY MOUTH EVERY DAY AFTER SURGERY AS NEEDED   methocarbamol (  ROBAXIN) 500 MG tablet TAKE 1 TABLET BY MOUTH EVERY 6 HOURS AS NEEDED.   Multiple Vitamins-Minerals (MULTIVITAMIN,TX-MINERALS) tablet Take 1 tablet by mouth daily.     NON FORMULARY Vitamin C, Osteo BiFlex, Vitamin B12 - PO daily   Omega 3 1000 MG CAPS Take 2 capsules by mouth daily.   ondansetron (ZOFRAN-ODT) 4 MG disintegrating tablet Take 1 tablet (4 mg total) by mouth every 8 (eight) hours as needed for nausea or vomiting.   pantoprazole (PROTONIX) 40 MG tablet TAKE 1 TABLET BY MOUTH EVERY DAY   pregabalin (LYRICA) 150 MG capsule Take 150 mg by mouth 2 (two) times daily.    SUMAtriptan (IMITREX) 100 MG tablet Take 1 tablet (100 mg total) by mouth every 2 (two) hours as needed for migraine. May repeat in 2 hours if headache persists or recurs.   traMADol (ULTRAM) 50 MG tablet Take 1-2 tablets (50-100 mg total) by mouth 3 (three) times daily as needed for severe pain.   No facility-administered encounter medications on file as of 11/27/2022.    Allergies (verified) Patient has no known allergies.   History: Past Medical History:  Diagnosis Date   Anemia, iron deficiency    Anxiety    Blood donor    Family history of coronary artery disease 06/04/2016   GERD (gastroesophageal reflux disease)    Hypertension    IBS (irritable bowel syndrome)    Osteoarthritis    Past Surgical History:  Procedure Laterality Date   APPENDECTOMY     CERVICAL FUSION  2011   C4-5 Dr Alveda Reasons   CHOLECYSTECTOMY     ESOPHAGEAL MANOMETRY N/A 11/13/2013   Procedure: ESOPHAGEAL MANOMETRY (EM);  Surgeon: Hilarie Fredrickson, MD;  Location: [facility];  Service: Endoscopy;  Laterality: N/A;   Family History  Problem Relation Age of Onset   Lymphoma Mother    Dementia Mother    Uterine cancer Mother 91       uterine sarcoma   Hypothyroidism Mother    Parkinsonism Father    Heart disease Brother        CAD   Heart attack Brother    Heart disease Maternal Aunt    Heart attack Maternal Aunt    Colon cancer Neg Hx    Esophageal cancer Neg Hx    Rectal cancer Neg Hx    Stomach cancer Neg Hx    Social History   Socioeconomic History   Marital status: Married    Spouse name: Not on file   Number of children: Not on file   Years of education: Not on file   Highest education level: Not on file  Occupational History   Occupation: Counsellor    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         Social Determinants of Health   Financial Resource Strain: Low Risk  (11/27/2022)   Overall Financial Resource Strain (CARDIA)    Difficulty of Paying Living Expenses: Not hard at all  Food Insecurity: No Food Insecurity (11/27/2022)   Hunger Vital Sign  Worried About Programme researcher, broadcasting/film/video in the Last Year: Never true    Ran Out of Food in the Last Year: Never true  Transportation Needs: No Transportation Needs (11/27/2022)   PRAPARE - Administrator, Civil Service (Medical): No    Lack of Transportation (Non-Medical): No  Physical Activity: Inactive (11/27/2022)   Exercise Vital Sign    Days of Exercise per Week: 0 days    Minutes of Exercise per Session: 0 min  Stress: No Stress Concern Present (11/27/2022)   [employer] of Occupational Health - Occupational Stress Questionnaire    Feeling of Stress : Not at all  Social Connections: Socially Integrated (11/27/2022)   Social Connection and Isolation Panel [NHANES]    Frequency of Communication with Friends and Family: More than three times a week    Frequency of Social Gatherings with Friends and Family: More than three times a week    Attends Religious Services: More than 4 times per year    Active Member of [employer] or Organizations: Yes    Attends Engineer, structural: More than 4 times per year    Marital Status: Married    Tobacco Counseling Counseling given: Not Answered   Clinical Intake:  Pre-visit preparation completed: No  Pain : No/denies pain Pain Score: 0-No pain     BMI - recorded: 24.03 Nutritional Status: BMI of 19-24  Normal Nutritional Risks: None Diabetes: No  How often do you need to have someone help you when you read instructions, pamphlets, or other written materials from your doctor or pharmacy?: 1 -  Never  Diabetic?  No  Interpreter Needed?: No  Information entered by :: Theresa Mulligan LPN   Activities of Daily Living    11/27/2022   11:36 AM  In your present state of health, do you have any difficulty performing the following activities:  Hearing? 1  Comment Wears hearing aids  Vision? 0  Difficulty concentrating or making decisions? 0  Walking or climbing stairs? 0  Dressing or bathing? 0  Doing errands, shopping? 0  Preparing Food and eating ? N  Using the Toilet? N  In the past six months, have you accidently leaked urine? N  Do you have problems with loss of bowel control? N  Managing your Medications? N  Managing your Finances? N  Housekeeping or managing your Housekeeping? N    Patient Care Team: [name], Georgina Quint, MD as PCP - General Delfin Gant, MD as Attending Physician (Family Medicine) Maeola Harman, MD as Consulting Physician (Neurosurgery) Quintella Reichert, MD as Consulting Physician (Cardiology) Herschel Senegal, OD as Consulting Physician (Optometry)  Indicate any recent Medical Services you may have received from other than [facility] providers in the past year (date may be approximate).     Assessment:   This is a routine wellness examination for Marci.  Hearing/Vision screen Hearing Screening - Comments:: Wears hearing aids Vision Screening - Comments:: Wears rx glasses - up to date with routine eye exams with  [facility]  Dietary issues and exercise activities discussed: Exercise limited by: None identified   Goals Addressed               This Visit's Progress     Get healthy (pt-stated)         Depression Screen    11/27/2022   11:35 AM 11/16/2022   10:15 AM 06/10/2022   10:50 AM 09/30/2021   10:34 AM 09/03/2020   12:14 PM 09/03/2020  11:26 AM 05/18/2019   10:43 AM  PHQ 2/9 Scores  PHQ - 2 Score 0 2 1 0 0 0 1  PHQ- 9 Score 0 5 2 0  0     Fall Risk    11/27/2022   11:37 AM 06/10/2022   10:47 AM 09/30/2021   10:34 AM  09/03/2020   12:14 PM 01/04/2020    2:47 PM  Fall Risk   Falls in the past year? 1 1 0 0 0  Number falls in past yr: 1 0 0 0 0  Comment  was having vertigo issues     Injury with Fall? 0 0 0 0 0  Comment No medical attention needed      Risk for fall due to : No Fall Risks Impaired balance/gait;Medication side effect No Fall Risks No Fall Risks No Fall Risks  Follow up Falls prevention discussed  Falls evaluation completed Falls evaluation completed     FALL RISK PREVENTION PERTAINING TO THE HOME:  Any stairs in or around the home? No  If so, are there any without handrails? No  Home free of loose throw rugs in walkways, pet beds, electrical cords, etc? Yes  Adequate lighting in your home to reduce risk of falls ? Yes   ASSISTIVE DEVICES UTILIZED TO PREVENT FALLS:  Life alert? No  Use of a cane, walker or w/c? No  Grab bars in the bathroom? No  Shower chair or bench in shower? No  Elevated toilet seat or a handicapped toilet? No   TIMED UP AND GO:  Was the test performed? No . Audio Visit   Cognitive Function:        11/27/2022   11:39 AM  6CIT Screen  What Year? 0 points  What month? 0 points  What time? 0 points  Count back from 20 0 points  Months in reverse 0 points  Repeat phrase 0 points  Total Score 0 points    Immunizations Immunization History  Administered Date(s) Administered   Fluad Quad(high Dose 65+) 04/13/2019, 04/26/2020, 05/03/2021, 04/22/2022   Influenza Split 03/16/2011   Influenza Whole 05/24/2007, 03/21/2008, 05/29/2009, 05/06/2010   Influenza, High Dose Seasonal PF 04/26/2017   Influenza,inj,Quad PF,6+ Mos 03/25/2013, 03/21/2014, 04/18/2015, 03/05/2016   Influenza,inj,quad, With Preservative 04/15/2019   PFIZER Comirnaty(Gray Top)Covid-19 Tri-Sucrose Vaccine 01/16/2021   PFIZER(Purple Top)SARS-COV-2 Vaccination 08/12/2019, 09/06/2019, 05/31/2020   Pneumococcal Conjugate-13 04/18/2015   Pneumococcal Polysaccharide-23 06/07/2013    Respiratory Syncytial Virus Vaccine,Recomb Aduvanted(Arexvy) 06/17/2022   Td 04/02/2004   Tdap 03/25/2013   Zoster Recombinat (Shingrix) 11/09/2017, 01/14/2018   Zoster, Live 11/09/2007    TDAP status: Up to date  Flu Vaccine status: Up to date  Pneumococcal vaccine status: Up to date  Covid-19 vaccine status: Completed vaccines  Qualifies for Shingles Vaccine? Yes   Zostavax completed Yes   Shingrix Completed?: Yes  Screening Tests Health Maintenance  Topic Date Due   COVID-19 Vaccine (5 - 2023-24 season) 12/13/2022 (Originally 02/27/2022)   INFLUENZA VACCINE  01/28/2023   DTaP/Tdap/Td (3 - Td or Tdap) 03/26/2023   Medicare Annual Wellness (AWV)  11/27/2023   Pneumonia Vaccine 74+ Years old  Completed   DEXA SCAN  Completed   Hepatitis C Screening  Completed   Zoster Vaccines- Shingrix  Completed   HPV VACCINES  Aged Out   Colonoscopy  Discontinued    Health Maintenance  There are no preventive care reminders to display for this patient.   Colorectal cancer screening: No longer required.  Mammogram status: No longer required due to Age.  Bone Density status: Completed 05/18/19. Results reflect: Bone density results: OSTEOPOROSIS. Repeat every   years.  Lung Cancer Screening: (Low Dose CT Chest recommended if Age 5-80 years, 30 pack-year currently smoking OR have quit w/in 15years.) does not qualify.     Additional Screening:  Hepatitis C Screening: does qualify; Completed 05/08/16  Vision Screening: Recommended annual ophthalmology exams for early detection of glaucoma and other disorders of the eye. Is the patient up to date with their annual eye exam?  Yes  Who is the provider or what is the name of the office in which the patient attends annual eye exams? [facility] If pt is not established with a provider, would they like to be referred to a provider to establish care? No .   Dental Screening: Recommended annual dental exams for proper oral  hygiene  Community Resource Referral / Chronic Care Management:  CRR required this visit?  No   CCM required this visit?  No      Plan:     I have personally reviewed and noted the following in the patient's chart:   Medical and social history Use of alcohol, tobacco or illicit drugs  Current medications and supplements including opioid prescriptions. Patient is currently taking opioid prescriptions. Information provided to patient regarding non-opioid alternatives. Patient advised to discuss non-opioid treatment plan with their provider. Functional ability and status Nutritional status Physical activity Advanced directives List of other physicians Hospitalizations, surgeries, and ER visits in previous 12 months Vitals Screenings to include cognitive, depression, and falls Referrals and appointments  In addition, I have reviewed and discussed with patient certain preventive protocols, quality metrics, and best practice recommendations. A written personalized care plan for preventive services as well as general preventive health recommendations were provided to patient.     Haley Rung, LPN   02/06/9146   Nurse Notes: None

## 2022-11-27 NOTE — Patient Instructions (Addendum)
Haley Armstrong , Thank you for taking time to come for your Medicare Wellness Visit. I appreciate your ongoing commitment to your health goals. Please review the following plan we discussed and let me know if I can assist you in the future.   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 The date shown is not the original due date.   Opioid Pain Medicine Management Opioids are powerful medicines that are used to treat moderate to severe pain. When used for short periods of time, they can help you to: Sleep better. Do better in physical or occupational therapy. Feel better in the first few days after an injury. Recover from surgery. Opioids should be taken with the supervision of a trained health care provider. They should be taken for the shortest period of time possible. This is because opioids can be addictive, and the longer you take opioids, the greater your risk of addiction. This addiction can also be called opioid use disorder. What are the risks? Using opioid pain medicines for longer than 3 days increases your risk of side effects. Side effects include: Constipation. Nausea and vomiting. Breathing difficulties (respiratory depression). Drowsiness. Confusion. Opioid use disorder. Itching. Taking opioid pain medicine for a  long period of time can affect your ability to do daily tasks. It also puts you at risk for: Motor vehicle crashes. Depression. Suicide. Heart attack. Overdose, which can be life-threatening. What is a pain treatment plan? A pain treatment plan is an agreement between you and your health care provider. Pain is unique to each person, and treatments vary depending on your condition. To manage your pain, you and your health care provider need to work together. To help you do this: Discuss the goals of your treatment, including how much pain you might expect to have and how you will manage the pain. Review the risks and benefits of taking opioid medicines. Remember that a good treatment plan uses more than one approach and minimizes the chance of side effects. Be honest about the amount of medicines you take and about any drug or alcohol use. Get pain medicine prescriptions from only one health care provider. Pain can be managed with many types of alternative treatments. Ask your health care provider to refer you to one or more specialists who can help you manage pain through: Physical or occupational therapy. Counseling (cognitive behavioral therapy). Good nutrition. Biofeedback. Massage. Meditation. Non-opioid medicine. Following a gentle exercise program. How to use opioid pain medicine Taking medicine Take your pain medicine exactly as told by your health care provider. Take it only when you need it. If your pain gets less severe, you may take less  than your prescribed dose if your health care provider approves. If you are not having pain, do nottake pain medicine unless your health care provider tells you to take it. If your pain is severe, do nottry to treat it yourself by taking more pills than instructed on your prescription. Contact your health care provider for help. Write down the times when you take your pain medicine. It is easy to become confused while on pain medicine. Writing  the time can help you avoid overdose. Take other over-the-counter or prescription medicines only as told by your health care provider. Keeping yourself and others safe  While you are taking opioid pain medicine: Do not drive, use machinery, or power tools. Do not sign legal documents. Do not drink alcohol. Do not take sleeping pills. Do not supervise children by yourself. Do not do activities that require climbing or being in high places. Do not go to a lake, river, ocean, spa, or swimming pool. Do not share your pain medicine with anyone. Keep pain medicine in a locked cabinet or in a secure area where pets and children cannot reach it. Stopping your use of opioids If you have been taking opioid medicine for more than a few weeks, you may need to slowly decrease (taper) how much you take until you stop completely. Tapering your use of opioids can decrease your risk of symptoms of withdrawal, such as: Pain and cramping in the abdomen. Nausea. Sweating. Sleepiness. Restlessness. Uncontrollable shaking (tremors). Cravings for the medicine. Do not attempt to taper your use of opioids on your own. Talk with your health care provider about how to do this. Your health care provider may prescribe a step-down schedule based on how much medicine you are taking and how long you have been taking it. Getting rid of leftover pills Do not save any leftover pills. Get rid of leftover pills safely by: Taking the medicine to a prescription take-back program. This is usually offered by the county or law enforcement. Bringing them to a pharmacy that has a drug disposal container. Flushing them down the toilet. Check the label or package insert of your medicine to see whether this is safe to do. Throwing them out in the trash. Check the label or package insert of your medicine to see whether this is safe to do. If it is safe to throw it out, remove the medicine from the original container, put it into a  sealable bag or container, and mix it with used coffee grounds, food scraps, dirt, or cat litter before putting it in the trash. Follow these instructions at home: Activity Do exercises as told by your health care provider. Avoid activities that make your pain worse. Return to your normal activities as told by your health care provider. Ask your health care provider what activities are safe for you. General instructions You may need to take these actions to prevent or treat constipation: Drink enough fluid to keep your urine pale yellow. Take over-the-counter or prescription medicines. Eat foods that are high in fiber, such as beans, whole grains, and fresh fruits and vegetables. Limit foods that are high in fat and processed sugars, such as fried or sweet foods. Keep all follow-up visits. This is important. Where to find support If you have been taking opioids for a long time, you may benefit from receiving support for quitting from a local support group or counselor. Ask your health care provider for a referral to these resources in your area. Where to find more  information Centers for Disease Control and Prevention (CDC): FootballExhibition.com.br [place] Food and Drug Administration (FDA): PumpkinSearch.com.ee Get help right away if: You may have taken too much of an opioid (overdosed). Common symptoms of an overdose: Your breathing is slower or more shallow than normal. You have a very slow heartbeat (pulse). You have slurred speech. You have nausea and vomiting. Your pupils become very small. You have other potential symptoms: You are very confused. You faint or feel like you will faint. You have cold, clammy skin. You have blue lips or fingernails. You have thoughts of harming yourself or harming others. These symptoms may represent a serious problem that is an emergency. Do not wait to see if the symptoms will go away. Get medical help right away. Call your local emergency services (911 in the [place] Do  not drive yourself to the hospital.  If you ever feel like you may hurt yourself or others, or have thoughts about taking your own life, get help right away. Go to your nearest emergency department or: Call your local emergency services (911 in the [place] Call the [facility] (234-500-2662 in the [place] Call a suicide crisis helpline, such as the National Suicide Prevention Lifeline at (908) 528-9365 or 988 in the [place] This is open 24 hours a day in the [place] Text the Crisis Text Line at (407)149-1554 (in the [place] Summary Opioid medicines can help you manage moderate to severe pain for a short period of time. A pain treatment plan is an agreement between you and your health care provider. Discuss the goals of your treatment, including how much pain you might expect to have and how you will manage the pain. If you think that you or someone else may have taken too much of an opioid, get medical help right away. This information is not intended to replace advice given to you by your health care provider. Make sure you discuss any questions you have with your health care provider. Document Revised: 01/08/2021 Document Reviewed: 09/25/2020 Elsevier Patient Education  2024 [employer]  Advanced directives: Advance directive discussed with you today. Even though you declined this today, please call our office should you change your mind, and we can give you the proper paperwork for you to fill out.   Conditions/risks identified: None  Next appointment: Follow up in one year for your annual wellness visit    Preventive Care 65 Years and Older, Female Preventive care refers to lifestyle choices and visits with your health care provider that can promote health and wellness. What does preventive care include? A yearly physical exam. This is also called an annual well check. Dental exams once or twice a year. Routine eye exams. Ask your health care provider how often you should have  your eyes checked. Personal lifestyle choices, including: Daily care of your teeth and gums. Regular physical activity. Eating a healthy diet. Avoiding tobacco and drug use. Limiting alcohol use. Practicing safe sex. Taking low-dose aspirin every day. Taking vitamin and mineral supplements as recommended by your health care provider. What happens during an annual well check? The services and screenings done by your health care provider during your annual well check will depend on your age, overall health, lifestyle risk factors, and family history of disease. Counseling  Your health care provider may ask you questions about your: Alcohol use. Tobacco use. Drug use. Emotional well-being. Home and relationship well-being. Sexual activity. Eating habits. History of falls. Memory and ability to understand (cognition). Work and work  environment. Reproductive health. Screening  You may have the following tests or measurements: Height, weight, and BMI. Blood pressure. Lipid and cholesterol levels. These may be checked every 5 years, or more frequently if you are over 108 years old. Skin check. Lung cancer screening. You may have this screening every year starting at age 28 if you have a 30-pack-year history of smoking and currently smoke or have quit within the past 15 years. Fecal occult blood test (FOBT) of the stool. You may have this test every year starting at age 67. Flexible sigmoidoscopy or colonoscopy. You may have a sigmoidoscopy every 5 years or a colonoscopy every 10 years starting at age 21. Hepatitis C blood test. Hepatitis B blood test. Sexually transmitted disease (STD) testing. Diabetes screening. This is done by checking your blood sugar (glucose) after you have not eaten for a while (fasting). You may have this done every 1-3 years. Bone density scan. This is done to screen for osteoporosis. You may have this done starting at age 63. Mammogram. This may be done every  1-2 years. Talk to your health care provider about how often you should have regular mammograms. Talk with your health care provider about your test results, treatment options, and if necessary, the need for more tests. Vaccines  Your health care provider may recommend certain vaccines, such as: Influenza vaccine. This is recommended every year. Tetanus, diphtheria, and acellular pertussis (Tdap, Td) vaccine. You may need a Td booster every 10 years. Zoster vaccine. You may need this after age 57. Pneumococcal 13-valent conjugate (PCV13) vaccine. One dose is recommended after age 84. Pneumococcal polysaccharide (PPSV23) vaccine. One dose is recommended after age 1. Talk to your health care provider about which screenings and vaccines you need and how often you need them. This information is not intended to replace advice given to you by your health care provider. Make sure you discuss any questions you have with your health care provider. Document Released: 07/12/2015 Document Revised: 03/04/2016 Document Reviewed: 04/16/2015 Elsevier Interactive Patient Education  2017 [employer].  Fall Prevention in the Home Falls can cause injuries. They can happen to people of all ages. There are many things you can do to make your home safe and to help prevent falls. What can I do on the outside of my home? Regularly fix the edges of walkways and driveways and fix any cracks. Remove anything that might make you trip as you walk through a door, such as a raised step or threshold. Trim any bushes or trees on the path to your home. Use bright outdoor lighting. Clear any walking paths of anything that might make someone trip, such as rocks or tools. Regularly check to see if handrails are loose or broken. Make sure that both sides of any steps have handrails. Any raised decks and porches should have guardrails on the edges. Have any leaves, snow, or ice cleared regularly. Use sand or salt on walking  paths during winter. Clean up any spills in your garage right away. This includes oil or grease spills. What can I do in the bathroom? Use night lights. Install grab bars by the toilet and in the tub and shower. Do not use towel bars as grab bars. Use non-skid mats or decals in the tub or shower. If you need to sit down in the shower, use a plastic, non-slip stool. Keep the floor dry. Clean up any water that spills on the floor as soon as it happens. Remove soap buildup in  the tub or shower regularly. Attach bath mats securely with double-sided non-slip rug tape. Do not have throw rugs and other things on the floor that can make you trip. What can I do in the bedroom? Use night lights. Make sure that you have a light by your bed that is easy to reach. Do not use any sheets or blankets that are too big for your bed. They should not hang down onto the floor. Have a firm chair that has side arms. You can use this for support while you get dressed. Do not have throw rugs and other things on the floor that can make you trip. What can I do in the kitchen? Clean up any spills right away. Avoid walking on wet floors. Keep items that you use a lot in easy-to-reach places. If you need to reach something above you, use a strong step stool that has a grab bar. Keep electrical cords out of the way. Do not use floor polish or wax that makes floors slippery. If you must use wax, use non-skid floor wax. Do not have throw rugs and other things on the floor that can make you trip. What can I do with my stairs? Do not leave any items on the stairs. Make sure that there are handrails on both sides of the stairs and use them. Fix handrails that are broken or loose. Make sure that handrails are as long as the stairways. Check any carpeting to make sure that it is firmly attached to the stairs. Fix any carpet that is loose or worn. Avoid having throw rugs at the top or bottom of the stairs. If you do have  throw rugs, attach them to the floor with carpet tape. Make sure that you have a light switch at the top of the stairs and the bottom of the stairs. If you do not have them, ask someone to add them for you. What else can I do to help prevent falls? Wear shoes that: Do not have high heels. Have rubber bottoms. Are comfortable and fit you well. Are closed at the toe. Do not wear sandals. If you use a stepladder: Make sure that it is fully opened. Do not climb a closed stepladder. Make sure that both sides of the stepladder are locked into place. Ask someone to hold it for you, if possible. Clearly mark and make sure that you can see: Any grab bars or handrails. First and last steps. Where the edge of each step is. Use tools that help you move around (mobility aids) if they are needed. These include: Canes. Walkers. Scooters. Crutches. Turn on the lights when you go into a dark area. Replace any light bulbs as soon as they burn out. Set up your furniture so you have a clear path. Avoid moving your furniture around. If any of your floors are uneven, fix them. If there are any pets around you, be aware of where they are. Review your medicines with your doctor. Some medicines can make you feel dizzy. This can increase your chance of falling. Ask your doctor what other things that you can do to help prevent falls. This information is not intended to replace advice given to you by your health care provider. Make sure you discuss any questions you have with your health care provider. Document Released: 04/11/2009 Document Revised: 11/21/2015 Document Reviewed: 07/20/2014 Elsevier Interactive Patient Education  2017 [employer].

## 2022-12-02 ENCOUNTER — Encounter: Payer: Self-pay | Admitting: Internal Medicine

## 2022-12-06 ENCOUNTER — Ambulatory Visit
Admission: EM | Admit: 2022-12-06 | Discharge: 2022-12-06 | Disposition: A | Discharge status: Home / Self Care | Payer: Medicare Other | Attending: Urgent Care | Admitting: Urgent Care

## 2022-12-06 DIAGNOSIS — R300 Dysuria: Secondary | ICD-10-CM | POA: Diagnosis not present

## 2022-12-06 DIAGNOSIS — N3001 Acute cystitis with hematuria: Secondary | ICD-10-CM | POA: Insufficient documentation

## 2022-12-06 LAB — POCT URINALYSIS DIP (MANUAL ENTRY)
Bilirubin, UA: NEGATIVE
Glucose, UA: NEGATIVE mg/dL
Ketones, POC UA: NEGATIVE mg/dL
Nitrite, UA: NEGATIVE
Protein Ur, POC: NEGATIVE mg/dL
Spec Grav, UA: 1.01 (ref 1.010–1.025)
Urobilinogen, UA: 0.2 E.U./dL
pH, UA: 5.5 (ref 5.0–8.0)

## 2022-12-06 MED ORDER — CIPROFLOXACIN HCL 500 MG PO TABS: 500.0000 mg | ORAL_TABLET | Freq: Two times a day (BID) | ORAL | 0 refills | Status: DC

## 2022-12-06 NOTE — ED Triage Notes (Signed)
Pt reports bladder spasms, increase urinary frequency x 4 days. Reports she finished cephalexin on 11/30/22 for UTI.

## 2022-12-06 NOTE — ED Provider Notes (Signed)
[name]  Note:  This document was prepared using Conservation officer, historic buildings and may include unintentional dictation errors.  MRN: [id number] DOB: 08/17/1945  Subjective:   Haley Armstrong is a 77 y.o. female presenting for recheck on urinary tract infection.  Patient was just seen 11/24/2022 and completed a course of cephalexin.  Urine culture confirmed that she had an E. coli infection with no resistance.  Patient completed the course of antibiotics 11/30/2022.  Unfortunately in the past 4 days she has had return of her symptoms including urinary frequency, urinary urgency and a sensation of bladder spasms.  She has also felt feverish and chills.  She does have a gynecologist, has a pessary and thinks this may be related.  Has not seen an urologist but is not opposed to consultation.  She tries to hydrate well.  Limits her urinary irritants.  No current facility-administered medications for this encounter.  Current Outpatient Medications:    amLODipine (NORVASC) 2.5 MG tablet, Take 1 tablet (2.5 mg total) by mouth daily., Disp: 90 tablet, Rfl: 3   Apoaequorin (PREVAGEN) 10 MG CAPS, Take by mouth., Disp: , Rfl:    buPROPion (WELLBUTRIN XL) 150 MG 24 hr tablet, TAKE 1 TABLET (150 MG TOTAL) BY MOUTH DAILY. TAKE IN AM, Disp: 90 tablet, Rfl: 1   Cholecalciferol (VITAMIN D3) 50 MCG (2000 UT) capsule, Take 1 capsule (2,000 Units total) by mouth daily., Disp: 100 capsule, Rfl: 3   diclofenac sodium (VOLTAREN) 1 % GEL, Apply 4 g topically 4 (four) times daily., Disp: 100 Tube, Rfl: 11   enalapril (VASOTEC) 10 MG tablet, Take 1 tablet (10 mg total) by mouth 2 (two) times daily., Disp: 60 tablet, Rfl: 11   fluticasone (FLONASE) 50 MCG/ACT nasal spray, Place 1 spray into both nostrils daily., Disp: 16 g, Rfl: 0   hyoscyamine (LEVBID) 0.375 MG 12 hr tablet, TAKE 1 TABLET BY MOUTH TWICE A DAY Annual appt is due must see provider for future refills, Disp: 60 tablet, Rfl:  0   hyoscyamine (LEVSIN SL) 0.125 MG SL tablet, DISSOLVE 1-2 TABLETS UNDER THE TONGUE EVERY FOUR HOURS AS NEEDED FOR PAIN, Disp: 100 tablet, Rfl: 2   ipratropium (ATROVENT) 0.03 % nasal spray, ipratropium bromide 21 mcg (0.03 %) nasal spray, Disp: , Rfl:    levothyroxine (SYNTHROID) 25 MCG tablet, TAKE ONE TABLET BY MOUTH EVERY DAY BEFORE BREAKFAST, Disp: 90 tablet, Rfl: 3   LORazepam (ATIVAN) 0.5 MG tablet, Take 1 by mouth every night at bedtime as needed for sleep, Disp: 30 tablet, Rfl: 3   Lutein-Zeaxanthin 25-5 MG CAPS, Take by mouth. Takes one daily, Disp: , Rfl:    meclizine (ANTIVERT) 12.5 MG tablet, Take 1-2 tablets (12.5-25 mg total) by mouth 3 (three) times daily as needed for dizziness., Disp: 30 tablet, Rfl: 0   meloxicam (MOBIC) 15 MG tablet, TAKE 1 TABLET BY MOUTH EVERY DAY AFTER SURGERY AS NEEDED, Disp: 90 tablet, Rfl: 0   methocarbamol (ROBAXIN) 500 MG tablet, TAKE 1 TABLET BY MOUTH EVERY 6 HOURS AS NEEDED., Disp: 120 tablet, Rfl: 3   Multiple Vitamins-Minerals (MULTIVITAMIN,TX-MINERALS) tablet, Take 1 tablet by mouth daily.  , Disp: , Rfl:    NON FORMULARY, Vitamin C, Osteo BiFlex, Vitamin B12 - PO daily, Disp: , Rfl:    Omega 3 1000 MG CAPS, Take 2 capsules by mouth daily., Disp: , Rfl:    ondansetron (ZOFRAN-ODT) 4 MG disintegrating tablet, Take 1 tablet (4 mg total) by mouth every  8 (eight) hours as needed for nausea or vomiting., Disp: 5 tablet, Rfl: 0   pantoprazole (PROTONIX) 40 MG tablet, TAKE 1 TABLET BY MOUTH EVERY DAY, Disp: 90 tablet, Rfl: 3   pregabalin (LYRICA) 150 MG capsule, Take 150 mg by mouth 2 (two) times daily., Disp: , Rfl:    SUMAtriptan (IMITREX) 100 MG tablet, Take 1 tablet (100 mg total) by mouth every 2 (two) hours as needed for migraine. May repeat in 2 hours if headache persists or recurs., Disp: 10 tablet, Rfl: 2   traMADol (ULTRAM) 50 MG tablet, Take 1-2 tablets (50-100 mg total) by mouth 3 (three) times daily as needed for severe pain., Disp: 90 tablet,  Rfl: 1   No Known Allergies  Past Medical History:  Diagnosis Date   Anemia, iron deficiency    Anxiety    Blood donor    Family history of coronary artery disease 06/04/2016   GERD (gastroesophageal reflux disease)    Hypertension    IBS (irritable bowel syndrome)    Osteoarthritis      Past Surgical History:  Procedure Laterality Date   APPENDECTOMY     CERVICAL FUSION  2011   C4-5 Dr Alveda Reasons   CHOLECYSTECTOMY     ESOPHAGEAL MANOMETRY N/A 11/13/2013   Procedure: ESOPHAGEAL MANOMETRY (EM);  Surgeon: Hilarie Fredrickson, MD;  Location: [facility];  Service: Endoscopy;  Laterality: N/A;    Family History  Problem Relation Age of Onset   Lymphoma Mother    Dementia Mother    Uterine cancer Mother 13       uterine sarcoma   Hypothyroidism Mother    Parkinsonism Father    Heart disease Brother        CAD   Heart attack Brother    Heart disease Maternal Aunt    Heart attack Maternal Aunt    Colon cancer Neg Hx    Esophageal cancer Neg Hx    Rectal cancer Neg Hx    Stomach cancer Neg Hx     Social History   Tobacco Use   Smoking status: Former   Smokeless tobacco: Never  Building services engineer Use: Never used  Substance Use Topics   Alcohol use: Yes    Comment: occ   Drug use: No    ROS   Objective:   Vitals: BP (!) 161/77 (BP Location: Right Arm)   Pulse 71   Temp 98.1 F (36.7 C) (Oral)   Resp 18   SpO2 98%   Physical Exam Constitutional:      General: She is not in acute distress.    Appearance: Normal appearance. She is well-developed. She is not ill-appearing, toxic-appearing or diaphoretic.  HENT:     Head: Normocephalic and atraumatic.     Nose: Nose normal.     Mouth/Throat:     Mouth: Mucous membranes are moist.  Eyes:     General: No scleral icterus.       Right eye: No discharge.        Left eye: No discharge.     Extraocular Movements: Extraocular movements intact.     Conjunctiva/sclera: Conjunctivae normal.  Cardiovascular:     Rate  and Rhythm: Normal rate.  Pulmonary:     Effort: Pulmonary effort is normal.  Abdominal:     General: Bowel sounds are normal. There is no distension.     Palpations: Abdomen is soft. There is no mass.     Tenderness: There is no abdominal tenderness.  There is no right CVA tenderness, left CVA tenderness, guarding or rebound.  Skin:    General: Skin is warm and dry.  Neurological:     General: No focal deficit present.     Mental Status: She is alert and oriented to person, place, and time.  Psychiatric:        Mood and Affect: Mood normal.        Behavior: Behavior normal.        Thought Content: Thought content normal.        Judgment: Judgment normal.     Results for orders placed or performed during the hospital encounter of 12/06/22 (from the past 24 hour(s))  POCT urinalysis dipstick     Status: Abnormal   Collection Time: 12/06/22  1:10 PM  Result Value Ref Range   Color, UA yellow yellow   Clarity, UA cloudy (A) clear   Glucose, UA negative negative mg/dL   Bilirubin, UA negative negative   Ketones, POC UA negative negative mg/dL   Spec Grav, UA 1.308 6.578 - 1.025   Blood, UA trace-intact (A) negative   pH, UA 5.5 5.0 - 8.0   Protein Ur, POC negative negative mg/dL   Urobilinogen, UA 0.2 0.2 or 1.0 E.U./dL   Nitrite, UA Negative Negative   Leukocytes, UA Moderate (2+) (A) Negative    Assessment and Plan :   PDMP not reviewed this encounter.  1. Dysuria    Creatinine clearance calculated at 1mL/min.   Start ciprofloxacin to cover for acute cystitis, urine culture pending.  Recommended consistent hydration, limiting urinary irritants.  Follow-up with urology.  Counseled patient on potential for adverse effects with medications prescribed/recommended today, ER and return-to-clinic precautions discussed, patient verbalized understanding.    Wallis Bamberg, PA-C 12/06/22 1322

## 2022-12-06 NOTE — Discharge Instructions (Addendum)
Please start ciprofloxacin to address an urinary tract infection. Make sure you hydrate very well with plain water and a quantity of 64 ounces of water a day.  Please limit drinks that are considered urinary irritants such as soda, sweet tea, coffee, energy drinks, alcohol.  These can worsen your urinary and genital symptoms but also be the source of them.  I will let you know about your urine culture results through MyChart to see if we need to prescribe or change your antibiotics based off of those results. Follow up with the urology.

## 2022-12-07 ENCOUNTER — Other Ambulatory Visit: Payer: Self-pay | Admitting: Internal Medicine

## 2022-12-07 DIAGNOSIS — N3090 Cystitis, unspecified without hematuria: Secondary | ICD-10-CM

## 2022-12-08 LAB — URINE CULTURE: Culture: >=100000 — AB

## 2022-12-10 DIAGNOSIS — M5412 Radiculopathy, cervical region: Secondary | ICD-10-CM | POA: Diagnosis not present

## 2022-12-15 ENCOUNTER — Other Ambulatory Visit: Payer: Self-pay | Admitting: Internal Medicine

## 2022-12-18 ENCOUNTER — Encounter: Payer: Self-pay | Admitting: Internal Medicine

## 2022-12-24 DIAGNOSIS — Z4689 Encounter for fitting and adjustment of other specified devices: Secondary | ICD-10-CM | POA: Diagnosis not present

## 2022-12-24 DIAGNOSIS — N8111 Cystocele, midline: Secondary | ICD-10-CM | POA: Diagnosis not present

## 2022-12-30 ENCOUNTER — Other Ambulatory Visit (INDEPENDENT_AMBULATORY_CARE_PROVIDER_SITE_OTHER): Payer: Medicare Other

## 2022-12-30 DIAGNOSIS — N3090 Cystitis, unspecified without hematuria: Secondary | ICD-10-CM | POA: Diagnosis not present

## 2022-12-30 LAB — URINALYSIS, ROUTINE W REFLEX MICROSCOPIC
Bilirubin Urine: NEGATIVE
Hgb urine dipstick: NEGATIVE
Ketones, ur: NEGATIVE
Nitrite: POSITIVE — AB
Specific Gravity, Urine: 1.01 (ref 1.000–1.030)
Total Protein, Urine: NEGATIVE
Urine Glucose: NEGATIVE
Urobilinogen, UA: 0.2 (ref 0.0–1.0)
pH: 7 (ref 5.0–8.0)

## 2022-12-31 ENCOUNTER — Encounter: Payer: Self-pay | Admitting: Internal Medicine

## 2023-01-01 ENCOUNTER — Other Ambulatory Visit: Payer: Self-pay | Admitting: Internal Medicine

## 2023-01-01 DIAGNOSIS — N3000 Acute cystitis without hematuria: Secondary | ICD-10-CM | POA: Insufficient documentation

## 2023-01-01 MED ORDER — NITROFURANTOIN MONOHYD MACRO 100 MG PO CAPS
100.00 mg | ORAL_CAPSULE | Freq: Two times a day (BID) | ORAL | 0 refills | Status: AC
Start: 2023-01-01 — End: 2023-01-06

## 2023-01-01 NOTE — Telephone Encounter (Signed)
Patient is concerned about her urinalysis results. She would like to know if someone will call in antibiotics or something to help. She said she looked at her results and thinks she has a UTI. She said she has chronic UTIs. Best callback is [phone].

## 2023-01-02 LAB — CULTURE, URINE COMPREHENSIVE

## 2023-01-04 ENCOUNTER — Encounter: Payer: Self-pay | Admitting: Internal Medicine

## 2023-01-26 ENCOUNTER — Other Ambulatory Visit: Payer: Self-pay | Admitting: Internal Medicine

## 2023-01-27 ENCOUNTER — Encounter (INDEPENDENT_AMBULATORY_CARE_PROVIDER_SITE_OTHER): Payer: Self-pay

## 2023-02-03 NOTE — Telephone Encounter (Signed)
Called pt no answer also sent pt mychart message to call office for appt.Marland KitchenRaechel Armstrong

## 2023-02-05 ENCOUNTER — Ambulatory Visit (INDEPENDENT_AMBULATORY_CARE_PROVIDER_SITE_OTHER): Payer: Medicare Other | Admitting: Internal Medicine

## 2023-02-05 ENCOUNTER — Encounter: Payer: Self-pay | Admitting: Internal Medicine

## 2023-02-05 VITALS — BP 124/60 | HR 86 | Temp 98.2°F | Ht 64.0 in | Wt 144.0 lb

## 2023-02-05 DIAGNOSIS — R350 Frequency of micturition: Secondary | ICD-10-CM

## 2023-02-05 DIAGNOSIS — N3946 Mixed incontinence: Secondary | ICD-10-CM | POA: Insufficient documentation

## 2023-02-05 DIAGNOSIS — R600 Localized edema: Secondary | ICD-10-CM

## 2023-02-05 NOTE — Assessment & Plan Note (Signed)
Per documentation seems to be going on 10+ years. She has had adequate evaluation for secondary causes. Not significantly different than normal. She will continue using compression stockings and make sure to elevate feet when able. Drink plenty of fluids. DVT evaluation in the last few months. None indicated today.

## 2023-02-05 NOTE — Patient Instructions (Signed)
We will check the urine test today.   Make sure to drink plenty of fluids and prop up the feet when you are able.   Use the compression stockings daily.

## 2023-02-05 NOTE — Progress Notes (Signed)
   Subjective:   Patient ID: Haley Armstrong, female    DOB: May 07, 1946, 77 y.o.   MRN: [id number]  HPI The patient is a 77 YO female coming in for leg swelling. Concerned her recent UTI has not cleared  Review of Systems  Constitutional: Negative.   HENT: Negative.    Eyes: Negative.   Respiratory:  Negative for cough, chest tightness and shortness of breath.   Cardiovascular:  Positive for leg swelling. Negative for chest pain and palpitations.  Gastrointestinal:  Negative for abdominal distention, abdominal pain, constipation, diarrhea, nausea and vomiting.  Genitourinary:  Positive for frequency.  Musculoskeletal: Negative.   Skin: Negative.   Neurological: Negative.   Psychiatric/Behavioral: Negative.      Objective:  Physical Exam Constitutional:      Appearance: She is well-developed.  HENT:     Head: Normocephalic and atraumatic.  Cardiovascular:     Rate and Rhythm: Normal rate and regular rhythm.  Pulmonary:     Effort: Pulmonary effort is normal. No respiratory distress.     Breath sounds: Normal breath sounds. No wheezing or rales.  Abdominal:     General: Bowel sounds are normal. There is no distension.     Palpations: Abdomen is soft.     Tenderness: There is no abdominal tenderness. There is no rebound.  Musculoskeletal:     Cervical back: Normal range of motion.     Right lower leg: Edema present.     Left lower leg: Edema present.  Skin:    General: Skin is warm and dry.  Neurological:     Mental Status: She is alert and oriented to person, place, and time.     Coordination: Coordination normal.     Vitals:   02/05/23 1608  BP: 124/60  Pulse: 86  Temp: 98.2 F (36.8 C)  TempSrc: Oral  SpO2: 98%  Weight: 144 lb (65.3 kg)  Height: 5\' 4"  (1.626 m)    Assessment & Plan:

## 2023-02-05 NOTE — Assessment & Plan Note (Signed)
Checking U/A and culture given recent 3 infections.

## 2023-02-05 NOTE — Addendum Note (Signed)
Addended by: Sumner Boast on: 02/05/2023 04:38 PM   Modules accepted: Orders

## 2023-02-08 ENCOUNTER — Other Ambulatory Visit: Payer: Self-pay | Admitting: Internal Medicine

## 2023-02-08 MED ORDER — NITROFURANTOIN MONOHYD MACRO 100 MG PO CAPS: 100.0000 mg | ORAL_CAPSULE | Freq: Two times a day (BID) | ORAL | 0 refills | Status: DC

## 2023-02-09 ENCOUNTER — Ambulatory Visit: Payer: Medicare Other | Admitting: Internal Medicine

## 2023-02-09 ENCOUNTER — Telehealth: Payer: Self-pay | Admitting: Internal Medicine

## 2023-02-09 NOTE — Telephone Encounter (Signed)
Patient returned Micaiah's call about her recent lab results. She would like a call back at [phone].

## 2023-02-15 ENCOUNTER — Other Ambulatory Visit: Payer: Self-pay | Admitting: Internal Medicine

## 2023-02-15 DIAGNOSIS — R1084 Generalized abdominal pain: Secondary | ICD-10-CM | POA: Diagnosis not present

## 2023-02-15 DIAGNOSIS — N3020 Other chronic cystitis without hematuria: Secondary | ICD-10-CM | POA: Diagnosis not present

## 2023-02-17 ENCOUNTER — Ambulatory Visit: Payer: Medicare Other | Admitting: Internal Medicine

## 2023-02-17 ENCOUNTER — Encounter: Payer: Self-pay | Admitting: Internal Medicine

## 2023-02-17 VITALS — BP 110/70 | HR 75 | Temp 98.6°F | Ht 64.0 in | Wt 147.0 lb

## 2023-02-17 DIAGNOSIS — M542 Cervicalgia: Secondary | ICD-10-CM

## 2023-02-17 DIAGNOSIS — K580 Irritable bowel syndrome with diarrhea: Secondary | ICD-10-CM | POA: Diagnosis not present

## 2023-02-17 DIAGNOSIS — I10 Essential (primary) hypertension: Secondary | ICD-10-CM

## 2023-02-17 DIAGNOSIS — N3281 Overactive bladder: Secondary | ICD-10-CM

## 2023-02-17 DIAGNOSIS — G43109 Migraine with aura, not intractable, without status migrainosus: Secondary | ICD-10-CM

## 2023-02-17 DIAGNOSIS — R7309 Other abnormal glucose: Secondary | ICD-10-CM

## 2023-02-17 DIAGNOSIS — F413 Other mixed anxiety disorders: Secondary | ICD-10-CM | POA: Diagnosis not present

## 2023-02-17 DIAGNOSIS — R600 Localized edema: Secondary | ICD-10-CM

## 2023-02-17 MED ORDER — HYOSCYAMINE SULFATE 0.125 MG SL SUBL: SUBLINGUAL_TABLET | SUBLINGUAL | 2 refills | Status: DC

## 2023-02-17 MED ORDER — METHOCARBAMOL 500 MG PO TABS: 500.0000 mg | ORAL_TABLET | Freq: Four times a day (QID) | ORAL | 3 refills | Status: DC | PRN

## 2023-02-17 MED ORDER — LORAZEPAM 0.5 MG PO TABS: ORAL_TABLET | ORAL | 3 refills | Status: DC

## 2023-02-17 MED ORDER — BUPROPION HCL ER (XL) 150 MG PO TB24: 150.0000 mg | ORAL_TABLET | Freq: Every day | ORAL | 1 refills | Status: DC

## 2023-02-17 MED ORDER — TRAMADOL HCL 50 MG PO TABS: 50.0000 mg | ORAL_TABLET | Freq: Three times a day (TID) | ORAL | 1 refills | Status: DC | PRN

## 2023-02-17 MED ORDER — HYOSCYAMINE SULFATE ER 0.375 MG PO TB12: ORAL_TABLET | ORAL | 0 refills | Status: DC

## 2023-02-17 NOTE — Progress Notes (Signed)
Subjective:  Patient ID: Haley Armstrong, female    DOB: 1946/05/25  Age: 77 y.o. MRN: [id number]  CC: Follow-up (3 MNTH F/U/)   HPI Haley Armstrong presents for UTI/OAB, HTN, IBS C/o mild LE edema, neck pain  Joe - her step father is at a NH ([facility]) 55 yo -- stress   Outpatient Medications Prior to Visit  Medication Sig Dispense Refill   amLODipine (NORVASC) 2.5 MG tablet Take 1 tablet (2.5 mg total) by mouth daily. 90 tablet 3   Apoaequorin (PREVAGEN) 10 MG CAPS Take by mouth.     Cholecalciferol (VITAMIN D3) 50 MCG (2000 UT) capsule Take 1 capsule (2,000 Units total) by mouth daily. 100 capsule 3   diclofenac sodium (VOLTAREN) 1 % GEL Apply 4 g topically 4 (four) times daily. 100 Tube 11   enalapril (VASOTEC) 10 MG tablet Take 1 tablet (10 mg total) by mouth 2 (two) times daily. 60 tablet 11   estradiol (ESTRACE) 0.1 MG/GM vaginal cream Place vaginally.     fluticasone (FLONASE) 50 MCG/ACT nasal spray Place 1 spray into both nostrils daily. 16 g 0   ipratropium (ATROVENT) 0.03 % nasal spray ipratropium bromide 21 mcg (0.03 %) nasal spray     levothyroxine (SYNTHROID) 25 MCG tablet TAKE ONE TABLET BY MOUTH EVERY DAY BEFORE BREAKFAST 90 tablet 3   Lutein-Zeaxanthin 25-5 MG CAPS Take by mouth. Takes one daily     meclizine (ANTIVERT) 12.5 MG tablet Take 1-2 tablets (12.5-25 mg total) by mouth 3 (three) times daily as needed for dizziness. 30 tablet 0   meloxicam (MOBIC) 15 MG tablet TAKE 1 TABLET BY MOUTH EVERY DAY AFTER SURGERY AS NEEDED 90 tablet 0   Multiple Vitamins-Minerals (MULTIVITAMIN,TX-MINERALS) tablet Take 1 tablet by mouth daily.       nitrofurantoin, macrocrystal-monohydrate, (MACROBID) 100 MG capsule Take 1 capsule (100 mg total) by mouth 2 (two) times daily. 14 capsule 0   NON FORMULARY Vitamin C, Osteo BiFlex, Vitamin B12 - PO daily     Omega 3 1000 MG CAPS Take 2 capsules by mouth daily.     ondansetron (ZOFRAN-ODT) 4 MG disintegrating tablet Take 1 tablet (4 mg  total) by mouth every 8 (eight) hours as needed for nausea or vomiting. 5 tablet 0   pantoprazole (PROTONIX) 40 MG tablet TAKE 1 TABLET BY MOUTH EVERY DAY 90 tablet 3   pregabalin (LYRICA) 150 MG capsule Take 150 mg by mouth 2 (two) times daily.     SUMAtriptan (IMITREX) 100 MG tablet Take 1 tablet (100 mg total) by mouth every 2 (two) hours as needed for migraine. May repeat in 2 hours if headache persists or recurs. 10 tablet 2   buPROPion (WELLBUTRIN XL) 150 MG 24 hr tablet TAKE 1 TABLET (150 MG TOTAL) BY MOUTH DAILY. TAKE IN AM 90 tablet 1   enalapril (VASOTEC) 5 MG tablet TAKE 1 TABLET BY MOUTH TWICE A DAY 180 tablet 0   hyoscyamine (LEVBID) 0.375 MG 12 hr tablet TAKE 1 TABLET BY MOUTH 2 TIMES A DAY ANNUAL APPOINTMENT IS DUE MUST SEE PROVIDER FOR FUTURE REFILLS 60 tablet 0   hyoscyamine (LEVSIN SL) 0.125 MG SL tablet DISSOLVE 1-2 TABLETS UNDER THE TONGUE EVERY FOUR HOURS AS NEEDED FOR PAIN 100 tablet 2   LORazepam (ATIVAN) 0.5 MG tablet Take 1 by mouth every night at bedtime as needed for sleep 30 tablet 3   methocarbamol (ROBAXIN) 500 MG tablet TAKE 1 TABLET BY MOUTH EVERY 6 HOURS AS NEEDED.  120 tablet 3   traMADol (ULTRAM) 50 MG tablet Take 1-2 tablets (50-100 mg total) by mouth 3 (three) times daily as needed for severe pain. 90 tablet 1   No facility-administered medications prior to visit.    ROS: Review of Systems  Constitutional:  Negative for activity change, appetite change, chills, fatigue and unexpected weight change.  HENT:  Negative for congestion, mouth sores and sinus pressure.   Eyes:  Negative for visual disturbance.  Respiratory:  Negative for cough and chest tightness.   Gastrointestinal:  Negative for abdominal pain and nausea.  Genitourinary:  Negative for difficulty urinating, frequency and vaginal pain.  Musculoskeletal:  Positive for arthralgias, neck pain and neck stiffness. Negative for back pain and gait problem.  Skin:  Negative for pallor and rash.   Neurological:  Negative for dizziness, tremors, weakness, numbness and headaches.  Psychiatric/Behavioral:  Negative for confusion, sleep disturbance and suicidal ideas. The patient is nervous/anxious.     Objective:  BP 110/70 (BP Location: Right Arm, Patient Position: Sitting, Cuff Size: Normal)   Pulse 75   Temp 98.6 F (37 C) (Oral)   Ht 5\' 4"  (1.626 m)   Wt 147 lb (66.7 kg)   SpO2 95%   BMI 25.23 kg/m   BP Readings from Last 3 Encounters:  02/17/23 110/70  02/05/23 124/60  12/06/22 (!) 161/77    Wt Readings from Last 3 Encounters:  02/17/23 147 lb (66.7 kg)  02/05/23 144 lb (65.3 kg)  11/27/22 140 lb (63.5 kg)    Physical Exam Constitutional:      General: She is not in acute distress.    Appearance: She is well-developed.  HENT:     Head: Normocephalic.     Right Ear: External ear normal.     Left Ear: External ear normal.     Nose: Nose normal.  Eyes:     General:        Right eye: No discharge.        Left eye: No discharge.     Conjunctiva/sclera: Conjunctivae normal.     Pupils: Pupils are equal, round, and reactive to light.  Neck:     Thyroid: No thyromegaly.     Vascular: No JVD.     Trachea: No tracheal deviation.  Cardiovascular:     Rate and Rhythm: Normal rate and regular rhythm.     Heart sounds: Normal heart sounds.  Pulmonary:     Effort: No respiratory distress.     Breath sounds: No stridor. No wheezing.  Abdominal:     General: Bowel sounds are normal. There is no distension.     Palpations: Abdomen is soft. There is no mass.     Tenderness: There is no abdominal tenderness. There is no guarding or rebound.  Musculoskeletal:        General: Tenderness present.     Cervical back: Normal range of motion and neck supple. No rigidity.  Lymphadenopathy:     Cervical: No cervical adenopathy.  Skin:    Findings: No erythema or rash.  Neurological:     Cranial Nerves: No cranial nerve deficit.     Motor: No abnormal muscle tone.      Coordination: Coordination normal.     Deep Tendon Reflexes: Reflexes normal.  Psychiatric:        Behavior: Behavior normal.        Thought Content: Thought content normal.        Judgment: Judgment normal.   Cervical spine  is sensitive with range of motion  Lab Results  Component Value Date   WBC 6.2 11/13/2022   HGB 12.9 11/13/2022   HCT 37.6 11/13/2022   PLT 322.0 11/13/2022   GLUCOSE 84 11/13/2022   CHOL 189 11/13/2022   TRIG 72.0 11/13/2022   HDL 66.60 11/13/2022   LDLCALC 108 (H) 11/13/2022   ALT 20 11/13/2022   AST 28 11/13/2022   NA 137 11/13/2022   K 5.0 11/13/2022   CL 101 11/13/2022   CREATININE 0.92 11/13/2022   BUN 18 11/13/2022   CO2 31 11/13/2022   TSH 3.27 07/29/2021   INR 0.90 08/05/2009   HGBA1C 5.8 05/08/2016    No results found.  Assessment & Plan:   Problem List Items Addressed This Visit     Anxiety disorder - Primary    Joe - her step father is at a NH ([facility]) 73 yo -- stress Using Lorazepam sometimes  Potential benefits of a long term benzodiazepines  use as well as potential risks  and complications were explained to the patient and were aknowledged.      Relevant Medications   buPROPion (WELLBUTRIN XL) 150 MG 24 hr tablet   LORazepam (ATIVAN) 0.5 MG tablet   HTN (hypertension)    On Enalapril to 10 mg bid, Norvasc      Irritable bowel syndrome    On Levsin, Levbid      Relevant Medications   hyoscyamine (LEVBID) 0.375 MG 12 hr tablet   hyoscyamine (LEVSIN SL) 0.125 MG SL tablet   Cervicalgia    Worse Blue-Emu cream was recommended to use 2-3 times a day Robaxin prn       Other abnormal glucose    Monitor CBG      Edema    Minimal Use compression socks      Migraines    Better Using Sumatriptan and a muscle relaxant - rare      Relevant Medications   buPROPion (WELLBUTRIN XL) 150 MG 24 hr tablet   methocarbamol (ROBAXIN) 500 MG tablet   traMADol (ULTRAM) 50 MG tablet   OAB (overactive bladder)    Chronic -  Dr Linus Galas for UTI AZO prn taking BID Renal CT is pending         Meds ordered this encounter  Medications   buPROPion (WELLBUTRIN XL) 150 MG 24 hr tablet    Sig: Take 1 tablet (150 mg total) by mouth daily. Take in am    Dispense:  90 tablet    Refill:  1   hyoscyamine (LEVBID) 0.375 MG 12 hr tablet    Sig: TAKE 1 TABLET BY MOUTH 2 TIMES A DAY    Dispense:  60 tablet    Refill:  0   hyoscyamine (LEVSIN SL) 0.125 MG SL tablet    Sig: DISSOLVE 1-2 TABLETS UNDER THE TONGUE EVERY FOUR HOURS AS NEEDED FOR PAIN    Dispense:  100 tablet    Refill:  2   LORazepam (ATIVAN) 0.5 MG tablet    Sig: Take 1 by mouth every night at bedtime as needed for sleep    Dispense:  30 tablet    Refill:  3   methocarbamol (ROBAXIN) 500 MG tablet    Sig: Take 1 tablet (500 mg total) by mouth every 6 (six) hours as needed.    Dispense:  120 tablet    Refill:  3   traMADol (ULTRAM) 50 MG tablet    Sig: Take 1-2 tablets (50-100 mg total)  by mouth 3 (three) times daily as needed for severe pain.    Dispense:  90 tablet    Refill:  1      Follow-up: Return in about 3 months (around 05/20/2023) for a follow-up visit.  Sonda Primes, MD

## 2023-02-17 NOTE — Assessment & Plan Note (Signed)
Monitor CBG

## 2023-02-17 NOTE — Assessment & Plan Note (Signed)
Minimal Use compression socks

## 2023-02-17 NOTE — Assessment & Plan Note (Signed)
>>  ASSESSMENT AND PLAN FOR OAB (OVERACTIVE BLADDER) WRITTEN ON 02/17/2023 10:57 AM BY [name], Georgina Quint, MD  Chronic - Dr Linus Galas for UTI AZO prn taking BID Renal CT is pending

## 2023-02-17 NOTE — Assessment & Plan Note (Signed)
On Levsin, Levbid

## 2023-02-17 NOTE — Assessment & Plan Note (Signed)
Joe - her step father is at a NH ([facility]) 76 yo -- stress Using Lorazepam sometimes  Potential benefits of a long term benzodiazepines  use as well as potential risks  and complications were explained to the patient and were aknowledged.

## 2023-02-17 NOTE — Assessment & Plan Note (Signed)
Chronic - Dr Linus Galas for UTI AZO prn taking BID Renal CT is pending

## 2023-02-17 NOTE — Assessment & Plan Note (Signed)
Worse Blue-Emu cream was recommended to use 2-3 times a day Robaxin prn

## 2023-02-17 NOTE — Assessment & Plan Note (Signed)
Better Using Sumatriptan and a muscle relaxant - rare

## 2023-02-17 NOTE — Assessment & Plan Note (Signed)
On Enalapril to 10 mg bid, Norvasc

## 2023-02-23 DIAGNOSIS — R1084 Generalized abdominal pain: Secondary | ICD-10-CM | POA: Diagnosis not present

## 2023-02-23 DIAGNOSIS — Z9049 Acquired absence of other specified parts of digestive tract: Secondary | ICD-10-CM | POA: Diagnosis not present

## 2023-02-23 DIAGNOSIS — N3090 Cystitis, unspecified without hematuria: Secondary | ICD-10-CM | POA: Diagnosis not present

## 2023-03-02 DIAGNOSIS — Z87440 Personal history of urinary (tract) infections: Secondary | ICD-10-CM | POA: Diagnosis not present

## 2023-03-16 ENCOUNTER — Other Ambulatory Visit: Payer: Self-pay | Admitting: Internal Medicine

## 2023-03-28 ENCOUNTER — Encounter: Payer: Self-pay | Admitting: *Deleted

## 2023-03-28 ENCOUNTER — Ambulatory Visit
Admission: EM | Admit: 2023-03-28 | Discharge: 2023-03-28 | Disposition: A | Discharge status: Home / Self Care | Payer: Medicare Other | Attending: Internal Medicine | Admitting: Internal Medicine

## 2023-03-28 ENCOUNTER — Other Ambulatory Visit: Payer: Self-pay

## 2023-03-28 DIAGNOSIS — Z1152 Encounter for screening for COVID-19: Secondary | ICD-10-CM | POA: Diagnosis not present

## 2023-03-28 DIAGNOSIS — J988 Other specified respiratory disorders: Secondary | ICD-10-CM | POA: Insufficient documentation

## 2023-03-28 DIAGNOSIS — B9789 Other viral agents as the cause of diseases classified elsewhere: Secondary | ICD-10-CM | POA: Insufficient documentation

## 2023-03-28 DIAGNOSIS — I10 Essential (primary) hypertension: Secondary | ICD-10-CM | POA: Insufficient documentation

## 2023-03-28 HISTORY — DX: Presence of urogenital implants: Z96.0

## 2023-03-28 MED ORDER — PROMETHAZINE-DM 6.25-15 MG/5ML PO SYRP
2.5000 mL | ORAL_SOLUTION | Freq: Three times a day (TID) | ORAL | 0 refills | Status: AC | PRN
Start: 2023-03-28 — End: ?

## 2023-03-28 MED ORDER — ACETAMINOPHEN 325 MG PO TABS
650.0000 mg | ORAL_TABLET | Freq: Four times a day (QID) | ORAL | 0 refills | Status: AC | PRN
Start: 2023-03-28 — End: ?

## 2023-03-28 MED ORDER — IPRATROPIUM BROMIDE 0.03 % NA SOLN: 2.0000 | Freq: Two times a day (BID) | NASAL | 0 refills | Status: DC

## 2023-03-28 NOTE — ED Triage Notes (Signed)
C/O sinus pressure and rhinorrhea over past "several days", started with "bad sinus HA" yesterday, along with body aches and feeling feverish.

## 2023-03-28 NOTE — ED Provider Notes (Signed)
[name]  Note:  This document was prepared using Conservation officer, historic buildings and may include unintentional dictation errors.  MRN: [id number] DOB: 01-31-1946  Subjective:   Haley Armstrong is a 77 y.o. female presenting for 3 to 4-day history of sinus congestion, sinus pressure, sinus drainage, malaise and fatigue.  Had a bad sinus headache yesterday, body aches as well.  Has felt subjective fever, slight cough.  No chest pain, shortness of breath or wheezing.  No smoking.  Takes Zyrtec daily.  Used Afrin as well.  Would like a COVID test.  Regarding her blood pressure, patient just had it adjusted, is taking double the medication she used to.  No current facility-administered medications for this encounter.  Current Outpatient Medications:    amLODipine (NORVASC) 2.5 MG tablet, Take 1 tablet (2.5 mg total) by mouth daily., Disp: 90 tablet, Rfl: 3   buPROPion (WELLBUTRIN XL) 150 MG 24 hr tablet, Take 1 tablet (150 mg total) by mouth daily. Take in am, Disp: 90 tablet, Rfl: 1   Cholecalciferol (VITAMIN D3) 50 MCG (2000 UT) capsule, Take 1 capsule (2,000 Units total) by mouth daily., Disp: 100 capsule, Rfl: 3   enalapril (VASOTEC) 10 MG tablet, Take 1 tablet (10 mg total) by mouth 2 (two) times daily., Disp: 60 tablet, Rfl: 11   hyoscyamine (LEVBID) 0.375 MG 12 hr tablet, TAKE ONE TABLET BY MOUTH TWICE DAILY **MUST CALL MD FOR APPOINTMENT, Disp: 60 tablet, Rfl: 0   levothyroxine (SYNTHROID) 25 MCG tablet, TAKE ONE TABLET BY MOUTH EVERY DAY BEFORE BREAKFAST, Disp: 90 tablet, Rfl: 3   LORazepam (ATIVAN) 0.5 MG tablet, Take 1 by mouth every night at bedtime as needed for sleep, Disp: 30 tablet, Rfl: 3   Lutein-Zeaxanthin 25-5 MG CAPS, Take by mouth. Takes one daily, Disp: , Rfl:    meloxicam (MOBIC) 15 MG tablet, TAKE 1 TABLET BY MOUTH EVERY DAY AFTER SURGERY AS NEEDED, Disp: 90 tablet, Rfl: 0   methocarbamol (ROBAXIN) 500 MG tablet, Take 1 tablet (500 mg total)  by mouth every 6 (six) hours as needed., Disp: 120 tablet, Rfl: 3   Multiple Vitamins-Minerals (MULTIVITAMIN,TX-MINERALS) tablet, Take 1 tablet by mouth daily.  , Disp: , Rfl:    nitrofurantoin, macrocrystal-monohydrate, (MACROBID) 100 MG capsule, Take 1 capsule (100 mg total) by mouth 2 (two) times daily., Disp: 14 capsule, Rfl: 0   Omega 3 1000 MG CAPS, Take 2 capsules by mouth daily., Disp: , Rfl:    pantoprazole (PROTONIX) 40 MG tablet, TAKE 1 TABLET BY MOUTH EVERY DAY, Disp: 90 tablet, Rfl: 3   pregabalin (LYRICA) 150 MG capsule, Take 150 mg by mouth 2 (two) times daily., Disp: , Rfl:    traMADol (ULTRAM) 50 MG tablet, Take 1-2 tablets (50-100 mg total) by mouth 3 (three) times daily as needed for severe pain., Disp: 90 tablet, Rfl: 1   Apoaequorin (PREVAGEN) 10 MG CAPS, Take by mouth., Disp: , Rfl:    diclofenac sodium (VOLTAREN) 1 % GEL, Apply 4 g topically 4 (four) times daily., Disp: 100 Tube, Rfl: 11   estradiol (ESTRACE) 0.1 MG/GM vaginal cream, Place vaginally., Disp: , Rfl:    fluticasone (FLONASE) 50 MCG/ACT nasal spray, Place 1 spray into both nostrils daily., Disp: 16 g, Rfl: 0   hyoscyamine (LEVSIN SL) 0.125 MG SL tablet, DISSOLVE 1-2 TABLETS UNDER THE TONGUE EVERY FOUR HOURS AS NEEDED FOR PAIN, Disp: 100 tablet, Rfl: 2   ipratropium (ATROVENT) 0.03 % nasal spray, ipratropium bromide  21 mcg (0.03 %) nasal spray, Disp: , Rfl:    meclizine (ANTIVERT) 12.5 MG tablet, Take 1-2 tablets (12.5-25 mg total) by mouth 3 (three) times daily as needed for dizziness., Disp: 30 tablet, Rfl: 0   NON FORMULARY, Vitamin C, Osteo BiFlex, Vitamin B12 - PO daily, Disp: , Rfl:    ondansetron (ZOFRAN-ODT) 4 MG disintegrating tablet, Take 1 tablet (4 mg total) by mouth every 8 (eight) hours as needed for nausea or vomiting., Disp: 5 tablet, Rfl: 0   SUMAtriptan (IMITREX) 100 MG tablet, Take 1 tablet (100 mg total) by mouth every 2 (two) hours as needed for migraine. May repeat in 2 hours if headache  persists or recurs., Disp: 10 tablet, Rfl: 2   No Known Allergies  Past Medical History:  Diagnosis Date   Anemia, iron deficiency    Anxiety    Blood donor    Family history of coronary artery disease 06/04/2016   GERD (gastroesophageal reflux disease)    Hypertension    IBS (irritable bowel syndrome)    Osteoarthritis    Presence of pessary      Past Surgical History:  Procedure Laterality Date   APPENDECTOMY     CERVICAL FUSION  2011   C4-5 Dr Alveda Reasons   CHOLECYSTECTOMY     ESOPHAGEAL MANOMETRY N/A 11/13/2013   Procedure: ESOPHAGEAL MANOMETRY (EM);  Surgeon: Hilarie Fredrickson, MD;  Location: [facility];  Service: Endoscopy;  Laterality: N/A;    Family History  Problem Relation Age of Onset   Lymphoma Mother    Dementia Mother    Uterine cancer Mother 27       uterine sarcoma   Hypothyroidism Mother    Parkinsonism Father    Heart disease Brother        CAD   Heart attack Brother    Heart disease Maternal Aunt    Heart attack Maternal Aunt    Colon cancer Neg Hx    Esophageal cancer Neg Hx    Rectal cancer Neg Hx    Stomach cancer Neg Hx     Social History   Tobacco Use   Smoking status: Former   Smokeless tobacco: Never  Advertising account planner   Vaping status: Never Used  Substance Use Topics   Alcohol use: Yes    Comment: occ   Drug use: No    ROS   Objective:   Vitals: BP (!) 186/73   Pulse 72   Temp 98.2 F (36.8 C) (Oral)   Resp 16   SpO2 93%   BP Readings from Last 3 Encounters:  03/28/23 (!) 186/73  02/17/23 110/70  02/05/23 124/60   Blood pressure recheck was 162/77.   Physical Exam Constitutional:      General: She is not in acute distress.    Appearance: Normal appearance. She is well-developed and normal weight. She is not ill-appearing, toxic-appearing or diaphoretic.  HENT:     Head: Normocephalic and atraumatic.     Right Ear: Tympanic membrane, ear canal and external ear normal. No drainage or tenderness. No middle ear effusion. There  is no impacted cerumen. Tympanic membrane is not erythematous or bulging.     Left Ear: Tympanic membrane, ear canal and external ear normal. No drainage or tenderness.  No middle ear effusion. There is no impacted cerumen. Tympanic membrane is not erythematous or bulging.     Nose: Nose normal. No congestion or rhinorrhea.     Mouth/Throat:     Mouth: Mucous membranes are  moist. No oral lesions.     Pharynx: No pharyngeal swelling, oropharyngeal exudate, posterior oropharyngeal erythema or uvula swelling.     Tonsils: No tonsillar exudate or tonsillar abscesses.  Eyes:     General: No scleral icterus.       Right eye: No discharge.        Left eye: No discharge.     Extraocular Movements: Extraocular movements intact.     Right eye: Normal extraocular motion.     Left eye: Normal extraocular motion.     Conjunctiva/sclera: Conjunctivae normal.  Neck:     Vascular: No carotid bruit.  Cardiovascular:     Rate and Rhythm: Normal rate and regular rhythm.     Heart sounds: Normal heart sounds. No murmur heard.    No friction rub. No gallop.  Pulmonary:     Effort: Pulmonary effort is normal. No respiratory distress.     Breath sounds: No stridor. No wheezing, rhonchi or rales.  Chest:     Chest wall: No tenderness.  Musculoskeletal:     Cervical back: Normal range of motion and neck supple.  Lymphadenopathy:     Cervical: No cervical adenopathy.  Skin:    General: Skin is warm and dry.  Neurological:     General: No focal deficit present.     Mental Status: She is alert and oriented to person, place, and time.     Cranial Nerves: No cranial nerve deficit.     Motor: No weakness.     Coordination: Coordination normal.     Gait: Gait normal.  Psychiatric:        Mood and Affect: Mood normal.        Behavior: Behavior normal.     Assessment and Plan :   PDMP not reviewed this encounter.  1. Viral respiratory infection   2. Essential hypertension    Deferred imaging given  clear cardiopulmonary exam, hemodynamically stable vital signs. Will manage for viral illness such as viral URI, viral syndrome, viral rhinitis, COVID-19. Recommended supportive care. Offered scripts for symptomatic relief. Testing is pending. Counseled patient on potential for adverse effects with medications prescribed/recommended today, ER and return-to-clinic precautions discussed, patient verbalized understanding.     Wallis Bamberg, PA-C 03/28/23 1505

## 2023-03-28 NOTE — Discharge Instructions (Addendum)
We will notify you of your test results as they arrive and may take between about 24 hours.  I encourage you to sign up for MyChart if you have not already done so as this can be the easiest way for [place] to communicate results to you online or through a phone app.  Generally, we only contact you if it is a positive test result.  In the meantime, if you develop worsening symptoms including fever, chest pain, shortness of breath despite our current treatment plan then please report to the emergency room as this may be a sign of worsening status from possible viral infection.  Otherwise, we will manage this as a viral syndrome. For sore throat or cough try using a honey-based tea. Use 3 teaspoons of honey with juice squeezed from half lemon. Place shaved pieces of ginger into 1/2-1 cup of water and warm over stove top. Then mix the ingredients and repeat every 4 hours as needed. Please take Tylenol 500mg -650mg  every 6 hours for aches and pains, fevers. Hydrate very well with at least 2 liters of water. Eat light meals such as soups to replenish electrolytes and soft fruits, veggies. Start an antihistamine like Zyrtec (10mg  daily) for postnasal drainage, sinus congestion.  You can take this together with Atrovent nasal spray times a day as needed for the same kind of congestion.  Use the cough medications as needed.

## 2023-03-29 LAB — SARS CORONAVIRUS 2 (TAT 6-24 HRS): SARS Coronavirus 2: NEGATIVE

## 2023-04-07 DIAGNOSIS — Z4689 Encounter for fitting and adjustment of other specified devices: Secondary | ICD-10-CM | POA: Diagnosis not present

## 2023-04-07 DIAGNOSIS — R350 Frequency of micturition: Secondary | ICD-10-CM | POA: Diagnosis not present

## 2023-04-12 ENCOUNTER — Other Ambulatory Visit: Payer: Self-pay | Admitting: Internal Medicine

## 2023-04-23 ENCOUNTER — Other Ambulatory Visit: Payer: Self-pay | Admitting: Internal Medicine

## 2023-05-12 ENCOUNTER — Encounter: Payer: Self-pay | Admitting: Obstetrics

## 2023-05-12 ENCOUNTER — Ambulatory Visit: Payer: Medicare Other | Admitting: Obstetrics

## 2023-05-12 VITALS — BP 168/78 | HR 78 | Ht 63.39 in | Wt 145.8 lb

## 2023-05-12 DIAGNOSIS — K580 Irritable bowel syndrome with diarrhea: Secondary | ICD-10-CM

## 2023-05-12 DIAGNOSIS — Z87440 Personal history of urinary (tract) infections: Secondary | ICD-10-CM | POA: Diagnosis not present

## 2023-05-12 DIAGNOSIS — N814 Uterovaginal prolapse, unspecified: Secondary | ICD-10-CM | POA: Diagnosis not present

## 2023-05-12 DIAGNOSIS — N3946 Mixed incontinence: Secondary | ICD-10-CM

## 2023-05-12 DIAGNOSIS — N3281 Overactive bladder: Secondary | ICD-10-CM

## 2023-05-12 LAB — POCT URINALYSIS DIPSTICK
Bilirubin, UA: NEGATIVE
Blood, UA: NEGATIVE
Glucose, UA: NEGATIVE
Ketones, UA: NEGATIVE
Leukocytes, UA: NEGATIVE
Nitrite, UA: NEGATIVE
Protein, UA: NEGATIVE
Spec Grav, UA: 1.01 (ref 1.010–1.025)
Urobilinogen, UA: 0.2 U/dL
pH, UA: 6 (ref 5.0–8.0)

## 2023-05-12 NOTE — Assessment & Plan Note (Addendum)
For treatment of recurrent urinary tract infections, we discussed management of recurrent UTIs including prophylaxis with a daily low dose antibiotic, transvaginal estrogen therapy, D-mannose, and cranberry supplements.  We discussed the role of diagnostic testing such as cystoscopy and upper tract imaging.   - provided instructions to start vaginal estrogen - continue probiotics - POCT UA negative - managed by Dr. Annabell Howells, continue daily macrodantin. Encouraged to discontinue after 3 months of low dose vaginal estrogen use

## 2023-05-12 NOTE — Patient Instructions (Addendum)
You have a stage 2 (out of 4) prolapse.  We discussed the fact that it is not life threatening but there are several treatment options. For treatment of pelvic organ prolapse, we discussed options for management including expectant management, conservative management, and surgical management, such as Kegels, a pessary, pelvic floor physical therapy, and specific surgical procedures.     We discussed the symptoms of overactive bladder (OAB), which include urinary urgency, urinary frequency, night-time urination, with or without urge incontinence.  We discussed management including behavioral therapy (decreasing bladder irritants by following a bladder diet, urge suppression strategies, timed voids, bladder retraining), physical therapy, medication; and for refractory cases posterior tibial nerve stimulation, sacral neuromodulation, and intravesical botulinum toxin injection.    For treatment of recurrent urinary tract infections, we discussed management of recurrent UTIs including prophylaxis with a daily low dose antibiotic, transvaginal estrogen therapy, D-mannose, and cranberry supplements.  We discussed the role of diagnostic testing such as cystoscopy and upper tract imaging.     Women should try to eat at least 21 to 25 grams of fiber a day, while men should aim for 30 to 38 grams a day. You can add fiber to your diet with food or a fiber supplement such as psyllium (metamucil), benefiber, or fibercon.   Here's a look at how much dietary fiber is found in some common foods. When buying packaged foods, check the Nutrition Facts label for fiber content. It can vary among brands.  Fruits Serving size Total fiber (grams)*  Raspberries 1 cup 8.0  Pear 1 medium 5.5  Apple, with skin 1 medium 4.5  Banana 1 medium 3.0  Orange 1 medium 3.0  Strawberries 1 cup 3.0   Vegetables Serving size Total fiber (grams)*  Green peas, boiled 1 cup 9.0  Broccoli, boiled 1 cup chopped 5.0  Turnip greens, boiled 1  cup 5.0  Brussels sprouts, boiled 1 cup 4.0  Potato, with skin, baked 1 medium 4.0  Sweet corn, boiled 1 cup 3.5  Cauliflower, raw 1 cup chopped 2.0  Carrot, raw 1 medium 1.5   Grains Serving size Total fiber (grams)*  Spaghetti, whole-wheat, cooked 1 cup 6.0  Barley, pearled, cooked 1 cup 6.0  Bran flakes 3/4 cup 5.5  Quinoa, cooked 1 cup 5.0  Oat bran muffin 1 medium 5.0  Oatmeal, instant, cooked 1 cup 5.0  Popcorn, air-popped 3 cups 3.5  Brown rice, cooked 1 cup 3.5  Bread, whole-wheat 1 slice 2.0  Bread, rye 1 slice 2.0   Legumes, nuts and seeds Serving size Total fiber (grams)*  Split peas, boiled 1 cup 16.0  Lentils, boiled 1 cup 15.5  Black beans, boiled 1 cup 15.0  Baked beans, canned 1 cup 10.0  Chia seeds 1 ounce 10.0  Almonds 1 ounce (23 nuts) 3.5  Pistachios 1 ounce (49 nuts) 3.0  Sunflower kernels 1 ounce 3.0  *Rounded to nearest 0.5 gram. Source: [employer] for [employer], [employer] vaginal estrogen therapy nightly for two weeks then 2 times weekly at night for treatment of vaginal atrophy (dryness of the vaginal tissues).  Please let us know if the prescription is too expensive and we can look for alternative options.    For vaginal atrophy (thinning of the vaginal tissue that can cause dryness and burning) and UTI prevention we discussed estrogen replacement in the form of vaginal cream.   Start vaginal estrogen therapy nightly for two weeks then 2 times weekly at night.  This can be placed with your finger or an applicator inside the vagina and around the urethra.  Please let us know if the prescription is too expensive and we can look for alternative options.   Is vaginal estrogen therapy safe for me? Vaginal estrogen preparations act on the vaginal skin, and only a very tiny amount is absorbed into the bloodstream (0.01%).  They work in a similar way to hand or face cream.  There is minimal absorption and they are  therefore perfectly safe. If you have had breast cancer and have persistent troublesome symptoms which aren't settling with vaginal moisturisers and lubricants, local estrogen treatment may be a possibility, but consultation with your oncologist should take place first.

## 2023-05-12 NOTE — Progress Notes (Signed)
New Patient Evaluation and Consultation  Referring Provider: Key, Verita Schneiders, NP PCP: Tresa Garter, MD Date of Service: 05/12/2023  SUBJECTIVE Chief Complaint: New Patient (Initial Visit) Haley Armstrong is a 77 y.o. female here today for urinary frequency and urgency. )  History of Present Illness: Haley Armstrong is a 77 y.o. White or Caucasian female seen in consultation at the request of NP Key for evaluation of pelvic organ prolapse.    Symptoms started when she noted spraying with urination, noted at vaginal opening at the time of void. Per chart review, tried size 2 ring with support pessary in 09/07/22, started size 3 ring with support pessary in 2022 due to prolapse. Started #3 incontinence dish use since 05/2021 Patient reports initial decrease urinary leakage with incontinence dish pessary. Pessary use increased urinary frequency 10x/day with leaked > 10x/day with positional changes with the last pessary check 12/24/22, resolved after difficult pessary removal by Dr. Ellyn Hack 04/07/23 with granulation noted and bleeding controlled with silver nitrate at introitus. Denies starting vaginal estrogen 03/02/23 Rx by Dr. Annabell Howells for rUTI, evaluated on 02/15/23. Recommended daily macrodantin for suppression, CT stone protocol and cystoscopy. Pt reports appt with urology today. Did not bring the pessary  Review of records significant for: History of DVT with LE edema  Urinary Symptoms: Leaks urine with going from sitting to standing, with a full bladder, and with urgency with OAB prior to bulge Leaks 2-3 time(s) per days with urgency and small volume leakage, more bothersome Leakage 2x/day with activity, small volume leakge Pad use:  1-3  pads per day, increased with pessary use Patient is not bothered by UI symptoms when pessary is removed  Day time voids 5, 6-7 times per pt.  Nocturia: 1 times per night to void. Voiding dysfunction:  does not empty bladder well.  Patient does not  use a catheter to empty bladder.  When urinating, patient feels a weak stream Drinks: 36oz water per day, 1-3 cups of 1/2 caffeine coffee (irritate bowel symptoms), tea and diet decaf pepsi occasionally  UTIs: 3 UTI's in the last year, last seen by Dr. Annabell Howells 01/2023 Denies history of blood in urine, kidney or bladder stones, pyelonephritis, bladder cancer, and kidney cancer Recent Urine cultures: 02/05/23 >100K pansensitive E. Coli, UA + Leuk. Rx macrobid for 150mg  BID for 5 days. 12/30/22 1-9K pansensitive E. Coli, UA + Leuk/nit. Rx macrobid 150mg  BID x 5d. Recurrence of bladder spasm and odor in urine. Symptoms resolved and returned. 12/06/22 >100K pansensitive E. Coli, UA + Leuk/heme. Recurrent bladder spasm with odor in urine, symptoms resolved for 1 week with Cipro 11/24/22 >100K pansensitive E. Coli, UA + Leuk/heme. Rx Keflex 500mg  BID x 7d. Sx of bladder spasm, denies dysuria, hematuria, increased urinary frequency/urgency. Symptoms stopped for 4 days  Pelvic Organ Prolapse Symptoms:                  Patient Admits to a feeling of a bulge the vaginal area. It has been present for 2 years.  Patient Admits to seeing a bulge after working, standing, bending, and lifting This bulge is bothersome.  Bowel Symptom: Bowel movements: 1 time(s) per day with IBS Stool consistency: hard Straining: no.  Splinting: no.  Incomplete evacuation: no.  Patient Denies accidental bowel leakage / fecal incontinence Bowel regimen: fiber in diet, uses miralax PRN Last colonoscopy: Results in 2017, declines repeat.  HM Colonoscopy          Discontinued - Colonoscopy  Discontinued  Frequency changed to Never automatically (Topic No Longer Applies)   02/28/2016  Done   Only the first 1 history entries have been loaded, but more history exists.           Sexual Function Sexually active: no.  Sexual orientation: Straight Pain with sex: No  Pelvic Pain Denies pelvic pain  Past Medical History:   Past Medical History:  Diagnosis Date   Anemia, iron deficiency    Anxiety    Blood donor    Family history of coronary artery disease 06/04/2016   GERD (gastroesophageal reflux disease)    Hypertension    IBS (irritable bowel syndrome)    Osteoarthritis    Presence of pessary      Past Surgical History:   Past Surgical History:  Procedure Laterality Date   APPENDECTOMY     CERVICAL FUSION  06/29/2009   C4-5 Dr Alveda Reasons   CHOLECYSTECTOMY     ESOPHAGEAL MANOMETRY N/A 11/13/2013   Procedure: ESOPHAGEAL MANOMETRY (EM);  Surgeon: Hilarie Fredrickson, MD;  Location: [facility];  Service: Endoscopy;  Laterality: N/A;   TUBAL LIGATION       Past OB/GYN History: OB History  Gravida Para Term Preterm AB Living  2 2 2     2   SAB IAB Ectopic Multiple Live Births          2    # Outcome Date GA Lbr Len/2nd Weight Sex Type Anes PTL Lv  2 Term     F Vag-Spont   LIV  1 Term     F Vag-Spont   LIV    Vaginal deliveries: 2, largest infant 7lb 1oz  Forceps/ Vacuum deliveries: 0, Cesarean section: 0 Menopausal: Yes, at age 14, Denies vaginal bleeding since menopause Contraception: tubal ligation with appendectomy  Last pap smear was around 02/07/14 with normal findings per patient.  Any history of abnormal pap smears: no. No results found for: "DIAGPAP", "HPVHIGH", "ADEQPAP"  Medications: Patient has a current medication list which includes the following prescription(s): acetaminophen, amlodipine, prevagen, bupropion, vitamin d3, diclofenac sodium, enalapril, hyoscyamine, hyoscyamine, ipratropium, levothyroxine, lorazepam, lutein-zeaxanthin, meclizine, meloxicam, methocarbamol, multivitamin,tx-minerals, nitrofurantoin (macrocrystal-monohydrate), NON FORMULARY, omega 3, ondansetron, pantoprazole, pregabalin, promethazine-dextromethorphan, sumatriptan, and tramadol.   Allergies: Patient has No Known Allergies.   Social History:  Social History   Tobacco Use   Smoking status: Former    Smokeless tobacco: Never  Advertising account planner   Vaping status: Never Used  Substance Use Topics   Alcohol use: Yes    Comment: occ   Drug use: No    Relationship status: married Patient lives with her husband.   Patient is employed at [employer]. Regular exercise: No History of abuse: No  Family History:   Family History  Problem Relation Age of Onset   Diabetes Mother    Uterine cancer Mother 56       uterine sarcoma   Lymphoma Mother    Dementia Mother    Hypothyroidism Mother    Parkinsonism Father    Diabetes Brother    Heart disease Brother        CAD   Heart attack Brother    Diabetes Paternal Grandmother    Heart disease Maternal Aunt    Heart attack Maternal Aunt    Colon cancer Neg Hx    Esophageal cancer Neg Hx    Rectal cancer Neg Hx    Stomach cancer Neg Hx      Review of Systems: Review of Systems  Constitutional:  Negative for fever, malaise/fatigue and weight loss.       Weight gain  Respiratory:  Negative for cough, shortness of breath and wheezing.   Cardiovascular:  Positive for leg swelling. Negative for chest pain and palpitations.  Gastrointestinal:  Negative for abdominal pain and blood in stool.  Genitourinary:  Positive for urgency. Negative for dysuria, frequency and hematuria.       Bulge, leakage  Skin:  Negative for rash.  Neurological:  Positive for weakness. Negative for dizziness and headaches.  Endo/Heme/Allergies:  Does not bruise/bleed easily.  Psychiatric/Behavioral:  Negative for depression. The patient is not nervous/anxious.      OBJECTIVE Physical Exam: Vitals:   05/12/23 0942  BP: (!) 168/78  Pulse: 78  Weight: 145 lb 12.8 oz (66.1 kg)  Height: 5' 3.39" (1.61 m)    Physical Exam Constitutional:      General: She is not in acute distress.    Appearance: Normal appearance.  Genitourinary:     Bladder and urethral meatus normal.     No lesions in the vagina.     Right Labia: No rash, tenderness, lesions, skin  changes or Bartholin's cyst.    Left Labia: No tenderness, lesions, skin changes, Bartholin's cyst or rash.    No vaginal discharge, erythema, tenderness, bleeding, ulceration or granulation tissue.     Anterior vaginal prolapse present.    Moderate vaginal atrophy present.     Right Adnexa: not tender, not full and no mass present.    Left Adnexa: not tender, not full and no mass present.    No cervical motion tenderness, discharge, friability, lesion, polyp or nabothian cyst.     Uterus is not enlarged, fixed, tender, irregular or prolapsed.     No uterine mass detected.    Urethral meatus caruncle not present.    No urethral prolapse, tenderness, mass, hypermobility, discharge or stress urinary incontinence with cough stress test present.     Bladder is not tender, urgency on palpation not present and masses not present.      Pelvic Floor: Levator muscle strength is 3/5.    Levator ani not tender, obturator internus not tender, no asymmetrical contractions present and no pelvic spasms present.    Symmetrical pelvic sensation, anal wink present and BC reflex present. Cardiovascular:     Rate and Rhythm: Normal rate.  Pulmonary:     Effort: Pulmonary effort is normal. No respiratory distress.  Abdominal:     General: Abdomen is flat. There is no distension.     Palpations: Abdomen is soft. There is no mass.     Tenderness: There is no abdominal tenderness.     Hernia: No hernia is present.    Neurological:     Mental Status: She is alert.  Vitals reviewed. Exam conducted with a chaperone present.     POP-Q:   POP-Q  0                                            Aa   0                                           Ba  -6  C   1                                            Gh  2                                            Pb  7                                            tvl   -2                                            Ap  -2                                             Bp  -6                                              D    Post-Void Residual (PVR) by Bladder Scan: In order to evaluate bladder emptying, we discussed obtaining a postvoid residual and patient agreed to this procedure.  Procedure: The ultrasound unit was placed on the patient's abdomen in the suprapubic region after the patient had voided.    Post Void Residual - 05/12/23 0945       Post Void Residual   Post Void Residual 6 mL              Laboratory Results: Lab Results  Component Value Date   COLORU Yellow 05/12/2023   CLARITYU Clear 05/12/2023   GLUCOSEUR Negative 05/12/2023   BILIRUBINUR Negative 05/12/2023   KETONESU Negative 05/12/2023   SPECGRAV 1.010 05/12/2023   RBCUR Negative 05/12/2023   PHUR 6.0 05/12/2023   PROTEINUR Negative 05/12/2023   UROBILINOGEN 0.2 05/12/2023   LEUKOCYTESUR Negative 05/12/2023    Lab Results  Component Value Date   CREATININE 0.92 11/13/2022   CREATININE 0.83 09/15/2021   CREATININE 0.95 07/29/2021    Lab Results  Component Value Date   HGBA1C 5.8 05/08/2016    Lab Results  Component Value Date   HGB 12.9 11/13/2022     ASSESSMENT AND PLAN Ms. Kloepfer is a 77 y.o. with:  1. Mixed stress and urge urinary incontinence   2. Uterine prolapse   3. History of recurrent UTI (urinary tract infection)   4. Irritable bowel syndrome with diarrhea     Mixed stress and urge urinary incontinence Assessment & Plan: - POCT UA negative, bladder scan 6mL - encouraged to start splinting to ensure bladder emptying - reviewed Kegel exercises and fluid  management. Limit caffeine intake - We discussed the symptoms of overactive bladder (OAB), which include urinary urgency, urinary frequency, nocturia, with or without urge incontinence.  While we do not know the exact etiology of OAB, several  treatment options exist. We discussed management including behavioral therapy (decreasing bladder  irritants, urge suppression strategies, timed voids, bladder retraining), physical therapy, medication; for refractory cases posterior tibial nerve stimulation, sacral neuromodulation, and intravesical botulinum toxin injection.  For anticholinergic medications, we discussed the potential side effects of anticholinergics including dry eyes, dry mouth, constipation, cognitive impairment and urinary retention. For Beta-3 agonist medication, we discussed the potential side effect of elevated blood pressure which is more likely to occur in individuals with uncontrolled hypertension. - discussed possible need for additional treatment after treatment of prolapse - For treatment of stress urinary incontinence,  non-surgical options include expectant management, weight loss, physical therapy, as well as a pessary.  Surgical options include a midurethral sling, Burch urethropexy, and transurethral injection of a bulking agent. - reviewed need for urodynamics, pt scheduled to return 06/03/23  Orders: -     POCT urinalysis dipstick  Uterine prolapse Assessment & Plan: - For treatment of pelvic organ prolapse, we discussed options for management including expectant management, conservative management, and surgical management, such as Kegels, a pessary, pelvic floor physical therapy, and specific surgical procedures. - tried size 3 incontinence dish with reduction of leakage prior to most recent pessary check.  - We discussed two options for prolapse repair:  1) vaginal repair without mesh - Pros - safer, no mesh complications - Cons - not as strong as mesh repair, higher risk of recurrence  2) laparoscopic repair with mesh - Pros - stronger, better long-term success - Cons - risks of mesh implant (erosion into vagina or bladder, adhering to the rectum, pain) - these risks are lower than with a vaginal mesh but still exist  3) Vaginal closure procedure - pros - strong, good long-term success - cons -  unable to have penetrative intercourse - discussed lack of sexual activity and consider obliterative procedure - repeat exam standing to reassess apical prolapse   History of recurrent UTI (urinary tract infection) Assessment & Plan: For treatment of recurrent urinary tract infections, we discussed management of recurrent UTIs including prophylaxis with a daily low dose antibiotic, transvaginal estrogen therapy, D-mannose, and cranberry supplements.  We discussed the role of diagnostic testing such as cystoscopy and upper tract imaging.   - provided instructions to start vaginal estrogen - continue probiotics - POCT UA negative - managed by Dr. Annabell Howells, continue daily macrodantin. Encouraged to discontinue after 3 months of low dose vaginal estrogen use   Irritable bowel syndrome with diarrhea Assessment & Plan: - For constipation, we reviewed the importance of a better bowel regimen.  We also discussed the importance of avoiding chronic straining, as it can exacerbate her pelvic floor symptoms; we discussed treating constipation and straining prior to surgery, as postoperative straining can lead to damage to the repair and recurrence of symptoms. We discussed initiating therapy with increasing fluid intake, fiber supplementation, stool softeners, and laxatives such as miralax.  - instructions to start fiber supplementation to optimize bowel consistency and minimize straining for bowel movements   Time spent: I spent 76 minutes dedicated to the care of this patient on the date of this encounter to include pre-visit review of records, face-to-face time with the patient discussing pelvic organ prolapse, IBS, rUTI, mixed urinary incontinence, and post visit documentation and testing.    Loleta Chance, MD

## 2023-05-12 NOTE — Assessment & Plan Note (Addendum)
-   For treatment of pelvic organ prolapse, we discussed options for management including expectant management, conservative management, and surgical management, such as Kegels, a pessary, pelvic floor physical therapy, and specific surgical procedures. - tried size 3 incontinence dish with reduction of leakage prior to most recent pessary check.  - We discussed two options for prolapse repair:  1) vaginal repair without mesh - Pros - safer, no mesh complications - Cons - not as strong as mesh repair, higher risk of recurrence  2) laparoscopic repair with mesh - Pros - stronger, better long-term success - Cons - risks of mesh implant (erosion into vagina or bladder, adhering to the rectum, pain) - these risks are lower than with a vaginal mesh but still exist  3) Vaginal closure procedure - pros - strong, good long-term success - cons - unable to have penetrative intercourse - discussed lack of sexual activity and consider obliterative procedure - repeat exam standing to reassess apical prolapse

## 2023-05-12 NOTE — Assessment & Plan Note (Signed)
-   For constipation, we reviewed the importance of a better bowel regimen.  We also discussed the importance of avoiding chronic straining, as it can exacerbate her pelvic floor symptoms; we discussed treating constipation and straining prior to surgery, as postoperative straining can lead to damage to the repair and recurrence of symptoms. We discussed initiating therapy with increasing fluid intake, fiber supplementation, stool softeners, and laxatives such as miralax.  - instructions to start fiber supplementation to optimize bowel consistency and minimize straining for bowel movements

## 2023-05-12 NOTE — Assessment & Plan Note (Addendum)
-   POCT UA negative, bladder scan 6mL - encouraged to start splinting to ensure bladder emptying - reviewed Kegel exercises and fluid  management. Limit caffeine intake - We discussed the symptoms of overactive bladder (OAB), which include urinary urgency, urinary frequency, nocturia, with or without urge incontinence.  While we do not know the exact etiology of OAB, several treatment options exist. We discussed management including behavioral therapy (decreasing bladder irritants, urge suppression strategies, timed voids, bladder retraining), physical therapy, medication; for refractory cases posterior tibial nerve stimulation, sacral neuromodulation, and intravesical botulinum toxin injection.  For anticholinergic medications, we discussed the potential side effects of anticholinergics including dry eyes, dry mouth, constipation, cognitive impairment and urinary retention. For Beta-3 agonist medication, we discussed the potential side effect of elevated blood pressure which is more likely to occur in individuals with uncontrolled hypertension. - discussed possible need for additional treatment after treatment of prolapse - For treatment of stress urinary incontinence,  non-surgical options include expectant management, weight loss, physical therapy, as well as a pessary.  Surgical options include a midurethral sling, Burch urethropexy, and transurethral injection of a bulking agent. - reviewed need for urodynamics, pt scheduled to return 06/03/23

## 2023-05-16 ENCOUNTER — Other Ambulatory Visit: Payer: Self-pay | Admitting: Internal Medicine

## 2023-05-17 ENCOUNTER — Encounter: Payer: Self-pay | Admitting: Podiatry

## 2023-05-20 ENCOUNTER — Encounter: Payer: Self-pay | Admitting: Podiatry

## 2023-05-20 ENCOUNTER — Ambulatory Visit: Payer: Medicare Other | Admitting: Podiatry

## 2023-05-20 ENCOUNTER — Ambulatory Visit (INDEPENDENT_AMBULATORY_CARE_PROVIDER_SITE_OTHER): Payer: Medicare Other

## 2023-05-20 DIAGNOSIS — M779 Enthesopathy, unspecified: Secondary | ICD-10-CM | POA: Diagnosis not present

## 2023-05-20 DIAGNOSIS — B351 Tinea unguium: Secondary | ICD-10-CM

## 2023-05-20 DIAGNOSIS — M7751 Other enthesopathy of right foot: Secondary | ICD-10-CM

## 2023-05-20 DIAGNOSIS — M898X9 Other specified disorders of bone, unspecified site: Secondary | ICD-10-CM | POA: Diagnosis not present

## 2023-05-20 DIAGNOSIS — M79675 Pain in left toe(s): Secondary | ICD-10-CM

## 2023-05-20 DIAGNOSIS — M79674 Pain in right toe(s): Secondary | ICD-10-CM

## 2023-05-21 NOTE — Progress Notes (Signed)
Subjective:   Patient ID: Haley Armstrong, female   DOB: 77 y.o.   MRN: [id number]   HPI Patient presents with 2 separate problems with 1 being a lesion on the right fifth toe that is been painful and nailbeds that are thick and hard for her to cut and she is concerned about structural position   ROS      Objective:  Physical Exam  Neurovascular status intact with patient found to have good digital perfusion range of motion and has lesion on the distal medial aspect digit 5 right painful when pressed make shoe gear difficult with rotation of the toe and it has abnormal nailbeds with thickness dystrophic conditions     Assessment:  Excess ptotic lesion with keratotic tissue formation right fifth toe painful and nail disease     Plan:  H&P reviewed both conditions and sharp sterile courtesy debridement accomplished and explained cushioning and that this may require exostectomy educating her on procedure recovery.  I then went ahead debrided all remaining nailbeds and this can be done as needed and would be better done with pedicure.  Several nails may need to be removed in future

## 2023-05-24 ENCOUNTER — Ambulatory Visit (INDEPENDENT_AMBULATORY_CARE_PROVIDER_SITE_OTHER): Payer: Medicare Other | Admitting: Internal Medicine

## 2023-05-24 ENCOUNTER — Encounter: Payer: Self-pay | Admitting: Internal Medicine

## 2023-05-24 VITALS — BP 120/70 | HR 64 | Temp 98.6°F | Ht 63.39 in | Wt 146.0 lb

## 2023-05-24 DIAGNOSIS — R413 Other amnesia: Secondary | ICD-10-CM

## 2023-05-24 DIAGNOSIS — D509 Iron deficiency anemia, unspecified: Secondary | ICD-10-CM

## 2023-05-24 DIAGNOSIS — R7309 Other abnormal glucose: Secondary | ICD-10-CM

## 2023-05-24 DIAGNOSIS — F902 Attention-deficit hyperactivity disorder, combined type: Secondary | ICD-10-CM

## 2023-05-24 DIAGNOSIS — M542 Cervicalgia: Secondary | ICD-10-CM | POA: Diagnosis not present

## 2023-05-24 DIAGNOSIS — F413 Other mixed anxiety disorders: Secondary | ICD-10-CM | POA: Diagnosis not present

## 2023-05-24 DIAGNOSIS — R300 Dysuria: Secondary | ICD-10-CM

## 2023-05-24 LAB — CBC WITH DIFFERENTIAL/PLATELET
Basophils Absolute: 0.1 10*3/uL (ref 0.0–0.1)
Basophils Relative: 1.3 % (ref 0.0–3.0)
Eosinophils Absolute: 0.2 10*3/uL (ref 0.0–0.7)
Eosinophils Relative: 4.2 % (ref 0.0–5.0)
HCT: 38.3 % (ref 36.0–46.0)
Hemoglobin: 12.8 g/dL (ref 12.0–15.0)
Lymphocytes Relative: 27.1 % (ref 12.0–46.0)
Lymphs Abs: 1.4 10*3/uL (ref 0.7–4.0)
MCHC: 33.5 g/dL (ref 30.0–36.0)
MCV: 94.6 fL (ref 78.0–100.0)
Monocytes Absolute: 0.6 10*3/uL (ref 0.1–1.0)
Monocytes Relative: 11.5 % (ref 3.0–12.0)
Neutro Abs: 2.8 10*3/uL (ref 1.4–7.7)
Neutrophils Relative %: 55.9 % (ref 43.0–77.0)
Platelets: 279 10*3/uL (ref 150.0–400.0)
RBC: 4.05 Mil/uL (ref 3.87–5.11)
RDW: 14.4 % (ref 11.5–15.5)
WBC: 5 10*3/uL (ref 4.0–10.5)

## 2023-05-24 LAB — LIPID PANEL
Cholesterol: 166 mg/dL (ref 0–200)
HDL: 55 mg/dL (ref >39.00)
LDL Cholesterol: 91 mg/dL (ref 0–99)
NonHDL: 110.6
Total CHOL/HDL Ratio: 3
Triglycerides: 96 mg/dL (ref 0.0–149.0)
VLDL: 19.2 mg/dL (ref 0.0–40.0)

## 2023-05-24 LAB — COMPREHENSIVE METABOLIC PANEL
ALT: 16 U/L (ref 0–35)
AST: 21 U/L (ref 0–37)
Albumin: 4 g/dL (ref 3.5–5.2)
Alkaline Phosphatase: 60 U/L (ref 39–117)
BUN: 19 mg/dL (ref 6–23)
CO2: 25 meq/L (ref 19–32)
Calcium: 9.1 mg/dL (ref 8.4–10.5)
Chloride: 104 meq/L (ref 96–112)
Creatinine, Ser: 0.91 mg/dL (ref 0.40–1.20)
GFR: 61.29 mL/min (ref >60.00)
Glucose, Bld: 91 mg/dL (ref 70–99)
Potassium: 4.6 meq/L (ref 3.5–5.1)
Sodium: 138 meq/L (ref 135–145)
Total Bilirubin: 0.4 mg/dL (ref 0.2–1.2)
Total Protein: 6.6 g/dL (ref 6.0–8.3)

## 2023-05-24 LAB — URINALYSIS, ROUTINE W REFLEX MICROSCOPIC
Bilirubin Urine: NEGATIVE
Hgb urine dipstick: NEGATIVE
Ketones, ur: NEGATIVE
Nitrite: NEGATIVE
RBC / HPF: NONE SEEN (ref 0–?)
Specific Gravity, Urine: 1.01 (ref 1.000–1.030)
Total Protein, Urine: NEGATIVE
Urine Glucose: NEGATIVE
Urobilinogen, UA: 0.2 (ref 0.0–1.0)
pH: 6 (ref 5.0–8.0)

## 2023-05-24 LAB — TSH: TSH: 5.33 u[IU]/mL (ref 0.35–5.50)

## 2023-05-24 LAB — HEMOGLOBIN A1C: Hgb A1c MFr Bld: 5.8 % (ref 4.6–6.5)

## 2023-05-24 NOTE — Progress Notes (Signed)
Subjective:  Patient ID: Haley Armstrong, female    DOB: 1945/10/21  Age: 77 y.o. MRN: [id number]  CC: Medical Management of Chronic Issues (3 mnth f/u, Pt states she is now having swelling in both legs. Discuss rx for nasal spray.)   HPI Haley Armstrong presents for fatigue - working too much C/o memory loss F/u on HTN, neck pain  On Macrobid daily  per Urology   Outpatient Medications Prior to Visit  Medication Sig Dispense Refill   acetaminophen (TYLENOL) 325 MG tablet Take 2 tablets (650 mg total) by mouth every 6 (six) hours as needed for moderate pain. 30 tablet 0   amLODipine (NORVASC) 2.5 MG tablet Take 1 tablet (2.5 mg total) by mouth daily. 90 tablet 3   Apoaequorin (PREVAGEN) 10 MG CAPS Take by mouth.     buPROPion (WELLBUTRIN XL) 150 MG 24 hr tablet Take 1 tablet (150 mg total) by mouth daily. Take in am 90 tablet 1   Cholecalciferol (VITAMIN D3) 50 MCG (2000 UT) capsule Take 1 capsule (2,000 Units total) by mouth daily. 100 capsule 3   diclofenac sodium (VOLTAREN) 1 % GEL Apply 4 g topically 4 (four) times daily. 100 Tube 11   enalapril (VASOTEC) 10 MG tablet Take 1 tablet (10 mg total) by mouth 2 (two) times daily. 60 tablet 11   hyoscyamine (LEVBID) 0.375 MG 12 hr tablet TAKE ONE TABLET BY MOUTH TWICE DAILY **MUST CALL MD FOR APPOINTMENT 60 tablet 0   hyoscyamine (LEVSIN SL) 0.125 MG SL tablet DISSOLVE 1-2 TABLETS UNDER THE TONGUE EVERY FOUR HOURS AS NEEDED FOR PAIN 100 tablet 2   ipratropium (ATROVENT) 0.03 % nasal spray Place 2 sprays into both nostrils 2 (two) times daily. 30 mL 0   levothyroxine (SYNTHROID) 25 MCG tablet TAKE ONE TABLET BY MOUTH EVERY DAY BEFORE BREAKFAST 90 tablet 3   LORazepam (ATIVAN) 0.5 MG tablet Take 1 by mouth every night at bedtime as needed for sleep 30 tablet 3   Lutein-Zeaxanthin 25-5 MG CAPS Take by mouth. Takes one daily     meclizine (ANTIVERT) 12.5 MG tablet Take 1-2 tablets (12.5-25 mg total) by mouth 3 (three) times daily as needed  for dizziness. 30 tablet 0   meloxicam (MOBIC) 15 MG tablet TAKE 1 TABLET BY MOUTH EVERY DAY AFTER SURGERY AS NEEDED 90 tablet 0   methocarbamol (ROBAXIN) 500 MG tablet Take 1 tablet (500 mg total) by mouth every 6 (six) hours as needed. 120 tablet 3   Multiple Vitamins-Minerals (MULTIVITAMIN,TX-MINERALS) tablet Take 1 tablet by mouth daily.       nitrofurantoin, macrocrystal-monohydrate, (MACROBID) 100 MG capsule Take 1 capsule (100 mg total) by mouth 2 (two) times daily. (Patient taking differently: Take 100 mg by mouth daily.) 14 capsule 0   NON FORMULARY Vitamin C, Osteo BiFlex, Vitamin B12 - PO daily     Omega 3 1000 MG CAPS Take 2 capsules by mouth daily.     ondansetron (ZOFRAN-ODT) 4 MG disintegrating tablet Take 1 tablet (4 mg total) by mouth every 8 (eight) hours as needed for nausea or vomiting. 5 tablet 0   pantoprazole (PROTONIX) 40 MG tablet TAKE 1 TABLET BY MOUTH EVERY DAY 90 tablet 3   pregabalin (LYRICA) 150 MG capsule Take 150 mg by mouth 2 (two) times daily.     promethazine-dextromethorphan (PROMETHAZINE-DM) 6.25-15 MG/5ML syrup Take 2.5 mLs by mouth 3 (three) times daily as needed for cough. 100 mL 0   SUMAtriptan (IMITREX) 100 MG tablet  Take 1 tablet (100 mg total) by mouth every 2 (two) hours as needed for migraine. May repeat in 2 hours if headache persists or recurs. 10 tablet 2   traMADol (ULTRAM) 50 MG tablet Take 1-2 tablets (50-100 mg total) by mouth 3 (three) times daily as needed for severe pain. 90 tablet 1   No facility-administered medications prior to visit.    ROS: Review of Systems  Constitutional:  Positive for fatigue. Negative for activity change, appetite change, chills and unexpected weight change.  HENT:  Negative for congestion, mouth sores and sinus pressure.   Eyes:  Negative for visual disturbance.  Respiratory:  Negative for cough and chest tightness.   Gastrointestinal:  Negative for abdominal pain and nausea.  Genitourinary:  Negative for  difficulty urinating, frequency and vaginal pain.  Musculoskeletal:  Negative for back pain and gait problem.  Skin:  Negative for pallor and rash.  Neurological:  Negative for dizziness, tremors, weakness, numbness and headaches.  Psychiatric/Behavioral:  Positive for decreased concentration. Negative for behavioral problems, confusion, sleep disturbance and suicidal ideas. The patient is nervous/anxious.    Stiff neck  Objective:  BP 120/70 (BP Location: Right Arm, Patient Position: Sitting, Cuff Size: Normal)   Pulse 64   Temp 98.6 F (37 C) (Oral)   Ht 5' 3.39" (1.61 m)   Wt 146 lb (66.2 kg)   SpO2 97%   BMI 25.55 kg/m   BP Readings from Last 3 Encounters:  05/24/23 120/70  05/12/23 (!) 168/78  03/28/23 (!) 186/73    Wt Readings from Last 3 Encounters:  05/24/23 146 lb (66.2 kg)  05/12/23 145 lb 12.8 oz (66.1 kg)  02/17/23 147 lb (66.7 kg)    Physical Exam Constitutional:      General: She is not in acute distress.    Appearance: She is well-developed.  HENT:     Head: Normocephalic.     Right Ear: External ear normal.     Left Ear: External ear normal.     Nose: Nose normal.  Eyes:     General:        Right eye: No discharge.        Left eye: No discharge.     Conjunctiva/sclera: Conjunctivae normal.     Pupils: Pupils are equal, round, and reactive to light.  Neck:     Thyroid: No thyromegaly.     Vascular: No JVD.     Trachea: No tracheal deviation.  Cardiovascular:     Rate and Rhythm: Normal rate and regular rhythm.     Heart sounds: Normal heart sounds.  Pulmonary:     Effort: No respiratory distress.     Breath sounds: No stridor. No wheezing.  Abdominal:     General: Bowel sounds are normal. There is no distension.     Palpations: Abdomen is soft. There is no mass.     Tenderness: There is no abdominal tenderness. There is no guarding or rebound.  Musculoskeletal:        General: No tenderness.     Cervical back: Normal range of motion and  neck supple. No rigidity.  Lymphadenopathy:     Cervical: No cervical adenopathy.  Skin:    Findings: No erythema or rash.  Neurological:     Mental Status: She is oriented to person, place, and time.     Cranial Nerves: No cranial nerve deficit.     Motor: No abnormal muscle tone.     Coordination: Coordination normal.  Gait: Gait normal.     Deep Tendon Reflexes: Reflexes normal.  Psychiatric:        Behavior: Behavior normal.        Thought Content: Thought content normal.        Judgment: Judgment normal.        A total time of 45 minutes was spent preparing to see the patient, reviewing tests, x-rays, operative reports and other medical records.  Also, obtaining history and performing comprehensive physical exam.  Additionally, counseling the patient regarding the above listed issues.   Finally, documenting clinical information in the health records, coordination of care, MMSE, educating the patient re: stress.   Lab Results  Component Value Date   WBC 6.2 11/13/2022   HGB 12.9 11/13/2022   HCT 37.6 11/13/2022   PLT 322.0 11/13/2022   GLUCOSE 84 11/13/2022   CHOL 189 11/13/2022   TRIG 72.0 11/13/2022   HDL 66.60 11/13/2022   LDLCALC 108 (H) 11/13/2022   ALT 20 11/13/2022   AST 28 11/13/2022   NA 137 11/13/2022   K 5.0 11/13/2022   CL 101 11/13/2022   CREATININE 0.92 11/13/2022   BUN 18 11/13/2022   CO2 31 11/13/2022   TSH 3.27 07/29/2021   INR 0.90 08/05/2009   HGBA1C 5.8 05/08/2016    No results found.  Assessment & Plan:   Problem List Items Addressed This Visit     Anxiety disorder    Joe - her step father is at a NH ([employer]) 2 yo -- stress Using Lorazepam sometimes  Potential benefits of a long term benzodiazepines  use as well as potential risks  and complications were explained to the patient and were aknowledged.      Cervicalgia     Blue-Emu cream was recommended to use 2-3 times a day        Memory problem - Primary     MMSE 30/30;  clock 10/10 Stress related Fatigue - working too much      Relevant Orders   CBC with Differential/Platelet   Comprehensive metabolic panel   TSH   Urinalysis   Lipid panel   Hemoglobin A1c   Other abnormal glucose    Monitor CBG      Relevant Orders   CBC with Differential/Platelet   Comprehensive metabolic panel   TSH   Urinalysis   Lipid panel   Hemoglobin A1c   Iron deficiency anemia    Check CBC      Relevant Orders   CBC with Differential/Platelet   Comprehensive metabolic panel   TSH   Urinalysis   Lipid panel   ADD (attention deficit disorder)    Fatigue - working too much      Dysuria    On Macrobid daily  per Urology F/u w/Uro-GYN         No orders of the defined types were placed in this encounter.     Follow-up: Return in about 3 months (around 08/24/2023) for a follow-up visit.  Sonda Primes, MD

## 2023-05-24 NOTE — Assessment & Plan Note (Signed)
Blue-Emu cream was recommended to use 2-3 times a day ? ?

## 2023-05-24 NOTE — Assessment & Plan Note (Signed)
On Macrobid daily  per Urology F/u w/Uro-GYN

## 2023-05-24 NOTE — Assessment & Plan Note (Signed)
Monitor CBG

## 2023-05-24 NOTE — Assessment & Plan Note (Signed)
MMSE 30/30; clock 10/10 Stress related Fatigue - working too much

## 2023-05-24 NOTE — Assessment & Plan Note (Signed)
Check CBC 

## 2023-05-24 NOTE — Assessment & Plan Note (Signed)
Fatigue - working too much

## 2023-05-24 NOTE — Assessment & Plan Note (Signed)
Joe - her step father is at a NH ([facility]) 76 yo -- stress Using Lorazepam sometimes  Potential benefits of a long term benzodiazepines  use as well as potential risks  and complications were explained to the patient and were aknowledged.

## 2023-06-02 ENCOUNTER — Encounter: Payer: Self-pay | Admitting: Obstetrics and Gynecology

## 2023-06-02 ENCOUNTER — Ambulatory Visit (INDEPENDENT_AMBULATORY_CARE_PROVIDER_SITE_OTHER): Payer: Medicare Other | Admitting: Obstetrics and Gynecology

## 2023-06-02 VITALS — BP 172/71 | HR 72

## 2023-06-02 DIAGNOSIS — N393 Stress incontinence (female) (male): Secondary | ICD-10-CM | POA: Diagnosis not present

## 2023-06-02 DIAGNOSIS — N814 Uterovaginal prolapse, unspecified: Secondary | ICD-10-CM

## 2023-06-02 DIAGNOSIS — N3281 Overactive bladder: Secondary | ICD-10-CM

## 2023-06-02 DIAGNOSIS — R350 Frequency of micturition: Secondary | ICD-10-CM

## 2023-06-02 DIAGNOSIS — N3946 Mixed incontinence: Secondary | ICD-10-CM

## 2023-06-02 LAB — POCT URINALYSIS DIPSTICK
Bilirubin, UA: NEGATIVE
Blood, UA: NEGATIVE
Glucose, UA: NEGATIVE
Ketones, UA: NEGATIVE
Leukocytes, UA: NEGATIVE
Nitrite, UA: NEGATIVE
Protein, UA: NEGATIVE
Spec Grav, UA: 1.01 (ref 1.010–1.025)
Urobilinogen, UA: 0.2 U/dL
pH, UA: 6 (ref 5.0–8.0)

## 2023-06-02 NOTE — Progress Notes (Signed)
[facility] Urodynamics Procedure  Referring Physician: Tresa Garter, MD Date of Procedure: 06/02/2023  Haley Armstrong is a 77 y.o. female who presents for urodynamic evaluation. Indication(s) for study: mixed incontinence  Vital Signs: BP (!) 172/71   Pulse 72   Laboratory Results: A catheterized urine specimen revealed:  Lab Results  Component Value Date   COLORU yellow 06/02/2023   CLARITYU clear 06/02/2023   GLUCOSEUR Negative 06/02/2023   BILIRUBINUR negative 06/02/2023   KETONESU negative 06/02/2023   SPECGRAV 1.010 06/02/2023   RBCUR negative 06/02/2023   PHUR 6.0 06/02/2023   PROTEINUR Negative 06/02/2023   UROBILINOGEN 0.2 06/02/2023   LEUKOCYTESUR Negative 06/02/2023      Voiding Diary: Not Done  Procedure Timeout:  The correct patient was verified and the correct procedure was verified. The patient was in the correct position and safety precautions were reviewed based on at the patient's history.  Urodynamic Procedure A 58F dual lumen urodynamics catheter was placed under sterile conditions into the patient's bladder. A 58F catheter was placed into the rectum in order to measure abdominal pressure. EMG patches were placed in the appropriate position.  All connections were confirmed and calibrations/adjusted made. Saline was instilled into the bladder through the dual lumen catheters.  Cough/valsalva pressures were measured periodically during filling.  Patient was allowed to void.  The bladder was then emptied of its residual.  UROFLOW: Revealed a Qmax of 10 mL/sec.  She voided 146 mL and had a residual of 75 mL.  It was a interrupted pattern and represented normal habits.   CMG: This was performed with sterile water in the sitting position at a fill rate of 20-30 mL/min.    First sensation of fullness was 67 mLs,  First urge was 85 mLs,  Strong urge was 123 mLs and  Capacity was 468 mLs  Stress incontinence was demonstrated Highest  negative Barrier CLPP was 108 cmH20 at 126 ml. Highest positive Barrier VLPP was 33 cmH20 at 391 ml.  Detrusor function was normal, with phasic contractions seen when standing.  The first occurred at 390 mL to 6 cm of water and was associated with urge.  Compliance:  Normal. End fill detrusor pressure was 0 cmH20.    UPP: MUCP with barrier reduction was 56 cm of water.    MICTURITION STUDY: Voiding was performed with reduction using scopettes in the sitting position.  Pdet at Qmax was 7 cm of water.  Qmax was 19.5 mL/sec.  It was a normal pattern.  She voided 394 mL and had a residual of 75 mL.  It was a volitional void, sustained detrusor contraction was present and abdominal straining was not present  EMG: This was performed with patches.  She had voluntary contractions, recruitment with fill was present and urethral sphincter was relaxed with void.  The details of the procedure with the study tracings have been scanned into EPIC.   Urodynamic Impression:  1. Sensation was normal; capacity was normal 2. Stress Incontinence was demonstrated at normal pressures only when standing; 3. Detrusor Overactivity was demonstrated without leakage with standing. 4. Emptying was normal with a normal PVR, a sustained detrusor contraction present,  abdominal straining not present, normal urethral sphincter activity on EMG.  Plan: - The patient will follow up  to discuss the findings and treatment options.

## 2023-06-07 ENCOUNTER — Encounter: Payer: Self-pay | Admitting: Podiatry

## 2023-06-07 ENCOUNTER — Other Ambulatory Visit: Payer: Self-pay | Admitting: Internal Medicine

## 2023-06-07 ENCOUNTER — Ambulatory Visit: Payer: Medicare Other | Admitting: Podiatry

## 2023-06-07 VITALS — Ht 63.5 in | Wt 146.0 lb

## 2023-06-07 DIAGNOSIS — M898X9 Other specified disorders of bone, unspecified site: Secondary | ICD-10-CM | POA: Diagnosis not present

## 2023-06-07 NOTE — Progress Notes (Signed)
Subjective:   Patient ID: Haley Armstrong, female   DOB: 77 y.o.   MRN: [id number]   HPI Patient presents stating she has a lot of pain in that right fifth toe and it has been so sore and it did not respond well to the treatment   ROS      Objective:  Physical Exam  Neuro vascular status intact with keratotic excess ptotic lesion digit 5 right on the inside of the toe painful when pressed making shoe gear very difficult     Assessment:  Chronic lesion fifth digit right not responding so far     Plan:  H&P reviewed I do think that exostectomy will be necessary for this and I reviewed with her exostectomy and allowed her to go over consent form understanding risk alternative treatments that are listed.  Patient signed consent form were trying to make her a tentative date she is still working so may try to work through the holidays and rest the pain gets too bad.  I applied cushioning around the area with dressing and we will see if this helps her temporarily until we can do the surgery

## 2023-06-09 ENCOUNTER — Other Ambulatory Visit (HOSPITAL_COMMUNITY)
Admission: RE | Admit: 2023-06-09 | Discharge: 2023-06-09 | Disposition: A | Discharge status: Home / Self Care | Payer: Medicare Other | Source: Ambulatory Visit | Attending: Obstetrics | Admitting: Obstetrics

## 2023-06-09 ENCOUNTER — Ambulatory Visit: Payer: Medicare Other | Admitting: Obstetrics

## 2023-06-09 ENCOUNTER — Encounter: Payer: Self-pay | Admitting: Obstetrics

## 2023-06-09 VITALS — BP 163/71

## 2023-06-09 DIAGNOSIS — N814 Uterovaginal prolapse, unspecified: Secondary | ICD-10-CM | POA: Insufficient documentation

## 2023-06-09 DIAGNOSIS — N3946 Mixed incontinence: Secondary | ICD-10-CM

## 2023-06-09 DIAGNOSIS — Z1151 Encounter for screening for human papillomavirus (HPV): Secondary | ICD-10-CM | POA: Insufficient documentation

## 2023-06-09 DIAGNOSIS — K580 Irritable bowel syndrome with diarrhea: Secondary | ICD-10-CM | POA: Diagnosis not present

## 2023-06-09 DIAGNOSIS — Z87440 Personal history of urinary (tract) infections: Secondary | ICD-10-CM

## 2023-06-09 DIAGNOSIS — Z01411 Encounter for gynecological examination (general) (routine) with abnormal findings: Secondary | ICD-10-CM | POA: Diagnosis present

## 2023-06-09 DIAGNOSIS — R413 Other amnesia: Secondary | ICD-10-CM

## 2023-06-09 NOTE — Assessment & Plan Note (Signed)
-   reviewed risks of cognitive dysfunction with surgery and anesthesia due to age - encouraged pt to bring family member at follow-up appointment to review risk and postop expectations - encouraged pt to include daughter or husband at follow-up

## 2023-06-09 NOTE — Patient Instructions (Addendum)
Please schedule transvaginal ultrasound with radiology  If your findings are abnormal, you will need an endometrial biopsy at your follow-up visit.   We discussed two options for prolapse repair:  1) vaginal repair without mesh - Pros - safer, no mesh complications - Cons - not as strong as mesh repair, higher risk of recurrence  2) Vaginal closure procedure - pros - strong, good long-term success - cons - unable to have penetrative intercourse  Continue vaginal estrogen 1g twice a week and macrobid daily for history of recurrent UTI.  Start citrucel daily for bowel movements  If you are able, please bring a family member to your follow-up appointment.

## 2023-06-09 NOTE — Addendum Note (Signed)
Addended by: Thressa Sheller on: 06/09/2023 02:05 PM   Modules accepted: Orders

## 2023-06-09 NOTE — Assessment & Plan Note (Addendum)
-   POCT UA negative 05/12/23, bladder scan 6mL - encouraged to start splinting to ensure bladder emptying - reviewed Kegel exercises and fluid  management. Limit caffeine intake - We discussed the symptoms of overactive bladder (OAB), which include urinary urgency, urinary frequency, nocturia, with or without urge incontinence.  While we do not know the exact etiology of OAB, several treatment options exist. We discussed management including behavioral therapy (decreasing bladder irritants, urge suppression strategies, timed voids, bladder retraining), physical therapy, medication; for refractory cases posterior tibial nerve stimulation, sacral neuromodulation, and intravesical botulinum toxin injection.  For anticholinergic medications, we discussed the potential side effects of anticholinergics including dry eyes, dry mouth, constipation, cognitive impairment and urinary retention. Will not use due to age  For Beta-3 agonist medication, we discussed the potential side effect of elevated blood pressure which is more likely to occur in individuals with uncontrolled hypertension. - discussed possible need for additional treatment after treatment of prolapse - For treatment of stress urinary incontinence,  non-surgical options include expectant management, weight loss, physical therapy, as well as a pessary.  Surgical options include a midurethral sling, Burch urethropexy, and transurethral injection of a bulking agent. - urodynamics 06/03/23 with SUI and OAB noted - reviewed treatment options for SUI: 1) Sling: The effectiveness of a midurethral vaginal mesh sling is approximately 85%, and thus, there will be times when you may leak urine after surgery, especially if your bladder is full or if you have a strong cough. There is a balance between making the sling tight enough to treat your leakage but not too tight so that you have long-term difficulty emptying your bladder. A mesh sling will not directly  treat overactive bladder/urge incontinence and may worsen it.  There is an FDA safety notification on vaginal mesh procedures for prolapse but NOT mesh slings. We have extensive experience and training with mesh placement and we have close postoperative follow up to identify any potential complications from mesh. It is important to realize that this mesh is a permanent implant that cannot be easily removed. There are rare risks of mesh exposure (2-4%), pain with intercourse (0-7%), and infection (<1%). The risk of mesh exposure if more likely in a woman with risks for poor healing (prior radiation, poorly controlled diabetes, or immunocompromised). The risk of new or worsened chronic pain after mesh implant is more common in women with baseline chronic pain and/or poorly controlled anxiety or depression. Approximately 2-4% of patients will experience longer-term post-operative voiding dysfunction that may require surgical revision of the sling. We also reviewed that postoperatively, her stream may not be as strong as before surgery.  2. Periurethral bulking (Bulkamid). We discussed success rate of approximately 70-80% and possible need for second injection. We reviewed that this is not a permanent procedure and the Bulkamid does dissolve over time. Risks reviewed including injury to bladder or urethra, UTI, urinary retention and hematuria.  - patient desires to proceed with midurethral sling

## 2023-06-09 NOTE — Assessment & Plan Note (Addendum)
-   For treatment of pelvic organ prolapse, we discussed options for management including expectant management, conservative management, and surgical management, such as Kegels, a pessary, pelvic floor physical therapy, and specific surgical procedures. - tried size 3 incontinence dish with reduction of leakage prior to most recent pessary check. Declines pessary refitting - We discussed two options for prolapse repair:  1) vaginal repair without mesh - Pros - safer, no mesh complications - Cons - not as strong as mesh repair, higher risk of recurrence  2) laparoscopic repair with mesh - Pros - stronger, better long-term success - Cons - risks of mesh implant (erosion into vagina or bladder, adhering to the rectum, pain) - these risks are lower than with a vaginal mesh but still exist  3) Vaginal closure procedure - pros - strong, good long-term success - cons - unable to have penetrative intercourse - discussed lack of sexual activity and consider obliterative procedure  - Unclear if she is ready for obliterative procedure, desires to proceed with vaginal suspension.  Prolapse (with or without mesh): Risk factors for surgical failure  include things that put pressure on your pelvis and the surgical repair, including obesity, chronic cough, and heavy lifting or straining (including lifting children or adults, straining on the toilet, or lifting heavy objects such as furniture or anything weighing >25 lbs. Risks of recurrence is 20-30% with vaginal native tissue repair and a less than 10% with sacrocolpopexy with mesh.   - reviewed risks and benefits of uterine preservation vs. Hysterectomy, pt desires to proceed with uterine preservation to minimize OR time. Reviewed risks of need for additional procedures in the future. Pap smear pending.  Reviewed endometrial assessment with TVUS vs. Endometrial biopsy, pt desires to proceed with TVUS with radiology information provided. Explained possible need  for EMB if abnormal findings. - repeat exam standing to reassess apical prolapse

## 2023-06-09 NOTE — Addendum Note (Signed)
Addended byWyatt Haste T on: 06/09/2023 01:56 PM   Modules accepted: Orders

## 2023-06-09 NOTE — Assessment & Plan Note (Signed)
-   For treatment of recurrent urinary tract infections, we reviewed management of recurrent UTIs including prophylaxis with a daily low dose antibiotic, transvaginal estrogen therapy, D-mannose, and cranberry supplements.  We discussed the role of diagnostic testing such as cystoscopy and upper tract imaging.   - provided instructions to start vaginal estrogen - continue probiotics - POCT UA negative 11/13 and 06/02/23 - managed by Dr. Annabell Howells, continue daily macrodantin until surgery. Encouraged to discontinue after 3 months of low dose vaginal estrogen use

## 2023-06-09 NOTE — Assessment & Plan Note (Signed)
-   For constipation, we reviewed the importance of a better bowel regimen.  We also discussed the importance of avoiding chronic straining, as it can exacerbate her pelvic floor symptoms; we discussed treating constipation and straining prior to surgery, as postoperative straining can lead to damage to the repair and recurrence of symptoms. We discussed initiating therapy with increasing fluid intake, fiber supplementation, stool softeners, and laxatives such as miralax.  - reviewed instructions to start fiber supplementation to optimize bowel consistency and minimize straining for bowel movements prior to surgery

## 2023-06-09 NOTE — Progress Notes (Signed)
[facility] Return Visit  SUBJECTIVE  History of Present Illness: Haley Armstrong is a 77 y.o. female seen in follow-up for stage II pelvic organ prolapse, mixed urinary incontinence, history of recurrent UTI and IBS-D. Plan at last visit was urodynamic testing, size 3 incontinence dish pessary, vaginal estrogen, and fiber supplementation.   Denies pessary use today Reports decreased urgency leakage since she started bladder training with intentional voids during her lunch break, continues to experience 2-3x/day, SUI 1-2x/day and uses 1 pad/day  Continues vaginal estrogen twice a week and daily macrobid for history of rUTI managed by Haley Armstrong Reports episodic use of Citrucel for constipation  Urodynamic Impression 06/02/23:  1. Sensation was normal; capacity was normal 2. Stress Incontinence was demonstrated at normal pressures only when standing; 3. Detrusor Overactivity was demonstrated without leakage with standing. 4. Emptying was normal with a normal PVR, a sustained detrusor contraction present,  abdominal straining not present, normal urethral sphincter activity on EMG.  Past Medical History: Patient  has a past medical history of Anemia, iron deficiency, Anxiety, Blood donor, Family history of coronary artery disease (06/04/2016), GERD (gastroesophageal reflux disease), Hypertension, IBS (irritable bowel syndrome), Osteoarthritis, and Presence of pessary.   Past Surgical History: She  has a past surgical history that includes Appendectomy; Cholecystectomy; Cervical fusion (06/29/2009); Esophageal manometry (N/A, 11/13/2013); and Tubal ligation.   Medications: She has a current medication list which includes the following prescription(s): acetaminophen, amlodipine, prevagen, bupropion, vitamin d3, diclofenac sodium, enalapril, hyoscyamine, hyoscyamine, ipratropium, levothyroxine, lorazepam, lutein-zeaxanthin, meclizine, meloxicam, methocarbamol,  multivitamin,tx-minerals, nitrofurantoin (macrocrystal-monohydrate), NON FORMULARY, omega 3, ondansetron, pantoprazole, pregabalin, promethazine-dextromethorphan, sumatriptan, and tramadol.   Allergies: Patient has No Known Allergies.   Social History: Patient  reports that she has quit smoking. She has never used smokeless tobacco. She reports current alcohol use. She reports that she does not use drugs.      OBJECTIVE     Physical Exam: Vitals:   06/09/23 1034  BP: (!) 163/71   Gen: No apparent distress, A&O x 3.  Detailed Urogynecologic Evaluation:  Physical Exam Constitutional:      General: She is not in acute distress.    Appearance: Normal appearance.  Genitourinary:     Urethral meatus normal.     No lesions in the vagina.     Right Labia: No rash, tenderness, lesions, skin changes or Bartholin's cyst.    Left Labia: No tenderness, lesions, skin changes, Bartholin's cyst or rash.    No vaginal discharge, erythema, tenderness or ulceration.     Anterior, posterior and apical vaginal prolapse present.    Severe vaginal atrophy present.     Right Adnexa: not tender, not full and no mass present.    Left Adnexa: not tender, not full and no mass present.    No cervical discharge, friability, lesion or polyp.     Uterus is prolapsed.     Uterus is not enlarged, fixed, tender or irregular.     No uterine mass detected. Cardiovascular:     Rate and Rhythm: Normal rate.  Pulmonary:     Effort: Pulmonary effort is normal. No respiratory distress.  Neurological:     Mental Status: She is alert.  Vitals reviewed. Exam conducted with a chaperone present.      ASSESSMENT AND PLAN    Haley Armstrong is a 77 y.o. with:  1. Mixed stress and urge urinary incontinence   2. Uterine prolapse   3. History of recurrent UTI (urinary tract infection)  4. Irritable bowel syndrome with diarrhea   5. Memory problem     Mixed stress and urge urinary incontinence Assessment &  Plan: - POCT UA negative 05/12/23, bladder scan 6mL - encouraged to start splinting to ensure bladder emptying - reviewed Kegel exercises and fluid  management. Limit caffeine intake - We discussed the symptoms of overactive bladder (OAB), which include urinary urgency, urinary frequency, nocturia, with or without urge incontinence.  While we do not know the exact etiology of OAB, several treatment options exist. We discussed management including behavioral therapy (decreasing bladder irritants, urge suppression strategies, timed voids, bladder retraining), physical therapy, medication; for refractory cases posterior tibial nerve stimulation, sacral neuromodulation, and intravesical botulinum toxin injection.  For anticholinergic medications, we discussed the potential side effects of anticholinergics including dry eyes, dry mouth, constipation, cognitive impairment and urinary retention. Will not use due to age  For Beta-3 agonist medication, we discussed the potential side effect of elevated blood pressure which is more likely to occur in individuals with uncontrolled hypertension. - discussed possible need for additional treatment after treatment of prolapse - For treatment of stress urinary incontinence,  non-surgical options include expectant management, weight loss, physical therapy, as well as a pessary.  Surgical options include a midurethral sling, Burch urethropexy, and transurethral injection of a bulking agent. - urodynamics 06/03/23 with SUI and OAB noted - reviewed treatment options for SUI: 1) Sling: The effectiveness of a midurethral vaginal mesh sling is approximately 85%, and thus, there will be times when you may leak urine after surgery, especially if your bladder is full or if you have a strong cough. There is a balance between making the sling tight enough to treat your leakage but not too tight so that you have long-term difficulty emptying your bladder. A mesh sling will not  directly treat overactive bladder/urge incontinence and may worsen it.  There is an FDA safety notification on vaginal mesh procedures for prolapse but NOT mesh slings. We have extensive experience and training with mesh placement and we have close postoperative follow up to identify any potential complications from mesh. It is important to realize that this mesh is a permanent implant that cannot be easily removed. There are rare risks of mesh exposure (2-4%), pain with intercourse (0-7%), and infection (<1%). The risk of mesh exposure if more likely in a woman with risks for poor healing (prior radiation, poorly controlled diabetes, or immunocompromised). The risk of new or worsened chronic pain after mesh implant is more common in women with baseline chronic pain and/or poorly controlled anxiety or depression. Approximately 2-4% of patients will experience longer-term post-operative voiding dysfunction that may require surgical revision of the sling. We also reviewed that postoperatively, her stream may not be as strong as before surgery.  2. Periurethral bulking (Bulkamid). We discussed success rate of approximately 70-80% and possible need for second injection. We reviewed that this is not a permanent procedure and the Bulkamid does dissolve over time. Risks reviewed including injury to bladder or urethra, UTI, urinary retention and hematuria.  - patient desires to proceed with midurethral sling    Uterine prolapse Assessment & Plan: - For treatment of pelvic organ prolapse, we discussed options for management including expectant management, conservative management, and surgical management, such as Kegels, a pessary, pelvic floor physical therapy, and specific surgical procedures. - tried size 3 incontinence dish with reduction of leakage prior to most recent pessary check.  - We discussed two options for prolapse repair:  1) vaginal  repair without mesh - Pros - safer, no mesh complications - Cons  - not as strong as mesh repair, higher risk of recurrence  2) laparoscopic repair with mesh - Pros - stronger, better long-term success - Cons - risks of mesh implant (erosion into vagina or bladder, adhering to the rectum, pain) - these risks are lower than with a vaginal mesh but still exist  3) Vaginal closure procedure - pros - strong, good long-term success - cons - unable to have penetrative intercourse - discussed lack of sexual activity and consider obliterative procedure  - reviewed risks and benefits of uterine preservation vs. Hysterectomy, pt desires to proceed with uterine preservation to minimize OR time. Reviewed risks of need for additional procedures in the future. Pap smear pending.  Reviewed endometrial assessment with TVUS vs. Endometrial biopsy, pt desires to proceed with TVUS with radiology information provided. Explained possible need for EMB if abnormal findings. - repeat exam standing to reassess apical prolapse  Orders: -     US PELVIS TRANSVAGINAL NON-OB (TV ONLY); Future  History of recurrent UTI (urinary tract infection) Assessment & Plan: - For treatment of recurrent urinary tract infections, we reviewed management of recurrent UTIs including prophylaxis with a daily low dose antibiotic, transvaginal estrogen therapy, D-mannose, and cranberry supplements.  We discussed the role of diagnostic testing such as cystoscopy and upper tract imaging.   - provided instructions to start vaginal estrogen - continue probiotics - POCT UA negative 11/13 and 06/02/23 - managed by Haley Armstrong, continue daily macrodantin until surgery. Encouraged to discontinue after 3 months of low dose vaginal estrogen use   Irritable bowel syndrome with diarrhea Assessment & Plan: - For constipation, we reviewed the importance of a better bowel regimen.  We also discussed the importance of avoiding chronic straining, as it can exacerbate her pelvic floor symptoms; we discussed treating  constipation and straining prior to surgery, as postoperative straining can lead to damage to the repair and recurrence of symptoms. We discussed initiating therapy with increasing fluid intake, fiber supplementation, stool softeners, and laxatives such as miralax.  - reviewed instructions to start fiber supplementation to optimize bowel consistency and minimize straining for bowel movements prior to surgery   Memory problem Assessment & Plan: - reviewed risks of cognitive dysfunction with surgery and anesthesia due to age - encouraged pt to bring family member at follow-up appointment to review risk and postop expectations - encouraged pt to include daughter or husband at follow-up    Plan for surgery: Exam under anesthesia, possible sacrospinous ligament suspension, anterior/posterior repair, perineorrhaphy, midurethral sling, cytosurethroscopy.  - We reviewed the patient's specific anatomic and functional findings, with the assistance of diagrams, and together finalized the above procedure. The planned surgical procedures were discussed along with the surgical risks outlined below, which were also provided on a detailed handout. Additional treatment options including expectant management, conservative management, medical management were discussed where appropriate.  We reviewed the benefits and risks of each treatment option.   General Surgical Risks: For all procedures, there are risks of bleeding, infection, damage to surrounding organs including but not limited to bowel, bladder, blood vessels, ureters and nerves, and need for further surgery if an injury were to occur. These risks are all low with minimally invasive surgery.   There are risks of numbness and weakness at any body site or buttock/rectal pain.  It is possible that baseline pain can be worsened by surgery, either with or without mesh. If surgery is vaginal, there  is also a low risk of possible conversion to laparoscopy or open  abdominal incision where indicated. Very rare risks include blood transfusion, blood clot, heart attack, pneumonia, or death.   There is also a risk of short-term postoperative urinary retention with need to use a catheter. About half of patients need to go home from surgery with a catheter, which is then later removed in the office. The risk of long-term need for a catheter is very low. There is also a risk of worsening of overactive bladder.   - For preop Visit:  She is required to have a visit within 30 days of her surgery.   Today we reviewed pre-operative preparation, peri-operative expectations, and post-operative instructions/recovery.  She was provided with instructional handouts. She understands not to take aspirin (>81mg ) or NSAIDs 7 days prior to surgery. Prescriptions will be provided pending scheduled surgery: Oxycodone 5mg , Tylenol 500mg , Miralax. Avoids NSAIDs due to Meloxicam use. These prescriptions will be sent prior to surgery. Uses ultram, robaxin, and Lyrica at baseline for neck pain  - Medical clearance: required Letter sent to primary care provider requesting risk stratification and medical optimization due to blood pressure, venous stasis rash, and age. - Anticoagulant use: Yes due to history of DVT  - Medicaid Hysterectomy form: No - Accepts blood transfusion: Yes - Expected length of stay: outpatient  Request sent for surgery scheduling.   Time spent: I spent 74 minutes dedicated to the care of this patient on the date of this encounter to include pre-visit review of records, face-to-face time with the patient discussing treatment options for pelvic organ prolapse, mixed urinary incontinence, constipation, recurrent UTI, memory problems, IBS and post visit documentation and ordering medication/ testing outside of pap smear collection.  Loleta Chance, MD

## 2023-06-14 LAB — CYTOLOGY - PAP
Comment: NEGATIVE
Diagnosis: UNDETERMINED — AB
High risk HPV: NEGATIVE

## 2023-06-16 ENCOUNTER — Ambulatory Visit (HOSPITAL_COMMUNITY)
Admission: RE | Admit: 2023-06-16 | Discharge: 2023-06-16 | Disposition: A | Discharge status: Home / Self Care | Payer: Medicare Other | Source: Ambulatory Visit | Attending: Obstetrics | Admitting: Obstetrics

## 2023-06-16 DIAGNOSIS — N814 Uterovaginal prolapse, unspecified: Secondary | ICD-10-CM | POA: Diagnosis present

## 2023-06-17 ENCOUNTER — Other Ambulatory Visit: Payer: Self-pay | Admitting: Internal Medicine

## 2023-06-18 NOTE — Progress Notes (Signed)
#  2nd attempt to contact patient. [facility]

## 2023-06-21 NOTE — Progress Notes (Signed)
Patient has been notified and scheduled for 12/27

## 2023-06-25 ENCOUNTER — Ambulatory Visit: Payer: Medicare Other | Admitting: Obstetrics

## 2023-06-25 ENCOUNTER — Encounter: Payer: Self-pay | Admitting: Obstetrics

## 2023-06-25 VITALS — BP 136/72 | HR 70

## 2023-06-25 DIAGNOSIS — Z87440 Personal history of urinary (tract) infections: Secondary | ICD-10-CM

## 2023-06-25 DIAGNOSIS — R413 Other amnesia: Secondary | ICD-10-CM

## 2023-06-25 DIAGNOSIS — K580 Irritable bowel syndrome with diarrhea: Secondary | ICD-10-CM

## 2023-06-25 DIAGNOSIS — R87610 Atypical squamous cells of undetermined significance on cytologic smear of cervix (ASC-US): Secondary | ICD-10-CM | POA: Insufficient documentation

## 2023-06-25 DIAGNOSIS — N814 Uterovaginal prolapse, unspecified: Secondary | ICD-10-CM

## 2023-06-25 DIAGNOSIS — N3946 Mixed incontinence: Secondary | ICD-10-CM | POA: Diagnosis not present

## 2023-06-25 DIAGNOSIS — M7989 Other specified soft tissue disorders: Secondary | ICD-10-CM | POA: Diagnosis not present

## 2023-06-25 DIAGNOSIS — Z86718 Personal history of other venous thrombosis and embolism: Secondary | ICD-10-CM | POA: Insufficient documentation

## 2023-06-25 NOTE — Assessment & Plan Note (Signed)
-   For treatment of recurrent urinary tract infections, we reviewed management of recurrent UTIs including prophylaxis with a daily low dose antibiotic, transvaginal estrogen therapy, D-mannose, and cranberry supplements.  We discussed the role of diagnostic testing such as cystoscopy and upper tract imaging.   - continue vaginal estrogen 2x/week - continue probiotics - POCT UA negative 11/13 and 06/02/23 - managed by Dr. Annabell Howells, continue daily macrodantin until surgery. Encouraged to discontinue after 3 months of low dose vaginal estrogen use - pt to call if she experience change in urinary symptoms prior to surgery

## 2023-06-25 NOTE — Assessment & Plan Note (Signed)
-   pt underwent Mini mental exam, recommended stress reduction and patient desires to stop working - discussed risk of cognitive dysfunction due to age and major surgery and increased perioperative risks due to age.  Encouraged pt to consider options prior to surgery due to her concerns of memory issues.

## 2023-06-25 NOTE — Assessment & Plan Note (Addendum)
-   For treatment of pelvic organ prolapse, we reviewed options for management including expectant management, conservative management, and surgical management, such as Kegels, a pessary, pelvic floor physical therapy, and specific surgical procedures. - tried size 3 incontinence dish with reduction of leakage prior to most recent pessary check. Declines pessary refitting - We discussed 3 options for prolapse repair:  1) vaginal repair without mesh - Pros - safer, no mesh complications - Cons - not as strong as mesh repair, higher risk of recurrence  2) laparoscopic repair with mesh - Pros - stronger, better long-term success - Cons - risks of mesh implant (erosion into vagina or bladder, adhering to the rectum, pain) - these risks are lower than with a vaginal mesh but still exist  3) Vaginal closure procedure - pros - strong, good long-term success - cons - unable to have penetrative intercourse - discussed lack of sexual activity  - pt declines obliterative procedure, desires to proceed with vaginal suspension.  Prolapse (with or without mesh): Risk factors for surgical failure  include things that put pressure on your pelvis and the surgical repair, including obesity, chronic cough, and heavy lifting or straining (including lifting children or adults, straining on the toilet, or lifting heavy objects such as furniture or anything weighing >25 lbs. Risks of recurrence is 20-30% with vaginal native tissue repair and a less than 10% with sacrocolpopexy with mesh.   - reviewed risks and benefits of uterine preservation vs. Hysterectomy, pt desires to proceed with uterine preservation to minimize OR time. Reviewed risks of need for additional procedures in the future. Pap smear pending.  - 06/16/23 TVUS WNL, 2mm endometrial lining with  no history of PMB - discussed uterosacral ligament suspension, anterior/posterior repair, perineorrhaphy, Explained that prolapse alone will not treat SUI symptoms

## 2023-06-25 NOTE — Assessment & Plan Note (Signed)
-   POCT UA negative 05/12/23, bladder scan 6mL - encouraged splinting to ensure bladder emptying - reviewed Kegel exercises and fluid management. Limit caffeine intake - We discussed the symptoms of overactive bladder (OAB), which include urinary urgency, urinary frequency, nocturia, with or without urge incontinence.  While we do not know the exact etiology of OAB, several treatment options exist. We discussed management including behavioral therapy (decreasing bladder irritants, urge suppression strategies, timed voids, bladder retraining), physical therapy, medication; for refractory cases posterior tibial nerve stimulation, sacral neuromodulation, and intravesical botulinum toxin injection.  For anticholinergic medications, we discussed the potential side effects of anticholinergics including dry eyes, dry mouth, constipation, cognitive impairment and urinary retention. Will not use due to age  For Beta-3 agonist medication, we discussed the potential side effect of elevated blood pressure which is more likely to occur in individuals with uncontrolled hypertension. - discussed possible need for additional treatment after treatment of prolapse - For treatment of stress urinary incontinence,  non-surgical options include expectant management, weight loss, physical therapy, as well as a pessary.  Surgical options include a midurethral sling, Burch urethropexy, and transurethral injection of a bulking agent. - urodynamics 06/03/23 with SUI and OAB noted - reviewed treatment options for SUI with patient and her daughter: 1) Sling: The effectiveness of a midurethral vaginal mesh sling is approximately 85%, and thus, there will be times when you may leak urine after surgery, especially if your bladder is full or if you have a strong cough. There is a balance between making the sling tight enough to treat your leakage but not too tight so that you have long-term difficulty emptying your bladder. A mesh sling  will not directly treat overactive bladder/urge incontinence and may worsen it.  There is an FDA safety notification on vaginal mesh procedures for prolapse but NOT mesh slings. We have extensive experience and training with mesh placement and we have close postoperative follow up to identify any potential complications from mesh. It is important to realize that this mesh is a permanent implant that cannot be easily removed. There are rare risks of mesh exposure (2-4%), pain with intercourse (0-7%), and infection (<1%). The risk of mesh exposure if more likely in a woman with risks for poor healing (prior radiation, poorly controlled diabetes, or immunocompromised). The risk of new or worsened chronic pain after mesh implant is more common in women with baseline chronic pain and/or poorly controlled anxiety or depression. Approximately 2-4% of patients will experience longer-term post-operative voiding dysfunction that may require surgical revision of the sling. We also reviewed that postoperatively, her stream may not be as strong as before surgery.  2. Periurethral bulking (Bulkamid). We discussed success rate of approximately 70-80% and possible need for second injection. We reviewed that this is not a permanent procedure and the Bulkamid does dissolve over time. Risks reviewed including injury to bladder or urethra, UTI, urinary retention and hematuria.  - patient desires to proceed with midurethral sling

## 2023-06-25 NOTE — Assessment & Plan Note (Addendum)
-   D-dimer 1.32 on 09/16/22 with left leg swelling, attributed to toe issues pending surgery now managed with compression socks - negative LE dopplers on 11/25/22 - denies DVT or use of anticoagulation - Caprini score 5 without history of DVT, perioperative heparin use pre-op

## 2023-06-25 NOTE — Assessment & Plan Note (Signed)
-   For constipation, we reviewed the importance of a better bowel regimen.  We also discussed the importance of avoiding chronic straining, as it can exacerbate her pelvic floor symptoms; we discussed treating constipation and straining prior to surgery, as postoperative straining can lead to damage to the repair and recurrence of symptoms. We discussed initiating therapy with increasing fluid intake, fiber supplementation, stool softeners, and laxatives such as miralax.  - continue daily citrucel and continue postop due to improvement in bowel consistency and minimize straining for bowel movements prior to surgery - start miralax postop

## 2023-06-25 NOTE — Assessment & Plan Note (Signed)
-   denies history of abnormal pap smears, negative HPV - reviewed risks and benefits of hysterectomy at the time of prolapse repair, discussed risk of prolonged OR time, additional risk with additional step during surgery. Patient desires to proceed with hysterectomy after discussion with daughter.

## 2023-06-25 NOTE — Progress Notes (Addendum)
[facility] Return Visit  SUBJECTIVE  History of Present Illness: Haley Armstrong is a 77 y.o. female seen in follow-up for stage II pelvic organ prolapse, mixed urinary incontinence, history of recurrent UTI and IBS-D. Plan at last visit was to collect pap smear and TVUS due to desires for uterine preservation.   Patient reports memory concerns and desired to involve her daughter for counseling and decision making.  Pending surgery of right toe for keratotic excess ptotic lesion 07/17/23 per patient by Dr. Charlsie Armstrong Denies size 3 incontinence dish pessary use  Decreased urgency leakage with bladder training, intentional voids during her lunch break, continues to experience 2-3x/day, SUI 1-2x/day and uses 1 pad/day  Continues vaginal estrogen twice a week and daily macrobid for history of rUTI managed by Dr. Annabell Armstrong Reports daily use of Citrucel for constipation  ASCUS pap on 06/09/23 with negative HPV TVUS 06/16/23: "FINDINGS: The uterus is anteverted in position and measures 5.2 x 2.3 x 3.7 cm. It demonstrates a normal, homogeneous echotexture with multiple peripheral calcifications, likely vascular. The endometrium measures 0.2 cm and demonstrates a normal homogeneous echotexture. A cluster of calcifications is visualized within the cervix, only visualized on image #6. Nabothian cysts are also visualized within the cervix.   The bilateral ovaries are not visualized.   There is trace amount of fluid present within the cul-de-sac.   IMPRESSION: 1. Peripheral uterine calcifications, likely vascular.   2.  Cluster of cervical calcifications.   3.  Trace cul-de-sac fluid.   Thank you for allowing [place] to assist in the care of this patient.     Electronically Signed   By: Haley Armstrong M.D.   On: 06/19/2023 09:28"  Past Medical History: Patient  has a past medical history of Anemia, iron deficiency, Anxiety, Blood donor, Family history of coronary artery disease  (06/04/2016), GERD (gastroesophageal reflux disease), Hypertension, IBS (irritable bowel syndrome), Osteoarthritis, and Presence of pessary.   Past Surgical History: She  has a past surgical history that includes Appendectomy; Cholecystectomy; Cervical fusion (06/29/2009); Esophageal manometry (N/A, 11/13/2013); and Tubal ligation.   Medications: She has a current medication list which includes the following prescription(s): acetaminophen, amlodipine, prevagen, bupropion, vitamin d3, diclofenac sodium, enalapril, hyoscyamine, hyoscyamine, ipratropium, levothyroxine, lorazepam, lutein-zeaxanthin, meclizine, meloxicam, methocarbamol, multivitamin,tx-minerals, nitrofurantoin (macrocrystal-monohydrate), NON FORMULARY, omega 3, ondansetron, pantoprazole, pregabalin, promethazine-dextromethorphan, sumatriptan, and tramadol.   Allergies: Patient has no known allergies.   Social History: Patient  reports that she has quit smoking. She has never used smokeless tobacco. She reports current alcohol use. She reports that she does not use drugs.     OBJECTIVE     Physical Exam: Vitals:   06/25/23 1451  BP: 136/72  Pulse: 70   Gen: No apparent distress, A&O x 3.  Detailed Urogynecologic Evaluation:  Deferred. Repeat exam standing, C -5 with valsalva.       No data to display             ASSESSMENT AND PLAN    [name] is a 77 y.o. with:  1. Mixed stress and urge urinary incontinence   2. Uterine prolapse   3. History of recurrent UTI (urinary tract infection)   4. Irritable bowel syndrome with diarrhea   5. Swelling of lower leg   6. Memory problem   7. Atypical squamous cell changes of undetermined significance (ASCUS) on cervical cytology with negative high risk human papilloma virus (HPV) test result     Mixed stress and urge urinary incontinence Assessment &  Plan: - POCT UA negative 05/12/23, bladder scan 6mL - encouraged splinting to ensure bladder emptying - reviewed  Kegel exercises and fluid management. Limit caffeine intake - We discussed the symptoms of overactive bladder (OAB), which include urinary urgency, urinary frequency, nocturia, with or without urge incontinence.  While we do not know the exact etiology of OAB, several treatment options exist. We discussed management including behavioral therapy (decreasing bladder irritants, urge suppression strategies, timed voids, bladder retraining), physical therapy, medication; for refractory cases posterior tibial nerve stimulation, sacral neuromodulation, and intravesical botulinum toxin injection.  For anticholinergic medications, we discussed the potential side effects of anticholinergics including dry eyes, dry mouth, constipation, cognitive impairment and urinary retention. Will not use due to age  For Beta-3 agonist medication, we discussed the potential side effect of elevated blood pressure which is more likely to occur in individuals with uncontrolled hypertension. - discussed possible need for additional treatment after treatment of prolapse - For treatment of stress urinary incontinence,  non-surgical options include expectant management, weight loss, physical therapy, as well as a pessary.  Surgical options include a midurethral sling, Burch urethropexy, and transurethral injection of a bulking agent. - urodynamics 06/03/23 with SUI and OAB noted - reviewed treatment options for SUI with patient and her daughter: 1) Sling: The effectiveness of a midurethral vaginal mesh sling is approximately 85%, and thus, there will be times when you may leak urine after surgery, especially if your bladder is full or if you have a strong cough. There is a balance between making the sling tight enough to treat your leakage but not too tight so that you have long-term difficulty emptying your bladder. A mesh sling will not directly treat overactive bladder/urge incontinence and may worsen it.  There is an FDA safety  notification on vaginal mesh procedures for prolapse but NOT mesh slings. We have extensive experience and training with mesh placement and we have close postoperative follow up to identify any potential complications from mesh. It is important to realize that this mesh is a permanent implant that cannot be easily removed. There are rare risks of mesh exposure (2-4%), pain with intercourse (0-7%), and infection (<1%). The risk of mesh exposure if more likely in a woman with risks for poor healing (prior radiation, poorly controlled diabetes, or immunocompromised). The risk of new or worsened chronic pain after mesh implant is more common in women with baseline chronic pain and/or poorly controlled anxiety or depression. Approximately 2-4% of patients will experience longer-term post-operative voiding dysfunction that may require surgical revision of the sling. We also reviewed that postoperatively, her stream may not be as strong as before surgery.  2. Periurethral bulking (Bulkamid). We discussed success rate of approximately 70-80% and possible need for second injection. We reviewed that this is not a permanent procedure and the Bulkamid does dissolve over time. Risks reviewed including injury to bladder or urethra, UTI, urinary retention and hematuria.  - patient desires to proceed with midurethral sling  Orders: -     Ambulatory Referral For Surgery Scheduling  Uterine prolapse Assessment & Plan: - For treatment of pelvic organ prolapse, we reviewed options for management including expectant management, conservative management, and surgical management, such as Kegels, a pessary, pelvic floor physical therapy, and specific surgical procedures. - tried size 3 incontinence dish with reduction of leakage prior to most recent pessary check. Declines pessary refitting - We discussed 3 options for prolapse repair:  1) vaginal repair without mesh - Pros - safer, no mesh  complications - Cons - not as strong  as mesh repair, higher risk of recurrence  2) laparoscopic repair with mesh - Pros - stronger, better long-term success - Cons - risks of mesh implant (erosion into vagina or bladder, adhering to the rectum, pain) - these risks are lower than with a vaginal mesh but still exist  3) Vaginal closure procedure - pros - strong, good long-term success - cons - unable to have penetrative intercourse - discussed lack of sexual activity  - pt declines obliterative procedure, desires to proceed with vaginal suspension.  Prolapse (with or without mesh): Risk factors for surgical failure  include things that put pressure on your pelvis and the surgical repair, including obesity, chronic cough, and heavy lifting or straining (including lifting children or adults, straining on the toilet, or lifting heavy objects such as furniture or anything weighing >25 lbs. Risks of recurrence is 20-30% with vaginal native tissue repair and a less than 10% with sacrocolpopexy with mesh.   - reviewed risks and benefits of uterine preservation vs. Hysterectomy, pt desires to proceed with uterine preservation to minimize OR time. Reviewed risks of need for additional procedures in the future. Pap smear pending.  - 06/16/23 TVUS WNL, 2mm endometrial lining with  no history of PMB - discussed uterosacral ligament suspension, anterior/posterior repair, perineorrhaphy, Explained that prolapse alone will not treat SUI symptoms  Orders: -     Ambulatory Referral For Surgery Scheduling  History of recurrent UTI (urinary tract infection) Assessment & Plan: - For treatment of recurrent urinary tract infections, we reviewed management of recurrent UTIs including prophylaxis with a daily low dose antibiotic, transvaginal estrogen therapy, D-mannose, and cranberry supplements.  We discussed the role of diagnostic testing such as cystoscopy and upper tract imaging.   - continue vaginal estrogen 2x/week - continue probiotics - POCT UA  negative 11/13 and 06/02/23 - managed by Dr. Annabell Armstrong, continue daily macrodantin until surgery. Encouraged to discontinue after 3 months of low dose vaginal estrogen use - pt to call if she experience change in urinary symptoms prior to surgery   Irritable bowel syndrome with diarrhea Assessment & Plan: - For constipation, we reviewed the importance of a better bowel regimen.  We also discussed the importance of avoiding chronic straining, as it can exacerbate her pelvic floor symptoms; we discussed treating constipation and straining prior to surgery, as postoperative straining can lead to damage to the repair and recurrence of symptoms. We discussed initiating therapy with increasing fluid intake, fiber supplementation, stool softeners, and laxatives such as miralax.  - continue daily citrucel and continue postop due to improvement in bowel consistency and minimize straining for bowel movements prior to surgery - start miralax postop   Swelling of lower leg Assessment & Plan: - D-dimer 1.32 on 09/16/22 with left leg swelling, attributed to toe issues pending surgery now managed with compression socks - negative LE dopplers on 11/25/22 - denies DVT or use of anticoagulation - Caprini score 5 without history of DVT, perioperative heparin use pre-op   Memory problem Assessment & Plan: - pt underwent Mini mental exam, recommended stress reduction and patient desires to stop working - discussed risk of cognitive dysfunction due to age and major surgery and increased perioperative risks due to age.  Encouraged pt to consider options prior to surgery due to her concerns of memory issues.    Atypical squamous cell changes of undetermined significance (ASCUS) on cervical cytology with negative high risk human papilloma virus (HPV) test result Assessment &  Plan: - denies history of abnormal pap smears, negative HPV - reviewed risks and benefits of hysterectomy at the time of prolapse repair,  discussed risk of prolonged OR time, additional risk with additional step during surgery. Patient desires to proceed with hysterectomy after discussion with daughter.    Plan for surgery: Exam under anesthesia, TVH, BSO, uterosacral ligament suspension, anterior/posterior repair, perineorrhaphy, midurethral sling, cytosurethroscopy.   - We reviewed the patient's specific anatomic and functional findings, with the assistance of diagrams, and together finalized the above procedure. The planned surgical procedures were discussed along with the surgical risks outlined below, which were also provided on a detailed handout. Additional treatment options including expectant management, conservative management, medical management were discussed where appropriate.  We reviewed the benefits and risks of each treatment option.    General Surgical Risks: For all procedures, there are risks of bleeding, infection, damage to surrounding organs including but not limited to bowel, bladder, blood vessels, ureters and nerves, and need for further surgery if an injury were to occur. These risks are all low with minimally invasive surgery.    There are risks of numbness and weakness at any body site or buttock/rectal pain.  It is possible that baseline pain can be worsened by surgery, either with or without mesh. If surgery is vaginal, there is also a low risk of possible conversion to laparoscopy or open abdominal incision where indicated. Very rare risks include blood transfusion, blood clot, heart attack, pneumonia, or death.    There is also a risk of short-term postoperative urinary retention with need to use a catheter. About half of patients need to go home from surgery with a catheter, which is then later removed in the office. The risk of long-term need for a catheter is very low. There is also a risk of worsening of overactive bladder.    - For preop Visit:  She is required to have a visit within 30 days of her  surgery.    Today we reviewed pre-operative preparation, peri-operative expectations, and post-operative instructions/recovery.  She was provided with instructional handouts. She understands not to take aspirin (>81mg ) or NSAIDs 7 days prior to surgery. Prescriptions will be provided pending scheduled surgery: Oxycodone 5mg , Tylenol 500mg , Miralax. Avoids NSAIDs due to Meloxicam use. These prescriptions will be sent prior to surgery. Uses ultram, robaxin, and Lyrica at baseline for neck pain   - Medical clearance: required Letter sent to primary care provider requesting risk stratification and medical optimization due to blood pressure, venous stasis rash, and age. Pending toe surgery 07/17/23 with 2 week recovery requiring boot that will limit positioning during surgery.  - Anticoagulant use: Yes, denies history of DVT and will give perioperative heparin  - Medicaid Hysterectomy form: No - Accepts blood transfusion: Yes - Expected length of stay: outpatient    Time spent: I spent 97 minutes dedicated to the care of this patient on the date of this encounter to include pre-visit review of records, face-to-face time with the patient discussing along with phone conversation with patient's daughter reviewing cognitive dysfunction concerns, mixed urinary incontinence, pelvic organ prolapse, left left swelling, ASCUS pap, and post visit documentation.    Loleta Chance, MD

## 2023-06-27 ENCOUNTER — Encounter: Payer: Self-pay | Admitting: Podiatry

## 2023-06-28 NOTE — Telephone Encounter (Signed)
I would think a couple of weeks should work

## 2023-06-29 ENCOUNTER — Telehealth: Payer: Self-pay | Admitting: Podiatry

## 2023-06-29 NOTE — Telephone Encounter (Signed)
 DOS- 07/05/23   EXOSTECTOMY 5TH RT- 71891  [employer] EFFECTIVE DATE- 06/29/22  DEDUCTIBLE-$0.00 WITH REMAINING $0.00 OOP-$3600.00 WITH REMAINING $2356.98 COINSURANCE- 0%  SPOKE WITH IRRA O. FROM [employer] AND SHE STATED THAT PRIOR AUTH IS NOT REQUIRED FOR CPT CODE 71891.  CALL REF #: [id number]

## 2023-06-30 ENCOUNTER — Other Ambulatory Visit: Payer: Self-pay | Admitting: Internal Medicine

## 2023-07-05 ENCOUNTER — Ambulatory Visit (INDEPENDENT_AMBULATORY_CARE_PROVIDER_SITE_OTHER): Payer: Medicare Other

## 2023-07-05 ENCOUNTER — Ambulatory Visit: Payer: Medicare Other | Admitting: Podiatry

## 2023-07-05 DIAGNOSIS — M898X9 Other specified disorders of bone, unspecified site: Secondary | ICD-10-CM

## 2023-07-06 ENCOUNTER — Encounter: Payer: Self-pay | Admitting: Obstetrics

## 2023-07-06 NOTE — Progress Notes (Signed)
 Subjective:   Patient ID: Haley Armstrong, female   DOB: 78 y.o.   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  Exostectomy digit 5 right incision was made on the medial side digit 5 right with some elliptical incision over the prominent bone structure.  The incision was deepened down to bone and the bone was freed identified and sidecutting bur introduced into the site to reduce the bone spur.  This was done entirely with bone paste created which was flushed out and then the area was sutured with 5-0 nylon sterile dressing applied tourniquet released cap refill to be immediate patient left the OR in satisfactory condition and will be seen back 1 week earlier if needed

## 2023-07-12 DIAGNOSIS — M170 Bilateral primary osteoarthritis of knee: Secondary | ICD-10-CM | POA: Diagnosis not present

## 2023-07-19 ENCOUNTER — Encounter: Payer: Self-pay | Admitting: Podiatry

## 2023-07-19 ENCOUNTER — Ambulatory Visit (INDEPENDENT_AMBULATORY_CARE_PROVIDER_SITE_OTHER): Payer: Medicare Other

## 2023-07-19 ENCOUNTER — Telehealth: Payer: Self-pay | Admitting: Podiatry

## 2023-07-19 ENCOUNTER — Ambulatory Visit (INDEPENDENT_AMBULATORY_CARE_PROVIDER_SITE_OTHER): Payer: Medicare Other | Admitting: Podiatry

## 2023-07-19 DIAGNOSIS — M7751 Other enthesopathy of right foot: Secondary | ICD-10-CM

## 2023-07-19 NOTE — Telephone Encounter (Signed)
Completed FMLA paperwork received from [employer] (employer) ....   Today is the RTW per Dr. Charlsie Merles and the letter he provided states same.  E-mail'd completed docs to Rob Social research officer, government) to [email or web address]> ....  Called patient to advise same .Marland Kitchen...      J. Abbott -- 07/19/2023

## 2023-07-20 NOTE — Progress Notes (Signed)
Subjective:   Patient ID: Haley Armstrong, female   DOB: 78 y.o.   MRN: [id number]   HPI Patient states right fifth toe is doing well very pleased with surgery   ROS      Objective:  Physical Exam  Neuro vascular status intact negative Denna Haggard' sign noted wound edges coapted well fifth digit right good alignment noted no drainage     Assessment:  Doing well post exostectomy digit 5 right     Plan:  H&P reviewed stitches removed wound edges coapted well applied Band-Aid advised on gradual return to wide shoe gear reappoint to recheck  X-rays indicate satisfactory resection of bone on the inside of the fifth digit right

## 2023-07-27 ENCOUNTER — Other Ambulatory Visit: Payer: Self-pay | Admitting: Internal Medicine

## 2023-08-04 DIAGNOSIS — M5412 Radiculopathy, cervical region: Secondary | ICD-10-CM | POA: Diagnosis not present

## 2023-08-13 ENCOUNTER — Other Ambulatory Visit: Payer: Self-pay | Admitting: Internal Medicine

## 2023-08-16 ENCOUNTER — Ambulatory Visit
Admission: EM | Admit: 2023-08-16 | Discharge: 2023-08-16 | Disposition: A | Discharge status: Home / Self Care | Payer: Medicare Other | Attending: Family Medicine | Admitting: Family Medicine

## 2023-08-16 DIAGNOSIS — J069 Acute upper respiratory infection, unspecified: Secondary | ICD-10-CM

## 2023-08-16 MED ORDER — PREDNISONE 20 MG PO TABS
40.0000 mg | ORAL_TABLET | Freq: Every day | ORAL | 0 refills | Status: AC
Start: 2023-08-16 — End: 2023-08-21

## 2023-08-16 MED ORDER — ALBUTEROL SULFATE HFA 108 (90 BASE) MCG/ACT IN AERS
1.0000 | INHALATION_SPRAY | Freq: Four times a day (QID) | RESPIRATORY_TRACT | 0 refills | Status: DC | PRN
Start: 2023-08-16 — End: 2024-01-19

## 2023-08-16 NOTE — ED Triage Notes (Signed)
Pt states that she has a cough, chest congestion, nasal congestion and facial pressure. X5 days

## 2023-08-16 NOTE — Discharge Instructions (Addendum)
Albuterol inhaler as needed for shortness of breath.  Start prednisone daily for 5 days.  Continue over-the-counter cough medicine as needed.  Lots of rest and fluids.  Please follow-up with your PCP if your symptoms do not improve.  Please go to the ER for any worsening symptoms.  Hope you feel better soon!

## 2023-08-16 NOTE — ED Provider Notes (Signed)
[facility]    CSN: [id number] Arrival date & time: 08/16/23  1507      History   Chief Complaint Chief Complaint  Patient presents with   Cough    Cough, chest congestion, nasal congestion and facial pressure x5 days    HPI Haley Armstrong is a 78 y.o. female  presents for evaluation of URI symptoms for 5 days. Patient reports associated symptoms of cough, congestion and some shortness of breath with coughing. Denies N/V/D, fevers, sore throat, ear pain, body aches. Patient does not have a hx of asthma. Patient is a smoker and patient states she was told by her PCP that she has COPD but is not listed on her chart and she does not use any inhalers.  States cough is dry.  Pt has taken Delsym OTC for symptoms. Pt has no other concerns at this time.    Cough Associated symptoms: shortness of breath     Past Medical History:  Diagnosis Date   Anemia, iron deficiency    Anxiety    Blood donor    Family history of coronary artery disease 06/04/2016   GERD (gastroesophageal reflux disease)    Hypertension    IBS (irritable bowel syndrome)    Osteoarthritis    Presence of pessary     Patient Active Problem List   Diagnosis Date Noted   History of DVT (deep vein thrombosis) 06/25/2023   Atypical squamous cell changes of undetermined significance (ASCUS) on cervical cytology with negative high risk human papilloma virus (HPV) test result 06/25/2023   History of recurrent UTI (urinary tract infection) 05/12/2023   Acute cystitis without hematuria 01/01/2023   Benign positional vertigo 07/06/2022   Presbycusis of both ears 07/06/2022   Dysuria 09/30/2021   History of COVID-19 09/30/2021   Vertigo 09/30/2021   Mastoid pain, left 09/30/2021   Reactive depression (situational) 07/29/2021   Acute non-recurrent frontal sinusitis 05/14/2021   Nasal sore 05/14/2021   DVT prophylaxis 01/14/2021   Uterine prolapse 09/16/2020   Paronychia of great toe 09/03/2020   Mixed  stress and urge urinary incontinence 09/03/2020   Acute sinusitis 06/13/2020   Migraines 01/04/2020   Arthralgia 01/04/2020   Left leg pain 07/26/2019   Vestibular neuritis 06/27/2019   Rotator cuff tear arthropathy, right 05/18/2019   Family history of coronary artery disease 06/04/2016   Cellulitis of foot 02/07/2016   Chest pain 05/16/2015   Degenerative spondylolisthesis 12/06/2013   Well adult exam 03/09/2013   Edema 03/09/2013   Pain in left foot 03/08/2013   ADD (attention deficit disorder) 06/15/2012   Compulsive behavior disorder (HCC) 06/15/2012   Acute left lumbar radiculopathy 10/18/2011   Iron deficiency anemia 10/21/2010   History of esophageal spasm 10/21/2010   FATIGUE, ACUTE 07/02/2009   TOBACCO USE, QUIT 07/02/2009   NONSPEC ABN FINDNG RAD & OTH EXAM ABDOMINAL AREA 04/11/2009   Diarrhea 04/09/2009   ABDOMINAL PAIN-EPIGASTRIC 04/09/2009   Anxiety disorder 02/24/2009   Rash and nonspecific skin eruption 09/30/2007   Memory problem 09/21/2007   Goiter, unspecified 09/09/2007   HTN (hypertension) 05/24/2007   PARESTHESIA 05/24/2007   Cervicalgia 05/20/2007   Other abnormal glucose 05/20/2007   GERD 04/06/2007   Irritable bowel syndrome 04/06/2007   Osteoarthritis 04/06/2007   LOW BACK PAIN 04/06/2007   TROCHANTERIC BURSITIS 04/06/2007    Past Surgical History:  Procedure Laterality Date   APPENDECTOMY     CERVICAL FUSION  06/29/2009   C4-5 Dr Alveda Reasons   CHOLECYSTECTOMY  ESOPHAGEAL MANOMETRY N/A 11/13/2013   Procedure: ESOPHAGEAL MANOMETRY (EM);  Surgeon: Hilarie Fredrickson, MD;  Location: [facility];  Service: Endoscopy;  Laterality: N/A;   TUBAL LIGATION      OB History     Gravida  2   Para  2   Term  2   Preterm      AB      Living  2      SAB      IAB      Ectopic      Multiple      Live Births  2            Home Medications    Prior to Admission medications   Medication Sig Start Date End Date Taking? Authorizing  Provider  acetaminophen (TYLENOL) 325 MG tablet Take 2 tablets (650 mg total) by mouth every 6 (six) hours as needed for moderate pain. 03/28/23  Yes Wallis Bamberg, PA-C  albuterol (VENTOLIN HFA) 108 (90 Base) MCG/ACT inhaler Inhale 1-2 puffs into the lungs every 6 (six) hours as needed. 08/16/23  Yes Radford Pax, NP  amLODipine (NORVASC) 2.5 MG tablet Take 1 tablet (2.5 mg total) by mouth daily. 11/16/22  Yes [name], Georgina Quint, MD  Apoaequorin (PREVAGEN) 10 MG CAPS Take by mouth.   Yes [provider]  buPROPion (WELLBUTRIN XL) 150 MG 24 hr tablet Take 1 tablet (150 mg total) by mouth daily. Take in am 02/17/23  Yes [name], Georgina Quint, MD  Cholecalciferol (VITAMIN D3) 50 MCG (2000 UT) capsule Take 1 capsule (2,000 Units total) by mouth daily. 01/04/20  Yes [name], Georgina Quint, MD  diclofenac sodium (VOLTAREN) 1 % GEL Apply 4 g topically 4 (four) times daily. 01/08/16  Yes Regal, Kirstie Peri, DPM  enalapril (VASOTEC) 10 MG tablet Take 1 tablet (10 mg total) by mouth 2 (two) times daily. 11/16/22  Yes [name], Georgina Quint, MD  hyoscyamine (LEVBID) 0.375 MG 12 hr tablet TAKE 1 TABLET BY MOUTH 2 TIMES A DAY 08/14/23  Yes [name], Georgina Quint, MD  hyoscyamine (LEVSIN SL) 0.125 MG SL tablet DISSOLVE 1-2 TABLETS UNDER THE TONGUE EVERY FOUR HOURS AS NEEDED FOR PAIN 02/17/23  Yes [name], Georgina Quint, MD  ipratropium (ATROVENT) 0.03 % nasal spray Place 2 sprays into both nostrils 2 (two) times daily. 03/28/23  Yes Wallis Bamberg, PA-C  levothyroxine (SYNTHROID) 25 MCG tablet TAKE ONE TABLET BY MOUTH EVERY DAY BEFORE BREAKFAST 11/16/22  Yes [name], Georgina Quint, MD  LORazepam (ATIVAN) 0.5 MG tablet TAKE 1 BY MOUTH EVERY NIGHT AT BEDTIME AS NEEDED FOR SLEEP 07/01/23  Yes [name], Georgina Quint, MD  Lutein-Zeaxanthin 25-5 MG CAPS Take by mouth. Takes one daily   Yes [provider]  meclizine (ANTIVERT) 12.5 MG tablet Take 1-2 tablets (12.5-25 mg total) by mouth 3 (three) times daily as needed for dizziness.  02/05/22  Yes [name], Georgina Quint, MD  meloxicam (MOBIC) 15 MG tablet TAKE 1 TABLET BY MOUTH EVERY DAY AFTER SURGERY AS NEEDED 04/19/23  Yes [name], Georgina Quint, MD  methocarbamol (ROBAXIN) 500 MG tablet Take 1 tablet (500 mg total) by mouth every 6 (six) hours as needed. 02/17/23  Yes [name], Georgina Quint, MD  Multiple Vitamins-Minerals (MULTIVITAMIN,TX-MINERALS) tablet Take 1 tablet by mouth daily.     Yes [provider]  nitrofurantoin, macrocrystal-monohydrate, (MACROBID) 100 MG capsule Take 1 capsule (100 mg total) by mouth 2 (two) times daily. Patient taking differently: Take 100 mg by mouth daily. 02/08/23  Yes  Myrlene Broker, MD  NON FORMULARY Vitamin C, Osteo BiFlex, Vitamin B12 - PO daily   Yes [provider]  Omega 3 1000 MG CAPS Take 2 capsules by mouth daily.   Yes [provider]  ondansetron (ZOFRAN-ODT) 4 MG disintegrating tablet Take 1 tablet (4 mg total) by mouth every 8 (eight) hours as needed for nausea or vomiting. 09/16/21  Yes Petrucelli, Samantha R, PA-C  pantoprazole (PROTONIX) 40 MG tablet TAKE 1 TABLET BY MOUTH EVERY DAY 05/18/23  Yes [name], Georgina Quint, MD  predniSONE (DELTASONE) 20 MG tablet Take 2 tablets (40 mg total) by mouth daily with breakfast for 5 days. 08/16/23 08/21/23 Yes Radford Pax, NP  pregabalin (LYRICA) 150 MG capsule Take 150 mg by mouth 2 (two) times daily. 10/22/22  Yes [provider]  promethazine-dextromethorphan (PROMETHAZINE-DM) 6.25-15 MG/5ML syrup Take 2.5 mLs by mouth 3 (three) times daily as needed for cough. 03/28/23  Yes Wallis Bamberg, PA-C  SUMAtriptan (IMITREX) 100 MG tablet Take 1 tablet (100 mg total) by mouth every 2 (two) hours as needed for migraine. May repeat in 2 hours if headache persists or recurs. 01/03/21  Yes [name], Georgina Quint, MD  traMADol (ULTRAM) 50 MG tablet TAKE 1-2 TABLETS (50-100 MG TOTAL) BY MOUTH 3 (THREE) TIMES DAILY AS NEEDED FOR SEVERE PAIN. 06/09/23  Yes [name], Georgina Quint,  MD    Family History Family History  Problem Relation Age of Onset   Diabetes Mother    Uterine cancer Mother 50       uterine sarcoma   Lymphoma Mother    Dementia Mother    Hypothyroidism Mother    Parkinsonism Father    Diabetes Brother    Heart disease Brother        CAD   Heart attack Brother    Diabetes Paternal Grandmother    Heart disease Maternal Aunt    Heart attack Maternal Aunt    Colon cancer Neg Hx    Esophageal cancer Neg Hx    Rectal cancer Neg Hx    Stomach cancer Neg Hx     Social History Social History   Tobacco Use   Smoking status: Former   Smokeless tobacco: Never  Vaping Use   Vaping status: Never Used  Substance Use Topics   Alcohol use: Yes    Comment: occ   Drug use: No     Allergies   Patient has no known allergies.   Review of Systems Review of Systems  HENT:  Positive for congestion.   Respiratory:  Positive for cough and shortness of breath.      Physical Exam Triage Vital Signs ED Triage Vitals  Encounter Vitals Group     BP 08/16/23 1628 (!) 151/73     Systolic BP Percentile --      Diastolic BP Percentile --      Pulse Rate 08/16/23 1628 70     Resp 08/16/23 1628 17     Temp 08/16/23 1628 98 F (36.7 C)     Temp Source 08/16/23 1628 Oral     SpO2 08/16/23 1628 98 %     Weight 08/16/23 1627 145 lb (65.8 kg)     Height 08/16/23 1627 5\' 4"  (1.626 m)     Head Circumference --      Peak Flow --      Pain Score 08/16/23 1627 0     Pain Loc --      Pain Education --  Exclude from Growth Chart --    No data found.  Updated Vital Signs BP (!) 151/73 (BP Location: Left Arm)   Pulse 70   Temp 98 F (36.7 C) (Oral)   Resp 17   Ht 5\' 4"  (1.626 m)   Wt 145 lb (65.8 kg)   SpO2 98%   BMI 24.89 kg/m   Visual Acuity Right Eye Distance:   Left Eye Distance:   Bilateral Distance:    Right Eye Near:   Left Eye Near:    Bilateral Near:     Physical Exam Vitals and nursing note reviewed.  Constitutional:       General: She is not in acute distress.    Appearance: She is well-developed. She is not ill-appearing.  HENT:     Head: Normocephalic and atraumatic.     Right Ear: Tympanic membrane and ear canal normal.     Left Ear: Tympanic membrane and ear canal normal.     Nose: Congestion present.     Mouth/Throat:     Mouth: Mucous membranes are moist.     Pharynx: Oropharynx is clear. Uvula midline. No oropharyngeal exudate or posterior oropharyngeal erythema.     Tonsils: No tonsillar exudate or tonsillar abscesses.  Eyes:     Conjunctiva/sclera: Conjunctivae normal.     Pupils: Pupils are equal, round, and reactive to light.  Cardiovascular:     Rate and Rhythm: Normal rate and regular rhythm.     Heart sounds: Normal heart sounds.  Pulmonary:     Effort: Pulmonary effort is normal.     Breath sounds: Normal breath sounds. No wheezing or rhonchi.  Musculoskeletal:     Cervical back: Normal range of motion and neck supple.  Lymphadenopathy:     Cervical: No cervical adenopathy.  Skin:    General: Skin is warm and dry.  Neurological:     General: No focal deficit present.     Mental Status: She is alert and oriented to person, place, and time.  Psychiatric:        Mood and Affect: Mood normal.        Behavior: Behavior normal.      UC Treatments / Results  Labs (all labs ordered are listed, but only abnormal results are displayed) Labs Reviewed - No data to display  EKG   Radiology No results found.  Procedures Procedures (including critical care time)  Medications Ordered in UC Medications - No data to display  Initial Impression / Assessment and Plan / UC Course  I have reviewed the triage vital signs and the nursing notes.  Pertinent labs & imaging results that were available during my care of the patient were reviewed by me and considered in my medical decision making (see chart for details).     Reviewed exam and symptoms with patient.  No red flags.   Discussed likely viral illness and symptomatic treatment.  Given reported history of COPD will do prednisone and albuterol inhaler.  Do not feel antibiotics are indicated at this time.  Discussed continued symptomatic treatment.  PCP follow-up if symptoms do not improve.  ER precautions reviewed and patient verbalized understanding. Final Clinical Impressions(s) / UC Diagnoses   Final diagnoses:  Viral upper respiratory illness     Discharge Instructions      Albuterol inhaler as needed for shortness of breath.  Start prednisone daily for 5 days.  Continue over-the-counter cough medicine as needed.  Lots of rest and fluids.  Please follow-up with  your PCP if your symptoms do not improve.  Please go to the ER for any worsening symptoms.  Hope you feel better soon!    ED Prescriptions     Medication Sig Dispense Auth. Provider   albuterol (VENTOLIN HFA) 108 (90 Base) MCG/ACT inhaler Inhale 1-2 puffs into the lungs every 6 (six) hours as needed. 1 each Radford Pax, NP   predniSONE (DELTASONE) 20 MG tablet Take 2 tablets (40 mg total) by mouth daily with breakfast for 5 days. 10 tablet Radford Pax, NP      PDMP not reviewed this encounter.   Radford Pax, NP 08/16/23 [phone]

## 2023-08-20 ENCOUNTER — Ambulatory Visit: Payer: Self-pay | Admitting: Internal Medicine

## 2023-08-20 NOTE — Telephone Encounter (Signed)
Copied from CRM [id number]. Topic: Clinical - Red Word Triage >> Aug 20, 2023  1:22 PM Deaijah H wrote: Red Word that prompted transfer to Nurse Triage: Upper resp. infection Plenty of congestion worse than before   Chief Complaint: cough Symptoms: runny nose post nasal drip, dry cough,mild SOB Frequency: Mon Pertinent Negatives: Patient denies fever Disposition: [] ED /[] Urgent Care (no appt availability in office) / [] Appointment(In office/virtual)/ []  [facility]/ [x] Home Care/ [] Refused Recommended Disposition /[] [facility]/ []  Follow-up with PCP Additional Notes:   Reason for Disposition  Cough with cold symptoms (e.g., runny nose, postnasal drip, throat clearing)  Answer Assessment - Initial Assessment Questions 1. ONSET: "When did the cough begin?"     Monday  2. SEVERITY: "How bad is the cough today?"      Coughing episodes 3. SPUTUM: "Describe the color of your sputum" (none, dry cough; clear, white, yellow, green)     none 4. HEMOPTYSIS: "Are you coughing up any blood?" If so ask: "How much?" (flecks, streaks, tablespoons, etc.)     no 5. DIFFICULTY BREATHING: "Are you having difficulty breathing?" If Yes, ask: "How bad is it?" (e.g., mild, moderate, severe)    - MILD: No SOB at rest, mild SOB with walking, speaks normally in sentences, can lie down, no retractions, pulse < 100.    - MODERATE: SOB at rest, SOB with minimal exertion and prefers to sit, cannot lie down flat, speaks in phrases, mild retractions, audible wheezing, pulse 100-120.    - SEVERE: Very SOB at rest, speaks in single words, struggling to breathe, sitting hunched forward, retractions, pulse > 120      mild 6. FEVER: "Do you have a fever?" If Yes, ask: "What is your temperature, how was it measured, and when did it start?"     no 7. CARDIAC HISTORY: "Do you have any history of heart disease?" (e.g., heart attack, congestive heart failure)      no 8. LUNG HISTORY: "Do you have any  history of lung disease?"  (e.g., pulmonary embolus, asthma, emphysema)     no 10. OTHER SYMPTOMS: "Do you have any other symptoms?" (e.g., runny nose, wheezing, chest pain)       Runny nose, nasal congestion, loose stool  Protocols used: Cough - Acute Productive-A-AH

## 2023-08-24 ENCOUNTER — Encounter: Payer: Self-pay | Admitting: Internal Medicine

## 2023-08-24 ENCOUNTER — Ambulatory Visit (INDEPENDENT_AMBULATORY_CARE_PROVIDER_SITE_OTHER): Payer: Medicare Other | Admitting: Internal Medicine

## 2023-08-24 ENCOUNTER — Other Ambulatory Visit: Payer: Self-pay | Admitting: Internal Medicine

## 2023-08-24 VITALS — BP 130/60 | HR 69 | Temp 98.5°F | Ht 64.0 in | Wt 149.0 lb

## 2023-08-24 DIAGNOSIS — M5412 Radiculopathy, cervical region: Secondary | ICD-10-CM

## 2023-08-24 DIAGNOSIS — L853 Xerosis cutis: Secondary | ICD-10-CM | POA: Insufficient documentation

## 2023-08-24 DIAGNOSIS — F413 Other mixed anxiety disorders: Secondary | ICD-10-CM

## 2023-08-24 DIAGNOSIS — I10 Essential (primary) hypertension: Secondary | ICD-10-CM

## 2023-08-24 MED ORDER — TRIAMCINOLONE ACETONIDE 0.5 % EX CREA: 1.0000 | TOPICAL_CREAM | Freq: Four times a day (QID) | CUTANEOUS | 1 refills | Status: AC

## 2023-08-24 MED ORDER — TRAMADOL HCL 50 MG PO TABS: 50.0000 mg | ORAL_TABLET | Freq: Three times a day (TID) | ORAL | 1 refills | Status: DC | PRN

## 2023-08-24 MED ORDER — METHOCARBAMOL 500 MG PO TABS: 500.0000 mg | ORAL_TABLET | Freq: Four times a day (QID) | ORAL | 3 refills | Status: AC | PRN

## 2023-08-24 NOTE — Assessment & Plan Note (Signed)
  On Enalapril 10 mg bid, Norvasc

## 2023-08-24 NOTE — Assessment & Plan Note (Signed)
 Joe - her step father is at a NH ([facility]) 78 yo -- stress Using Lorazepam sometimes  Potential benefits of a long term benzodiazepines  use as well as potential risks  and complications were explained to the patient and were aknowledged.

## 2023-08-24 NOTE — Progress Notes (Signed)
 Subjective:  Patient ID: Haley Armstrong, female    DOB: 03-May-1946  Age: 78 y.o. MRN: [id number]  CC: Medical Management of Chronic Issues (3 mnth f/u)   HPI Haley Armstrong presents for neck pain - planning to have cervical fusion C5-6 w/Dr Danielle Dess on 08/30/23. Pt stopped Meloxicam C/o fingertip skin cracks    Outpatient Medications Prior to Visit  Medication Sig Dispense Refill   acetaminophen (TYLENOL) 325 MG tablet Take 2 tablets (650 mg total) by mouth every 6 (six) hours as needed for moderate pain. 30 tablet 0   albuterol (VENTOLIN HFA) 108 (90 Base) MCG/ACT inhaler Inhale 1-2 puffs into the lungs every 6 (six) hours as needed. 1 each 0   amLODipine (NORVASC) 2.5 MG tablet Take 1 tablet (2.5 mg total) by mouth daily. 90 tablet 3   Apoaequorin (PREVAGEN) 10 MG CAPS Take by mouth.     buPROPion (WELLBUTRIN XL) 150 MG 24 hr tablet Take 1 tablet (150 mg total) by mouth daily. Take in am 90 tablet 1   Cholecalciferol (VITAMIN D3) 50 MCG (2000 UT) capsule Take 1 capsule (2,000 Units total) by mouth daily. 100 capsule 3   diclofenac sodium (VOLTAREN) 1 % GEL Apply 4 g topically 4 (four) times daily. 100 Tube 11   enalapril (VASOTEC) 10 MG tablet Take 1 tablet (10 mg total) by mouth 2 (two) times daily. 60 tablet 11   hyoscyamine (LEVBID) 0.375 MG 12 hr tablet TAKE 1 TABLET BY MOUTH 2 TIMES A DAY 60 tablet 1   hyoscyamine (LEVSIN SL) 0.125 MG SL tablet DISSOLVE 1-2 TABLETS UNDER THE TONGUE EVERY FOUR HOURS AS NEEDED FOR PAIN 100 tablet 2   ipratropium (ATROVENT) 0.03 % nasal spray Place 2 sprays into both nostrils 2 (two) times daily. 30 mL 0   LORazepam (ATIVAN) 0.5 MG tablet TAKE 1 BY MOUTH EVERY NIGHT AT BEDTIME AS NEEDED FOR SLEEP 30 tablet 3   Lutein-Zeaxanthin 25-5 MG CAPS Take by mouth. Takes one daily     meclizine (ANTIVERT) 12.5 MG tablet Take 1-2 tablets (12.5-25 mg total) by mouth 3 (three) times daily as needed for dizziness. 30 tablet 0   meloxicam (MOBIC) 15 MG tablet TAKE  1 TABLET BY MOUTH EVERY DAY AFTER SURGERY AS NEEDED 90 tablet 0   Multiple Vitamins-Minerals (MULTIVITAMIN,TX-MINERALS) tablet Take 1 tablet by mouth daily.       nitrofurantoin, macrocrystal-monohydrate, (MACROBID) 100 MG capsule Take 1 capsule (100 mg total) by mouth 2 (two) times daily. (Patient taking differently: Take 100 mg by mouth daily.) 14 capsule 0   NON FORMULARY Vitamin C, Osteo BiFlex, Vitamin B12 - PO daily     Omega 3 1000 MG CAPS Take 2 capsules by mouth daily.     ondansetron (ZOFRAN-ODT) 4 MG disintegrating tablet Take 1 tablet (4 mg total) by mouth every 8 (eight) hours as needed for nausea or vomiting. 5 tablet 0   pantoprazole (PROTONIX) 40 MG tablet TAKE 1 TABLET BY MOUTH EVERY DAY 90 tablet 3   pregabalin (LYRICA) 150 MG capsule Take 150 mg by mouth 2 (two) times daily.     promethazine-dextromethorphan (PROMETHAZINE-DM) 6.25-15 MG/5ML syrup Take 2.5 mLs by mouth 3 (three) times daily as needed for cough. 100 mL 0   SUMAtriptan (IMITREX) 100 MG tablet Take 1 tablet (100 mg total) by mouth every 2 (two) hours as needed for migraine. May repeat in 2 hours if headache persists or recurs. 10 tablet 2   levothyroxine (SYNTHROID) 25 MCG  tablet TAKE ONE TABLET BY MOUTH EVERY DAY BEFORE BREAKFAST 90 tablet 3   methocarbamol (ROBAXIN) 500 MG tablet Take 1 tablet (500 mg total) by mouth every 6 (six) hours as needed. 120 tablet 3   traMADol (ULTRAM) 50 MG tablet TAKE 1-2 TABLETS (50-100 MG TOTAL) BY MOUTH 3 (THREE) TIMES DAILY AS NEEDED FOR SEVERE PAIN. 90 tablet 1   No facility-administered medications prior to visit.    ROS: Review of Systems  Constitutional:  Negative for activity change, appetite change, chills, fatigue and unexpected weight change.  HENT:  Negative for congestion, mouth sores and sinus pressure.   Eyes:  Negative for visual disturbance.  Respiratory:  Negative for cough and chest tightness.   Gastrointestinal:  Negative for abdominal pain and nausea.   Genitourinary:  Negative for difficulty urinating, frequency and vaginal pain.  Musculoskeletal:  Positive for arthralgias, back pain, neck pain and neck stiffness. Negative for gait problem.  Skin:  Negative for pallor and rash.  Neurological:  Negative for dizziness, tremors, weakness, numbness and headaches.  Psychiatric/Behavioral:  Negative for confusion and sleep disturbance.     Objective:  BP 130/60   Pulse 69   Temp 98.5 F (36.9 C) (Oral)   Ht 5\' 4"  (1.626 m)   Wt 149 lb (67.6 kg)   SpO2 93%   BMI 25.58 kg/m   BP Readings from Last 3 Encounters:  08/24/23 130/60  08/16/23 (!) 151/73  06/25/23 136/72    Wt Readings from Last 3 Encounters:  08/24/23 149 lb (67.6 kg)  08/16/23 145 lb (65.8 kg)  06/07/23 146 lb (66.2 kg)    Physical Exam Constitutional:      General: She is not in acute distress.    Appearance: Normal appearance. She is well-developed.  HENT:     Head: Normocephalic.     Right Ear: External ear normal.     Left Ear: External ear normal.     Nose: Nose normal.  Eyes:     General:        Right eye: No discharge.        Left eye: No discharge.     Conjunctiva/sclera: Conjunctivae normal.     Pupils: Pupils are equal, round, and reactive to light.  Neck:     Thyroid: No thyromegaly.     Vascular: No JVD.     Trachea: No tracheal deviation.  Cardiovascular:     Rate and Rhythm: Normal rate and regular rhythm.     Heart sounds: Normal heart sounds.  Pulmonary:     Effort: No respiratory distress.     Breath sounds: No stridor. No wheezing.  Abdominal:     General: Bowel sounds are normal. There is no distension.     Palpations: Abdomen is soft. There is no mass.     Tenderness: There is no abdominal tenderness. There is no guarding or rebound.  Musculoskeletal:        General: No tenderness.     Cervical back: Normal range of motion and neck supple. No rigidity.     Right lower leg: No edema.     Left lower leg: No edema.   Lymphadenopathy:     Cervical: No cervical adenopathy.  Skin:    Findings: No erythema or rash.  Neurological:     Mental Status: She is oriented to person, place, and time.     Cranial Nerves: No cranial nerve deficit.     Motor: No abnormal muscle tone.  Coordination: Coordination normal.     Deep Tendon Reflexes: Reflexes normal.  Psychiatric:        Behavior: Behavior normal.        Thought Content: Thought content normal.        Judgment: Judgment normal.     Lab Results  Component Value Date   WBC 5.0 05/24/2023   HGB 12.8 05/24/2023   HCT 38.3 05/24/2023   PLT 279.0 05/24/2023   GLUCOSE 91 05/24/2023   CHOL 166 05/24/2023   TRIG 96.0 05/24/2023   HDL 55.00 05/24/2023   LDLCALC 91 05/24/2023   ALT 16 05/24/2023   AST 21 05/24/2023   NA 138 05/24/2023   K 4.6 05/24/2023   CL 104 05/24/2023   CREATININE 0.91 05/24/2023   BUN 19 05/24/2023   CO2 25 05/24/2023   TSH 5.33 05/24/2023   INR 0.90 08/05/2009   HGBA1C 5.8 05/24/2023    No results found.  Assessment & Plan:   Problem List Items Addressed This Visit     Anxiety disorder   Joe - her step father is at a NH ([facility]) 29 yo -- stress Using Lorazepam sometimes  Potential benefits of a long term benzodiazepines  use as well as potential risks  and complications were explained to the patient and were aknowledged.      HTN (hypertension)    On Enalapril 10 mg bid, Norvasc      Cervical radiculopathy - Primary   Chronic neck pain - planning to have cervical fusion C5-6 w/Dr Danielle Dess on 08/30/23. Tramadol and Robaxin prn Hold Meloicam  Potential benefits of a long term opioids use as well as potential risks (i.e. addiction risk, apnea etc) and complications (i.e. Somnolence, constipation and others) were explained to the patient and were aknowledged.       Relevant Medications   methocarbamol (ROBAXIN) 500 MG tablet   Dry skin dermatitis   Fingers: use triamcinolone and skin crack care by 35M          Meds ordered this encounter  Medications   methocarbamol (ROBAXIN) 500 MG tablet    Sig: Take 1 tablet (500 mg total) by mouth every 6 (six) hours as needed.    Dispense:  120 tablet    Refill:  3   traMADol (ULTRAM) 50 MG tablet    Sig: Take 1-2 tablets (50-100 mg total) by mouth 3 (three) times daily as needed for severe pain (pain score 7-10).    Dispense:  90 tablet    Refill:  1    Not to exceed 4 additional fills before 08/16/2023   triamcinolone cream (KENALOG) 0.5 %    Sig: Apply 1 Application topically 4 (four) times daily. On dry skin    Dispense:  90 g    Refill:  1      Follow-up: Return in about 3 months (around 11/21/2023) for a follow-up visit.  Sonda Primes, MD

## 2023-08-24 NOTE — Assessment & Plan Note (Addendum)
 Chronic neck pain - planning to have cervical fusion C5-6 w/Dr Danielle Dess on 08/30/23. Tramadol and Robaxin prn Hold Meloicam  Potential benefits of a long term opioids use as well as potential risks (i.e. addiction risk, apnea etc) and complications (i.e. Somnolence, constipation and others) were explained to the patient and were aknowledged.

## 2023-08-24 NOTE — Assessment & Plan Note (Signed)
 Fingers: use triamcinolone and skin crack care by 69M

## 2023-08-26 DIAGNOSIS — Z4689 Encounter for fitting and adjustment of other specified devices: Secondary | ICD-10-CM | POA: Diagnosis not present

## 2023-08-30 ENCOUNTER — Ambulatory Visit: Admit: 2023-08-30 | Payer: Medicare Other | Admitting: Neurological Surgery

## 2023-08-30 DIAGNOSIS — M50122 Cervical disc disorder at C5-C6 level with radiculopathy: Secondary | ICD-10-CM | POA: Diagnosis not present

## 2023-08-30 DIAGNOSIS — M5412 Radiculopathy, cervical region: Secondary | ICD-10-CM | POA: Diagnosis not present

## 2023-08-30 DIAGNOSIS — M50022 Cervical disc disorder at C5-C6 level with myelopathy: Secondary | ICD-10-CM | POA: Diagnosis not present

## 2023-08-30 SURGERY — ANTERIOR CERVICAL DECOMPRESSION/DISCECTOMY FUSION 1 LEVEL
Anesthesia: General

## 2023-09-07 DIAGNOSIS — Z960 Presence of urogenital implants: Secondary | ICD-10-CM | POA: Diagnosis not present

## 2023-09-15 DIAGNOSIS — M5412 Radiculopathy, cervical region: Secondary | ICD-10-CM | POA: Diagnosis not present

## 2023-09-28 ENCOUNTER — Ambulatory Visit (INDEPENDENT_AMBULATORY_CARE_PROVIDER_SITE_OTHER): Admitting: Family Medicine

## 2023-09-28 ENCOUNTER — Encounter: Payer: Self-pay | Admitting: Family Medicine

## 2023-09-28 VITALS — BP 148/55 | HR 76 | Ht 64.0 in | Wt 150.0 lb

## 2023-09-28 DIAGNOSIS — R234 Changes in skin texture: Secondary | ICD-10-CM

## 2023-09-28 MED ORDER — MUPIROCIN 2 % EX OINT
1.0000 | TOPICAL_OINTMENT | Freq: Two times a day (BID) | CUTANEOUS | 1 refills | Status: DC
Start: 2023-09-28 — End: 2024-01-19

## 2023-09-28 NOTE — Progress Notes (Signed)
 Acute Office Visit  Subjective:     Patient ID: Haley Armstrong, female    DOB: [birth year], 77 y.o.   MRN: [id number]  Chief Complaint  Patient presents with   Foot Problem    HPI Patient is in today for heel problem.  Discussed the use of AI scribe software for clinical note transcription with the patient, who gave verbal consent to proceed.  History of Present Illness Haley Armstrong is a 78 year old female presents with painful, cracked heels.   She experiences significant pain in her left heel, which is severe enough to wake her up at night. The heel was hot and red this morning, and was described as 'very feverish' around 6:45 AM. She uses antibiotic ointment and large bandages to cover the heel, with her husband's assistance. Despite these measures, the pain persists and is described as a 'throbbing kind of pain'. There is a split in the heel that is sore and has been present for some time. She has been using triamcinolone cream and Foot Miracle cream without improvement. No itching or drainage from the cracked areas, and no abscesses are felt. The affected areas do not feel feverish at the moment.  The right heel exhibits similar symptoms of cracking, but has not had the same level of redness or heat as the left heel. She has been using triamcinolone cream without noticeable improvement. She has not tried antifungal treatments for the dry, cracked areas but has some athlete's foot cream at home.      ROS All review of systems negative except what is listed in the HPI      Objective:    BP (!) 148/55   Pulse 76   Ht 5\' 4"  (1.626 m)   Wt 150 lb (68 kg)   SpO2 100%   BMI 25.75 kg/m    Physical Exam Vitals reviewed.  Constitutional:      Appearance: Normal appearance.  Skin:    Comments: See picture of heels - left with one open area, but no drainage; no warmth, inflammation, abscesses  Neurological:     Mental Status: She is alert and oriented to person,  place, and time.  Psychiatric:        Mood and Affect: Mood normal.        Behavior: Behavior normal.        Thought Content: Thought content normal.        Judgment: Judgment normal.       Right heel:    Left heel:     No results found for any visits on 09/28/23.      Assessment & Plan:   Problem List Items Addressed This Visit   None Visit Diagnoses       Cracked skin on feet    -  Primary   Relevant Medications   mupirocin ointment (BACTROBAN) 2 %     Try mupirocin ointment on the open area of left heel. Try the antifungal cream on the other cracked skin. Keep clean and well moisturized - caution regarding footwear that may contribute to irritation in your heels. Schedule a follow-up with your podiatrist to further evaluate.     Meds ordered this encounter  Medications   mupirocin ointment (BACTROBAN) 2 %    Sig: Apply 1 Application topically 2 (two) times daily.    Dispense:  22 g    Refill:  1    Supervising Provider:   Danise Edge A [[id number]]  Return if symptoms worsen or fail to improve, for / PCP follow-up for BP.  Clayborne Dana, NP

## 2023-09-28 NOTE — Patient Instructions (Signed)
 Try mupirocin ointment on the open area of left heel. Try the antifungal cream on the other cracked skin. Keep clean and well moisturized - caution regarding footwear that may contribute to irritation in your heels. Schedule a follow-up with your podiatrist to further evaluate.

## 2023-10-19 DIAGNOSIS — L821 Other seborrheic keratosis: Secondary | ICD-10-CM | POA: Diagnosis not present

## 2023-10-19 DIAGNOSIS — C4441 Basal cell carcinoma of skin of scalp and neck: Secondary | ICD-10-CM | POA: Diagnosis not present

## 2023-10-27 DIAGNOSIS — M5412 Radiculopathy, cervical region: Secondary | ICD-10-CM | POA: Diagnosis not present

## 2023-11-10 ENCOUNTER — Other Ambulatory Visit: Payer: Self-pay | Admitting: Internal Medicine

## 2023-11-11 ENCOUNTER — Encounter: Payer: Self-pay | Admitting: Internal Medicine

## 2023-11-19 ENCOUNTER — Encounter: Payer: Self-pay | Admitting: Internal Medicine

## 2023-11-22 ENCOUNTER — Other Ambulatory Visit: Payer: Self-pay | Admitting: Internal Medicine

## 2023-11-23 DIAGNOSIS — Z1231 Encounter for screening mammogram for malignant neoplasm of breast: Secondary | ICD-10-CM | POA: Diagnosis not present

## 2023-11-25 ENCOUNTER — Encounter: Payer: Self-pay | Admitting: Internal Medicine

## 2023-11-25 ENCOUNTER — Ambulatory Visit: Payer: Medicare Other | Admitting: Internal Medicine

## 2023-11-25 VITALS — BP 124/68 | HR 81 | Temp 97.7°F | Ht 64.0 in | Wt 154.0 lb

## 2023-11-25 DIAGNOSIS — I10 Essential (primary) hypertension: Secondary | ICD-10-CM | POA: Diagnosis not present

## 2023-11-25 DIAGNOSIS — M2550 Pain in unspecified joint: Secondary | ICD-10-CM

## 2023-11-25 DIAGNOSIS — M545 Low back pain, unspecified: Secondary | ICD-10-CM

## 2023-11-25 MED ORDER — PREGABALIN 150 MG PO CAPS: 150.0000 mg | ORAL_CAPSULE | Freq: Two times a day (BID) | ORAL | 3 refills | Status: DC

## 2023-11-25 MED ORDER — ENALAPRIL MALEATE 10 MG PO TABS: 20.0000 mg | ORAL_TABLET | Freq: Two times a day (BID) | ORAL | 3 refills | Status: DC

## 2023-11-25 MED ORDER — IPRATROPIUM BROMIDE 0.03 % NA SOLN: 2.0000 | Freq: Two times a day (BID) | NASAL | 2 refills | Status: AC

## 2023-11-25 MED ORDER — LORAZEPAM 0.5 MG PO TABS: ORAL_TABLET | ORAL | 3 refills | Status: DC

## 2023-11-25 NOTE — Assessment & Plan Note (Signed)
Tramadol prn - rare use ? Potential benefits of a long term opioids use as well as potential risks (i.e. addiction risk, apnea etc) and complications (i.e. Somnolence, constipation and others) were explained to the patient and were aknowledged. ?Blue-Emu cream was recommended to use 2-3 times a day ?

## 2023-11-25 NOTE — Assessment & Plan Note (Signed)
Blue-Emu cream was recommended to use 2-3 times a day ? ?

## 2023-11-25 NOTE — Assessment & Plan Note (Signed)
 New elevated BP at home SBP 158-178 Increase Enalapril  to 20 mg bid On Amlodipine  2.5 mg qd

## 2023-11-25 NOTE — Telephone Encounter (Signed)
 Pt has stated " When I got home I took my blood with thd little wrist cuff and it came up 164/69. Then I took it with the arm cuff and it came up 167/76. They ate both Omron.   I just wanted to let you know. I don't quite understand the numbers in your office my last two visits. I will probably take a 5mg  now.   Respectfully, Haley Armstrong"

## 2023-11-25 NOTE — Progress Notes (Signed)
 Subjective:  Patient ID: Haley Armstrong, female    DOB: 23-Jul-1945  Age: 78 y.o. MRN: [id number]  CC: Medical Management of Chronic Issues (3 mnth f/u)   HPI JAZIA FARACI presents for elevated BP at home SBP 158-178 C/o arthralgias, LBP  Outpatient Medications Prior to Visit  Medication Sig Dispense Refill   acetaminophen  (TYLENOL ) 325 MG tablet Take 2 tablets (650 mg total) by mouth every 6 (six) hours as needed for moderate pain. 30 tablet 0   albuterol  (VENTOLIN  HFA) 108 (90 Base) MCG/ACT inhaler Inhale 1-2 puffs into the lungs every 6 (six) hours as needed. 1 each 0   amLODipine  (NORVASC ) 2.5 MG tablet Take 1 tablet (2.5 mg total) by mouth daily. 90 tablet 3   Apoaequorin (PREVAGEN) 10 MG CAPS Take by mouth.     buPROPion  (WELLBUTRIN  XL) 150 MG 24 hr tablet TAKE 1 TABLET (150 MG TOTAL) BY MOUTH DAILY. TAKE IN AM 90 tablet 1   Cholecalciferol (VITAMIN D3) 50 MCG (2000 UT) capsule Take 1 capsule (2,000 Units total) by mouth daily. 100 capsule 3   diclofenac  sodium (VOLTAREN ) 1 % GEL Apply 4 g topically 4 (four) times daily. 100 Tube 11   hyoscyamine  (LEVBID) 0.375 MG 12 hr tablet TAKE 1 TABLET BY MOUTH 2 TIMES A DAY 60 tablet 1   hyoscyamine  (LEVSIN SL) 0.125 MG SL tablet DISSOLVE 1-2 TABLETS UNDER THE TONGUE EVERY FOUR HOURS AS NEEDED FOR PAIN 100 tablet 2   levothyroxine  (SYNTHROID ) 25 MCG tablet TAKE ONE TABLET BY MOUTH EVERY DAY BEFORE BREAKFAST 90 tablet 3   Lutein-Zeaxanthin 25-5 MG CAPS Take by mouth. Takes one daily     meclizine  (ANTIVERT ) 12.5 MG tablet Take 1-2 tablets (12.5-25 mg total) by mouth 3 (three) times daily as needed for dizziness. 30 tablet 0   meloxicam  (MOBIC ) 15 MG tablet TAKE 1 TABLET BY MOUTH EVERY DAY AFTER SURGERY AS NEEDED 90 tablet 0   methocarbamol  (ROBAXIN ) 500 MG tablet Take 1 tablet (500 mg total) by mouth every 6 (six) hours as needed. 120 tablet 3   Multiple Vitamins-Minerals (MULTIVITAMIN,TX-MINERALS) tablet Take 1 tablet by mouth daily.        mupirocin  ointment (BACTROBAN ) 2 % Apply 1 Application topically 2 (two) times daily. 22 g 1   nitrofurantoin , macrocrystal-monohydrate, (MACROBID ) 100 MG capsule Take 1 capsule (100 mg total) by mouth 2 (two) times daily. (Patient taking differently: Take 100 mg by mouth daily.) 14 capsule 0   NON FORMULARY Vitamin C, Osteo BiFlex, Vitamin B12 - PO daily     Omega 3 1000 MG CAPS Take 2 capsules by mouth daily.     ondansetron  (ZOFRAN -ODT) 4 MG disintegrating tablet Take 1 tablet (4 mg total) by mouth every 8 (eight) hours as needed for nausea or vomiting. 5 tablet 0   pantoprazole  (PROTONIX ) 40 MG tablet TAKE 1 TABLET BY MOUTH EVERY DAY 90 tablet 3   promethazine -dextromethorphan (PROMETHAZINE -DM) 6.25-15 MG/5ML syrup Take 2.5 mLs by mouth 3 (three) times daily as needed for cough. 100 mL 0   SUMAtriptan  (IMITREX ) 100 MG tablet Take 1 tablet (100 mg total) by mouth every 2 (two) hours as needed for migraine. May repeat in 2 hours if headache persists or recurs. 10 tablet 2   traMADol  (ULTRAM ) 50 MG tablet Take 1-2 tablets (50-100 mg total) by mouth 3 (three) times daily as needed for severe pain (pain score 7-10). 90 tablet 1   triamcinolone  cream (KENALOG ) 0.5 % Apply 1 Application topically  4 (four) times daily. On dry skin 90 g 1   enalapril  (VASOTEC ) 10 MG tablet TAKE 1 TABLET BY MOUTH TWICE A DAY 180 tablet 3   enalapril  (VASOTEC ) 5 MG tablet TAKE 1 TABLET BY MOUTH TWICE A DAY 180 tablet 0   ipratropium (ATROVENT ) 0.03 % nasal spray Place 2 sprays into both nostrils 2 (two) times daily. 30 mL 0   LORazepam  (ATIVAN ) 0.5 MG tablet TAKE 1 BY MOUTH EVERY NIGHT AT BEDTIME AS NEEDED FOR SLEEP 30 tablet 3   pregabalin  (LYRICA ) 150 MG capsule Take 150 mg by mouth 2 (two) times daily.     No facility-administered medications prior to visit.    ROS: Review of Systems  Constitutional:  Negative for activity change, appetite change, chills, fatigue and unexpected weight change.  HENT:  Negative  for congestion, mouth sores and sinus pressure.   Eyes:  Negative for visual disturbance.  Respiratory:  Negative for cough and chest tightness.   Gastrointestinal:  Negative for abdominal pain and nausea.  Genitourinary:  Negative for difficulty urinating, frequency and vaginal pain.  Musculoskeletal:  Positive for arthralgias and back pain. Negative for gait problem.  Skin:  Negative for pallor and rash.  Neurological:  Negative for dizziness, tremors, weakness, numbness and headaches.  Psychiatric/Behavioral:  Negative for confusion, sleep disturbance and suicidal ideas. The patient is nervous/anxious.     Objective:  BP 124/68   Pulse 81   Temp 97.7 F (36.5 C) (Oral)   Ht 5\' 4"  (1.626 m)   Wt 154 lb (69.9 kg)   SpO2 96%   BMI 26.43 kg/m   BP Readings from Last 3 Encounters:  11/25/23 124/68  09/28/23 (!) 148/55  08/24/23 130/60    Wt Readings from Last 3 Encounters:  11/25/23 154 lb (69.9 kg)  09/28/23 150 lb (68 kg)  08/24/23 149 lb (67.6 kg)    Physical Exam Constitutional:      General: She is not in acute distress.    Appearance: Normal appearance. She is well-developed.  HENT:     Head: Normocephalic.     Right Ear: External ear normal.     Left Ear: External ear normal.     Nose: Nose normal.  Eyes:     General:        Right eye: No discharge.        Left eye: No discharge.     Conjunctiva/sclera: Conjunctivae normal.     Pupils: Pupils are equal, round, and reactive to light.  Neck:     Thyroid : No thyromegaly.     Vascular: No JVD.     Trachea: No tracheal deviation.  Cardiovascular:     Rate and Rhythm: Normal rate and regular rhythm.     Heart sounds: Normal heart sounds.  Pulmonary:     Effort: No respiratory distress.     Breath sounds: No stridor. No wheezing.  Abdominal:     General: Bowel sounds are normal. There is no distension.     Palpations: Abdomen is soft. There is no mass.     Tenderness: There is no abdominal tenderness. There  is no guarding or rebound.  Musculoskeletal:        General: No tenderness.     Cervical back: Normal range of motion and neck supple. No rigidity.  Lymphadenopathy:     Cervical: No cervical adenopathy.  Skin:    Findings: No erythema or rash.  Neurological:     Mental Status: She is oriented  to person, place, and time.     Cranial Nerves: No cranial nerve deficit.     Motor: No abnormal muscle tone.     Coordination: Coordination normal.     Deep Tendon Reflexes: Reflexes normal.  Psychiatric:        Behavior: Behavior normal.        Thought Content: Thought content normal.        Judgment: Judgment normal.     Lab Results  Component Value Date   WBC 5.0 05/24/2023   HGB 12.8 05/24/2023   HCT 38.3 05/24/2023   PLT 279.0 05/24/2023   GLUCOSE 91 05/24/2023   CHOL 166 05/24/2023   TRIG 96.0 05/24/2023   HDL 55.00 05/24/2023   LDLCALC 91 05/24/2023   ALT 16 05/24/2023   AST 21 05/24/2023   NA 138 05/24/2023   K 4.6 05/24/2023   CL 104 05/24/2023   CREATININE 0.91 05/24/2023   BUN 19 05/24/2023   CO2 25 05/24/2023   TSH 5.33 05/24/2023   INR 0.90 08/05/2009   HGBA1C 5.8 05/24/2023    No results found.  Assessment & Plan:   Problem List Items Addressed This Visit     HTN (hypertension) - Primary    New elevated BP at home SBP 158-178 Increase Enalapril  to 20 mg bid On Amlodipine  2.5 mg qd      Relevant Medications   enalapril  (VASOTEC ) 10 MG tablet   LOW BACK PAIN   Blue-Emu cream was recommended to use 2-3 times a day       Arthralgia   Tramadol  prn - rare use  Potential benefits of a long term opioids use as well as potential risks (i.e. addiction risk, apnea etc) and complications (i.e. Somnolence, constipation and others) were explained to the patient and were aknowledged. Blue-Emu cream was recommended to use 2-3 times a day         Meds ordered this encounter  Medications   ipratropium (ATROVENT ) 0.03 % nasal spray    Sig: Place 2 sprays  into both nostrils 2 (two) times daily.    Dispense:  30 mL    Refill:  2   LORazepam  (ATIVAN ) 0.5 MG tablet    Sig: Take 1 by mouth every night at bedtime as needed for sleep    Dispense:  30 tablet    Refill:  3    Not to exceed 2 additional fills before 08/16/2023   pregabalin  (LYRICA ) 150 MG capsule    Sig: Take 1 capsule (150 mg total) by mouth 2 (two) times daily.    Dispense:  60 capsule    Refill:  3   enalapril  (VASOTEC ) 10 MG tablet    Sig: Take 2 tablets (20 mg total) by mouth 2 (two) times daily.    Dispense:  360 tablet    Refill:  3      Follow-up: Return in about 3 months (around 02/25/2024) for a follow-up visit.  Anitra Barn, MD

## 2023-12-04 ENCOUNTER — Other Ambulatory Visit: Payer: Self-pay | Admitting: Internal Medicine

## 2023-12-06 ENCOUNTER — Ambulatory Visit (INDEPENDENT_AMBULATORY_CARE_PROVIDER_SITE_OTHER): Payer: Medicare Other

## 2023-12-06 VITALS — BP 142/71 | Ht 64.0 in | Wt 154.0 lb

## 2023-12-06 DIAGNOSIS — Z0000 Encounter for general adult medical examination without abnormal findings: Secondary | ICD-10-CM | POA: Diagnosis not present

## 2023-12-06 NOTE — Progress Notes (Addendum)
 Subjective:   Haley Armstrong is a 78 y.o. who presents for a Medicare Wellness preventive visit.  As a reminder, Annual Wellness Visits don't include a physical exam, and some assessments may be limited, especially if this visit is performed virtually. We may recommend an in-person follow-up visit with your provider if needed.  Visit Complete: Virtual I connected with  ROBENA EWY on 12/06/23 by a audio enabled telemedicine application and verified that I am speaking with the correct person using two identifiers.  Patient Location: Home  Provider Location: Office/Clinic  I discussed the limitations of evaluation and management by telemedicine. The patient expressed understanding and agreed to proceed.  Vital Signs: Because this visit was a virtual/telehealth visit, some criteria may be missing or patient reported. Any vitals not documented were not able to be obtained and vitals that have been documented are patient reported.  VideoDeclined- This patient declined Librarian, academic. Therefore the visit was completed with audio only.  Persons Participating in Visit: Patient.  AWV Questionnaire: No: Patient Medicare AWV questionnaire was not completed prior to this visit.  Cardiac Risk Factors include: advanced age (>34men, >23 women);hypertension     Objective:     Today's Vitals   12/06/23 1056  BP: (!) 142/71  Weight: 154 lb (69.9 kg)  Height: 5\' 4"  (1.626 m)   Body mass index is 26.43 kg/m.     12/06/2023   10:54 AM 11/27/2022   11:39 AM 09/15/2021    4:52 PM 09/03/2020   10:59 AM 01/21/2020    1:29 PM 06/20/2019    1:22 PM  Advanced Directives  Does Patient Have a Medical Advance Directive? No No No No No No  Would patient like information on creating a medical advance directive? Yes (MAU/Ambulatory/Procedural Areas - Information given) No - Patient declined  No - Patient declined      Current Medications (verified) Outpatient  Encounter Medications as of 12/06/2023  Medication Sig   acetaminophen  (TYLENOL ) 325 MG tablet Take 2 tablets (650 mg total) by mouth every 6 (six) hours as needed for moderate pain.   albuterol  (VENTOLIN  HFA) 108 (90 Base) MCG/ACT inhaler Inhale 1-2 puffs into the lungs every 6 (six) hours as needed.   amLODipine  (NORVASC ) 2.5 MG tablet Take 1 tablet (2.5 mg total) by mouth daily.   Apoaequorin (PREVAGEN) 10 MG CAPS Take by mouth.   buPROPion  (WELLBUTRIN  XL) 150 MG 24 hr tablet TAKE 1 TABLET (150 MG TOTAL) BY MOUTH DAILY. TAKE IN AM   Cholecalciferol (VITAMIN D3) 50 MCG (2000 UT) capsule Take 1 capsule (2,000 Units total) by mouth daily.   diclofenac  sodium (VOLTAREN ) 1 % GEL Apply 4 g topically 4 (four) times daily.   enalapril  (VASOTEC ) 10 MG tablet Take 2 tablets (20 mg total) by mouth 2 (two) times daily.   hyoscyamine  (LEVBID) 0.375 MG 12 hr tablet TAKE 1 TABLET BY MOUTH 2 TIMES A DAY   hyoscyamine  (LEVSIN SL) 0.125 MG SL tablet DISSOLVE 1-2 TABLETS UNDER THE TONGUE EVERY FOUR HOURS AS NEEDED FOR PAIN   ipratropium (ATROVENT ) 0.03 % nasal spray Place 2 sprays into both nostrils 2 (two) times daily.   levothyroxine  (SYNTHROID ) 25 MCG tablet TAKE ONE TABLET BY MOUTH EVERY DAY BEFORE BREAKFAST   LORazepam  (ATIVAN ) 0.5 MG tablet Take 1 by mouth every night at bedtime as needed for sleep   Lutein-Zeaxanthin 25-5 MG CAPS Take by mouth. Takes one daily   meclizine  (ANTIVERT ) 12.5 MG tablet Take 1-2  tablets (12.5-25 mg total) by mouth 3 (three) times daily as needed for dizziness.   meloxicam  (MOBIC ) 15 MG tablet TAKE 1 TABLET BY MOUTH EVERY DAY AFTER SURGERY AS NEEDED   methocarbamol  (ROBAXIN ) 500 MG tablet Take 1 tablet (500 mg total) by mouth every 6 (six) hours as needed.   Multiple Vitamins-Minerals (MULTIVITAMIN,TX-MINERALS) tablet Take 1 tablet by mouth daily.     mupirocin  ointment (BACTROBAN ) 2 % Apply 1 Application topically 2 (two) times daily.   nitrofurantoin , macrocrystal-monohydrate,  (MACROBID ) 100 MG capsule Take 1 capsule (100 mg total) by mouth 2 (two) times daily. (Patient taking differently: Take 100 mg by mouth daily.)   NON FORMULARY Vitamin C, Osteo BiFlex, Vitamin B12 - PO daily   Omega 3 1000 MG CAPS Take 2 capsules by mouth daily.   ondansetron  (ZOFRAN -ODT) 4 MG disintegrating tablet Take 1 tablet (4 mg total) by mouth every 8 (eight) hours as needed for nausea or vomiting.   pantoprazole  (PROTONIX ) 40 MG tablet TAKE 1 TABLET BY MOUTH EVERY DAY   pregabalin  (LYRICA ) 150 MG capsule Take 1 capsule (150 mg total) by mouth 2 (two) times daily.   promethazine -dextromethorphan (PROMETHAZINE -DM) 6.25-15 MG/5ML syrup Take 2.5 mLs by mouth 3 (three) times daily as needed for cough.   SUMAtriptan  (IMITREX ) 100 MG tablet Take 1 tablet (100 mg total) by mouth every 2 (two) hours as needed for migraine. May repeat in 2 hours if headache persists or recurs.   traMADol  (ULTRAM ) 50 MG tablet Take 1-2 tablets (50-100 mg total) by mouth 3 (three) times daily as needed for severe pain (pain score 7-10).   triamcinolone  cream (KENALOG ) 0.5 % Apply 1 Application topically 4 (four) times daily. On dry skin   No facility-administered encounter medications on file as of 12/06/2023.    Allergies (verified) Patient has no known allergies.   History: Past Medical History:  Diagnosis Date   Anemia, iron deficiency    Anxiety    Blood donor    Family history of coronary artery disease 06/04/2016   GERD (gastroesophageal reflux disease)    Hypertension    IBS (irritable bowel syndrome)    Osteoarthritis    Presence of pessary    Past Surgical History:  Procedure Laterality Date   APPENDECTOMY     CERVICAL FUSION  06/29/2009   C4-5 Dr Bynum Cassis   CHOLECYSTECTOMY     ESOPHAGEAL MANOMETRY N/A 11/13/2013   Procedure: ESOPHAGEAL MANOMETRY (EM);  Surgeon: Tobin Forts, MD;  Location: [facility];  Service: Endoscopy;  Laterality: N/A;   TUBAL LIGATION     Family History  Problem  Relation Age of Onset   Diabetes Mother    Uterine cancer Mother 37       uterine sarcoma   Lymphoma Mother    Dementia Mother    Hypothyroidism Mother    Parkinsonism Father    Diabetes Brother    Heart disease Brother        CAD   Heart attack Brother    Diabetes Paternal Grandmother    Heart disease Maternal Aunt    Heart attack Maternal Aunt    Colon cancer Neg Hx    Esophageal cancer Neg Hx    Rectal cancer Neg Hx    Stomach cancer Neg Hx    Social History   Socioeconomic History   Marital status: Married    Spouse name: Not on file   Number of children: Not on file   Years of education: Not on file  Highest education level: Not on file  Occupational History   Occupation: Counsellor    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         Social Drivers of Corporate investment banker Strain: Low Risk  (12/06/2023)   Overall Financial Resource Strain (CARDIA)    Difficulty of Paying Living Expenses: Not hard at all  Food Insecurity: No Food Insecurity (12/06/2023)   Hunger Vital Sign    Worried About Running Out of Food in the Last Year: Never true    Ran Out of Food in the Last Year: Never true  Transportation Needs: No Transportation Needs (12/06/2023)   PRAPARE - Administrator, Civil Service (Medical): No    Lack of Transportation (Non-Medical): No  Physical Activity: Insufficiently Active (12/06/2023)   Exercise Vital Sign    Days of Exercise per Week: 5  days    Minutes of Exercise per Session: 20 min  Stress: No Stress Concern Present (12/06/2023)   [employer] of Occupational Health - Occupational Stress Questionnaire    Feeling of Stress : Only a little  Social Connections: Socially Integrated (12/06/2023)   Social Connection and Isolation Panel [NHANES]    Frequency of Communication with Friends and Family: More than three times a week    Frequency of Social Gatherings with Friends and Family: More than three times a week    Attends Religious Services: More than 4 times per year    Active Member of [employer] or Organizations: Yes    Attends Engineer, structural: More than 4 times per year    Marital Status: Married    Tobacco Counseling Counseling given: No    Clinical Intake:  Pre-visit preparation completed: Yes  Pain : No/denies pain     BMI - recorded: 26.43 Nutritional Status: BMI 25 -29 Overweight Nutritional Risks: None Diabetes: No  Lab Results  Component Value Date   HGBA1C 5.8 05/24/2023   HGBA1C 5.8 05/08/2016   HGBA1C 5.7 05/20/2007     How often do you need to have someone help you when you read instructions, pamphlets, or other written materials from your doctor or pharmacy?: 1 - Never  Interpreter Needed?: No  Information entered by :: Kandy Orris, CMA   Activities of Daily Living     12/06/2023   10:59 AM  In your present state of health, do you have any difficulty performing the following activities:  Hearing? 0  Vision? 0  Difficulty concentrating or making decisions? 0  Walking or climbing stairs? 0  Dressing or bathing? 0  Doing errands, shopping? 0  Preparing Food and eating ? N  Using the Toilet? N  In the past six months, have you accidently leaked urine? N  Do you have problems with loss of bowel control? N  Managing your Medications? N  Managing your Finances? N  Housekeeping or managing your Housekeeping? N    Patient Care Team: [name], Oakley Bellman, MD as PCP -  General Stafford Eagles, MD as Attending Physician (Family Medicine) Manya Sells, MD as Consulting Physician (Neurosurgery) Jacqueline Matsu, MD as Consulting Physician (Cardiology) Phineas Breech, OD as Consulting Physician (Optometry) Elna Haggis, MD as Consulting Physician (Neurosurgery) Clarisa Crooked, MD as Consulting Physician (Obstetrics and Gynecology)  I have updated your Care Teams any recent Medical Services you may have received from other providers in the past year.     Assessment:    This is a routine wellness examination for [place].  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 0 1 0 0 0  Number falls in past yr: 0 1 0 0 0  Injury with Fall? 0 0 0 0 0  Risk for fall due to : No Fall Risks History of fall(s) No Fall Risks No Fall Risks   Follow up Falls evaluation completed;Falls prevention discussed Falls evaluation completed Falls evaluation completed Falls evaluation completed Falls evaluation completed    MEDICARE RISK AT HOME:  Medicare Risk at Home Any stairs in or around the home?: No If so, are there any without handrails?: No Home free of loose throw rugs in walkways, pet beds, electrical cords, etc?: Yes Adequate  lighting in your home to reduce risk of falls?: Yes Life alert?: No Use of a cane, walker or w/c?: No Grab bars in the bathroom?: Yes Shower chair or bench in shower?: No Elevated toilet seat or a handicapped toilet?: No  TIMED UP AND GO:  Was the test performed?  No  Cognitive Function: 6CIT completed    03/05/2016    2:02 PM  MMSE - Mini Mental State Exam  Not completed: --        12/06/2023   11:04 AM 11/27/2022   11:39 AM  6CIT Screen  What Year? 0 points 0 points  What month? 0 points 0 points  What time? 0 points 0 points  Count back from 20 0 points 0 points  Months in reverse 0 points 0 points  Repeat phrase 0 points 0 points  Total Score 0 points 0 points    Immunizations Immunization History  Administered Date(s) Administered   Fluad Quad(high Dose 65+) 04/13/2019, 04/26/2020, 05/03/2021, 04/22/2022   Fluad Trivalent(High Dose 65+) 04/20/2023   Influenza Split 03/16/2011   Influenza Whole 05/24/2007,  03/21/2008, 05/29/2009, 05/06/2010   Influenza, High Dose Seasonal PF 04/26/2017   Influenza,inj,Quad PF,6+ Mos 03/25/2013, 03/21/2014, 04/18/2015, 03/05/2016   Influenza,inj,quad, With Preservative 04/15/2019   Influenza-Unspecified 04/15/2019   PFIZER Comirnaty(Gray Top)Covid-19 Tri-Sucrose Vaccine 01/16/2021   PFIZER(Purple Top)SARS-COV-2 Vaccination 08/12/2019, 09/06/2019, 05/31/2020   Pneumococcal Conjugate-13 04/18/2015   Pneumococcal Polysaccharide-23 06/07/2013   Respiratory Syncytial Virus Vaccine,Recomb Aduvanted(Arexvy) 06/17/2022   Td 04/02/2004   Tdap 03/25/2013   Zoster Recombinant(Shingrix) 11/09/2017, 01/14/2018   Zoster, Live 11/09/2007    Screening Tests Health Maintenance  Topic Date Due   COVID-19 Vaccine (5 - 2024-25 season) 02/28/2023   DTaP/Tdap/Td (3 - Td or Tdap) 03/26/2023   INFLUENZA VACCINE  01/28/2024   Medicare Annual Wellness (AWV)  12/05/2024   Pneumonia Vaccine 37+ Years old  Completed   DEXA SCAN  Completed   Hepatitis  C Screening  Completed   Zoster Vaccines- Shingrix  Completed   HPV VACCINES  Aged Out   Meningococcal B Vaccine  Aged Out   Colonoscopy  Discontinued    Health Maintenance  Health Maintenance Due  Topic Date Due   COVID-19 Vaccine (5 - 2024-25 season) 02/28/2023   DTaP/Tdap/Td (3 - Td or Tdap) 03/26/2023   Health Maintenance Items Addressed:  Pt stated had a Mammogram w/Dr Rogene Claude (Gyn) in 2/025.  Will request report.  Additional Screening:  Vision Screening: Recommended annual ophthalmology exams for early detection of glaucoma and other disorders of the eye. Would you like a referral to an eye doctor? No    Dental Screening: Recommended annual dental exams for proper oral hygiene  Community Resource Referral / Chronic Care Management: CRR required this visit?  No   CCM required this visit?  No   Plan:    I have personally reviewed and noted the following in the patient's chart:   Medical and social history Use of alcohol, tobacco or illicit drugs  Current medications and supplements including opioid prescriptions. Patient is currently taking opioid prescriptions. Information provided to patient regarding non-opioid alternatives. Patient advised to discuss non-opioid treatment plan with their provider. Functional ability and status Nutritional status Physical activity Advanced directives List of other physicians Hospitalizations, surgeries, and ER visits in previous 12 months Vitals Screenings to include cognitive, depression, and falls Referrals and appointments  In addition, I have reviewed and discussed with patient certain preventive protocols, quality metrics, and best practice recommendations. A written personalized care plan for preventive services as well as general preventive health recommendations were provided to patient.   Patria Bookbinder, CMA   12/06/2023   After Visit Summary: (MyChart) Due to this being a telephonic visit, the after visit  summary with patients personalized plan was offered to patient via MyChart   Notes: Nothing significant to report at this time.

## 2023-12-06 NOTE — Patient Instructions (Signed)
 Ms. Haley Armstrong , Thank you for taking time out of your busy schedule to complete your Annual Wellness Visit with me. I enjoyed our conversation and look forward to speaking with you again next year. I, as well as your care team,  appreciate your ongoing commitment to your health goals. Please review the following plan we discussed and let me know if I can assist you in the future. Your Game plan/ To Do List    Follow up Visits: Next Medicare AWV with our clinical staff: 12/08/2024   Have you seen your provider in the last 6 months (3 months if uncontrolled diabetes)? Yes Next Office Visit with your provider: 02/29/2024  Clinician Recommendations:  Aim for 30 minutes of exercise or brisk walking, 6-8 glasses of water, and 5 servings of fruits and vegetables each day. Educated and advised on getting a Tdap (Tetenus) vaccine at [facility] in 2025.      This is a list of the screening recommended for you and due dates:  Health Maintenance  Topic Date Due   COVID-19 Vaccine (5 - 2024-25 season) 02/28/2023   DTaP/Tdap/Td vaccine (3 - Td or Tdap) 03/26/2023   Flu Shot  01/28/2024   Medicare Annual Wellness Visit  12/05/2024   Pneumonia Vaccine  Completed   DEXA scan (bone density measurement)  Completed   Hepatitis C Screening  Completed   Zoster (Shingles) Vaccine  Completed   HPV Vaccine  Aged Out   Meningitis B Vaccine  Aged Out   Colon Cancer Screening  Discontinued    Advanced directives: (Provided) Advance directive discussed with you today. I have provided a copy for you to complete at home and have notarized. Once this is complete, please bring a copy in to our office so we can scan it into your chart.  Advance Care Planning is important because it:  [x]  Makes sure you receive the medical care that is consistent with your values, goals, and preferences  [x]  It provides guidance to your family and loved ones and reduces their decisional burden about whether or not they are making the  right decisions based on your wishes.  Follow the link provided in your after visit summary or read over the paperwork we have mailed to you to help you started getting your Advance Directives in place. If you need assistance in completing these, please reach out to us  so that we can help you!

## 2023-12-08 DIAGNOSIS — M25561 Pain in right knee: Secondary | ICD-10-CM | POA: Insufficient documentation

## 2023-12-08 DIAGNOSIS — M170 Bilateral primary osteoarthritis of knee: Secondary | ICD-10-CM | POA: Diagnosis not present

## 2023-12-14 DIAGNOSIS — Z85828 Personal history of other malignant neoplasm of skin: Secondary | ICD-10-CM | POA: Diagnosis not present

## 2023-12-14 DIAGNOSIS — Z08 Encounter for follow-up examination after completed treatment for malignant neoplasm: Secondary | ICD-10-CM | POA: Diagnosis not present

## 2023-12-29 ENCOUNTER — Other Ambulatory Visit: Payer: Self-pay | Admitting: Internal Medicine

## 2023-12-30 DIAGNOSIS — M25561 Pain in right knee: Secondary | ICD-10-CM | POA: Diagnosis not present

## 2024-01-06 DIAGNOSIS — M1711 Unilateral primary osteoarthritis, right knee: Secondary | ICD-10-CM | POA: Diagnosis not present

## 2024-01-19 ENCOUNTER — Encounter: Payer: Self-pay | Admitting: Podiatry

## 2024-01-19 ENCOUNTER — Ambulatory Visit: Admitting: Podiatry

## 2024-01-19 DIAGNOSIS — M659 Unspecified synovitis and tenosynovitis, unspecified site: Secondary | ICD-10-CM

## 2024-01-19 DIAGNOSIS — M65072 Abscess of tendon sheath, left ankle and foot: Secondary | ICD-10-CM | POA: Diagnosis not present

## 2024-01-19 DIAGNOSIS — M7752 Other enthesopathy of left foot: Secondary | ICD-10-CM | POA: Diagnosis not present

## 2024-01-19 MED ORDER — TRIAMCINOLONE ACETONIDE 10 MG/ML IJ SUSP
10.0000 mg | Freq: Once | INTRAMUSCULAR | Status: AC
Start: 2024-01-19 — End: 2024-01-19
  Administered 2024-01-19: 10 mg via INTRA_ARTICULAR

## 2024-01-19 NOTE — Progress Notes (Signed)
 Subjective:   Patient ID: Haley Armstrong, female   DOB: 78 y.o.   MRN: [id number]   HPI Patient states the big toe joint left has become sore and she needs it worked on as she is going out of town neuro   ROS      Objective:  Physical Exam  Vascular status intact inflammation around the first MPJ left fluid buildup around the joint moderate pain     Assessment:  Inflammatory synovitis first MPJ left with pain     Plan:  H&P done sterile prep injected periarticular into the synovium around the joint 3 mg Dexasone Kenalog  5 mg Xylocaine  advised on rigid bottom shoes reappoint to recheck

## 2024-01-25 ENCOUNTER — Telehealth: Payer: Self-pay | Admitting: Internal Medicine

## 2024-01-25 DIAGNOSIS — Z01818 Encounter for other preprocedural examination: Secondary | ICD-10-CM

## 2024-01-25 DIAGNOSIS — Z87440 Personal history of urinary (tract) infections: Secondary | ICD-10-CM

## 2024-01-25 NOTE — Telephone Encounter (Signed)
 Copied from CRM [id number]. Topic: Medical Record Request - Other >> Jan 25, 2024  9:05 AM Jasmin G wrote: Reason for CRM: Mrs.Megan from [facility] called due to needing updated lab work and EKG for pt's upcoming surgery on Sep 15th, she just faxed over details about it and needs notes faxed back at your earliest convenience

## 2024-01-28 ENCOUNTER — Telehealth: Payer: Self-pay

## 2024-01-28 NOTE — Telephone Encounter (Signed)
 Pharmacy states a new rx for enalapril  (VASOTEC ) 20 MG tablet be sent in rather than the current 10mg  as pts insurance will cover the 20mg  two times a day.

## 2024-01-28 NOTE — Telephone Encounter (Signed)
 I was able to speak with the pt and she has stated she will be in to do her lab work and have her EKG done at her apptmnt on 02/29/2024.

## 2024-01-31 MED ORDER — ENALAPRIL MALEATE 20 MG PO TABS: 20.0000 mg | ORAL_TABLET | Freq: Two times a day (BID) | ORAL | 3 refills | Status: DC

## 2024-01-31 NOTE — Addendum Note (Signed)
 Addended by: Holley Wirt V on: 01/31/2024 07:35 AM   Modules accepted: Orders

## 2024-01-31 NOTE — Telephone Encounter (Signed)
 Okay.  Thanks.

## 2024-02-06 ENCOUNTER — Other Ambulatory Visit: Payer: Self-pay | Admitting: Internal Medicine

## 2024-02-07 ENCOUNTER — Other Ambulatory Visit (INDEPENDENT_AMBULATORY_CARE_PROVIDER_SITE_OTHER)

## 2024-02-07 DIAGNOSIS — Z87440 Personal history of urinary (tract) infections: Secondary | ICD-10-CM

## 2024-02-07 DIAGNOSIS — Z01818 Encounter for other preprocedural examination: Secondary | ICD-10-CM

## 2024-02-07 LAB — URINALYSIS, ROUTINE W REFLEX MICROSCOPIC
Bilirubin Urine: NEGATIVE
Hgb urine dipstick: NEGATIVE
Ketones, ur: NEGATIVE
Leukocytes,Ua: NEGATIVE
Nitrite: NEGATIVE
RBC / HPF: NONE SEEN (ref 0–?)
Specific Gravity, Urine: 1.01 (ref 1.000–1.030)
Total Protein, Urine: NEGATIVE
Urine Glucose: NEGATIVE
Urobilinogen, UA: 0.2 (ref 0.0–1.0)
WBC, UA: NONE SEEN (ref 0–?)
pH: 6 (ref 5.0–8.0)

## 2024-02-07 LAB — LIPID PANEL
Cholesterol: 188 mg/dL (ref 0–200)
HDL: 69.4 mg/dL (ref >39.00)
LDL Cholesterol: 102 mg/dL — ABNORMAL HIGH (ref 0–99)
NonHDL: 118.64
Total CHOL/HDL Ratio: 3
Triglycerides: 85 mg/dL (ref 0.0–149.0)
VLDL: 17 mg/dL (ref 0.0–40.0)

## 2024-02-07 LAB — CBC WITH DIFFERENTIAL/PLATELET
Basophils Absolute: 0.1 K/uL (ref 0.0–0.1)
Basophils Relative: 1.2 % (ref 0.0–3.0)
Eosinophils Absolute: 0.2 K/uL (ref 0.0–0.7)
Eosinophils Relative: 3.5 % (ref 0.0–5.0)
HCT: 40.3 % (ref 36.0–46.0)
Hemoglobin: 13.4 g/dL (ref 12.0–15.0)
Lymphocytes Relative: 31.5 % (ref 12.0–46.0)
Lymphs Abs: 1.8 K/uL (ref 0.7–4.0)
MCHC: 33.1 g/dL (ref 30.0–36.0)
MCV: 95.2 fl (ref 78.0–100.0)
Monocytes Absolute: 0.6 K/uL (ref 0.1–1.0)
Monocytes Relative: 10.9 % (ref 3.0–12.0)
Neutro Abs: 3.1 K/uL (ref 1.4–7.7)
Neutrophils Relative %: 52.9 % (ref 43.0–77.0)
Platelets: 308 K/uL (ref 150.0–400.0)
RBC: 4.24 Mil/uL (ref 3.87–5.11)
RDW: 13.7 % (ref 11.5–15.5)
WBC: 5.8 K/uL (ref 4.0–10.5)

## 2024-02-07 LAB — COMPREHENSIVE METABOLIC PANEL WITH GFR
ALT: 16 U/L (ref 0–35)
AST: 20 U/L (ref 0–37)
Albumin: 4.5 g/dL (ref 3.5–5.2)
Alkaline Phosphatase: 61 U/L (ref 39–117)
BUN: 18 mg/dL (ref 6–23)
CO2: 30 meq/L (ref 19–32)
Calcium: 9.6 mg/dL (ref 8.4–10.5)
Chloride: 100 meq/L (ref 96–112)
Creatinine, Ser: 1.05 mg/dL (ref 0.40–1.20)
GFR: 51.36 mL/min — ABNORMAL LOW (ref >60.00)
Glucose, Bld: 91 mg/dL (ref 70–99)
Potassium: 5.7 meq/L — ABNORMAL HIGH (ref 3.5–5.1)
Sodium: 137 meq/L (ref 135–145)
Total Bilirubin: 0.5 mg/dL (ref 0.2–1.2)
Total Protein: 6.9 g/dL (ref 6.0–8.3)

## 2024-02-07 LAB — TSH: TSH: 6.75 u[IU]/mL — ABNORMAL HIGH (ref 0.35–5.50)

## 2024-02-08 LAB — URINE CULTURE
MICRO NUMBER:: 16813144
Result:: NO GROWTH
SPECIMEN QUALITY:: ADEQUATE

## 2024-02-09 ENCOUNTER — Other Ambulatory Visit: Payer: Self-pay | Admitting: Internal Medicine

## 2024-02-09 ENCOUNTER — Ambulatory Visit: Payer: Self-pay | Admitting: Internal Medicine

## 2024-02-09 DIAGNOSIS — F413 Other mixed anxiety disorders: Secondary | ICD-10-CM

## 2024-02-20 ENCOUNTER — Other Ambulatory Visit: Payer: Self-pay | Admitting: Internal Medicine

## 2024-02-21 ENCOUNTER — Other Ambulatory Visit: Payer: Self-pay | Admitting: Internal Medicine

## 2024-02-21 ENCOUNTER — Telehealth: Payer: Self-pay

## 2024-02-21 NOTE — Telephone Encounter (Signed)
 Copied from CRM [id number]. Topic: General - Other >> Feb 18, 2024  3:21 PM Abigail D wrote: Reason for CRM: Megan with [facility] is calling regarding a clearance letter - They have received it but they do need the date of the patients most recent office visit along with the visit notes & most recent labs. She said this request was on the clearance letter but she can refax if needed.  CB #: [phone] Fax; [phone]

## 2024-02-22 NOTE — Telephone Encounter (Signed)
Documents have been faxed as requested.

## 2024-02-29 ENCOUNTER — Ambulatory Visit: Admitting: Internal Medicine

## 2024-02-29 ENCOUNTER — Encounter: Payer: Self-pay | Admitting: Internal Medicine

## 2024-02-29 VITALS — BP 130/58 | HR 63 | Temp 97.9°F | Ht 64.0 in | Wt 154.4 lb

## 2024-02-29 DIAGNOSIS — Z01818 Encounter for other preprocedural examination: Secondary | ICD-10-CM | POA: Diagnosis not present

## 2024-02-29 DIAGNOSIS — M545 Low back pain, unspecified: Secondary | ICD-10-CM | POA: Diagnosis not present

## 2024-02-29 DIAGNOSIS — M170 Bilateral primary osteoarthritis of knee: Secondary | ICD-10-CM

## 2024-02-29 DIAGNOSIS — F413 Other mixed anxiety disorders: Secondary | ICD-10-CM | POA: Diagnosis not present

## 2024-02-29 LAB — TSH: TSH: 4.16 u[IU]/mL (ref 0.35–5.50)

## 2024-02-29 LAB — T4, FREE: Free T4: 0.89 ng/dL (ref 0.60–1.60)

## 2024-02-29 LAB — T3, FREE: T3, Free: 2.5 pg/mL (ref 2.3–4.2)

## 2024-02-29 NOTE — Assessment & Plan Note (Addendum)
 Having knee replacement on 03/13/2024 - Dr Kay: Haley Armstrong is medically clear for surgery Procedure: EKG Indication: Pre-op; no chest pain Impression: NSR w/PVC. No acute changes. Labs:elevated TSH - need to check FT3, FT4

## 2024-02-29 NOTE — Assessment & Plan Note (Signed)
 Periop DVT prophylaxis

## 2024-02-29 NOTE — Assessment & Plan Note (Signed)
 R>L Having knee replacement on 03/13/2024 - Dr Kay EKG Labs  Tramadol  prn  Potential benefits of a long term opioids use as well as potential risks (i.e. addiction risk, apnea etc) and complications (i.e. Somnolence, constipation and others) were explained to the patient and were aknowledged.

## 2024-02-29 NOTE — Assessment & Plan Note (Addendum)
 Voltaren  cream was recommended to use 2-3 times a day   Tramadol  prn, Methocarbamol  prn  Potential benefits of a short/long term opioids use as well as potential risks (i.e. addiction risk, apnea etc) and complications (i.e. Somnolence, constipation and others) were explained to the patient and were aknowledged.

## 2024-02-29 NOTE — Assessment & Plan Note (Signed)
 Joe - her step father is at a NH ([facility]) 78 yo -- stress Using Lorazepam sometimes  Potential benefits of a long term benzodiazepines  use as well as potential risks  and complications were explained to the patient and were aknowledged.

## 2024-02-29 NOTE — Progress Notes (Signed)
 Subjective:  Patient ID: Haley Armstrong, female    DOB: 05-Apr-1946  Age: 78 y.o. MRN: [id number]  CC: Follow-up (Having knee replacement on 03/13/2024)   HPI Haley Armstrong presents for knee OA, HTN, hypothyroidism  Follow-up (Having knee replacement on 03/13/2024 - Dr Kay)  Outpatient Medications Prior to Visit  Medication Sig Dispense Refill   acetaminophen  (TYLENOL ) 325 MG tablet Take 2 tablets (650 mg total) by mouth every 6 (six) hours as needed for moderate pain. 30 tablet 0   amLODipine  (NORVASC ) 2.5 MG tablet TAKE 1 TABLET BY MOUTH EVERY DAY 90 tablet 3   Apoaequorin (PREVAGEN) 10 MG CAPS Take by mouth.     buPROPion  (WELLBUTRIN  XL) 150 MG 24 hr tablet TAKE 1 TABLET (150 MG TOTAL) BY MOUTH DAILY. TAKE IN AM 90 tablet 1   Cholecalciferol (VITAMIN D3) 50 MCG (2000 UT) capsule Take 1 capsule (2,000 Units total) by mouth daily. 100 capsule 3   diclofenac  sodium (VOLTAREN ) 1 % GEL Apply 4 g topically 4 (four) times daily. 100 Tube 11   enalapril  (VASOTEC ) 20 MG tablet Take 1 tablet (20 mg total) by mouth 2 (two) times daily. 180 tablet 3   hyoscyamine  (LEVSIN  SL) 0.125 MG SL tablet DISSOLVE 1-2 TABLETS UNDER THE TONGUE EVERY FOUR HOURS AS NEEDED FOR PAIN 100 tablet 2   ipratropium (ATROVENT ) 0.03 % nasal spray Place 2 sprays into both nostrils 2 (two) times daily. 30 mL 2   levothyroxine  (SYNTHROID ) 25 MCG tablet TAKE ONE TABLET BY MOUTH EVERY DAY BEFORE BREAKFAST 90 tablet 3   LORazepam  (ATIVAN ) 0.5 MG tablet TAKE 1 TABLET BY MOUTH EVERY NIGHT AT BEDTIME AS NEEDED FOR SLEEP 30 tablet 3   Lutein-Zeaxanthin 25-5 MG CAPS Take by mouth. Takes one daily     meclizine  (ANTIVERT ) 12.5 MG tablet Take 1-2 tablets (12.5-25 mg total) by mouth 3 (three) times daily as needed for dizziness. 30 tablet 0   meloxicam  (MOBIC ) 15 MG tablet TAKE 1 TABLET BY MOUTH EVERY DAY AFTER SURGERY AS NEEDED 90 tablet 0   methocarbamol  (ROBAXIN ) 500 MG tablet Take 1 tablet (500 mg total) by mouth every 6  (six) hours as needed. 120 tablet 3   Multiple Vitamins-Minerals (MULTIVITAMIN,TX-MINERALS) tablet Take 1 tablet by mouth daily.       NON FORMULARY Vitamin C, Osteo BiFlex, Vitamin B12 - PO daily     Omega 3 1000 MG CAPS Take 2 capsules by mouth daily.     ondansetron  (ZOFRAN -ODT) 4 MG disintegrating tablet Take 1 tablet (4 mg total) by mouth every 8 (eight) hours as needed for nausea or vomiting. 5 tablet 0   pantoprazole  (PROTONIX ) 40 MG tablet TAKE 1 TABLET BY MOUTH EVERY DAY 90 tablet 3   pregabalin  (LYRICA ) 150 MG capsule Take 1 capsule (150 mg total) by mouth 2 (two) times daily. 60 capsule 3   promethazine -dextromethorphan (PROMETHAZINE -DM) 6.25-15 MG/5ML syrup Take 2.5 mLs by mouth 3 (three) times daily as needed for cough. 100 mL 0   SUMAtriptan  (IMITREX ) 100 MG tablet Take 1 tablet (100 mg total) by mouth every 2 (two) hours as needed for migraine. May repeat in 2 hours if headache persists or recurs. 10 tablet 2   traMADol  (ULTRAM ) 50 MG tablet Take 1-2 tablets (50-100 mg total) by mouth 3 (three) times daily as needed for severe pain (pain score 7-10). 90 tablet 1   triamcinolone  cream (KENALOG ) 0.5 % Apply 1 Application topically 4 (four) times daily. On dry skin  90 g 1   No facility-administered medications prior to visit.    ROS: Review of Systems  Constitutional:  Positive for fatigue. Negative for activity change, appetite change, chills and unexpected weight change.  HENT:  Negative for congestion, mouth sores and sinus pressure.   Eyes:  Negative for visual disturbance.  Respiratory:  Negative for cough and chest tightness.   Cardiovascular:  Negative for leg swelling.  Gastrointestinal:  Negative for abdominal pain and nausea.  Genitourinary:  Negative for difficulty urinating, frequency and vaginal pain.  Musculoskeletal:  Positive for arthralgias, back pain and gait problem.  Skin:  Negative for pallor and rash.  Neurological:  Negative for dizziness, tremors,  weakness, numbness and headaches.  Psychiatric/Behavioral:  Positive for decreased concentration. Negative for confusion, sleep disturbance and suicidal ideas. The patient is nervous/anxious.     Objective:  BP (!) 130/58   Pulse 63   Temp 97.9 F (36.6 C)   Ht 5' 4 (1.626 m)   Wt 154 lb 6.4 oz (70 kg)   SpO2 95%   BMI 26.50 kg/m   BP Readings from Last 3 Encounters:  02/29/24 (!) 130/58  12/06/23 (!) 142/71  11/25/23 124/68    Wt Readings from Last 3 Encounters:  02/29/24 154 lb 6.4 oz (70 kg)  12/06/23 154 lb (69.9 kg)  11/25/23 154 lb (69.9 kg)    Physical Exam Constitutional:      General: She is not in acute distress.    Appearance: She is well-developed.  HENT:     Head: Normocephalic.     Right Ear: External ear normal.     Left Ear: External ear normal.     Nose: Nose normal.  Eyes:     General:        Right eye: No discharge.        Left eye: No discharge.     Conjunctiva/sclera: Conjunctivae normal.     Pupils: Pupils are equal, round, and reactive to light.  Neck:     Thyroid : No thyromegaly.     Vascular: No JVD.     Trachea: No tracheal deviation.  Cardiovascular:     Rate and Rhythm: Normal rate and regular rhythm.     Heart sounds: Normal heart sounds.  Pulmonary:     Effort: No respiratory distress.     Breath sounds: No stridor. No wheezing.  Abdominal:     General: Bowel sounds are normal. There is no distension.     Palpations: Abdomen is soft. There is no mass.     Tenderness: There is no abdominal tenderness. There is no guarding or rebound.  Musculoskeletal:        General: Tenderness present.     Cervical back: Normal range of motion and neck supple. No rigidity.     Right lower leg: No edema.     Left lower leg: No edema.  Lymphadenopathy:     Cervical: No cervical adenopathy.  Skin:    Findings: No erythema or rash.  Neurological:     Mental Status: She is oriented to person, place, and time.     Cranial Nerves: No cranial  nerve deficit.     Motor: No abnormal muscle tone.     Coordination: Coordination normal.     Gait: Gait abnormal.     Deep Tendon Reflexes: Reflexes normal.  Psychiatric:        Behavior: Behavior normal.        Thought Content: Thought content normal.  Judgment: Judgment normal.    R knee w/pain Limping  Procedure: EKG Indication: Pre-op; no chest pain Impression: NSR w/PVC. No acute changes.   Lab Results  Component Value Date   WBC 5.8 02/07/2024   HGB 13.4 02/07/2024   HCT 40.3 02/07/2024   PLT 308.0 02/07/2024   GLUCOSE 91 02/07/2024   CHOL 188 02/07/2024   TRIG 85.0 02/07/2024   HDL 69.40 02/07/2024   LDLCALC 102 (H) 02/07/2024   ALT 16 02/07/2024   AST 20 02/07/2024   NA 137 02/07/2024   K 5.7 No hemolysis seen (H) 02/07/2024   CL 100 02/07/2024   CREATININE 1.05 02/07/2024   BUN 18 02/07/2024   CO2 30 02/07/2024   TSH 6.75 (H) 02/07/2024   INR 0.90 08/05/2009   HGBA1C 5.8 05/24/2023    No results found.  Assessment & Plan:   Problem List Items Addressed This Visit     Anxiety disorder - Primary   Joe - her step father is at a NH ([facility]) 63 yo -- stress Using Lorazepam  sometimes  Potential benefits of a long term benzodiazepines  use as well as potential risks  and complications were explained to the patient and were aknowledged.      Knee osteoarthritis   R>L Having knee replacement on 03/13/2024 - Dr Kay EKG Labs  Tramadol  prn  Potential benefits of a long term opioids use as well as potential risks (i.e. addiction risk, apnea etc) and complications (i.e. Somnolence, constipation and others) were explained to the patient and were aknowledged.      LOW BACK PAIN   Voltaren  cream was recommended to use 2-3 times a day   Tramadol  prn, Methocarbamol  prn  Potential benefits of a short/long term opioids use as well as potential risks (i.e. addiction risk, apnea etc) and complications (i.e. Somnolence, constipation and others) were  explained to the patient and were aknowledged.      Preop exam for internal medicine   Having knee replacement on 03/13/2024 - Dr Kay: Haley Armstrong is medically clear for surgery Procedure: EKG Indication: Pre-op; no chest pain Impression: NSR w/PVC. No acute changes. Labs:elevated TSH - need to check FT3, FT4         No orders of the defined types were placed in this encounter.     Follow-up: Return in about 3 months (around 05/30/2024) for a follow-up visit.  Marolyn Noel, MD

## 2024-03-01 ENCOUNTER — Other Ambulatory Visit: Payer: Self-pay | Admitting: Internal Medicine

## 2024-03-01 NOTE — Addendum Note (Signed)
 Addended byBETHA LUCETTA CLEATRICE LELON on: 03/01/2024 08:46 AM   Modules accepted: Orders

## 2024-03-03 ENCOUNTER — Ambulatory Visit: Payer: Self-pay | Admitting: Internal Medicine

## 2024-03-06 MED ORDER — HYOSCYAMINE SULFATE 0.125 MG SL SUBL: SUBLINGUAL_TABLET | SUBLINGUAL | 2 refills | Status: DC

## 2024-03-13 DIAGNOSIS — M25761 Osteophyte, right knee: Secondary | ICD-10-CM | POA: Diagnosis not present

## 2024-03-13 DIAGNOSIS — M1711 Unilateral primary osteoarthritis, right knee: Secondary | ICD-10-CM | POA: Diagnosis not present

## 2024-03-13 DIAGNOSIS — G8918 Other acute postprocedural pain: Secondary | ICD-10-CM | POA: Diagnosis not present

## 2024-03-15 ENCOUNTER — Other Ambulatory Visit: Payer: Self-pay | Admitting: Internal Medicine

## 2024-03-15 IMAGING — MR MR HEAD W/O CM
12 of 14 series · 33 of 48 positions shown · IV contrast (gadavist)
Comparison: No pertinent prior exam.

CLINICAL DATA: Dizziness and headache

EXAM:
MRI HEAD WITHOUT CONTRAST
MRA HEAD WITHOUT CONTRAST
MRA OF THE NECK WITHOUT AND WITH CONTRAST
TECHNIQUE: Multiplanar, multi-echo pulse sequences of the brain and surrounding
structures were acquired without intravenous contrast. Angiographic
images of the Circle of Willis were acquired using MRA technique
without intravenous contrast. Angiographic images of the neck were
acquired using MRA technique without and with intravenous contrast.
Carotid stenosis measurements (when applicable) are obtained
utilizing NASCET criteria, using the distal internal carotid
diameter as the denominator.
CONTRAST:  6.5mL GADAVIST GADOBUTROL 1 MMOL/ML IV SOLN

[Series 5: DWI · axial · 3.0mm · 0.88mm/px · z∈[-100,+42]mm · 5 of 100 slices shown (1 of 4)]
[im 1/100]
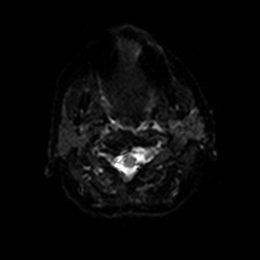
[im 25/100]
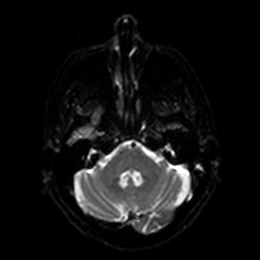
[im 50/100]
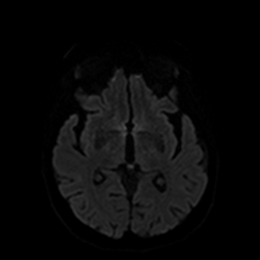
[im 75/100]
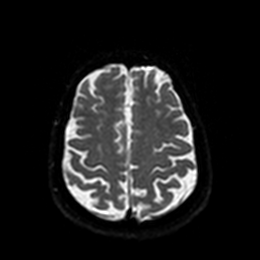
[im 100/100]
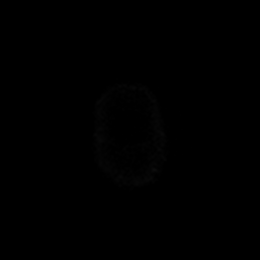

[Series 6: DWI · axial · 3.0mm · 0.88mm/px · z∈[-100,+42]mm · 3 of 50 slices shown (2 of 4)]
[im 1/50]
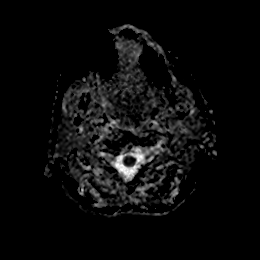
[im 25/50]
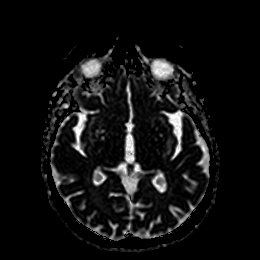
[im 50/50]
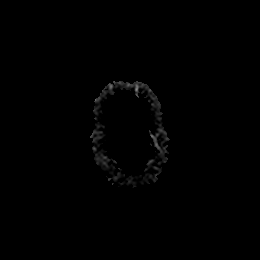

[Series 7: DWI · coronal · 4.0mm · 0.88mm/px · 4 of 68 slices shown (3 of 4)]
[im 1/68]
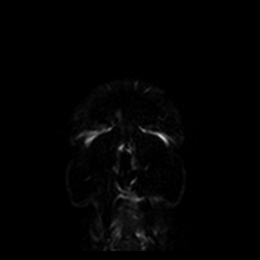
[im 23/68]
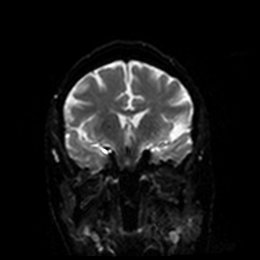
[im 45/68]
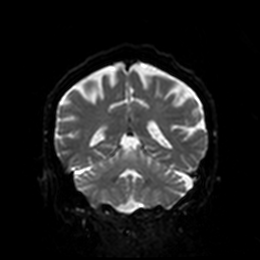
[im 68/68]
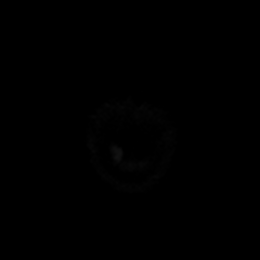

[Series 8: DWI · coronal · 4.0mm · 0.88mm/px · 2 of 33 slices shown (4 of 4)]
[im 1/33]
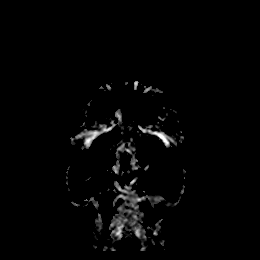
[im 33/33]
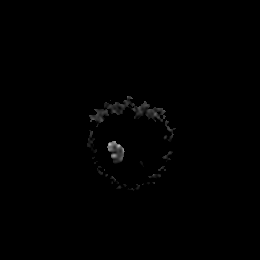

[Series 13: T1 · sagittal · 5.0mm · 0.75mm/px · 1 of 26 slices shown]
[im 1/26]
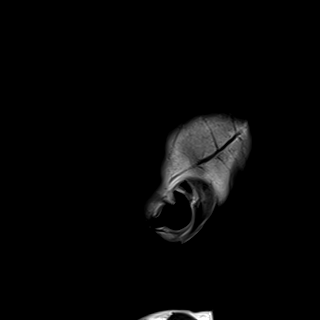

[Series 14: T2 · axial · 5.0mm · 0.72mm/px · z∈[-109,+40]mm · 2 of 27 slices shown (1 of 2)]
[im 1/27]
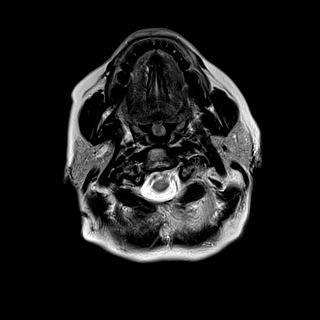
[im 27/27]
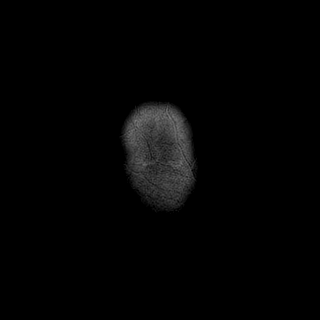

[Series 15: FLAIR · axial · 5.0mm · 0.45mm/px · z∈[-111,+38]mm · 2 of 27 slices shown]
[im 1/27]
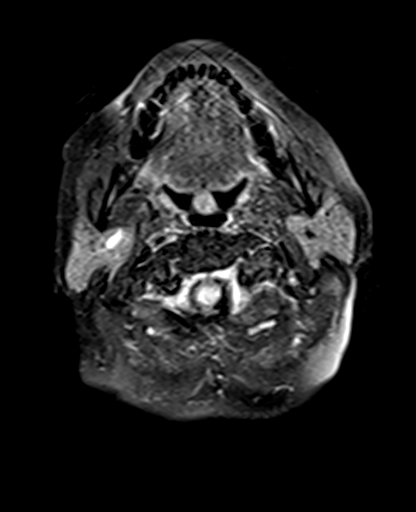
[im 27/27]
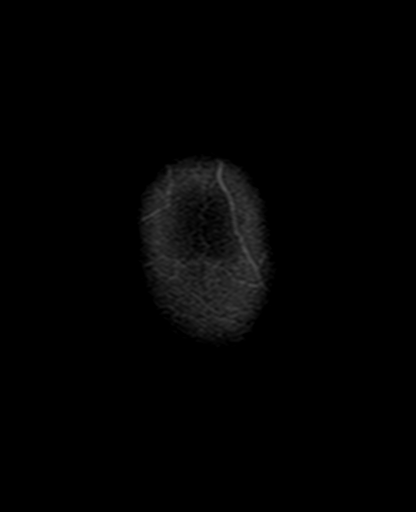

[Series 16: mag_images · axial · 3.0mm · 0.90mm/px · z∈[-121,+48]mm · 3 of 60 slices shown]
[im 1/60]
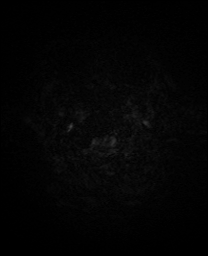
[im 30/60]
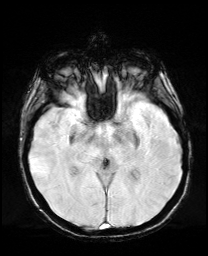
[im 60/60]
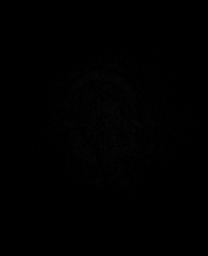

[Series 17: pha_images · axial · 3.0mm · 0.90mm/px · z∈[-121,+42]mm · 3 of 57 slices shown]
[im 1/57]
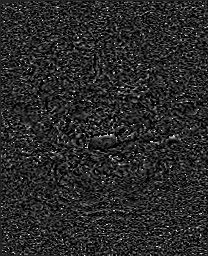
[im 29/57]
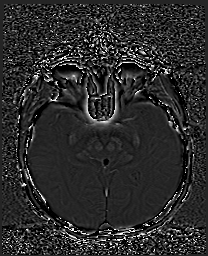
[im 57/57]
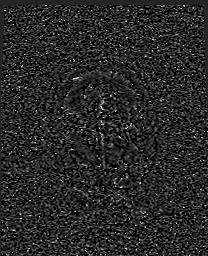

[Series 18: swi_images · axial · 3.0mm · 0.90mm/px · z∈[-121,+48]mm · 3 of 60 slices shown]
[im 1/60]
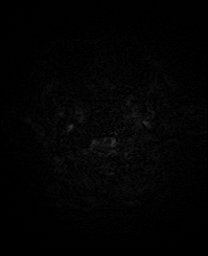
[im 30/60]
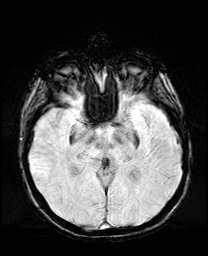
[im 60/60]
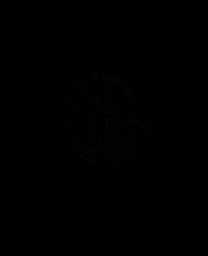

[Series 19: mip_images(sw) · axial · 24.0mm · 0.90mm/px · z∈[-111,+38]mm · 3 of 53 slices shown]
[im 1/53]
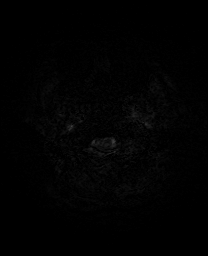
[im 27/53]
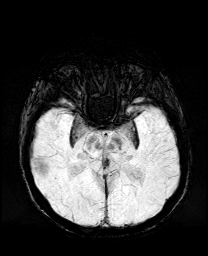
[im 53/53]
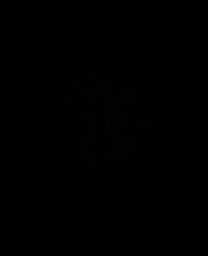

[Series 21: T2 · coronal · 5.0mm · 0.34mm/px · 2 of 29 slices shown (2 of 2)]
[im 1/29]
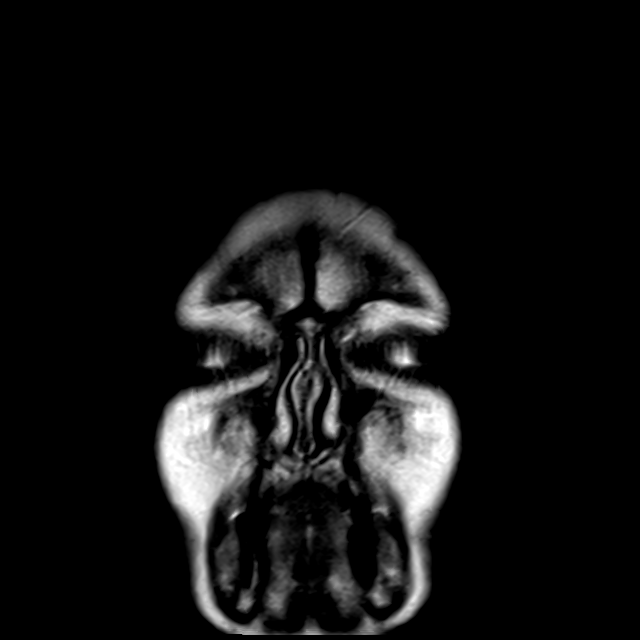
[im 29/29]
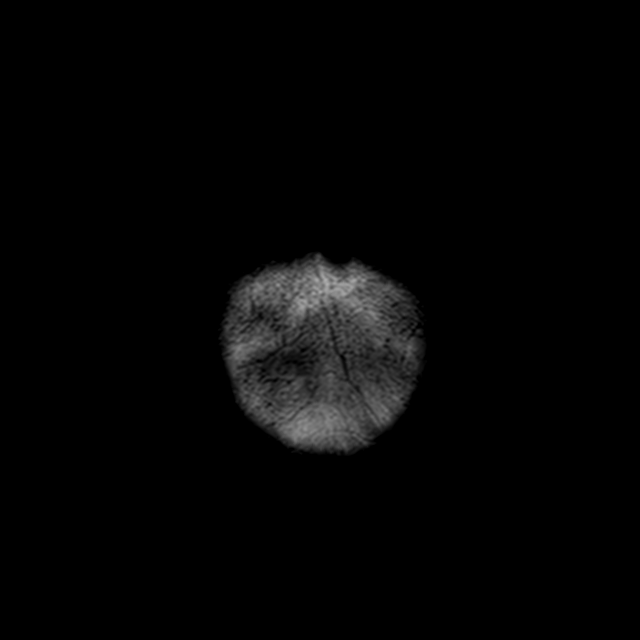

[33 of 48 positions shown; findings below may reference images not displayed]

FINDINGS: MR HEAD FINDINGS

Brain: No acute infarct, mass effect or extra-axial collection. No
acute or chronic hemorrhage. Normal white matter signal, parenchymal
volume and CSF spaces. The midline structures are normal.

Vascular: Major flow voids are preserved.

Skull and upper cervical spine: Normal calvarium and skull base.
Visualized upper cervical spine and soft tissues are normal.

Sinuses/Orbits:No paranasal sinus fluid levels or advanced mucosal
thickening. No mastoid or middle ear effusion. Normal orbits.

MRA HEAD FINDINGS

POSTERIOR CIRCULATION:

--Vertebral arteries: Normal

--Inferior cerebellar arteries: Normal.

--Basilar artery: Normal.

--Superior cerebellar arteries: Normal.

--Posterior cerebral arteries: Mild stenosis at the left P1-2
junction. Otherwise normal.

ANTERIOR CIRCULATION:

--Intracranial internal carotid arteries: Normal.

--Anterior cerebral arteries (ACA): Normal.

--Middle cerebral arteries (MCA): Normal.

ANATOMIC VARIANTS: None

MRA NECK FINDINGS

Aortic arch: Normal

Right carotid system: Normal

Left carotid system: Normal

Vertebral arteries: Codominant system.  Normal.

Other: None.
IMPRESSION: 1. Normal MRI of the brain.
2. Mild stenosis at the left PCA P1-2 junction.
3. Normal MRA of the neck.

## 2024-03-15 MED ORDER — HYOSCYAMINE SULFATE ER 0.375 MG PO TB12: 0.3750 mg | ORAL_TABLET | Freq: Two times a day (BID) | ORAL | 3 refills | Status: AC

## 2024-03-17 DIAGNOSIS — M25661 Stiffness of right knee, not elsewhere classified: Secondary | ICD-10-CM | POA: Diagnosis not present

## 2024-03-17 DIAGNOSIS — Z4789 Encounter for other orthopedic aftercare: Secondary | ICD-10-CM | POA: Diagnosis not present

## 2024-03-17 DIAGNOSIS — M25561 Pain in right knee: Secondary | ICD-10-CM | POA: Diagnosis not present

## 2024-03-21 DIAGNOSIS — Z4789 Encounter for other orthopedic aftercare: Secondary | ICD-10-CM | POA: Diagnosis not present

## 2024-03-21 DIAGNOSIS — M25661 Stiffness of right knee, not elsewhere classified: Secondary | ICD-10-CM | POA: Diagnosis not present

## 2024-03-21 DIAGNOSIS — M25561 Pain in right knee: Secondary | ICD-10-CM | POA: Diagnosis not present

## 2024-03-22 DIAGNOSIS — M25661 Stiffness of right knee, not elsewhere classified: Secondary | ICD-10-CM | POA: Diagnosis not present

## 2024-03-22 DIAGNOSIS — Z4789 Encounter for other orthopedic aftercare: Secondary | ICD-10-CM | POA: Diagnosis not present

## 2024-03-22 DIAGNOSIS — M25561 Pain in right knee: Secondary | ICD-10-CM | POA: Diagnosis not present

## 2024-03-23 DIAGNOSIS — M25561 Pain in right knee: Secondary | ICD-10-CM | POA: Diagnosis not present

## 2024-03-23 DIAGNOSIS — M25661 Stiffness of right knee, not elsewhere classified: Secondary | ICD-10-CM | POA: Diagnosis not present

## 2024-03-23 DIAGNOSIS — Z4789 Encounter for other orthopedic aftercare: Secondary | ICD-10-CM | POA: Diagnosis not present

## 2024-03-27 DIAGNOSIS — M25661 Stiffness of right knee, not elsewhere classified: Secondary | ICD-10-CM | POA: Diagnosis not present

## 2024-03-27 DIAGNOSIS — M25561 Pain in right knee: Secondary | ICD-10-CM | POA: Diagnosis not present

## 2024-03-27 DIAGNOSIS — Z4789 Encounter for other orthopedic aftercare: Secondary | ICD-10-CM | POA: Diagnosis not present

## 2024-03-28 DIAGNOSIS — Z4889 Encounter for other specified surgical aftercare: Secondary | ICD-10-CM | POA: Diagnosis not present

## 2024-03-29 DIAGNOSIS — Z4889 Encounter for other specified surgical aftercare: Secondary | ICD-10-CM | POA: Insufficient documentation

## 2024-03-30 DIAGNOSIS — M25561 Pain in right knee: Secondary | ICD-10-CM | POA: Diagnosis not present

## 2024-03-30 DIAGNOSIS — M25661 Stiffness of right knee, not elsewhere classified: Secondary | ICD-10-CM | POA: Diagnosis not present

## 2024-03-30 DIAGNOSIS — Z4789 Encounter for other orthopedic aftercare: Secondary | ICD-10-CM | POA: Diagnosis not present

## 2024-04-03 DIAGNOSIS — Z4789 Encounter for other orthopedic aftercare: Secondary | ICD-10-CM | POA: Diagnosis not present

## 2024-04-03 DIAGNOSIS — M25561 Pain in right knee: Secondary | ICD-10-CM | POA: Diagnosis not present

## 2024-04-03 DIAGNOSIS — M25661 Stiffness of right knee, not elsewhere classified: Secondary | ICD-10-CM | POA: Diagnosis not present

## 2024-04-05 DIAGNOSIS — M25561 Pain in right knee: Secondary | ICD-10-CM | POA: Diagnosis not present

## 2024-04-05 DIAGNOSIS — Z4789 Encounter for other orthopedic aftercare: Secondary | ICD-10-CM | POA: Diagnosis not present

## 2024-04-05 DIAGNOSIS — M25661 Stiffness of right knee, not elsewhere classified: Secondary | ICD-10-CM | POA: Diagnosis not present

## 2024-04-10 DIAGNOSIS — M25561 Pain in right knee: Secondary | ICD-10-CM | POA: Diagnosis not present

## 2024-04-10 DIAGNOSIS — Z4789 Encounter for other orthopedic aftercare: Secondary | ICD-10-CM | POA: Diagnosis not present

## 2024-04-10 DIAGNOSIS — M25661 Stiffness of right knee, not elsewhere classified: Secondary | ICD-10-CM | POA: Diagnosis not present

## 2024-04-16 ENCOUNTER — Encounter: Payer: Self-pay | Admitting: Internal Medicine

## 2024-04-17 ENCOUNTER — Ambulatory Visit
Admission: EM | Admit: 2024-04-17 | Discharge: 2024-04-17 | Disposition: A | Discharge status: Home / Self Care | Attending: Family Medicine | Admitting: Family Medicine

## 2024-04-17 DIAGNOSIS — I10 Essential (primary) hypertension: Secondary | ICD-10-CM | POA: Diagnosis not present

## 2024-04-17 DIAGNOSIS — M25661 Stiffness of right knee, not elsewhere classified: Secondary | ICD-10-CM | POA: Diagnosis not present

## 2024-04-17 DIAGNOSIS — M25561 Pain in right knee: Secondary | ICD-10-CM | POA: Diagnosis not present

## 2024-04-17 DIAGNOSIS — N3001 Acute cystitis with hematuria: Secondary | ICD-10-CM | POA: Diagnosis not present

## 2024-04-17 DIAGNOSIS — Z4789 Encounter for other orthopedic aftercare: Secondary | ICD-10-CM | POA: Diagnosis not present

## 2024-04-17 LAB — POCT URINE DIPSTICK
Bilirubin, UA: NEGATIVE
Glucose, UA: NEGATIVE mg/dL
Ketones, POC UA: NEGATIVE mg/dL
Nitrite, UA: POSITIVE — AB
POC PROTEIN,UA: NEGATIVE
Spec Grav, UA: 1.01 (ref 1.010–1.025)
Urobilinogen, UA: 0.2 U/dL
pH, UA: 7 (ref 5.0–8.0)

## 2024-04-17 MED ORDER — CIPROFLOXACIN HCL 500 MG PO TABS: 500.0000 mg | ORAL_TABLET | Freq: Two times a day (BID) | ORAL | 0 refills | Status: DC

## 2024-04-17 NOTE — Telephone Encounter (Signed)
 Copied from CRM #[id number]. Topic: Clinical - Request for Lab/Test Order >> Apr 17, 2024  1:47 PM Rosina BIRCH wrote: Reason for CRM: patient called wanting to see if a urinalysis was put in for her. I did let her know that I did not see one and she stated that is what she needed to know and disconnected the call

## 2024-04-17 NOTE — ED Provider Notes (Signed)
 [name]  Note:  This document was prepared using Conservation officer, historic buildings and may include unintentional dictation errors.  MRN: [id number] DOB: 18-Nov-1945  Subjective:   GENNESIS HOGLAND is a 78 y.o. female presenting for 3-day history of recurrent malodorous urine, urethral spasms.  Has a history of urinary tract infections.  Patient has significant concerns about antibiotic choice.  Last UTI she had was difficult to treat per patient.  Reports that she was initially prescribed cephalexin  and then was switched to ciprofloxacin .  Neither 1 worked for her at all and therefore ended up taking nitrofurantoin  which helped to resolve her infection.  She is requesting this today. Denies fever, n/v, abdominal pain, rashes, hematuria, vaginal discharge.     No current facility-administered medications for this encounter.  Current Outpatient Medications:    acetaminophen  (TYLENOL ) 325 MG tablet, Take 2 tablets (650 mg total) by mouth every 6 (six) hours as needed for moderate pain., Disp: 30 tablet, Rfl: 0   amLODipine  (NORVASC ) 2.5 MG tablet, TAKE 1 TABLET BY MOUTH EVERY DAY, Disp: 90 tablet, Rfl: 3   Apoaequorin (PREVAGEN) 10 MG CAPS, Take by mouth., Disp: , Rfl:    buPROPion  (WELLBUTRIN  XL) 150 MG 24 hr tablet, TAKE 1 TABLET (150 MG TOTAL) BY MOUTH DAILY. TAKE IN AM, Disp: 90 tablet, Rfl: 1   Cholecalciferol (VITAMIN D3) 50 MCG (2000 UT) capsule, Take 1 capsule (2,000 Units total) by mouth daily., Disp: 100 capsule, Rfl: 3   diclofenac  sodium (VOLTAREN ) 1 % GEL, Apply 4 g topically 4 (four) times daily., Disp: 100 Tube, Rfl: 11   enalapril  (VASOTEC ) 20 MG tablet, Take 1 tablet (20 mg total) by mouth 2 (two) times daily., Disp: 180 tablet, Rfl: 3   hyoscyamine  (LEVBID ) 0.375 MG 12 hr tablet, Take 1 tablet (0.375 mg total) by mouth 2 (two) times daily., Disp: 60 tablet, Rfl: 3   ipratropium (ATROVENT ) 0.03 % nasal spray, Place 2 sprays into both nostrils 2 (two)  times daily., Disp: 30 mL, Rfl: 2   levothyroxine  (SYNTHROID ) 25 MCG tablet, TAKE ONE TABLET BY MOUTH EVERY DAY BEFORE BREAKFAST, Disp: 90 tablet, Rfl: 3   LORazepam  (ATIVAN ) 0.5 MG tablet, TAKE 1 TABLET BY MOUTH EVERY NIGHT AT BEDTIME AS NEEDED FOR SLEEP, Disp: 30 tablet, Rfl: 3   Lutein-Zeaxanthin 25-5 MG CAPS, Take by mouth. Takes one daily, Disp: , Rfl:    meclizine  (ANTIVERT ) 12.5 MG tablet, Take 1-2 tablets (12.5-25 mg total) by mouth 3 (three) times daily as needed for dizziness., Disp: 30 tablet, Rfl: 0   meloxicam  (MOBIC ) 15 MG tablet, TAKE 1 TABLET BY MOUTH EVERY DAY AFTER SURGERY AS NEEDED, Disp: 90 tablet, Rfl: 0   methocarbamol  (ROBAXIN ) 500 MG tablet, Take 1 tablet (500 mg total) by mouth every 6 (six) hours as needed., Disp: 120 tablet, Rfl: 3   Multiple Vitamins-Minerals (MULTIVITAMIN,TX-MINERALS) tablet, Take 1 tablet by mouth daily.  , Disp: , Rfl:    NON FORMULARY, Vitamin C, Osteo BiFlex, Vitamin B12 - PO daily, Disp: , Rfl:    Omega 3 1000 MG CAPS, Take 2 capsules by mouth daily., Disp: , Rfl:    ondansetron  (ZOFRAN -ODT) 4 MG disintegrating tablet, Take 1 tablet (4 mg total) by mouth every 8 (eight) hours as needed for nausea or vomiting., Disp: 5 tablet, Rfl: 0   pantoprazole  (PROTONIX ) 40 MG tablet, TAKE 1 TABLET BY MOUTH EVERY DAY, Disp: 90 tablet, Rfl: 3   pregabalin  (LYRICA ) 150 MG capsule, Take  1 capsule (150 mg total) by mouth 2 (two) times daily., Disp: 60 capsule, Rfl: 3   promethazine -dextromethorphan (PROMETHAZINE -DM) 6.25-15 MG/5ML syrup, Take 2.5 mLs by mouth 3 (three) times daily as needed for cough., Disp: 100 mL, Rfl: 0   SUMAtriptan  (IMITREX ) 100 MG tablet, Take 1 tablet (100 mg total) by mouth every 2 (two) hours as needed for migraine. May repeat in 2 hours if headache persists or recurs., Disp: 10 tablet, Rfl: 2   traMADol  (ULTRAM ) 50 MG tablet, Take 1-2 tablets (50-100 mg total) by mouth 3 (three) times daily as needed for severe pain (pain score 7-10)., Disp:  90 tablet, Rfl: 1   triamcinolone  cream (KENALOG ) 0.5 %, Apply 1 Application topically 4 (four) times daily. On dry skin, Disp: 90 g, Rfl: 1   No Known Allergies  Past Medical History:  Diagnosis Date   Anemia, iron deficiency    Anxiety    Blood donor    Family history of coronary artery disease 06/04/2016   GERD (gastroesophageal reflux disease)    Hypertension    IBS (irritable bowel syndrome)    Osteoarthritis    Presence of pessary      Past Surgical History:  Procedure Laterality Date   APPENDECTOMY     CERVICAL FUSION  06/29/2009   C4-5 Dr Felecia   CHOLECYSTECTOMY     ESOPHAGEAL MANOMETRY N/A 11/13/2013   Procedure: ESOPHAGEAL MANOMETRY (EM);  Surgeon: Norleen LOISE Kiang, MD;  Location: [facility];  Service: Endoscopy;  Laterality: N/A;   TUBAL LIGATION      Family History  Problem Relation Age of Onset   Diabetes Mother    Uterine cancer Mother 27       uterine sarcoma   Lymphoma Mother    Dementia Mother    Hypothyroidism Mother    Parkinsonism Father    Diabetes Brother    Heart disease Brother        CAD   Heart attack Brother    Diabetes Paternal Grandmother    Heart disease Maternal Aunt    Heart attack Maternal Aunt    Colon cancer Neg Hx    Esophageal cancer Neg Hx    Rectal cancer Neg Hx    Stomach cancer Neg Hx     Social History   Tobacco Use   Smoking status: Former   Smokeless tobacco: Never  Vaping Use   Vaping status: Never Used  Substance Use Topics   Alcohol use: Yes    Alcohol/week: 1.0 standard drink of alcohol    Types: 1 Glasses of wine per week    Comment: 1x a week   Drug use: No    ROS   Objective:   Vitals: BP (!) 174/85 (BP Location: Right Arm)   Pulse 76   Temp 97.7 F (36.5 C) (Oral)   Resp 16   Physical Exam Constitutional:      General: She is not in acute distress.    Appearance: Normal appearance. She is well-developed. She is not ill-appearing, toxic-appearing or diaphoretic.  HENT:     Head:  Normocephalic and atraumatic.     Nose: Nose normal.     Mouth/Throat:     Mouth: Mucous membranes are moist.  Eyes:     General: No scleral icterus.       Right eye: No discharge.        Left eye: No discharge.     Extraocular Movements: Extraocular movements intact.     Conjunctiva/sclera: Conjunctivae normal.  Cardiovascular:     Rate and Rhythm: Normal rate.  Pulmonary:     Effort: Pulmonary effort is normal.  Abdominal:     General: Bowel sounds are normal. There is no distension.     Palpations: Abdomen is soft. There is no mass.     Tenderness: There is no abdominal tenderness. There is no right CVA tenderness, left CVA tenderness, guarding or rebound.  Skin:    General: Skin is warm and dry.  Neurological:     General: No focal deficit present.     Mental Status: She is alert and oriented to person, place, and time.  Psychiatric:        Mood and Affect: Mood normal.        Behavior: Behavior normal.        Thought Content: Thought content normal.        Judgment: Judgment normal.     Results for orders placed or performed during the hospital encounter of 04/17/24 (from the past 24 hours)  POCT URINE DIPSTICK     Status: Abnormal   Collection Time: 04/17/24  2:29 PM  Result Value Ref Range   Color, UA yellow yellow   Clarity, UA cloudy (A) clear   Glucose, UA negative negative mg/dL   Bilirubin, UA negative negative   Ketones, POC UA negative negative mg/dL   Spec Grav, UA 8.989 8.989 - 1.025   Blood, UA moderate (A) negative   pH, UA 7.0 5.0 - 8.0   POC PROTEIN,UA negative negative, trace   Urobilinogen, UA 0.2 0.2 or 1.0 E.U./dL   Nitrite, UA Positive (A) Negative   Leukocytes, UA Large (3+) (A) Negative    Assessment and Plan :   PDMP not reviewed this encounter.  1. Acute cystitis with hematuria   2. Elevated blood pressure reading with diagnosis of hypertension    Creatinine clearance calculated out to 43 mL/min - 49 mL/min.  Initially, patient was  sat on nitrofurantoin  but after careful consideration of her kidney function prefers to use this only if another antibiotic does not work.  Start ciprofloxacin  (patient refused cephalexin ) to cover for acute cystitis, urine culture pending.  Recommended consistent hydration, limiting urinary irritants. Will avoid Macrobid  due to risk on the kidneys. Counseled patient on potential for adverse effects with medications prescribed/recommended today, ER and return-to-clinic precautions discussed, patient verbalized understanding.    Christopher Savannah, [place] 04/17/24 [id number]

## 2024-04-17 NOTE — Discharge Instructions (Addendum)
 At your request, we will have you start ciprofloxacin  to address an urinary tract infection. Make sure you hydrate very well with plain water and a quantity of 64 ounces of water a day.  Please limit drinks that are considered urinary irritants such as fruit juices, soda, sweet tea, coffee, artifical sweetened drinks, energy drinks, alcohol.  These can worsen your urinary and genital symptoms but also be the source of them.  I will let you know about your urine culture results through MyChart to see if we need to prescribe or change your antibiotics based off of those results.

## 2024-04-17 NOTE — ED Triage Notes (Signed)
 Pt reports urethra spasms, bad smell in urine x 3 days.  Cephalexin  and ciprofloxacin  gave no relief gr UTI's before. Nitrofurantoin  gave relief fin the pass.

## 2024-04-20 ENCOUNTER — Ambulatory Visit (HOSPITAL_COMMUNITY): Payer: Self-pay

## 2024-04-20 DIAGNOSIS — Z4789 Encounter for other orthopedic aftercare: Secondary | ICD-10-CM | POA: Diagnosis not present

## 2024-04-20 DIAGNOSIS — M25561 Pain in right knee: Secondary | ICD-10-CM | POA: Diagnosis not present

## 2024-04-20 DIAGNOSIS — M25661 Stiffness of right knee, not elsewhere classified: Secondary | ICD-10-CM | POA: Diagnosis not present

## 2024-04-20 LAB — URINE CULTURE: Culture: >=100000 — AB

## 2024-04-21 ENCOUNTER — Telehealth: Payer: Self-pay

## 2024-04-21 ENCOUNTER — Encounter: Payer: Self-pay | Admitting: Family Medicine

## 2024-04-21 ENCOUNTER — Ambulatory Visit (INDEPENDENT_AMBULATORY_CARE_PROVIDER_SITE_OTHER): Admitting: Family Medicine

## 2024-04-21 VITALS — BP 140/70 | Temp 97.9°F | Ht 64.0 in | Wt 159.2 lb

## 2024-04-21 DIAGNOSIS — L97421 Non-pressure chronic ulcer of left heel and midfoot limited to breakdown of skin: Secondary | ICD-10-CM

## 2024-04-21 DIAGNOSIS — N3000 Acute cystitis without hematuria: Secondary | ICD-10-CM

## 2024-04-21 DIAGNOSIS — L03116 Cellulitis of left lower limb: Secondary | ICD-10-CM

## 2024-04-21 DIAGNOSIS — Z96652 Presence of left artificial knee joint: Secondary | ICD-10-CM | POA: Diagnosis not present

## 2024-04-21 MED ORDER — CEPHALEXIN 500 MG PO CAPS: 500.0000 mg | ORAL_CAPSULE | Freq: Two times a day (BID) | ORAL | 0 refills | Status: AC

## 2024-04-21 NOTE — Patient Instructions (Signed)
 I have sent in keflex  for you to take twice a day for 5 days. Please eat when you take this medication, it can upset your stomach if you do not. Please be sure to complete the course of antibiotics even if you are feeling better.   I would have you get a Statistician from [place]  Follow up with podiatry as scheduled, with us  sooner if needed.

## 2024-04-21 NOTE — Telephone Encounter (Signed)
 Patient called this morning - she has a heel ulcer that is open and red with maceration around it. She was told we did not have any openings today, so she got in with a NP at [facility] this afternoon. She also had a recent knee replacement and UTI. Hopefully they will assess and treat any infection. Scheduled patient with Dr. DELENA Ovens on Wed 10/29 at 2:45, to follow up and treat the ulcer.  Juris M scheduled the appt while I had her on the phone) Just a heads up, in case she calls over the weekend. Thanks

## 2024-04-21 NOTE — Progress Notes (Signed)
 Acute Office Visit  Subjective:     Patient ID: Haley Armstrong, female    DOB: [birth year], 78 y.o.   MRN: [id number]  No chief complaint on file.   HPI  Discussed the use of AI scribe software for clinical note transcription with the patient, who gave verbal consent to proceed.  History of Present Illness Haley Armstrong is a 78 year old female who presents with a suspected infected ulcer on her heel and a urinary tract infection (UTI).  Heel ulcer - Ulcer present for almost one year, located on the edge of the heel - Lesion is red with a lighter circle around it - Painful, especially when resting or sitting - Ulcer recurs, splits open, and heals slightly before worsening again - Triamcinolone  cream provided some improvement - Applies antibiotic ointment and covers with a Band-Aid - Wears mules and Skechers to avoid pressure on the heel and uses gel inserts for cushioning - Ulcer remains painful despite these measures, raising concern for infection - Walking is painful due to the ulcer - Uses ice for pain and swelling management  Urinary tract infection - Currently taking ciprofloxacin  for urinary tract infection - Last dose of ciprofloxacin  scheduled for today - Concerned about UTI due to recent knee replacement surgery six weeks ago  Functional status - Remains active, goes out for breakfast and grocery shopping despite pain from heel ulcer  Medication allergies - No antibiotic allergies     ROS Per HPI      Objective:    BP (!) 140/70 (BP Location: Left Arm, Patient Position: Sitting)   Temp 97.9 F (36.6 C) (Temporal)   Ht 5' 4 (1.626 m)   BMI 26.50 kg/m    Physical Exam Vitals and nursing note reviewed.  Constitutional:      General: She is not in acute distress.    Appearance: Normal appearance. She is normal weight.  HENT:     Head: Normocephalic and atraumatic.     Right Ear: External ear normal.     Left Ear: External ear normal.      Nose: Nose normal.     Mouth/Throat:     Mouth: Mucous membranes are moist.     Pharynx: Oropharynx is clear.  Eyes:     Extraocular Movements: Extraocular movements intact.     Pupils: Pupils are equal, round, and reactive to light.  Cardiovascular:     Rate and Rhythm: Normal rate and regular rhythm.     Pulses: Normal pulses.     Heart sounds: Normal heart sounds.  Pulmonary:     Effort: Pulmonary effort is normal. No respiratory distress.     Breath sounds: Normal breath sounds. No wheezing, rhonchi or rales.  Musculoskeletal:        General: Normal range of motion.     Cervical back: Normal range of motion.     Right lower leg: No edema.     Left lower leg: No edema.  Lymphadenopathy:     Cervical: No cervical adenopathy.  Neurological:     General: No focal deficit present.     Mental Status: She is alert and oriented to person, place, and time.  Psychiatric:        Mood and Affect: Mood normal.        Thought Content: Thought content normal.     No results found for any visits on 04/21/24.      Assessment & Plan:   Assessment and Plan Assessment &  Plan Chronic left heel ulcer limited to breakdown of skin, cellulitis of left lower extremity Chronic ulcer on left heel, possibly infected, with pain exacerbated by pressure. Presentation consistent with ulcer. - Start Keflex  for 5 days. - Use lidocaine  gel for pain relief. - Order heel cushion to offload pressure. - Advise use of rolled towel or foam under heel to reduce pressure.  Urinary tract infection Currently on ciprofloxacin  for UTI. Recurrent UTIs with concern for potential spread to knee replacement site, but no signs of spread. - Complete current course of ciprofloxacin .  Status post left knee replacement Six weeks post left knee replacement. No signs of infection at knee site.     No orders of the defined types were placed in this encounter.    Meds ordered this encounter  Medications    cephALEXin  (KEFLEX ) 500 MG capsule    Sig: Take 1 capsule (500 mg total) by mouth 2 (two) times daily for 5 days.    Dispense:  10 capsule    Refill:  0    Return if symptoms worsen or fail to improve.  Corean LITTIE Ku, FNP

## 2024-04-24 DIAGNOSIS — M25561 Pain in right knee: Secondary | ICD-10-CM | POA: Diagnosis not present

## 2024-04-24 DIAGNOSIS — M25661 Stiffness of right knee, not elsewhere classified: Secondary | ICD-10-CM | POA: Diagnosis not present

## 2024-04-24 DIAGNOSIS — Z4789 Encounter for other orthopedic aftercare: Secondary | ICD-10-CM | POA: Diagnosis not present

## 2024-04-26 ENCOUNTER — Ambulatory Visit (INDEPENDENT_AMBULATORY_CARE_PROVIDER_SITE_OTHER)

## 2024-04-26 ENCOUNTER — Ambulatory Visit

## 2024-04-26 DIAGNOSIS — L0390 Cellulitis, unspecified: Secondary | ICD-10-CM | POA: Diagnosis not present

## 2024-04-26 DIAGNOSIS — L97421 Non-pressure chronic ulcer of left heel and midfoot limited to breakdown of skin: Secondary | ICD-10-CM | POA: Diagnosis not present

## 2024-04-27 NOTE — Progress Notes (Signed)
 Subjective:  Patient ID: Haley Armstrong, female    DOB: 1945-08-28,  MRN: [id number]  Chief Complaint  Patient presents with   Foot Ulcer    Rm1 wound on left heel/ taking antibiotics has 1 day left/some tenderness with pressure.    Discussed the use of AI scribe software for clinical note transcription with the patient, who gave verbal consent to proceed.  History of Present Illness Haley Armstrong is a 78 year old female with Raynaud's who presents with a recurrent left heel ulcer.  The ulcer is located on the curve of the bottom of her left heel. It has been recurrent, with a recent reduction in size to about one-third after completing Cefalexin over the last week. The ulcer is sometimes painful, managed with antibiotic ointment and a Band-Aid. No drainage is observed, but it was red and surrounded by white last Thursday night. The redness has resolved.   She has a history of working on concrete floors, contributing to dryness and splitting of her feet. She elevates her foot using a recliner due to a recent knee replacement six weeks ago, which has caused some numbness in her legs.  Raynaud's affects her circulation, requiring oxygen level checks on her ear or toe. She uses a surgical stocking for her legs and Diclofenac  gel for pain in the ball of her foot. Her current medications include Cefalexin, previously Ciprofloxacin  for a UTI, and antibiotic ointment with a painkiller for the ulcer.     Review of Systems: Negative except as noted in the HPI. Denies N/V/F/Ch.  Past Medical History:  Diagnosis Date   Anemia, iron deficiency    Anxiety    Blood donor    Family history of coronary artery disease 06/04/2016   GERD (gastroesophageal reflux disease)    Hypertension    IBS (irritable bowel syndrome)    Osteoarthritis    Presence of pessary     Current Outpatient Medications:    acetaminophen  (TYLENOL ) 325 MG tablet, Take 2 tablets (650 mg total) by mouth every 6 (six)  hours as needed for moderate pain., Disp: 30 tablet, Rfl: 0   amLODipine  (NORVASC ) 2.5 MG tablet, TAKE 1 TABLET BY MOUTH EVERY DAY, Disp: 90 tablet, Rfl: 3   Apoaequorin (PREVAGEN) 10 MG CAPS, Take by mouth., Disp: , Rfl:    buPROPion  (WELLBUTRIN  XL) 150 MG 24 hr tablet, TAKE 1 TABLET (150 MG TOTAL) BY MOUTH DAILY. TAKE IN AM, Disp: 90 tablet, Rfl: 1   Cholecalciferol (VITAMIN D3) 50 MCG (2000 UT) capsule, Take 1 capsule (2,000 Units total) by mouth daily., Disp: 100 capsule, Rfl: 3   ciprofloxacin  (CIPRO ) 500 MG tablet, Take 1 tablet (500 mg total) by mouth 2 (two) times daily., Disp: 10 tablet, Rfl: 0   diclofenac  sodium (VOLTAREN ) 1 % GEL, Apply 4 g topically 4 (four) times daily., Disp: 100 Tube, Rfl: 11   enalapril  (VASOTEC ) 20 MG tablet, Take 1 tablet (20 mg total) by mouth 2 (two) times daily., Disp: 180 tablet, Rfl: 3   hyoscyamine  (LEVBID ) 0.375 MG 12 hr tablet, Take 1 tablet (0.375 mg total) by mouth 2 (two) times daily., Disp: 60 tablet, Rfl: 3   ipratropium (ATROVENT ) 0.03 % nasal spray, Place 2 sprays into both nostrils 2 (two) times daily., Disp: 30 mL, Rfl: 2   levothyroxine  (SYNTHROID ) 25 MCG tablet, TAKE ONE TABLET BY MOUTH EVERY DAY BEFORE BREAKFAST, Disp: 90 tablet, Rfl: 3   LORazepam  (ATIVAN ) 0.5 MG tablet, TAKE 1 TABLET BY MOUTH EVERY  NIGHT AT BEDTIME AS NEEDED FOR SLEEP, Disp: 30 tablet, Rfl: 3   Lutein-Zeaxanthin 25-5 MG CAPS, Take by mouth. Takes one daily, Disp: , Rfl:    meclizine  (ANTIVERT ) 12.5 MG tablet, Take 1-2 tablets (12.5-25 mg total) by mouth 3 (three) times daily as needed for dizziness., Disp: 30 tablet, Rfl: 0   meloxicam  (MOBIC ) 15 MG tablet, TAKE 1 TABLET BY MOUTH EVERY DAY AFTER SURGERY AS NEEDED, Disp: 90 tablet, Rfl: 0   methocarbamol  (ROBAXIN ) 500 MG tablet, Take 1 tablet (500 mg total) by mouth every 6 (six) hours as needed., Disp: 120 tablet, Rfl: 3   Multiple Vitamins-Minerals (MULTIVITAMIN,TX-MINERALS) tablet, Take 1 tablet by mouth daily.  , Disp: ,  Rfl:    NON FORMULARY, Vitamin C, Osteo BiFlex, Vitamin B12 - PO daily, Disp: , Rfl:    Omega 3 1000 MG CAPS, Take 2 capsules by mouth daily., Disp: , Rfl:    ondansetron  (ZOFRAN -ODT) 4 MG disintegrating tablet, Take 1 tablet (4 mg total) by mouth every 8 (eight) hours as needed for nausea or vomiting., Disp: 5 tablet, Rfl: 0   pantoprazole  (PROTONIX ) 40 MG tablet, TAKE 1 TABLET BY MOUTH EVERY DAY, Disp: 90 tablet, Rfl: 3   pregabalin  (LYRICA ) 150 MG capsule, Take 1 capsule (150 mg total) by mouth 2 (two) times daily., Disp: 60 capsule, Rfl: 3   promethazine -dextromethorphan (PROMETHAZINE -DM) 6.25-15 MG/5ML syrup, Take 2.5 mLs by mouth 3 (three) times daily as needed for cough., Disp: 100 mL, Rfl: 0   SUMAtriptan  (IMITREX ) 100 MG tablet, Take 1 tablet (100 mg total) by mouth every 2 (two) hours as needed for migraine. May repeat in 2 hours if headache persists or recurs., Disp: 10 tablet, Rfl: 2   traMADol  (ULTRAM ) 50 MG tablet, Take 1-2 tablets (50-100 mg total) by mouth 3 (three) times daily as needed for severe pain (pain score 7-10)., Disp: 90 tablet, Rfl: 1   triamcinolone  cream (KENALOG ) 0.5 %, Apply 1 Application topically 4 (four) times daily. On dry skin, Disp: 90 g, Rfl: 1  Social History   Tobacco Use  Smoking Status Former  Smokeless Tobacco Never    No Known Allergies Objective:   Constitutional Well developed. Well nourished. Oriented to person, place, and time.  Vascular Dorsalis pedis pulses non-palpable bilaterally. Posterior tibial pulses nonpalpable bilaterally. Capillary refill normal to all digits.  No cyanosis or clubbing noted. Pedal hair growth normal.  Neurologic Normal speech. Epicritic sensation to light touch grossly intact bilaterally. Negative tinel sign at tarsal tunnel bilaterally.   Dermatologic Skin texture and turgor are within normal limits.  Of the glabrous junction of the posterior heel there is a pinpoint ulceration approximately 0.2 cm x 0.2 cm  by superficial limited to the breakdown of the skin.  There is minimal surrounding erythema and edema.  There is no drainage.  Musculoskeletal: 5 out of 5 muscle strength all major pedal muscle groups, no contributing deformities   Radiographs: 3 weightbearing views of the left foot were taken.  These do not show any osseous irregularity that would be consistent with infection.  No soft tissue emphysema.  Mild pes cavus foot structure with os peroneum noted.  Significant first metatarsophalangeal joint arthritis with bone-on-bone contact.  No acute osseous findings such as fracture or dislocation.   Assessment:   1. Wound cellulitis   2. Ulcer of left heel and midfoot, limited to breakdown of skin (HCC)      Plan:  Patient was evaluated and treated and all questions  answered.  Assessment and Plan Assessment & Plan Superficial left heel ulcer Superficial, healing ulcer at posterior glabrous junction. Reduced to one-third size since taking cephalexin  over last week. No bone infection, foreign body, purulence, or non-viable tissue. Likely due to dry skin and friction. -Discussed with patient that since this is a recurrent ulceration and no biomechanical reason for repeat ulceration is identified, recommend vascular testing to see if it may be contributing. - Provide pads to offload pressure. - Recommend urea-containing lotion for skin once wound heals - Ordered ABI test to assess blood flow.  - Complete oral antibiotic, no new prescription unless infection recurs. - Schedule follow-up in two weeks or sooner if ulcer worsens.      No follow-ups on file.   Prentice Ovens, DPM AACFAS Fellowship Trained Podiatric Surgeon [facility]

## 2024-05-10 ENCOUNTER — Ambulatory Visit (HOSPITAL_COMMUNITY)
Admission: RE | Admit: 2024-05-10 | Discharge: 2024-05-10 | Disposition: A | Discharge status: Home / Self Care | Source: Ambulatory Visit

## 2024-05-10 DIAGNOSIS — L97421 Non-pressure chronic ulcer of left heel and midfoot limited to breakdown of skin: Secondary | ICD-10-CM | POA: Insufficient documentation

## 2024-05-10 DIAGNOSIS — L0390 Cellulitis, unspecified: Secondary | ICD-10-CM | POA: Diagnosis present

## 2024-05-16 ENCOUNTER — Ambulatory Visit

## 2024-05-16 DIAGNOSIS — L97421 Non-pressure chronic ulcer of left heel and midfoot limited to breakdown of skin: Secondary | ICD-10-CM | POA: Diagnosis not present

## 2024-05-16 DIAGNOSIS — L0390 Cellulitis, unspecified: Secondary | ICD-10-CM

## 2024-05-17 NOTE — Progress Notes (Signed)
 Subjective:  Patient ID: Haley Armstrong, female    DOB: 04-01-1946,  MRN: [id number]  Chief Complaint  Patient presents with   Wound Check    Lrm12 F/u wound left heel plantar/ wound look healed and doing well.    Discussed the use of AI scribe software for clinical note transcription with the patient, who gave verbal consent to proceed.  History of Present Illness 78 year old female returns to clinic for repeat evaluation of a left heel wound.  She states that it has healed.  She did have ABIs performed which did show some irregularity in the left leg.  Interval history 04/26/24: Haley Armstrong is a 78 year old female with Raynaud's who presents with a recurrent left heel ulcer.  The ulcer is located on the curve of the bottom of her left heel. It has been recurrent, with a recent reduction in size to about one-third after completing Cefalexin over the last week. The ulcer is sometimes painful, managed with antibiotic ointment and a Band-Aid. No drainage is observed, but it was red and surrounded by white last Thursday night. The redness has resolved.       Review of Systems: Negative except as noted in the HPI. Denies N/V/F/Ch.  Past Medical History:  Diagnosis Date   Anemia, iron deficiency    Anxiety    Blood donor    Family history of coronary artery disease 06/04/2016   GERD (gastroesophageal reflux disease)    Hypertension    IBS (irritable bowel syndrome)    Osteoarthritis    Presence of pessary     Current Outpatient Medications:    acetaminophen  (TYLENOL ) 325 MG tablet, Take 2 tablets (650 mg total) by mouth every 6 (six) hours as needed for moderate pain., Disp: 30 tablet, Rfl: 0   amLODipine  (NORVASC ) 2.5 MG tablet, TAKE 1 TABLET BY MOUTH EVERY DAY, Disp: 90 tablet, Rfl: 3   Apoaequorin (PREVAGEN) 10 MG CAPS, Take by mouth., Disp: , Rfl:    buPROPion  (WELLBUTRIN  XL) 150 MG 24 hr tablet, TAKE 1 TABLET (150 MG TOTAL) BY MOUTH DAILY. TAKE IN AM, Disp: 90 tablet,  Rfl: 1   Cholecalciferol (VITAMIN D3) 50 MCG (2000 UT) capsule, Take 1 capsule (2,000 Units total) by mouth daily., Disp: 100 capsule, Rfl: 3   ciprofloxacin  (CIPRO ) 500 MG tablet, Take 1 tablet (500 mg total) by mouth 2 (two) times daily., Disp: 10 tablet, Rfl: 0   diclofenac  sodium (VOLTAREN ) 1 % GEL, Apply 4 g topically 4 (four) times daily., Disp: 100 Tube, Rfl: 11   enalapril  (VASOTEC ) 20 MG tablet, Take 1 tablet (20 mg total) by mouth 2 (two) times daily., Disp: 180 tablet, Rfl: 3   hyoscyamine  (LEVBID ) 0.375 MG 12 hr tablet, Take 1 tablet (0.375 mg total) by mouth 2 (two) times daily., Disp: 60 tablet, Rfl: 3   ipratropium (ATROVENT ) 0.03 % nasal spray, Place 2 sprays into both nostrils 2 (two) times daily., Disp: 30 mL, Rfl: 2   levothyroxine  (SYNTHROID ) 25 MCG tablet, TAKE ONE TABLET BY MOUTH EVERY DAY BEFORE BREAKFAST, Disp: 90 tablet, Rfl: 3   LORazepam  (ATIVAN ) 0.5 MG tablet, TAKE 1 TABLET BY MOUTH EVERY NIGHT AT BEDTIME AS NEEDED FOR SLEEP, Disp: 30 tablet, Rfl: 3   Lutein-Zeaxanthin 25-5 MG CAPS, Take by mouth. Takes one daily, Disp: , Rfl:    meclizine  (ANTIVERT ) 12.5 MG tablet, Take 1-2 tablets (12.5-25 mg total) by mouth 3 (three) times daily as needed for dizziness., Disp: 30 tablet, Rfl: 0  meloxicam  (MOBIC ) 15 MG tablet, TAKE 1 TABLET BY MOUTH EVERY DAY AFTER SURGERY AS NEEDED, Disp: 90 tablet, Rfl: 0   methocarbamol  (ROBAXIN ) 500 MG tablet, Take 1 tablet (500 mg total) by mouth every 6 (six) hours as needed., Disp: 120 tablet, Rfl: 3   Multiple Vitamins-Minerals (MULTIVITAMIN,TX-MINERALS) tablet, Take 1 tablet by mouth daily.  , Disp: , Rfl:    NON FORMULARY, Vitamin C, Osteo BiFlex, Vitamin B12 - PO daily, Disp: , Rfl:    Omega 3 1000 MG CAPS, Take 2 capsules by mouth daily., Disp: , Rfl:    ondansetron  (ZOFRAN -ODT) 4 MG disintegrating tablet, Take 1 tablet (4 mg total) by mouth every 8 (eight) hours as needed for nausea or vomiting., Disp: 5 tablet, Rfl: 0   pantoprazole   (PROTONIX ) 40 MG tablet, TAKE 1 TABLET BY MOUTH EVERY DAY, Disp: 90 tablet, Rfl: 3   pregabalin  (LYRICA ) 150 MG capsule, Take 1 capsule (150 mg total) by mouth 2 (two) times daily., Disp: 60 capsule, Rfl: 3   promethazine -dextromethorphan (PROMETHAZINE -DM) 6.25-15 MG/5ML syrup, Take 2.5 mLs by mouth 3 (three) times daily as needed for cough., Disp: 100 mL, Rfl: 0   SUMAtriptan  (IMITREX ) 100 MG tablet, Take 1 tablet (100 mg total) by mouth every 2 (two) hours as needed for migraine. May repeat in 2 hours if headache persists or recurs., Disp: 10 tablet, Rfl: 2   traMADol  (ULTRAM ) 50 MG tablet, Take 1-2 tablets (50-100 mg total) by mouth 3 (three) times daily as needed for severe pain (pain score 7-10)., Disp: 90 tablet, Rfl: 1   triamcinolone  cream (KENALOG ) 0.5 %, Apply 1 Application topically 4 (four) times daily. On dry skin, Disp: 90 g, Rfl: 1  Social History   Tobacco Use  Smoking Status Former  Smokeless Tobacco Never    No Known Allergies Objective:   Constitutional Well developed. Well nourished. Oriented to person, place, and time.  Vascular Dorsalis pedis pulses non-palpable bilaterally. Posterior tibial pulses nonpalpable bilaterally. Capillary refill normal to all digits.  No cyanosis or clubbing noted. Pedal hair growth normal.  Neurologic Normal speech. Epicritic sensation to light touch grossly intact bilaterally. Negative tinel sign at tarsal tunnel bilaterally.   Dermatologic Skin texture and turgor are within normal limits.  Healed ulceration glabrous junction of posterior heel, left foot.  No surrounding erythema, edema or signs of infection. Cellulitis has resolved.   Musculoskeletal: 5 out of 5 muscle strength all major pedal muscle groups, no contributing deformities    Assessment:   1. Ulcer of left heel and midfoot, limited to breakdown of skin (HCC)   2. Wound cellulitis       Plan:  Patient was evaluated and treated and all questions  answered.  Assessment and Plan Assessment & Plan Superficial left heel ulcer -Healed, patient doing very well -ABI did show irregularity in the left lower extremity.  We discussed possible vascular referral, she would like to hold off on this at this time. -Antibiotics no longer required -Utilize urea lotion to decrease chance of hyperkeratotic buildup leading to ulceration   Return to clinic as needed   Prentice Ovens, DPM AACFAS Fellowship Trained Podiatric Surgeon [facility]

## 2024-05-19 ENCOUNTER — Other Ambulatory Visit: Payer: Self-pay | Admitting: Internal Medicine

## 2024-05-30 ENCOUNTER — Encounter: Payer: Self-pay | Admitting: Internal Medicine

## 2024-05-30 ENCOUNTER — Ambulatory Visit: Admitting: Internal Medicine

## 2024-05-30 VITALS — BP 156/68 | HR 77 | Temp 97.8°F | Ht 64.0 in | Wt 161.0 lb

## 2024-05-30 DIAGNOSIS — K219 Gastro-esophageal reflux disease without esophagitis: Secondary | ICD-10-CM

## 2024-05-30 DIAGNOSIS — R1310 Dysphagia, unspecified: Secondary | ICD-10-CM | POA: Diagnosis not present

## 2024-05-30 DIAGNOSIS — R058 Other specified cough: Secondary | ICD-10-CM | POA: Diagnosis not present

## 2024-05-30 DIAGNOSIS — R635 Abnormal weight gain: Secondary | ICD-10-CM | POA: Diagnosis not present

## 2024-05-30 DIAGNOSIS — T464X5A Adverse effect of angiotensin-converting-enzyme inhibitors, initial encounter: Secondary | ICD-10-CM

## 2024-05-30 MED ORDER — OLMESARTAN MEDOXOMIL-HCTZ 20-12.5 MG PO TABS: 1.0000 | ORAL_TABLET | Freq: Every day | ORAL | 3 refills | Status: DC

## 2024-05-30 MED ORDER — PANTOPRAZOLE SODIUM 40 MG PO TBEC: 40.0000 mg | DELAYED_RELEASE_TABLET | Freq: Two times a day (BID) | ORAL | 3 refills | Status: AC

## 2024-05-30 NOTE — Assessment & Plan Note (Signed)
 GERD, pills, food get stuck at times since surgery Increase Protonix  to 40 mg bid GI ref - Dr Abran

## 2024-05-30 NOTE — Progress Notes (Signed)
 Subjective:  Patient ID: Haley Armstrong, female    DOB: [birth year]  Age: 78 y.o. MRN: [id number]  CC: Medical Management of Chronic Issues (3 Month follow up. New weight gain. Slight edema on right leg, did have total knee replacement )   HPI [name] presents for chronic cough - worse, weight gain, leg edema S/p R TKR  03/13/2024 C/o GERD, pills get stuck at times since surgery  On Protonix  qhs  Outpatient Medications Prior to Visit  Medication Sig Dispense Refill   acetaminophen  (TYLENOL ) 325 MG tablet Take 2 tablets (650 mg total) by mouth every 6 (six) hours as needed for moderate pain. 30 tablet 0   amLODipine  (NORVASC ) 2.5 MG tablet TAKE 1 TABLET BY MOUTH EVERY DAY 90 tablet 3   Apoaequorin (PREVAGEN) 10 MG CAPS Take by mouth.     buPROPion  (WELLBUTRIN  XL) 150 MG 24 hr tablet TAKE 1 TABLET (150 MG TOTAL) BY MOUTH DAILY. TAKE IN AM 90 tablet 1   Cholecalciferol (VITAMIN D3) 50 MCG (2000 UT) capsule Take 1 capsule (2,000 Units total) by mouth daily. 100 capsule 3   diclofenac  sodium (VOLTAREN ) 1 % GEL Apply 4 g topically 4 (four) times daily. 100 Tube 11   hyoscyamine  (LEVBID ) 0.375 MG 12 hr tablet Take 1 tablet (0.375 mg total) by mouth 2 (two) times daily. 60 tablet 3   ipratropium (ATROVENT ) 0.03 % nasal spray Place 2 sprays into both nostrils 2 (two) times daily. 30 mL 2   levothyroxine  (SYNTHROID ) 25 MCG tablet TAKE ONE TABLET BY MOUTH EVERY DAY BEFORE BREAKFAST 90 tablet 3   LORazepam  (ATIVAN ) 0.5 MG tablet TAKE 1 TABLET BY MOUTH EVERY NIGHT AT BEDTIME AS NEEDED FOR SLEEP 30 tablet 3   Lutein-Zeaxanthin 25-5 MG CAPS Take by mouth. Takes one daily     meclizine  (ANTIVERT ) 12.5 MG tablet Take 1-2 tablets (12.5-25 mg total) by mouth 3 (three) times daily as needed for dizziness. 30 tablet 0   meloxicam  (MOBIC ) 15 MG tablet TAKE 1 TABLET BY MOUTH EVERY DAY AFTER SURGERY AS NEEDED 90 tablet 0   methocarbamol  (ROBAXIN ) 500 MG tablet Take 1 tablet (500 mg total) by mouth  every 6 (six) hours as needed. 120 tablet 3   Multiple Vitamins-Minerals (MULTIVITAMIN,TX-MINERALS) tablet Take 1 tablet by mouth daily.       NON FORMULARY Vitamin C, Osteo BiFlex, Vitamin B12 - PO daily     Omega 3 1000 MG CAPS Take 2 capsules by mouth daily.     ondansetron  (ZOFRAN -ODT) 4 MG disintegrating tablet Take 1 tablet (4 mg total) by mouth every 8 (eight) hours as needed for nausea or vomiting. 5 tablet 0   pregabalin  (LYRICA ) 150 MG capsule Take 1 capsule (150 mg total) by mouth 2 (two) times daily. 60 capsule 3   promethazine -dextromethorphan (PROMETHAZINE -DM) 6.25-15 MG/5ML syrup Take 2.5 mLs by mouth 3 (three) times daily as needed for cough. 100 mL 0   SUMAtriptan  (IMITREX ) 100 MG tablet Take 1 tablet (100 mg total) by mouth every 2 (two) hours as needed for migraine. May repeat in 2 hours if headache persists or recurs. 10 tablet 2   traMADol  (ULTRAM ) 50 MG tablet Take 1-2 tablets (50-100 mg total) by mouth 3 (three) times daily as needed for severe pain (pain score 7-10). 90 tablet 1   triamcinolone  cream (KENALOG ) 0.5 % Apply 1 Application topically 4 (four) times daily. On dry skin 90 g 1   ciprofloxacin  (CIPRO ) 500 MG tablet Take  1 tablet (500 mg total) by mouth 2 (two) times daily. 10 tablet 0   enalapril  (VASOTEC ) 20 MG tablet Take 1 tablet (20 mg total) by mouth 2 (two) times daily. 180 tablet 3   pantoprazole  (PROTONIX ) 40 MG tablet TAKE 1 TABLET BY MOUTH EVERY DAY 90 tablet 3   No facility-administered medications prior to visit.    ROS: Review of Systems  Constitutional:  Positive for unexpected weight change. Negative for activity change, appetite change, chills and fatigue.  HENT:  Negative for congestion, mouth sores and sinus pressure.   Eyes:  Negative for visual disturbance.  Respiratory:  Positive for cough. Negative for chest tightness.   Cardiovascular:  Positive for leg swelling. Negative for chest pain.  Gastrointestinal:  Negative for abdominal pain,  constipation and nausea.  Genitourinary:  Negative for difficulty urinating, frequency and vaginal pain.  Musculoskeletal:  Positive for arthralgias and gait problem. Negative for back pain.  Skin:  Negative for pallor and rash.  Neurological:  Negative for dizziness, tremors, weakness, numbness and headaches.  Psychiatric/Behavioral:  Negative for confusion and sleep disturbance.     Objective:  BP (!) 156/68   Pulse 77   Temp 97.8 F (36.6 C)   Ht 5' 4 (1.626 m)   Wt 161 lb (73 kg)   SpO2 98%   BMI 27.64 kg/m   BP Readings from Last 3 Encounters:  05/30/24 (!) 156/68  04/21/24 (!) 140/70  04/17/24 (!) 174/85    Wt Readings from Last 3 Encounters:  05/30/24 161 lb (73 kg)  04/21/24 159 lb 3.2 oz (72.2 kg)  02/29/24 154 lb 6.4 oz (70 kg)    Physical Exam Constitutional:      General: She is not in acute distress.    Appearance: She is well-developed. She is obese.  HENT:     Head: Normocephalic.     Right Ear: External ear normal.     Left Ear: External ear normal.     Nose: Nose normal.  Eyes:     General:        Right eye: No discharge.        Left eye: No discharge.     Conjunctiva/sclera: Conjunctivae normal.     Pupils: Pupils are equal, round, and reactive to light.  Neck:     Thyroid : No thyromegaly.     Vascular: No JVD.     Trachea: No tracheal deviation.  Cardiovascular:     Rate and Rhythm: Normal rate and regular rhythm.     Heart sounds: Normal heart sounds.  Pulmonary:     Effort: No respiratory distress.     Breath sounds: No stridor. No wheezing.  Abdominal:     General: Bowel sounds are normal. There is no distension.     Palpations: Abdomen is soft. There is no mass.     Tenderness: There is no abdominal tenderness. There is no guarding or rebound.  Musculoskeletal:        General: No tenderness.     Cervical back: Normal range of motion and neck supple. No rigidity.  Lymphadenopathy:     Cervical: No cervical adenopathy.  Skin:     Findings: No erythema or rash.  Neurological:     Cranial Nerves: No cranial nerve deficit.     Motor: No abnormal muscle tone.     Coordination: Coordination normal.     Deep Tendon Reflexes: Reflexes normal.  Psychiatric:        Behavior: Behavior normal.  Thought Content: Thought content normal.        Judgment: Judgment normal.   R knee w/post-op swelling  Lab Results  Component Value Date   WBC 5.8 02/07/2024   HGB 13.4 02/07/2024   HCT 40.3 02/07/2024   PLT 308.0 02/07/2024   GLUCOSE 91 02/07/2024   CHOL 188 02/07/2024   TRIG 85.0 02/07/2024   HDL 69.40 02/07/2024   LDLCALC 102 (H) 02/07/2024   ALT 16 02/07/2024   AST 20 02/07/2024   NA 137 02/07/2024   K 5.7 No hemolysis seen (H) 02/07/2024   CL 100 02/07/2024   CREATININE 1.05 02/07/2024   BUN 18 02/07/2024   CO2 30 02/07/2024   TSH 4.16 02/29/2024   INR 0.90 08/05/2009   HGBA1C 5.8 05/24/2023    VAS US  ABI WITH/WO TBI Result Date: 05/10/2024  LOWER EXTREMITY DOPPLER STUDY Patient Name:  VASILIA DISE  Date of Exam:   05/10/2024 Medical Rec #: [id number]         Accession #:    [id number] Date of Birth: 12-05-1945         Patient Gender: F Patient Age:   84 years Exam Location:  [address] Procedure:      VAS US  ABI WITH/WO TBI Referring Phys: PRENTICE REGAL --------------------------------------------------------------------------------  Indications: Patient presents with superficial healing ulcer of the left heel.              She endorses claudication symptoms in the left calf when she is              exercising/walking on the treadmill High Risk Factors: Hypertension, past history of smoking.  Performing Technologist: Edsel Mustard RVT  Examination Guidelines: A complete evaluation includes at minimum, Doppler waveform signals and systolic blood pressure reading at the level of bilateral brachial, anterior tibial, and posterior tibial arteries, when vessel segments are accessible. Bilateral testing is  considered an integral part of a complete examination. Photoelectric Plethysmograph (PPG) waveforms and toe systolic pressure readings are included as required and additional duplex testing as needed. Limited examinations for reoccurring indications may be performed as noted.  ABI Findings: +---------+------------------+-----+---------+--------+ Right    Rt Pressure (mmHg)IndexWaveform Comment  +---------+------------------+-----+---------+--------+ Brachial 171                                      +---------+------------------+-----+---------+--------+ PTA      175               1.01 triphasic         +---------+------------------+-----+---------+--------+ DP       168               0.97 biphasic          +---------+------------------+-----+---------+--------+ Great Toe118               0.68 Abnormal          +---------+------------------+-----+---------+--------+ +---------+------------------+-----+----------+-------+ Left     Lt Pressure (mmHg)IndexWaveform  Comment +---------+------------------+-----+----------+-------+ Brachial 174                                      +---------+------------------+-----+----------+-------+ PTA      154               0.89 monophasic        +---------+------------------+-----+----------+-------+ DP  148               0.85 monophasic        +---------+------------------+-----+----------+-------+ Great Toe87                0.50 Abnormal          +---------+------------------+-----+----------+-------+  Summary: Right: Resting right ankle-brachial index is within normal range. The right toe-brachial index is abnormal. Right toe pressure is >60 mmHg which suggests adequate perfusion for healing. Left: Resting left ankle-brachial index indicates mild left lower extremity arterial disease. The left toe-brachial index is abnormal. Left toe pressure is >60 mmHg which suggests adequate perfusion for healing. *See table(s)  above for measurements and observations.  Suggest Peripheral Vascular Consult. Electronically signed by Penne Colorado MD on 05/10/2024 at 4:10:48 PM.    Final     Assessment & Plan:   Problem List Items Addressed This Visit     Cough due to ACE inhibitor   Worse. Most likely due to Enalapril  D/c Enalapril  Start OLmesartan HCT      Dysphagia - Primary   GERD, pills, food get stuck at times since surgery 02/2024 Increase Protonix  to 40 mg bid GI ref - Dr Abran      Relevant Orders   Ambulatory referral to Gastroenterology   GERD   GERD, pills, food get stuck at times since surgery Increase Protonix  to 40 mg bid GI ref - Dr Abran      Relevant Medications   pantoprazole  (PROTONIX ) 40 MG tablet   Weight gain   Check TSH Post TKR         Meds ordered this encounter  Medications   olmesartan-hydrochlorothiazide (BENICAR HCT) 20-12.5 MG tablet    Sig: Take 1 tablet by mouth daily.    Dispense:  90 tablet    Refill:  3   pantoprazole  (PROTONIX ) 40 MG tablet    Sig: Take 1 tablet (40 mg total) by mouth 2 (two) times daily.    Dispense:  180 tablet    Refill:  3      Follow-up: Return in about 6 weeks (around 07/11/2024) for a follow-up visit.  Marolyn Noel, MD

## 2024-05-30 NOTE — Patient Instructions (Signed)
 Stop Enalapril  Start OLmesartan HCT

## 2024-05-30 NOTE — Assessment & Plan Note (Signed)
 GERD, pills, food get stuck at times since surgery 02/2024 Increase Protonix  to 40 mg bid GI ref - Dr Abran

## 2024-05-30 NOTE — Assessment & Plan Note (Signed)
 Worse. Most likely due to Enalapril  D/c Enalapril  Start OLmesartan HCT

## 2024-05-30 NOTE — Assessment & Plan Note (Signed)
 Check TSH Post TKR

## 2024-06-02 ENCOUNTER — Encounter: Payer: Self-pay | Admitting: Internal Medicine

## 2024-06-20 ENCOUNTER — Other Ambulatory Visit: Payer: Self-pay | Admitting: Internal Medicine

## 2024-06-29 ENCOUNTER — Other Ambulatory Visit: Payer: Self-pay | Admitting: Internal Medicine

## 2024-06-29 MED ORDER — OLMESARTAN MEDOXOMIL-HCTZ 40-12.5 MG PO TABS: 1.0000 | ORAL_TABLET | Freq: Every day | ORAL | 3 refills | Status: AC

## 2024-06-30 ENCOUNTER — Ambulatory Visit: Payer: Self-pay

## 2024-06-30 NOTE — Telephone Encounter (Signed)
 FYI Only or Action Required?: FYI only for provider: [facility].  Patient was last seen in primary care on 05/30/2024 by [name], Haley GAILS, MD.  Called Nurse Triage reporting Hip Pain.  Symptoms began today.  Interventions attempted: Nothing.  Symptoms are: unchanged.  Triage Disposition: See HCP Within 4 Hours (Or PCP Triage)  Patient/caregiver understands and will follow disposition?: Yes   Copied from CRM [id number]. Topic: Clinical - Red Word Triage >> Jun 30, 2024 12:50 PM Susanna ORN wrote: Red Word that prompted transfer to Nurse Triage: Patient states she was sitting in the floor and she's having excruciating pain that's going down in her pelvis. Wants to know if she needs to go get an xray done or go to urgent care. Reason for Disposition  [1] SEVERE pain (e.g., excruciating, unable to do any normal activities) AND [2] not improved after 2 hours of pain medicine  Answer Assessment - Initial Assessment Questions Pt states she was kneeling on the floor, bent over to pull stuff out from under her bed and began to have an excruciating pain that went from above her hip joint down her left let. She states she fell years ago and has a knot still there but they told her it wasn't broken. She states she can walk, it is just very painful to do so. She states sitting does help slightly. Due to schedule, RN recommended [facility]. Pt agreed. Pt denies any higher acuity symptoms.     1. LOCATION and RADIATION: Where is the pain located? Does the pain spread (shoot) anywhere else?     Left hip, radiates down left leg 2. QUALITY: What does the pain feel like?  (e.g., sharp, dull, aching, burning)     sharp 3. SEVERITY: How bad is the pain? What does it keep you from doing?   (Scale 1-10; or mild, moderate, severe)     10 4. ONSET: When did the pain start? Does it come and go, or is it there all the time?     Prior to calling 5. WORK OR EXERCISE: Has there been any recent  work or exercise that involved this part of the body?      See triage note 6. CAUSE: What do you think is causing the hip pain?      See triage note 7. AGGRAVATING FACTORS: What makes the hip pain worse? (e.g., walking, climbing stairs, running)     Walking does make it worse 8. OTHER SYMPTOMS: Do you have any other symptoms? (e.g., back pain, pain shooting down leg,  fever, rash)     Denies any back pain  Protocols used: Hip Pain-A-AH

## 2024-07-03 ENCOUNTER — Other Ambulatory Visit: Payer: Self-pay | Admitting: Internal Medicine

## 2024-07-04 NOTE — Telephone Encounter (Signed)
 Please schedule office visit with any provider.  Thank you

## 2024-07-04 NOTE — Telephone Encounter (Signed)
 Please schedule pt with ANY provider as pcp has stated the following Please schedule office visit with any provider.  Thank you  **Tried to reach pt for the above reasons.

## 2024-07-08 ENCOUNTER — Encounter: Payer: Self-pay | Admitting: Internal Medicine

## 2024-07-10 ENCOUNTER — Encounter: Payer: Self-pay | Admitting: *Deleted

## 2024-07-12 ENCOUNTER — Ambulatory Visit: Admitting: Gastroenterology

## 2024-07-13 DIAGNOSIS — M1612 Unilateral primary osteoarthritis, left hip: Secondary | ICD-10-CM | POA: Insufficient documentation

## 2024-07-13 DIAGNOSIS — S73199A Other sprain of unspecified hip, initial encounter: Secondary | ICD-10-CM | POA: Insufficient documentation

## 2024-07-17 ENCOUNTER — Telehealth: Payer: Self-pay

## 2024-07-17 NOTE — Telephone Encounter (Signed)
 Copied from CRM [id number]. Topic: Referral - Question >> Jul 14, 2024  4:31 PM Vena HERO wrote: Reason for CRM: Pt called in because she went to [facility]  01/[facility] and has had an MRI and is now scheduled for injections. Injections to take at [facility] and they told pt that she needs a referral by 08/27/24 to cover her treatment and MRI. Please call pt to follow up and advise. She has an upcoming appt on 02/03 with Dr Garald.

## 2024-07-18 ENCOUNTER — Ambulatory Visit: Admitting: Internal Medicine

## 2024-07-28 ENCOUNTER — Encounter: Payer: Self-pay | Admitting: Internal Medicine

## 2024-07-28 ENCOUNTER — Telehealth: Payer: Self-pay

## 2024-07-28 NOTE — Telephone Encounter (Signed)
 Copied from CRM #[id number]. Topic: Clinical - Medication Question >> Jul 28, 2024 12:47 PM Drema MATSU wrote: Reason for CRM: Pt is positive for UTI on the test strip. She is requesting Antibiotic today to beat the weather. She states that she has a fishy smell only when urinating and the urge to urinate and can't.

## 2024-08-01 ENCOUNTER — Ambulatory Visit: Payer: Self-pay

## 2024-08-01 ENCOUNTER — Telehealth: Admitting: Internal Medicine

## 2024-08-01 ENCOUNTER — Ambulatory Visit: Admitting: Internal Medicine

## 2024-08-01 ENCOUNTER — Encounter: Payer: Self-pay | Admitting: Internal Medicine

## 2024-08-01 DIAGNOSIS — R300 Dysuria: Secondary | ICD-10-CM

## 2024-08-01 DIAGNOSIS — N390 Urinary tract infection, site not specified: Secondary | ICD-10-CM | POA: Insufficient documentation

## 2024-08-01 DIAGNOSIS — N3000 Acute cystitis without hematuria: Secondary | ICD-10-CM

## 2024-08-01 DIAGNOSIS — S73192S Other sprain of left hip, sequela: Secondary | ICD-10-CM

## 2024-08-01 DIAGNOSIS — M25552 Pain in left hip: Secondary | ICD-10-CM | POA: Insufficient documentation

## 2024-08-01 MED ORDER — NITROFURANTOIN MONOHYD MACRO 100 MG PO CAPS: 100.0000 mg | ORAL_CAPSULE | Freq: Two times a day (BID) | ORAL | 0 refills | Status: AC

## 2024-08-01 NOTE — Progress Notes (Signed)
 Virtual Visit via Video Note  I connected with Haley Armstrong on 08/01/24 at 10:00 AM EST by a video enabled telemedicine application and verified that I am speaking with the correct person using two identifiers.   I discussed the limitations of evaluation and management by telemedicine and the availability of in person appointments. The patient expressed understanding and agreed to proceed.  I was located at our [facility] office. The patient was at home. There was no one else present in the visit.  Chief Complaint  Patient presents with   Medical Management of Chronic Issues    6 week f/u     History of Present Illness:  UTI sx's since Friday. Home test was (+) for a UTI Macrobid  helped in the past, Keflex  and Cipro  did not help.  Needs a ref to [facility] - torn L hip ligament on the MRI  Review of Systems  Constitutional:  Negative for chills, diaphoresis and fever.  Respiratory:  Negative for cough.   Cardiovascular:  Negative for chest pain.  Gastrointestinal:  Negative for diarrhea.  Genitourinary:  Positive for dysuria, frequency and urgency. Negative for flank pain and hematuria.  Musculoskeletal:  Positive for joint pain. Negative for falls.  Neurological:  Negative for tremors, sensory change, focal weakness and weakness.  Endo/Heme/Allergies:  Negative for polydipsia.  Psychiatric/Behavioral:  Negative for memory loss.      Observations/Objective: The patient appears to be in no acute distress  Assessment and Plan:  Problem List Items Addressed This Visit     Dysuria   Macrobid  helped in the past, Keflex  and Cipro  did not help. Rx for Macrobid  was sent      Acetabular labrum tear   Needs a ref to [facility] - torn L hip ligament on the MRI. Will do      Relevant Orders   Ambulatory referral to Orthopedic Surgery   UTI (urinary tract infection) - Primary   Macrobid  helped in the past, Keflex  and Cipro  did not help. Rx for Macrobid  was  sent      Relevant Medications   nitrofurantoin , macrocrystal-monohydrate, (MACROBID ) 100 MG capsule   Pain of left hip    Ref was made to [facility] Voltaren  gel      Relevant Orders   Ambulatory referral to Orthopedic Surgery     Meds ordered this encounter  Medications   nitrofurantoin , macrocrystal-monohydrate, (MACROBID ) 100 MG capsule    Sig: Take 1 capsule (100 mg total) by mouth 2 (two) times daily for 7 days.    Dispense:  14 capsule    Refill:  0     Follow Up Instructions:    I discussed the assessment and treatment plan with the patient. The patient was provided an opportunity to ask questions and all were answered. The patient agreed with the plan and demonstrated an understanding of the instructions.   The patient was advised to call back or seek an in-person evaluation if the symptoms worsen or if the condition fails to improve as anticipated.  I provided face-to-face time during this encounter. We were at different locations.   Marolyn Noel, MD

## 2024-08-01 NOTE — Assessment & Plan Note (Signed)
"   Ref was made to [facility] Voltaren  gel "

## 2024-08-01 NOTE — Assessment & Plan Note (Signed)
 Macrobid  helped in the past, Keflex  and Cipro  did not help. Rx for Macrobid  was sent

## 2024-08-01 NOTE — Telephone Encounter (Signed)
 FYI Only or Action Required?: FYI only for provider: appointment scheduled on 2/3.  Patient was last seen in primary care on 05/30/2024 by [name], Karlynn GAILS, MD.  Called Nurse Triage reporting Appointment.  Symptoms began several days ago.  Interventions attempted: Nothing.  Symptoms are: unchanged.  Triage Disposition: See PCP Within 2 Weeks  Patient/caregiver understands and will follow disposition?: Yes      Message from [facility] sent at 08/01/2024  8:55 AM EST  Reason for Triage: Patient has an appt today wanting to make it virtual if possible, patient took a UTI test strip came out positive states she having spasms, odor as well   Reason for Disposition  Requesting regular office appointment  Answer Assessment - Initial Assessment Questions 1. REASON FOR CALL: What is the main reason for your call? or How can I best help you?   Patient calling in to change her in-person appt. To virtual today due to the weather/ice in her area. She stated UTI test strip came out positive last Friday. She having spasms, odor as well. Patient states she has a history of UTI's that take multiple rounds of antibiotics. Appt. Changed to virtual per her request.  Protocols used: Information Only Call - No Triage-A-AH

## 2024-08-01 NOTE — Assessment & Plan Note (Signed)
 Needs a ref to [facility] - torn L hip ligament on the MRI. Will do

## 2024-08-07 ENCOUNTER — Ambulatory Visit: Admitting: Physician Assistant

## 2024-12-08 ENCOUNTER — Ambulatory Visit
